# Patient Record
Sex: Male | Born: 1945 | Race: White | Hispanic: No | Marital: Married | Smoking: Former smoker
Health system: Southern US, Community
[De-identification: ages and names within clinical notes are randomized; demographics above are authoritative.]

## PROBLEM LIST (undated history)

## (undated) DIAGNOSIS — J40 Bronchitis, not specified as acute or chronic: Secondary | ICD-10-CM

## (undated) DIAGNOSIS — M1990 Unspecified osteoarthritis, unspecified site: Secondary | ICD-10-CM

## (undated) DIAGNOSIS — R7303 Prediabetes: Secondary | ICD-10-CM

## (undated) DIAGNOSIS — T4145XA Adverse effect of unspecified anesthetic, initial encounter: Secondary | ICD-10-CM

## (undated) DIAGNOSIS — R079 Chest pain, unspecified: Secondary | ICD-10-CM

## (undated) DIAGNOSIS — E669 Obesity, unspecified: Secondary | ICD-10-CM

## (undated) DIAGNOSIS — M5030 Other cervical disc degeneration, unspecified cervical region: Secondary | ICD-10-CM

## (undated) DIAGNOSIS — F32A Depression, unspecified: Secondary | ICD-10-CM

## (undated) DIAGNOSIS — I341 Nonrheumatic mitral (valve) prolapse: Secondary | ICD-10-CM

## (undated) DIAGNOSIS — J45909 Unspecified asthma, uncomplicated: Secondary | ICD-10-CM

## (undated) DIAGNOSIS — I493 Ventricular premature depolarization: Secondary | ICD-10-CM

## (undated) DIAGNOSIS — G4730 Sleep apnea, unspecified: Secondary | ICD-10-CM

## (undated) DIAGNOSIS — J069 Acute upper respiratory infection, unspecified: Secondary | ICD-10-CM

## (undated) DIAGNOSIS — F329 Major depressive disorder, single episode, unspecified: Secondary | ICD-10-CM

## (undated) DIAGNOSIS — I959 Hypotension, unspecified: Secondary | ICD-10-CM

## (undated) DIAGNOSIS — M5020 Other cervical disc displacement, unspecified cervical region: Secondary | ICD-10-CM

## (undated) DIAGNOSIS — R251 Tremor, unspecified: Secondary | ICD-10-CM

## (undated) DIAGNOSIS — N400 Enlarged prostate without lower urinary tract symptoms: Secondary | ICD-10-CM

## (undated) DIAGNOSIS — G4700 Insomnia, unspecified: Secondary | ICD-10-CM

## (undated) HISTORY — DX: Obesity, unspecified: E66.9

## (undated) HISTORY — PX: COLONOSCOPY W/ POLYPECTOMY: SHX1380

## (undated) HISTORY — PX: JOINT REPLACEMENT: SHX530

## (undated) HISTORY — PX: POLYPECTOMY: SHX149

## (undated) HISTORY — DX: Tremor, unspecified: R25.1

## (undated) HISTORY — PX: KNEE ARTHROTOMY: SUR107

## (undated) HISTORY — PX: CARDIAC CATHETERIZATION: SHX172

## (undated) HISTORY — DX: Ventricular premature depolarization: I49.3

## (undated) HISTORY — DX: Unspecified asthma, uncomplicated: J45.909

## (undated) HISTORY — DX: Nonrheumatic mitral (valve) prolapse: I34.1

## (undated) HISTORY — DX: Chest pain, unspecified: R07.9

## (undated) HISTORY — PX: KNEE ARTHROSCOPY: SUR90

---

## 1995-08-03 ENCOUNTER — Encounter: Payer: Self-pay | Admitting: Internal Medicine

## 1995-10-16 ENCOUNTER — Encounter: Payer: Self-pay | Admitting: Internal Medicine

## 2001-03-12 ENCOUNTER — Encounter: Admission: RE | Admit: 2001-03-12 | Discharge: 2001-06-10 | Payer: Self-pay | Admitting: Endocrinology

## 2002-04-23 ENCOUNTER — Encounter: Payer: Self-pay | Admitting: Orthopedic Surgery

## 2002-04-29 ENCOUNTER — Inpatient Hospital Stay (HOSPITAL_COMMUNITY): Admission: RE | Admit: 2002-04-29 | Discharge: 2002-05-03 | Payer: Self-pay | Admitting: Orthopedic Surgery

## 2003-02-24 ENCOUNTER — Inpatient Hospital Stay (HOSPITAL_COMMUNITY): Admission: RE | Admit: 2003-02-24 | Discharge: 2003-02-28 | Payer: Self-pay | Admitting: Orthopedic Surgery

## 2005-11-21 ENCOUNTER — Ambulatory Visit (HOSPITAL_COMMUNITY): Admission: RE | Admit: 2005-11-21 | Discharge: 2005-11-21 | Payer: Self-pay | Admitting: *Deleted

## 2005-11-21 ENCOUNTER — Encounter (INDEPENDENT_AMBULATORY_CARE_PROVIDER_SITE_OTHER): Payer: Self-pay | Admitting: Specialist

## 2006-03-14 HISTORY — PX: GASTRIC BYPASS: SHX52

## 2006-11-17 ENCOUNTER — Ambulatory Visit: Admission: RE | Admit: 2006-11-17 | Discharge: 2006-11-17 | Payer: Self-pay | Admitting: Endocrinology

## 2006-11-24 ENCOUNTER — Encounter: Admission: RE | Admit: 2006-11-24 | Discharge: 2006-11-24 | Payer: Self-pay | Admitting: Endocrinology

## 2006-11-30 ENCOUNTER — Ambulatory Visit: Payer: Self-pay | Admitting: Pulmonary Disease

## 2006-12-10 ENCOUNTER — Ambulatory Visit (HOSPITAL_BASED_OUTPATIENT_CLINIC_OR_DEPARTMENT_OTHER): Admission: RE | Admit: 2006-12-10 | Discharge: 2006-12-10 | Payer: Self-pay | Admitting: Pulmonary Disease

## 2006-12-15 ENCOUNTER — Encounter: Admission: RE | Admit: 2006-12-15 | Discharge: 2006-12-15 | Payer: Self-pay | Admitting: *Deleted

## 2006-12-22 ENCOUNTER — Ambulatory Visit (HOSPITAL_COMMUNITY): Admission: RE | Admit: 2006-12-22 | Discharge: 2006-12-22 | Payer: Self-pay | Admitting: *Deleted

## 2006-12-23 ENCOUNTER — Ambulatory Visit: Payer: Self-pay | Admitting: Pulmonary Disease

## 2006-12-26 DIAGNOSIS — G4733 Obstructive sleep apnea (adult) (pediatric): Secondary | ICD-10-CM | POA: Insufficient documentation

## 2006-12-26 DIAGNOSIS — Z9989 Dependence on other enabling machines and devices: Secondary | ICD-10-CM

## 2007-03-04 ENCOUNTER — Observation Stay (HOSPITAL_COMMUNITY): Admission: EM | Admit: 2007-03-04 | Discharge: 2007-03-05 | Payer: Self-pay | Admitting: Emergency Medicine

## 2007-03-04 ENCOUNTER — Ambulatory Visit: Payer: Self-pay | Admitting: Internal Medicine

## 2008-01-14 ENCOUNTER — Encounter: Admission: RE | Admit: 2008-01-14 | Discharge: 2008-01-14 | Payer: Self-pay | Admitting: Endocrinology

## 2008-01-24 ENCOUNTER — Encounter: Admission: RE | Admit: 2008-01-24 | Discharge: 2008-01-24 | Payer: Self-pay | Admitting: Endocrinology

## 2009-04-15 ENCOUNTER — Inpatient Hospital Stay (HOSPITAL_COMMUNITY): Admission: RE | Admit: 2009-04-15 | Discharge: 2009-04-18 | Payer: Self-pay | Admitting: Orthopedic Surgery

## 2010-03-16 ENCOUNTER — Encounter: Payer: Self-pay | Admitting: Pulmonary Disease

## 2010-04-15 NOTE — Letter (Signed)
Summary: CMN request for CPAP Supplies/[facility]  CMN request for CPAP Supplies/[facility]   Imported By: Sherian Rein 03/24/2010 15:14:57  _____________________________________________________________________  External Attachment:    Type:   Image     Comment:   External Document

## 2010-05-30 LAB — URINALYSIS, ROUTINE W REFLEX MICROSCOPIC
Glucose, UA: >1000 mg/dL — AB
Hgb urine dipstick: NEGATIVE
Ketones, ur: NEGATIVE mg/dL
Leukocytes, UA: NEGATIVE
Specific Gravity, Urine: 1.024 (ref 1.005–1.030)
Urobilinogen, UA: 1 mg/dL (ref 0.0–1.0)
pH: 5.5 (ref 5.0–8.0)

## 2010-05-30 LAB — APTT: aPTT: 28 seconds (ref 24–37)

## 2010-05-30 LAB — CBC
MCHC: 33.7 g/dL (ref 30.0–36.0)
RDW: 12.2 % (ref 11.5–15.5)

## 2010-05-30 LAB — COMPREHENSIVE METABOLIC PANEL
AST: 30 U/L (ref 0–37)
BUN: 11 mg/dL (ref 6–23)
CO2: 29 mEq/L (ref 19–32)
Calcium: 8.9 mg/dL (ref 8.4–10.5)
Chloride: 100 mEq/L (ref 96–112)
Glucose, Bld: 312 mg/dL — ABNORMAL HIGH (ref 70–99)
Sodium: 139 mEq/L (ref 135–145)
Total Bilirubin: 0.8 mg/dL (ref 0.3–1.2)
Total Protein: 7 g/dL (ref 6.0–8.3)

## 2010-05-30 LAB — PROTIME-INR: INR: 1.05 (ref 0.00–1.49)

## 2010-05-30 LAB — URINE MICROSCOPIC-ADD ON

## 2010-06-02 LAB — GLUCOSE, CAPILLARY
Glucose-Capillary: 165 mg/dL — ABNORMAL HIGH (ref 70–99)
Glucose-Capillary: 201 mg/dL — ABNORMAL HIGH (ref 70–99)
Glucose-Capillary: 203 mg/dL — ABNORMAL HIGH (ref 70–99)
Glucose-Capillary: 205 mg/dL — ABNORMAL HIGH (ref 70–99)
Glucose-Capillary: 207 mg/dL — ABNORMAL HIGH (ref 70–99)
Glucose-Capillary: 211 mg/dL — ABNORMAL HIGH (ref 70–99)
Glucose-Capillary: 230 mg/dL — ABNORMAL HIGH (ref 70–99)
Glucose-Capillary: 234 mg/dL — ABNORMAL HIGH (ref 70–99)
Glucose-Capillary: 235 mg/dL — ABNORMAL HIGH (ref 70–99)

## 2010-06-02 LAB — BASIC METABOLIC PANEL
BUN: 5 mg/dL — ABNORMAL LOW (ref 6–23)
CO2: 29 mEq/L (ref 19–32)
Calcium: 7.8 mg/dL — ABNORMAL LOW (ref 8.4–10.5)
Calcium: 8 mg/dL — ABNORMAL LOW (ref 8.4–10.5)
Chloride: 101 mEq/L (ref 96–112)
Creatinine, Ser: 0.83 mg/dL (ref 0.4–1.5)
Creatinine, Ser: 0.85 mg/dL (ref 0.4–1.5)
GFR calc Af Amer: >60 mL/min (ref >60)
GFR calc non Af Amer: >60 mL/min (ref >60)
GFR calc non Af Amer: >60 mL/min (ref >60)
Glucose, Bld: 193 mg/dL — ABNORMAL HIGH (ref 70–99)
Potassium: 3.6 mEq/L (ref 3.5–5.1)
Potassium: 3.8 mEq/L (ref 3.5–5.1)
Potassium: 4.2 mEq/L (ref 3.5–5.1)
Sodium: 134 mEq/L — ABNORMAL LOW (ref 135–145)
Sodium: 137 mEq/L (ref 135–145)

## 2010-06-02 LAB — PROTIME-INR
INR: 1.82 — ABNORMAL HIGH (ref 0.00–1.49)
Prothrombin Time: 20.9 seconds — ABNORMAL HIGH (ref 11.6–15.2)
Prothrombin Time: 22.3 seconds — ABNORMAL HIGH (ref 11.6–15.2)

## 2010-06-02 LAB — CBC
HCT: 28.7 % — ABNORMAL LOW (ref 39.0–52.0)
HCT: 29 % — ABNORMAL LOW (ref 39.0–52.0)
HCT: 29.5 % — ABNORMAL LOW (ref 39.0–52.0)
Hemoglobin: 10 g/dL — ABNORMAL LOW (ref 13.0–17.0)
Hemoglobin: 10.4 g/dL — ABNORMAL LOW (ref 13.0–17.0)
MCHC: 35.1 g/dL (ref 30.0–36.0)
Platelets: 149 10*3/uL — ABNORMAL LOW (ref 150–400)
RBC: 3.17 MIL/uL — ABNORMAL LOW (ref 4.22–5.81)
RBC: 3.25 MIL/uL — ABNORMAL LOW (ref 4.22–5.81)
RDW: 11.7 % (ref 11.5–15.5)
WBC: 5.5 10*3/uL (ref 4.0–10.5)

## 2010-06-02 LAB — HEMOGLOBIN A1C: Mean Plasma Glucose: 186 mg/dL

## 2010-07-27 NOTE — Cardiovascular Report (Signed)
[name], [name]               ACCOUNT NO.:  [id number]   MEDICAL RECORD NO.:  [id number]          PATIENT TYPE:  OIB   LOCATION:  2899                         FACILITY:  [facility]   PHYSICIAN:  Darlin Priestly, MD  DATE OF BIRTH:  08/03/1945   DATE OF PROCEDURE:  12/22/2006  DATE OF DISCHARGE:  12/22/2006                            CARDIAC CATHETERIZATION   PROCEDURE:  1. Left heart catheterization.  2. Coronary angiogram.  3. Left ventriculogram.   ATTENDING PHYSICIAN:  Darlin Priestly, MD   COMPLICATIONS:  None.   INDICATIONS:  [name] is a 65 year old male patient Dr. Adela Lank  and Dr. Aggie Cosier with history of history of hypertension,  hyperlipidemia, obesity, status post cardiac catheterization in 2005  revealing 80% lesion in the third diagonals as well as 60% AV circumflex  lesion.  He is currently undergoing evaluation for possible bariatric  surgery and was noted have an abnormal EKG prompting a subsequent  cardiac CT revealing diffuse soft plaque in the proximal LAD.  He is now  referred for repeat catheterization to reassess his CAD prior to  possible bariatric surgery.   DESCRIPTION OF OPERATION:  After obtaining informed consent, the patient  was brought to the cardiac catheterization lab.  The right groin shaved,  prepped, and draped in sterile fashion.  Hemodynamic monitoring was  established. Using modified Seldinger, a #6-French arterial sheath was  inserted into the right femoral artery.  A 6-French diagnostic catheter  was used to perform diagnostic angiography.   Left main is a large vessel with no significant disease.   The LAD is a medium to large vessel which courses to the apex and gives  rise to three diagonal branches.  The LAD has no significant disease.   First and second diagonals are small vessels with no significant  disease.   The third diagonal is small to medium-size vessel, less than 2 mm, which  is noted to have a 95% mid  vessel stenosis with excellent distal flow.   The AV circumflex is a medium-size vessel coursing to the AV addition  and giving rise to three obtuse marginal branches.  The AV circumflex  has a 60% lesion after the takeoff of the second OM.   The first and third OMs are small to medium-size vessels with no  significant disease.   The second OM is a medium-size vessel which bifurcates distally with no  significant disease.   The right coronary is a large vessel which is dominant and gives rise to  a PDA as well as a posterolateral branch.  There is mild 30% mid RCA  lesion.  The PDA and posterolateral branches have no significant  disease.   Left ventricle reveals low-normal EF of approximately 50% with mild  anterolateral hypokinesis.   HEMODYNAMIC RESULTS:  Systemic arterial pressure 93/62, LV system  pressure 100/3, LVEDP of nine.   CONCLUSION:  1. Significant one-vessel coronary artery disease involving small      diagonal branch best treated medically.  2. Low-normal ejection fraction.      Darlin Priestly, MD  Electronically  Signed     RHM/MEDQ  D:  12/22/2006  T:  12/22/2006  Job:  [id number]   cc:   Brooke Bonito, M.D.  Jaclyn Prime. Lucas Mallow, M.D.

## 2010-07-27 NOTE — Assessment & Plan Note (Signed)
[facility]                             PULMONARY OFFICE NOTE   PELHAM, HENNICK                      MRN:          [id number]  DATE:11/30/2006                            DOB:          [birth year]    HISTORY OF PRESENT ILLNESS:  The patient is a very pleasant 65 year old  gentleman whom I have been asked to see for management of obstructive  sleep apnea. The patient was diagnosed with sleep apnea in 1996 and  managed by Dr.  Maple Hudson initially. I do not have those results available.  The patient states that he has been on CPAP at 13.5 cm of water since  that time. He is wearing the CPAP compliantly every night and feels that  he is doing very, very well with it. He has not had a new machine since  his original, but has kept up with mask changes. The patient typically  goes to bed between 10 and 11 and gets up at 6:30 a.m. to start his day.  He does not have a lot of awakenings. He works as a Designer, television/film set during  the day and has absolutely no daytime sleepiness. He is able to sit in  meetings and also work on mundane tasks during the day without  sleepiness. Of note, the patient states that his weight is up about ten  pounds over the past two years.   PAST MEDICAL HISTORY:  1. Hypertension.  2. Diabetes mellitus.  3. Dyslipidemia.  4. History of allergic rhinitis.  5. History of sleep apnea as stated above.  6. History of bilateral knee replacements.   CURRENT MEDICATIONS:  1. Glucovance 500 mg q.i.d.  2. Actos 45 mg daily.  3. Byetta 10 mg b.i.d.  4. Crestor 20 mg daily.  5. Micardis of unknown dose daily.  6. Valproic acid 2000 mg daily.  7. Lamictal 25 mg daily.  8. Concerta 36 mg daily.  9. Claritin p.r.n.   ALLERGIES:  No known drug allergies.   SOCIAL HISTORY:  He is married. He has a history of smoking two packs  per day for 15 years. He has not smoked since 1993.   FAMILY HISTORY:  Is remarkable for his father having heart  disease.  Otherwise, is noncontributory in first-degree relatives.   REVIEW OF SYSTEMS:  As per History of Present Illness. Also, see the  patient intake form documented in the chart.   PHYSICAL EXAMINATION:  GENERAL: He is an obese male in no acute  distress. Blood pressure 138/78, pulse 78. Temperature 98.1. He is 6  feet, 2 inches tall and weighs 344 pounds. O2 saturation on room air is  97%.  HEENT: Pupils equal, round and reactive to light and accommodation.  Extraocular muscles are intact. Nares: Are patent without discharge.  Oropharynx shows left tonsillar hypertrophy. Otherwise, is unremarkable.  NECK: Is large and difficult to assess for JVD, but there is no  lymphadenopathy. There is no palp thyromegaly.  CHEST: Is totally clear.  CARDIAC: Reveals regular rate and rhythm. No murmurs, rubs or gallops.  ABDOMEN: Soft and nontender with good bowel  sounds.  GENITAL, RECTAL, BREASTS: Was not done and not indicated.  LOWER EXTREMITIES: Are without edema. Good pulses distally. No calf  tenderness.  NEUROLOGIC: He was alert and oriented with no obvious motor deficits.   IMPRESSION:  History of obstructive sleep apnea for which he is on CPAP  and doing very, very well. His CPAP machine is 63 years old and  definitely needs to be replaced at this point in time as well as various  supplies. I also think because it has been such a long time since his  pressures have been looked at that we should do an auto titrate device  for two weeks with download to optimize his CPAP pressure.   PLAN:  1. Work on weight loss and the patient is being assessed by the [employer].  2. Will do an auto titration for the next two weeks with download, but      if this is not acceptable for optimization for the [facility] program we      will do a full CPAP titration in the sleep lab.  3. The patient will need a new CPAP machine as well as supplies and      mask.     Barbaraann Share,  MD,FCCP  Electronically Signed    [facility]/MedQ  DD: 12/07/2006  DT: 12/07/2006  Job #: [id number]   cc:   Brooke Bonito, M.D.

## 2010-07-27 NOTE — H&P (Signed)
[name], [name]               ACCOUNT NO.:  [id number]   MEDICAL RECORD NO.:  [id number]          PATIENT TYPE:  INP   LOCATION:  1429                         FACILITY:  [facility]   PHYSICIAN:  Gardiner Barefoot, MD    DATE OF BIRTH:  [birth year]   DATE OF ADMISSION:  03/03/2007  DATE OF DISCHARGE:                              HISTORY & PHYSICAL   PRIMARY CARE PHYSICIAN:  Brooke Bonito, M.D.   CHIEF COMPLAINT:  Abdominal pain.   HISTORY OF PRESENT ILLNESS:  This is a 65 year old male with a history  of diabetes, who 2 days ago woke up from sleep with abdominal pain and  diarrhea.  He states that he had several loose stools after that and  since that time has had intermittent urge for defecation with a little  bit of watery stool output.  He reports that his belly has become more  and more distended and he can hear bowel sounds.  He otherwise reports  no tenderness.  He reports that he has had no fever and no sick contacts  and has had several episodes of nonbloody emesis, also nonbiliary.  He  reports that he has never had this before.  The patient also reports  that he has never had abdominal surgery in the past.   PAST MEDICAL HISTORY:  Diabetes.   MEDICATIONS:  1. Crestor 20 mg p.o. daily.  2. Actos 45 mg daily.  3. Glucovance daily.  4. Byetta p.o. 10 mcg daily.   ALLERGIES:  No known drug allergies.   FAMILY HISTORY:  No history ulcerative colitis or Crohn's.   SOCIAL HISTORY:  Denies any smoking or drinking.   REVIEW OF SYSTEMS:  Negative except as in the history of present  illness.   PHYSICAL EXAM:  Temperature is 100.4, pulse is 87, respirations 18,  blood pressure is 136/82 and O2 saturation is 93% on room air.  GENERAL:  The patient is awake, alert and oriented x3 and appears in  mild distress.  HEENT: Anicteric.  CARDIOVASCULAR:  Regular rate and rhythm with no murmurs, rubs or  gallops.  LUNGS:  Clear to auscultation bilaterally.  ABDOMEN:  Notably  distended, positive bowel sounds, and nontender.  No  rebound or guarding.  Bowel sounds are mildly tympanic.  EXTREMITIES:  No cyanosis, clubbing or edema.   LABORATORY DATA:  Sodium 138, potassium 3.4, chloride 105, bicarb 23,  BUN 10, creatinine 0.91, glucose 153, albumin 3.7, AST 57, ALT 61.  UA  with positive wbc's 7-10.  Lipase 20.  WBC 5.3 with 61% neutrophils,  hemoglobin 19 and platelets 233.  X-ray with a gas and fluid-filled,  dilated colon, consider related to potential colitis without wall  thickening or free air.   ASSESSMENT/PLAN:  1. Diarrhea.  With the patient's minimal stool output but with      frequency as well as the findings on x-ray, consideration of a      pseudo-ileus versus a gastroenteritis versus other types of ileus.      He has never had abdominal surgery in the past but other etiologies  of a pseudo-ileus are his electrolyte abnormalities and cardiac      etiologies.  Therefore, we will the patient on telemetry and do      serial enzymes as well as replace his potassium.  Will also place      an NG tube for decompression.  We will check a CT scan to have him      get a better look at the colon and consider a pseudo-ileus and      start him on IV fluids.  2. Diabetes.  Will hold his diabetic medications and put him on      sliding-scale insulin.      Gardiner Barefoot, MD  Electronically Signed     RWC/MEDQ  D:  03/04/2007  T:  03/05/2007  Job:  [id number]

## 2010-07-27 NOTE — Discharge Summary (Signed)
[name], [name]               ACCOUNT NO.:  [id number]   MEDICAL RECORD NO.:  [id number]          PATIENT TYPE:  INP   LOCATION:  1429                         FACILITY:  [facility]   PHYSICIAN:  Ladell Pier, M.D.   DATE OF BIRTH:  1945-11-04   DATE OF ADMISSION:  03/03/2007  DATE OF DISCHARGE:  03/05/2007                               DISCHARGE SUMMARY   DISCHARGE DIAGNOSES:  1. Nausea, vomiting, diarrhea, colitis.  2. Diabetes.  3. Morbid obesity.  4. Abdominal pain.   DISCHARGE MEDICATIONS:  1. Crestor 20 mg nightly.  2. Actos 45 mg daily.  3. Glucovance daily.  4. Byetta 10 mcg daily.  5. Cipro 500 b.i.d. x2 days.  6. Vicodin one every 6 hours as needed p.r.n. for pain, #20 with zero      refills.   CONSULTANTS:  None.   PROCEDURES:  None.   FOLLOWUP:  The patient is to follow up with Dr. Juleen China in 1-2 weeks.   HISTORY OF PRESENT ILLNESS:  The patient is a 65 year old male with a  history of diabetes who for the past 2 days has been having abdominal  pain and diarrhea.  He states that he has had several loose stools with  severe abdominal pain radiating across his abdomen.  He reported having  several explosive diarrhea episode.  Please see admission note for  remainder of history, Past Medical History, Family History, Social  History, Medications, Allergies, Review of Systems per admission H&P.   PHYSICAL EXAMINATION ON DISCHARGE:  VITAL SIGNS:  Temperature 97.4,  pulse of 81, respirations 20, blood pressure 106/66, pulse oximetry 95%  on room air.  CBG [id number].  HEENT:  Head is normocephalic, atraumatic.  Pupils reactive to light.  Throat without erythema.  CARDIOVASCULAR:  Regular rate and rhythm.  LUNGS:  Clear bilaterally.  ABDOMEN:  Positive bowel sounds.  Obese.  EXTREMITIES:  Without edema.   HOSPITAL COURSE:  1. NAUSEA, VOMITING, DIARRHEA, ABDOMINAL PAIN:  The patient was      admitted to the hospital, given IV fluids, and made n.p.o..  Over      the  24 hours his symptoms greatly improved.  He had a CAT scan done      that showed dilated loops of bowel.  His abdomen on discharge was      nontender, nondistended, positive bowel sounds.  He was able to      tolerate clears without any nausea, vomiting.  He will advance his      diet from eating soft to regular over the next few days.  Also gave      a prescription for Cipro 500 b.i.d. for 2 days.   #2.  DIABETES:  Will resume his diabetes medications on discharge.  He  was treated for diabetes with Lantus and sliding scale while inpatient.   DISCHARGE LABORATORY DATA:  Sodium 138, potassium 3.7, chloride 103, CO2  29, glucose 196, BUN 4, creatinine 0.91.  WBC 4.5, hemoglobin 13.3,  platelets 193.   CT scan of the abdomen and pelvis showed no acute finding in the abdomen  and  moderate gaseous distention of the transverse colon.      Ladell Pier, M.D.  Electronically Signed     NJ/MEDQ  D:  03/05/2007  T:  03/05/2007  Job:  [id number]   cc:   Brooke Bonito, M.D.  Fax: [phone]

## 2010-07-27 NOTE — Procedures (Signed)
[name], [name] NO.:  [id number]   MEDICAL RECORD NO.:  [id number]          PATIENT TYPE:  OUT   LOCATION:  SLEEP CENTER                 FACILITY[facility]   PHYSICIAN:  Barbaraann Share, MD,FCCPDATE OF BIRTH:  [birth year]   DATE OF STUDY:  12/10/2006                            NOCTURNAL POLYSOMNOGRAM   REFERRING PHYSICIAN:  Darlin Priestly, MD   INDICATION FOR STUDY:  Hypersomnia with sleep apnea.  The patient  returns for CPAP optimization.   EPWORTH SLEEPINESS SCORE:  11.   SLEEP ARCHITECTURE:  The patient had total sleep time of 311 minutes  with decreased slow wave sleep as well as REM.  Sleep onset latency was  prolonged at 39 minutes and REM onset was fairly rapid at 40 minutes.  Sleep efficiency was decreased at 78%.   RESPIRATORY DATA:  The patient underwent CPAP titration with a medium  Comfort-Gel nasal mask. A chin strap was added later in the study for  oral venting.  The CPAP pressure was increased incrementally to treat  both snoring and respiratory events. At a final pressure of 14 CMW, the  patient had excellent control of both.   OXYGEN DATA:  There was O2 desaturation as low as 90%  prior to optimal  CPAP.   CARDIAC DATA:  Occasions PVCs but no clinically significant arrhythmias.   MOVEMENT-PARASOMNIA:  Small numbers of leg jerks with no significant  sleep obstruction.   IMPRESSIONS-RECOMMENDATIONS:  1. CPAP titration study reveals good control of previously diagnosed      obstructive sleep apnea with 14 cm CPAP pressure delivered by a      medium Comfort Gel nasal CPAP mask.  A chin strap was added for      oral venting.      Barbaraann Share, MD,FCCP  Diplomate, American Board of Sleep  Medicine  Electronically Signed     [facility]/MEDQ  D:  12/23/2006 14:00:39  T:  12/23/2006 21:56:17  Job:  [id number]

## 2010-07-30 NOTE — Op Note (Signed)
NAMEDARELLE, KINGS NO.:  [id number]   MEDICAL RECORD NO.:  [id number]          PATIENT TYPE:  AMB   LOCATION:  ENDO                         FACILITY:  [facility]   PHYSICIAN:  Georgiana Spinner, M.D.    DATE OF BIRTH:  1945-04-20   DATE OF PROCEDURE:  11/21/2005  DATE OF DISCHARGE:                                 OPERATIVE REPORT   PROCEDURE:  Colonoscopy.   INDICATIONS:  Rectal bleeding, colon polyp.   ANESTHESIA:  Demerol 70 mg and Versed 7.5 mg.   PROCEDURE:  With the patient mildly sedated in the left lateral decubitus  position, the rectal examination was performed which was unremarkable.  Subsequently, the Olympus videoscopic colonoscope was inserted into the  rectum and passed under direct vision to the cecum identified by crow's foot  of the cecum and ileocecal valve, both of which were photographed.  From  this point, the colonoscope was slowly withdrawn taking circumferential  views of colonic mucosa, stopping at approximately 30 cm from the anal verge  at which point a polyp was seen photographed and removed using snare cautery  technique, setting of 20/200 blended current.  Tissue was retrieved by  suctioning into the endoscope.  The endoscope was then withdrawn all the way  to the rectum which appeared normal on direct and showed hemorrhoids on  retroflexed view.  The endoscope was straightened and withdrawn.  The  patient's vital signs and pulse oximeter and stable.  The patient tolerated  procedure well without apparent complications.   FINDINGS:  Internal hemorrhoids and polyp approximately 1 cm in size at 30  cm from anal verge.   PLAN:  Await biopsy report.  The patient will call me for results and follow-  up with me as an outpatient.           ______________________________  Georgiana Spinner, M.D.     GMO/MEDQ  D:  11/21/2005  T:  11/21/2005  Job:  [id number]

## 2010-07-30 NOTE — Discharge Summary (Signed)
[name], [name]                         ACCOUNT NO.:  [id number]   MEDICAL RECORD NO.:  [id number]                   PATIENT TYPE:  INP   LOCATION:  0472                                 [facility]ACILITY:  [facility]   PHYSICIAN:  Ollen Gross, M.D.                 DATE OF BIRTH:  1945-04-01   DATE OF ADMISSION:  04/29/2002  DATE OF DISCHARGE:  05/03/2002                                 DISCHARGE SUMMARY   ADMITTING DIAGNOSES:  1. Osteoarthritis bilateral knees, right more symptomatic than left.  2. Obesity.  3. Type 2 diabetes mellitus.  4. Sleep apnea.   DISCHARGE DIAGNOSES:  1. Osteoarthritis right knee status post right total knee replacement     arthroplasty with hardware removal.  2. Osteoarthritis left knee status post intraarticular cortisone injection.  3. Obesity.  4. Type 2 diabetes mellitus.  5. Sleep apnea.  6. Abnormal liver function tests.  7. History of bipolar disorder.   PROCEDURE:  The patient was taken to the OR on April 29, 2002 and  underwent a right total knee replacement arthroplasty with hardware removal  of the right knee; also underwent an injection of cortisone into the left  knee.  Surgeon:  Dr. Ollen Gross; assistant:  Alexzandrew L. Julien Girt, P.A.-  C.  Surgery was done under general anesthesia.  Estimated blood loss was 200  mL.  Hemovac drain x1.  Tourniquet time 81 minutes at 350 mmHg.   CONSULTS:  Internal medicine - Dr. Nolon Rod Monguilod.   BRIEF HISTORY:  The patient is a 65 year old male with longstanding history  of progressive bilateral knee pain.  The right is more symptomatic than the  left, complaining with it for several years now.  It is to the point where  he can barely walk.  He has not been able to exercise and has subsequently  added a fair amount of weight, and he has developed some type 2 diabetes and  sleep apnea.  He is now to the point where he would like to have some  surgery done.  X-rays in the office demonstrate  end-stage tricompartmental  arthritis with both knees, bone-on-bone.  The right knee actually shows a  high tibial osteotomy with a staple about 1 cm below the joint line.  It is  felt he would be a good candidate for knee replacement.  Risks and benefits  discussed and the patient is subsequently admitted to the hospital for  surgery.   LABORATORY DATA:  CBC on admission:  Hemoglobin 14.3, hematocrit 40.5, white  cell count 5.2, red cell count 4.81.  Postoperative H&H 12.0 and 34.9.  H&H  is followed.  Last noted H&H 1.2 and 31.9.  Differential on the admission  CBC all within normal limits.  PT/PTT on admission 12.8 and 28 respectively  with an INR 0.9.  Serum protimes were followed; the last noted PT/INR 21.4  and 2.1.  Chem  panel on admission:  Elevated glucose of 247 - he is a known  diabetic.  Minimally elevated AST and minimally elevated ALT of 39 and 56  respectively.  Serial BMETs were followed.  Sodium did drop from 141 down to  132, was back up to 139.  Glucose improved from 247 down to 189.  The AST  remained the same on a follow-up chem panel  - 39; the ALT improved from 56  to 37.  Urinalysis only positive glucose, otherwise negative.  Blood group  and type A positive.  Doppler studies taken on May 02, 2002:  Right leg  - no evidence of DVT; left leg - no evidence of DVT; there was a Bakers cyst  measuring 1.11 cm x 2.32 cm behind the left knee.   EKG dated April 23, 2002:  Normal sinus rhythm, normal EKG; confirmed by  Dr. Jacinto Halim.  Chest x-ray dated April 23, 2002:  No active disease.   HOSPITAL COURSE:  The patient was admitted to [facility], taken to  the OR, underwent the above-stated procedure without complication.  The  patient tolerated the procedure well and was later transferred to the  recovery room and then to the orthopedic floor for continued postoperative  care.  Vital signs were followed.  Due to the patient's medical history,  medical  consult was called.  The patient was seen in consultation by Dr.  Elliot Gurney who noted that the patient had some elevated LFTs; they  were repeated.  His studies may be found in the laboratory data section of  this chart.  His diabetes medications were followed very closely by Dr.  Jamie Brookes and medications were adjusted throughout the hospital course.  He  did undergo a right total knee and a cortisone injection into the left knee.  Hemovac drain placed at the time of surgery was pulled on postoperative day  #2; the drain was left in on postoperative day #1.  Dressings were ordered  to the room and a dressing change was initiated on postoperative day #2, at  which point Hemovac drain was pulled.  Also, the pain pump which had been  placed at the time of surgery was also pulled on postoperative day #2.  He  was given 24-hours of postoperative IV antibiotics, placed on Coumadin for  DVT prophylaxis.  PT and OT were consulted to assist with gait training  ambulation.  The patient did progress very well with physical therapy and  was up ambulating approximately 80 feet by postoperative day #2, and then  100 feet by postoperative day #3.  The patient did undergo a cortisone  injection into the left knee also at the time of surgery.  His Glucovance  had to be increased during the hospital course.  CBGs did improve.  The  patient progressed very well with physical therapy to the point that on  May 03, 2002 he was minimal assist, improving with his mobility.  He  had been followed very closely by Dr. Jamie Brookes and his CBGs had improved to  the point where he was discharged home.   DISCHARGE PLAN:  1. The patient was discharged home on May 03, 2002.  2. Discharge diagnoses:  Please see above.  3. Discharge medications:  Coumadin, Percocet, Robaxin.  4. Diet:  ADA diet.  5. Activity:  Weightbearing as tolerated.  Home health PT and home health    nursing by [facility],  total knee protocol.  6. Follow-up:  Two  weeks from surgery.   DISPOSITION:  Home.   CONDITION ON DISCHARGE:  Improved.     Alexzandrew L. Julien Girt, P.A.              Ollen Gross, M.D.    ALP/MEDQ  D:  05/27/2002  T:  05/27/2002  Job:  [id number]   cc:   Rosanne Sack, M.D.  [address]  [place], [place] [address]  Fax: [phone]

## 2010-07-30 NOTE — H&P (Signed)
NAMERYER, ASATO                         ACCOUNT NO.:  [id number]   MEDICAL RECORD NO.:  [id number]                   PATIENT TYPE:  INP   LOCATION:  0472                                 FACILITY:  [facility]   PHYSICIAN:  Ollen Gross, M.D.                 DATE OF BIRTH:  March 19, 1945   DATE OF ADMISSION:  04/29/2002  DATE OF DISCHARGE:                                HISTORY & PHYSICAL   CHIEF COMPLAINT:  Bilateral knee pain, right more symptomatic than left.   HISTORY OF PRESENT ILLNESS:  The patient is a 65 year old male with a long-  standing history of progressive bilateral knee pain.  The right knee is more  symptomatic than the left at this time.  This has been ongoing for several  years now to the point where he can barely walk.  He is not able to  exercise, and has subsequently added a fair amount of weight.  Unfortunately  he has developed some type 2 diabetes and sleep apnea.  His knees hurt him  all the time, and he is having trouble sleeping.  He is at a point now where  he would like to have surgery.  He was seen in the office where x-rays  demonstrate end-stage tricompartmental arthritis of both knees and bone-on-  bone.  The right knee actually showed an old high tibial osteotomy with a  staple about 1 cm below the joint line.  It is felt he would be a good  candidate for knee replacement.  Risks and benefits of the procedure have  been discussed with the patient, and he has elected to proceed with surgery.   ALLERGIES:  No known drug allergies.   CURRENT MEDICATIONS:  1. Voltaren 75 mg p.o. b.i.d.  The patient has stopped Voltaren     preoperatively.  2. Zoloft 100 mg p.o. daily.  3. Actos 45 mg p.o. daily.  4. Glucovance 2.5/500 one p.o. b.i.d.  5. Concerta 54 mg one p.o. daily.  6. Trileptal 600 mg in the morning, 900 mg at night.   PAST MEDICAL HISTORY:  Sleep apnea for which he does use a home CPAP  machine, obesity, type 2 diabetes mellitus.   PAST  SURGICAL HISTORY:  Right knee arthroscopy, two right knee arthrotomies,  a high tibial osteotomy on the right knee approximately 10 years ago.  He  has had multiple left knee scopes.   FAMILY HISTORY:  Mother deceased at age 41, cause unknown.  Father deceased  at age 24 with a history of MI.   SOCIAL HISTORY:  Married.  No children.  Nonsmoker.  No alcohol.  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   REVIEW OF SYSTEMS:  GENERAL:  No fevers, chills, night sweats.  NEUROLOGIC:  No seizures or paralysis.  RESPIRATORY:  He does have some complaints of a  head cold.  No productive cough,  no hemoptysis, no shortness of breath.  CARDIOVASCULAR:  No chest pain, angina, or orthopnea.  GI:  No nausea,  vomiting, diarrhea, constipation.  GU:  No dysuria, hematuria, discharge.  MUSCULOSKELETAL:  Pertinent for that of the knees found in the history of  present illness.   PHYSICAL EXAMINATION:  VITAL SIGNS:  Pulse 84, respirations 12, blood  pressure 132/82.  GENERAL:  The patient is a 65 year old white male, large frame, obese, well-  developed, in no acute distress.  He is alert, oriented, cooperative, and  very pleasant at the time of exam.  HEENT:  Normocephalic and atraumatic.  Pupils round and reactive.  Oropharynx clear.  The patient is noted to wear glasses.  TMs are intact.  NECK:  Supple.  CHEST:  He has a large barrel-chested frame.  Distant breath sounds;  however, he is clear to auscultation anterior and posterior chest walls.  No  rhonchi, rales, or wheezing.  HEART:  Regular rate and rhythm.  No murmur.  S1 and S2 noted.  ABDOMEN:  Soft, nontender.  Bowel sounds present.  Round abdomen.  No  rebound.  RECTAL/BREASTS/GENITALIA:  Not done.  Not pertinent to present illness.  EXTREMITIES:  Limited to the right lower extremity.  Right knee shows range  of motion of lacking 5 degrees, with 95 degrees of flexion.  Varus  alignment.  Pulses noted to be intact.  He has a  previous incision of the  anterior medial knee.  Moderate crepitus noted on passive range of motion.   IMPRESSION:  1. Osteoarthritis of bilateral knees, right more symptomatic than left.  2. Obesity.  3. Type 2 diabetes mellitus.  4. Sleep apnea.   PLAN:  The patient will be admitted to [facility] to undergo a  right total knee replacement arthroplasty.  He will also undergo an  interarticular cortisone injection for the left knee.  Surgery will be  performed by Dr. Ollen Gross.     Alexzandrew L. Julien Girt, P.A.              Ollen Gross, M.D.    ALP/MEDQ  D:  05/02/2002  T:  05/03/2002  Job:  [id number]   cc:   Ollen Gross, M.D.  [address]  [place]  [place] [address]  Fax: [phone]

## 2010-07-30 NOTE — Op Note (Signed)
NAMEOTONIEL, MYHAND                         ACCOUNT NO.:  [id number]   MEDICAL RECORD NO.:  [id number]                   PATIENT TYPE:  INP   LOCATION:  X001                                 FAC[facility]  [facility]   PHYSICIAN:  Ollen Gross, M.D.                 DATE OF BIRTH:  03/02/1946   DATE OF PROCEDURE:  04/29/2002  DATE OF DISCHARGE:                                 OPERATIVE REPORT   PREOPERATIVE DIAGNOSES:  1. Osteoarthritis of right knee.  2. Osteoarthritis of left knee.   POSTOPERATIVE DIAGNOSIS:  Osteoarthritis of right knee.   PROCEDURE:  1. Right total knee arthroplasty with hardware removal.  2. Corticosteroid injection, left knee.   SURGEON:  Ollen Gross, M.D.   ASSISTANT:  Alexzandrew L. Julien Girt, P.A.   ANESTHESIA:  General.   ESTIMATED BLOOD LOSS:  200 cc.   DRAINS:  Hemovac x1.   TOURNIQUET TIME:  81 min after inflation of 50 mmHg.   COMPLICATIONS:  None.   CONDITION:  Stable to recovery.   BRIEF CLINICAL NOTE:  The patient is a 65 year old male with severe end-  stage osteoarthritis of both knees, with pain refractory to nonoperative  management.  The right knee is currently more symptomatic than the left.  He  has had a previous high tibial osteotomy on the right side, with intact  staples.  He presents now for right total knee arthroplasty, secondary to  intractable pain.   PROCEDURE IN DETAIL:  After the successful administration of general  anesthetic, a tourniquet was placed high on the right thigh and the right  lower extremity prepped and draped in the usual sterile fashion.  The  extremity was wrapped in Esmarch, knee flexed, tourniquet inflated to 350  mmHg.   He had a previous small medial parapatellar incision and transverse lateral  incision; they are both over 38 years old, and I decided it was safe to use  a standard incision.  A standard incision was made, after the tourniquet had  been inflated.  Skin was cut with a #10 blade  through the subcutaneous  tissue, to the level of the extensor mechanism.  We elevated the subfascial  flaps medially and then laterally.  Fresh blades were used to make a medial  parapatellar arthrotomy.  Soft tissue with the proximal and medial tibia  subperiosteally elevated to the joint line with a knife, and at the  semimembranous bursa with a curved osteotome.  I thought the tissue on the  lateral tibia was also a very elevated with attention being paid to avoid  the patellar tendon on a tibial tubercle.  The patellar was then everted,  the knee flexed 90 degrees, and the ACL and PCL remnants removed.   He had bone-on-bone change throughout the entire knee.  A drill was used to  create a starting hole in the distal femur, and the canal was irrigated.  A  5 degree right valgus alignment guide is placed, and referencing off the  posterior condyle the rotation is marked and a block pin to remove 11 mm off  the distal femur.  Distal femoral resection is made with an oscillating saw.  The sizer is placed, and size 6 is most appropriate.  We referenced rotation  off the epicondylar axis, and then placed the AP cutting block and made the  anterior and posterior cuts for a size 6.   The extramedullary tibial alignment guide is placed referencing proximally  at the medial aspect and a tibial tubercle, and distally along the second  metatarsal axis of the tibial press.  The block is pinned to remove 10 mm  off the nondeficient lateral side.  This could barely get down through the  tibial defect; thus we went an additional 2 mm in order to get to the bottom  of the tibial defect.  The tibial resection is made with an oscillating saw.  We encountered the staple, and thus I had to elevate the soft tissue up off  of the proximal and lateral tibia by making incision in the fascia just  lateral to the patellar tendon.  I was able to identify the staple, and the  superior portion is easily removed.   The barbed inferior portion remained  intact in the bone, as the superior portion broke from that.  There was no  protruding hardware at all, and this was very deep into the bone away from  where the prosthesis was going to be closed.  I decided to leave it in  place.  The remainder of the tibial cut is then made. A size 5 is the most  appropriate tibial component, and went ahead and prepared the proximal tibia  with the modular drill and keel punch.  We then completed the femoral  preparation with the intercondylar chamfer block and made those cuts.   Size fit to posterior stabilized femoral trial was size 5 mobile bearing  tibial trial, and a 12.5 posterior stabilized hurricane platform inserter  placed.  We went all the way up to a 17.5, at which point there was  excellent balance to varus and valgus stress in both flexion and extension.  We then everted the patella and measured the thickness to be  1. Took a freehand resection of 13, placed a 41 template.  Lug holes are     drilled.  Trial patella placed and it tracks normally.  The osteophytes     are then removed off the posterior femur, with the trials in place.  Bony     surfaces are prepared with pulsatile lavage and cement mixed.  Once ready     for implantation, the size 5 mobile bearing tibial tray size 6, posterior     stabilized femur and the 41 patella are cemented into place.  The patella     was held with a clip.  The 17.5 trial insert was placed.  The knee was     held in full extension and all extruded cement removed.  Once the cement     had fully hardened the permanent 17.5 mm posterior stabilized rotating     platform insert is placed.  Once again, we had excellent balance     throughout.  The wound was copiously irrigated with antibiotic solution     and the extensor mechanism is closed over a Hemovac drain with    interrupted #1 PDS.  The  tourniquet had been released a total time of 81     min.  Minor bleeding was  stopped with electrocautery.  The subcutaneous     was closed with interrupted 2-0 Vicryl subcuticular running 4-0 Monocryl.     The catheter for the Marcaine pain infusion pump is then placed in the     subcutaneous tissues.  Steri-Strips and a bulky sterile dressing are     applied.  He was placed into a knee immobilizer.  We then prepped the     left knee and gave an injection of 8 cc of lidocaine and 40 mg of Depo-     Medrol.  This was then cleansed and a Band-Aid placed.  The patient is     subsequently awakened and transported to the recovery room in stable     condition.                                               Ollen Gross, M.D.    FA/MEDQ  D:  04/29/2002  T:  04/29/2002  Job:  [id number]

## 2010-07-30 NOTE — Op Note (Signed)
[name], [name]                         ACCOUNT NO.:  [id number]   MEDICAL RECORD NO.:  [id number]                   PATIENT TYPE:  INP   LOCATION:  0006                                 FACILITY:  [facility]   PHYSICIAN:  Ollen Gross, M.D.                 DATE OF BIRTH:  07/29/1945   DATE OF PROCEDURE:  02/24/2003  DATE OF DISCHARGE:                                 OPERATIVE REPORT   PREOPERATIVE DIAGNOSIS:  Osteoarthritis, left knee.   POSTOPERATIVE DIAGNOSIS:  Osteoarthritis, left knee.   OPERATION PERFORMED:  Left total knee arthroplasty.   SURGEON:  Ollen Gross, M.D.   ASSISTANT:  [name], P.A.   ANESTHESIA:  General.   ESTIMATED BLOOD LOSS:  Minimal.   DRAINS:  Hemovac times one.   TOURNIQUET TIME:  60 minutes at 300 mmHg.   COMPLICATIONS:  None.   CONDITION:  Stable to recovery.   INDICATIONS FOR PROCEDURE:  [name] is a 65 year old male with severe end  stage osteoarthritis of the left knee with pain refractory to nonoperative  management.  He has had previous right total knee arthroplasty with  excellent result.  He presents now for left total knee arthroplasty.   DESCRIPTION OF PROCEDURE:  After the successful administration of general  anesthetic, a tourniquet was placed the left thigh and left lower extremity  prepped and draped in the usual sterile fashion.  Extremity was wrapped with  an Esmarch, knee flexed, tourniquet inflated to 300 mmHg.  A standard  midline incision was made with a 10 blade through the subcutaneous tissue to  the level of the extensor mechanism.  Fresh blade was used to make a medial  parapatellar arthrotomy in the soft tissue with proximal medial tibia  subperiosteally elevated at the joint line with a knife and through  semimembranosus with a curved osteotome.  The soft tissue of the proximal  lateral tibia was also elevated with attention being paid to avoid the  patellar tendon on tibial tubercle.  Patella was  everted and knee flexed 90  degrees.  ACL and PLC removed.  The drill was used to create a starting hole  in the distal femur.  Canal was irrigated and five degree left valgus  alignment guide placed.  Referencing off the posterior condyle's rotation  was marked and the block pin to remove 10 mm of the distal femur.  Distal  femoral resection was made with an oscillating saw.  The sizing block was  placed and size 5 was most appropriate.  A deep block was placed and the  anterior and posterior cuts made.  The rotation was marked off the  epicondylar axis.   Tibia subluxed forward and the menisci removed.  Extramedullary tibial  alignment guide was placed referencing proximally at the medial aspect of  the tibial tubercle and distally on the second metatarsal  axis and tibial  crest.  __________ 10  mm of the less sufficient lateral side.  Tibial  resection was made with an oscillating saw.  Size 5 was the most appropriate  tibial component and the proximal tibial was prepared with a modular drill  and keel punch.  The femoral preparation was then completed with the  intercondylar and chamfer cuts.   A trial size 5 posterior stabilized femur, size 5 mobile bearing tibial tray  and a 10 mm posterior stabilized rotating platform anterior trial placed  Full extension was achieved with excellent varus and valgus balance  throughout.  Full range of motion.  Patella was everted.  Thickness measured  to be 25 mm, free hand resection taken to 14 mm and a 41 template placed,  lug holes were drilled, and trial patella placed which tracked normally.  The osteophytes were then removed off the posterior femur with the femoral  trial in placed.  All trials were removed and the cut bone surfaces were  prepared with pulsatile lavage.  Cement was mixed and once ready for  implantation, the size 5 mobile bearing tibial tray, size 5 posterior  stabilized femur and 41 patellar and cemented into place.   Patella was held  with the clamp.  A 10 mm trial insertion was placed in the knee and held in  full extension.  All extruded cement removed.  Once the cement was fully  hardened and the permanent 10 mm posterior stabilizer rotating platform  inserted in place.  The wound was copiously irrigated with antibiotic  solution.  Extensor mechanism closed over a Hemovac drain with interrupted  #1 PDS.  Flexion against gravity __________ 30 degrees.  Tourniquet released  with total time of 60 minutes.  Subcutaneous tissue closed with interrupted  2-0 Vicryl and then the catheter for morphine pump was placed.  The pump was  then set .  The incision was then cleaned and dried.  Steri-Strips and a  bulky sterile dressing applied.  He was then placed into a knee immobilizer,  awakened and transported to recovery in stable condition.                                               Ollen Gross, M.D.    FA/MEDQ  D:  02/24/2003  T:  02/24/2003  Job:  [id number]

## 2010-07-30 NOTE — H&P (Signed)
NAME:  [name], [name]                         ACCOUNT NO.:  [id number]   MEDICAL RECORD NO.:  [id number]                   PATIENT TYPE:  INP   LOCATION:  0474                                 FACILITY:  [facility]   PHYSICIAN:  Loanne Drilling, M.D.             DATE OF BIRTH:  10-02-1945   DATE OF ADMISSION:  02/24/2003  DATE OF DISCHARGE:                                HISTORY & PHYSICAL   HISTORY OF PRESENT ILLNESS:  The patient is a 65 year old male well known to  Dr. Ollen Gross with a known history of bilateral knee arthritis. He has  successfully undergone a right total knee replacement arthroplasty earlier  this year. He has done extremely well with his right total knee and now  presents for continued pain and symptoms with his opposite knee. The left  knee is known to have severe end-stage arthritis. He has done well with the  right and felt to be an appropriate candidate for left total knee  arthroplasty.   ALLERGIES:  No known drug allergies.   CURRENT MEDICATIONS:  1. Concerta 54 mg daily.  2. Zoloft 250 mg daily.  3. Lithium 900 mg daily.  4. Crestor 10 mg daily.  5. Voltaren stopped prior to surgery.  6. Glucovance 2.5/500 b.i.d.  7. Actos 45 mg daily.   PAST MEDICAL HISTORY:  1. Obesity.  2. Sleep apnea.  3. Noninsulin-dependent diabetes mellitus.  4. Bipolar.   PAST SURGICAL HISTORY:  He has undergone multiple right knee arthroscopies,  recently a right total knee arthroplasty earlier this year, and prior to  that he had a right tibial osteotomy.  He has also undergone left  arthroscopies.   SOCIAL HISTORY:  He is married. He is a nonsmoker. No alcohol. No children.  His wife will be assisting with care after surgery.   FAMILY HISTORY:  Significant for father with heart disease; father is  deceased.   REVIEW OF SYSTEMS:  GENERAL: No fever, chills, or night sweats.  NEUROLOGIC:  No seizure, syncope, or paralysis. RESPIRATORY: No shortness of breath for  a  couple of months.  CARDIOVASCULAR: No chest pain, angina, or orthopnea.  GASTROINTESTINAL: No nausea, vomiting, diarrhea, or constipation.  GENITOURINARY: No dysuria, hematuria, or discharge. MUSCULOSKELETAL:  Pertinent to that of the knee found in the history of present illness.   PHYSICAL EXAMINATION:  VITAL SIGNS:  Pulse 68, respirations 12, blood  pressure 146/82.  GENERAL:  The patient is a 65 year old male, well nourished, well developed,  overweight, and in no acute distress. He is alert, oriented, and  cooperative.  HEENT:  Normocephalic and atraumatic.  Pupils are round and reactive. EOMs  are intact. Oropharynx is clear. He does have a permanent lower bridge,  nothing that is removable.  NECK:  Supple with no carotid bruits.  CHEST:  Clear to auscultation in the anterior and posterior chest wall. He  is somewhat of a barrel-chested  individual.  HEART:  Regular rate and rhythm. No murmurs. S1 and S2 noted.  ABDOMEN:  Soft, round, protuberant abdomen. Bowel sounds are present.  RECTAL/BREASTS/GENITALIA:  Not done, not pertinent to present illness.  EXTREMITIES:  Pertinent to left knee, shows marked crepitus, passive range  of motion 10-95 degrees. No effusion and no signs of infection.   IMPRESSION:  1. Osteoarthritis of the left knee.  2. Obesity.  3. Sleep apnea.  4. Noninsulin-dependent diabetes mellitus.  5. Bipolar.   PLAN:  The patient will be admitted to [facility] to undergo a  left total knee arthroplasty. Surgery is to be performed by Dr. Ollen Gross.     Alexzandrew Tessie Fass, P.A.-C           Loanne Drilling, M.D.    ALP/MEDQ  D:  02/27/2003  T:  02/28/2003  Job:  [id number]   cc:   Evelena Peat, M.D.  [address]  [place]  [place] [address]  Fax: [phone]

## 2010-07-30 NOTE — Discharge Summary (Signed)
[name], [name]                         ACCOUNT NO.:  [id number]   MEDICAL RECORD NO.:  [id number]                   PATIENT TYPE:  INP   LOCATION:  0474                                 FACILITY:  [facility]   PHYSICIAN:  Ollen Gross, M.D.                 DATE OF BIRTH:  [birth year]   DATE OF ADMISSION:  02/24/2003  DATE OF DISCHARGE:  02/28/2003                                 DISCHARGE SUMMARY   ADMISSION DIAGNOSES:  1. Osteoarthritis, left knee.  2. Obesity.  3. Sleep apnea.  4. Non-insulin dependent diabetes mellitus.  5. Bipolar disorder.   DISCHARGE DIAGNOSES:  1. Osteoarthritis, left knee, status post left total knee arthroplasty.  2. Mild postoperative blood loss anemia.  3. Obesity.  4. Sleep apnea.  5. Non-insulin dependent diabetes mellitus.  6. Bipolar disorder.   PROCEDURE:  On February 24, 2003, left total knee arthroplasty.  Surgeon was  Dr. Homero Fellers Aluisio.  Assistant was Avel Peace, P.A.-C.  Anesthesia was  general.  Minimal blood loss.  Hemovac drain x1.  Tourniquet time 60 minutes  at 300 mmHg.   CONSULTATIONS:  None.   HISTORY:  A 65 year old male with severe end-stage arthritis of the left  knee.  Pain refractory to non-operative management now presents for a total  knee arthroplasty.  Previously had a right total knee with excellent  results.   LABORATORY DATA:  CBC on admission:  Hemoglobin of 14.7, hematocrit of 43.4,  white cell count 6.7, red cell count 5.  Differential within normal limits.  Postoperative hemoglobin and hematocrit were 11.8 and 34.1.  Last noted  hemoglobin and hematocrit were 10.9 and 31.9.  PT and PTT preoperatively  were 13.4 and 30, respectively, with an INR of 1.  Serial prothrombin times  were followed per Coumadin protocol.  Last noted PT/INR 19.2 and 2.  Chem  panel on admission showed an elevated glucose of 217, elevated ALT of 46.  Serial BMETs were followed.  Glucose had drifted down to 155.  Calcium  dropped from  9.6 to 8.2.  Electrolytes within normal limits.  Preoperative  UA positive for glucose, otherwise negative.  Blood group type A positive.  Chest x-ray dated April 23, 2002, no active disease.  EKG dated April 23, 2002, normal sinus rhythm, normal EKG, confirmed by Dr. Jacinto Halim.   HOSPITAL COURSE:  The patient was admitted to [facility], taken to  the OR, underwent the above stated procedure without complication.  The  patient tolerated the procedure well, and later was transferred to the  recovery room and then to the orthopaedic floor for continued postoperative  care.  Vital signs were followed.  The patient was given 24 hours of  postoperative IV antibiotics with Ancef.  Started back on a CPAP machine  with continuous pulse ox and oxygen.  PT and OT consulted for gait training,  ambulation, and ADLs.  Weightbearing  as tolerated to the left lower  extremity.  Home medications were restarted.  Hemovac drain placed at time  of surgery was pulled on postoperative day #1.  The patient is doing pretty  well.  On the first evening did have some slight temperature that night,  treated with __________ and incentive spirometer.  By day #2, he was doing  much better.  PCA and IV were discontinued along with the Foley.  He started  to get up more with physical therapy and up ambulating approximately 70 feet  by day #2, and then gradually to 80 feet.  He continued to progress through  day #3, and by day #4, the patient is doing much better, __________ well,  was discharged home.   DISCHARGE PLAN:  Discharged home on February 28, 2003.   DISCHARGE DIAGNOSIS:  Please see above.   DISCHARGE MEDICATIONS:  1. Percocet.  2. Robaxin.  3. Coumadin.   DIET:  Diabetic diet.   FOLLOWUP:  Follow up on December 28 through the 30, 2004.  Call the office  for an appointment at [phone].   ACTIVITY:  Weightbearing as tolerated.  Home health physical therapy and  home health nursing through  [facility].  Total knee protocol.   CONDITION ON DISCHARGE:  Improving.     [name], P.A.              Ollen Gross, M.D.    ALP/MEDQ  D:  04/03/2003  T:  04/04/2003  Job:  [id number]   cc:   Evelena Peat, M.D.  [address]  [place]  [place] [address]  Fax: [phone]

## 2010-12-17 LAB — COMPREHENSIVE METABOLIC PANEL
ALT: 61 — ABNORMAL HIGH
Albumin: 2.9 — ABNORMAL LOW
BUN: 10
BUN: 10
CO2: 23
Calcium: 8.8
Creatinine, Ser: 0.95
GFR calc non Af Amer: >60
Glucose, Bld: 153 — ABNORMAL HIGH
Sodium: 138
Total Protein: 6

## 2010-12-17 LAB — URINALYSIS, ROUTINE W REFLEX MICROSCOPIC
Bilirubin Urine: NEGATIVE
Ketones, ur: 15 — AB
Nitrite: NEGATIVE
Protein, ur: NEGATIVE
Urobilinogen, UA: 0.2

## 2010-12-17 LAB — CK TOTAL AND CKMB (NOT AT ARMC)
CK, MB: 1.8
Relative Index: 0.6
Total CK: 325 — ABNORMAL HIGH

## 2010-12-17 LAB — DIFFERENTIAL
Basophils Absolute: 0
Basophils Relative: 0
Eosinophils Absolute: 0 — ABNORMAL LOW
Eosinophils Absolute: 0.1 — ABNORMAL LOW
Eosinophils Relative: 2
Lymphocytes Relative: 22
Lymphs Abs: 0.6 — ABNORMAL LOW
Lymphs Abs: 1.2
Monocytes Absolute: 0.8
Monocytes Relative: 12
Monocytes Relative: 15 — ABNORMAL HIGH
Neutro Abs: 3.2
Neutro Abs: 3.3
Neutrophils Relative %: 61
Neutrophils Relative %: 74

## 2010-12-17 LAB — CBC
HCT: 36.3 — ABNORMAL LOW
HCT: 43.3
Hemoglobin: 15.3
MCHC: 34.6
MCHC: 35.3
MCV: 85.6
MCV: 85.6
Platelets: 189
Platelets: 233
RBC: 5.05
RDW: 12.9
RDW: 13.1
RDW: 13.2
WBC: 5.3

## 2010-12-17 LAB — CARDIAC PANEL(CRET KIN+CKTOT+MB+TROPI)
CK, MB: 2
Total CK: 306 — ABNORMAL HIGH
Total CK: 372 — ABNORMAL HIGH
Troponin I: 0.03

## 2010-12-17 LAB — BASIC METABOLIC PANEL
BUN: 4 — ABNORMAL LOW
CO2: 29
Calcium: 8.2 — ABNORMAL LOW
Creatinine, Ser: 0.91
Glucose, Bld: 196 — ABNORMAL HIGH

## 2010-12-17 LAB — LIPASE, BLOOD: Lipase: 20

## 2010-12-17 LAB — URINE MICROSCOPIC-ADD ON

## 2010-12-17 LAB — TROPONIN I: Troponin I: 0.02

## 2010-12-24 LAB — BLOOD GAS, ARTERIAL
FIO2: 0.21
Patient temperature: 98.6
pH, Arterial: 7.425

## 2011-02-05 ENCOUNTER — Other Ambulatory Visit: Payer: Self-pay | Admitting: Orthopedic Surgery

## 2011-04-08 ENCOUNTER — Encounter (HOSPITAL_COMMUNITY): Payer: Self-pay

## 2011-04-18 ENCOUNTER — Other Ambulatory Visit: Payer: Self-pay | Admitting: Orthopedic Surgery

## 2011-04-18 ENCOUNTER — Other Ambulatory Visit: Payer: Self-pay

## 2011-04-18 ENCOUNTER — Encounter (HOSPITAL_COMMUNITY)
Admission: RE | Admit: 2011-04-18 | Discharge: 2011-04-18 | Disposition: A | Discharge status: Home / Self Care | Payer: BC Managed Care – PPO | Source: Ambulatory Visit | Attending: Orthopedic Surgery | Admitting: Orthopedic Surgery

## 2011-04-18 ENCOUNTER — Ambulatory Visit (HOSPITAL_COMMUNITY)
Admission: RE | Admit: 2011-04-18 | Discharge: 2011-04-18 | Disposition: A | Discharge status: Home / Self Care | Payer: BC Managed Care – PPO | Source: Ambulatory Visit | Attending: Orthopedic Surgery | Admitting: Orthopedic Surgery

## 2011-04-18 ENCOUNTER — Encounter (HOSPITAL_COMMUNITY): Payer: Self-pay

## 2011-04-18 DIAGNOSIS — R9431 Abnormal electrocardiogram [ECG] [EKG]: Secondary | ICD-10-CM | POA: Insufficient documentation

## 2011-04-18 DIAGNOSIS — Z01812 Encounter for preprocedural laboratory examination: Secondary | ICD-10-CM | POA: Insufficient documentation

## 2011-04-18 DIAGNOSIS — Z01810 Encounter for preprocedural cardiovascular examination: Secondary | ICD-10-CM | POA: Insufficient documentation

## 2011-04-18 DIAGNOSIS — Z01818 Encounter for other preprocedural examination: Secondary | ICD-10-CM | POA: Insufficient documentation

## 2011-04-18 HISTORY — DX: Major depressive disorder, single episode, unspecified: F32.9

## 2011-04-18 HISTORY — DX: Unspecified osteoarthritis, unspecified site: M19.90

## 2011-04-18 HISTORY — DX: Sleep apnea, unspecified: G47.30

## 2011-04-18 HISTORY — DX: Depression, unspecified: F32.A

## 2011-04-18 HISTORY — DX: Acute upper respiratory infection, unspecified: J06.9

## 2011-04-18 LAB — COMPREHENSIVE METABOLIC PANEL
Alkaline Phosphatase: 99 U/L (ref 39–117)
BUN: 9 mg/dL (ref 6–23)
Chloride: 104 mEq/L (ref 96–112)
GFR calc Af Amer: >90 mL/min (ref >90)
Glucose, Bld: 198 mg/dL — ABNORMAL HIGH (ref 70–99)
Potassium: 4.7 mEq/L (ref 3.5–5.1)
Total Bilirubin: 0.4 mg/dL (ref 0.3–1.2)

## 2011-04-18 LAB — APTT: aPTT: 29 seconds (ref 24–37)

## 2011-04-18 LAB — URINE MICROSCOPIC-ADD ON

## 2011-04-18 LAB — URINALYSIS, ROUTINE W REFLEX MICROSCOPIC
Bilirubin Urine: NEGATIVE
Hgb urine dipstick: NEGATIVE
Specific Gravity, Urine: 1.018 (ref 1.005–1.030)
Urobilinogen, UA: 1 mg/dL (ref 0.0–1.0)

## 2011-04-18 LAB — SURGICAL PCR SCREEN: Staphylococcus aureus: NEGATIVE

## 2011-04-18 LAB — CBC
HCT: 40.6 % (ref 39.0–52.0)
Hemoglobin: 13.7 g/dL (ref 13.0–17.0)
WBC: 7.1 10*3/uL (ref 4.0–10.5)

## 2011-04-18 LAB — PROTIME-INR: Prothrombin Time: 13.7 seconds (ref 11.6–15.2)

## 2011-04-18 MED ORDER — CHLORHEXIDINE GLUCONATE 4 % EX LIQD: 60.0000 mL | Freq: Once | CUTANEOUS | Status: DC

## 2011-04-18 NOTE — Patient Instructions (Signed)
[address]  04/18/2011   Your procedure is scheduled on: 04/22/11   Surgery   [id number]   Friday Report to [facility] at  0515 AM.  Call this number if you have problems the morning of surgery: [phone]   PST [phone]   Remember:             BRING CPAP MASK WITH YOU TO HOSPITAL  Do not eat food:After Midnight. Thursday night  May have clear liquids:until Midnight .Thursday night  Clear liquids include soda, tea, black coffee, apple or grape juice, broth.  Take these medicines the morning of surgery with A SIP OF WATER: CONCERTA,PROLISEC,PROPANOL WITH SIP WATER           NORCO OR TYLENOL WITH SIP WATER if needed              NO BLOOD SUGAR MEDS AM OF SURGERY  Do not wear jewelry, make-up or nail polish.  Do not wear lotions, powders, or perfumes. You may wear deodorant.  Do not shave 48 hours prior to surgery.  Do not bring valuables to the hospital.  Contacts, dentures or bridgework may not be worn into surgery.  Leave suitcase in the car. After surgery it may be brought to your room.  For patients admitted to the hospital, checkout time is 11:00 AM the day of discharge.   Patients discharged the day of surgery will not be allowed to drive home.  Name and phone number of your driver: wife Special Instructions: CHG Shower Use Special Wash: 1/2 bottle night before surgery and 1/2 bottle morning of surgery.  Regular soap face and privates   Please read over the following fact sheets that you were given: MRSA Information

## 2011-04-18 NOTE — Pre-Procedure Instructions (Signed)
Held on at [facility] page to inform of for abnormal urine report- notified Marchelle Folks L at desk who stated she would sent interdept note with flag to Dr Aluisio/Drew to inform them to check urine [phone]

## 2011-04-19 NOTE — Pre-Procedure Instructions (Signed)
No action on abnormal lab per Avel Peace PA

## 2011-04-20 NOTE — Pre-Procedure Instructions (Signed)
Late entry 04/19/11 no action re labs per Gareth Eagle PA

## 2011-04-21 ENCOUNTER — Other Ambulatory Visit: Payer: Self-pay | Admitting: Orthopedic Surgery

## 2011-04-21 NOTE — H&P (Signed)
[name]  DOB: 1945/08/02 Married / Language: English / Race: White / Male  Date of Admission: 04/22/2011  Chief Complaint:  Right Hip pain  History of Present Illness The patient is a 66 year old male who comes in today for a preoperative History and Physical. The patient is scheduled for a right total hip arthroplasty revision to be performed by Dr. Gus Rankin. Aluisio, MD at [facility] on 04/22/2011. [name] continues with the lateral sided pain and swelling. We did the aspiration and it helped for a short amount of time. We already discussed that the patient is going to need to get the bearing surface revised. I believe he has an issue between the trunnion of the femoral neck and the femoral head. He has a 40-mm head and these have been the ones that have caused the issues before. The patient had a fluid collection on the MRI scan. Symptoms reported were pain and swelling. The following medication has been used for pain control: antiinflammatory medication (motrin). The patient improved following aspiration (less restricted for 2-3 days). [name] states that the aspiration did help for a while. His pain has recurred. The patient stated he was on the back burner for a while, as his wife just had cardiac surgery. He wanted to hold off on anything for his hip for a while. The patient stateed that the hip pain was better than it was prior to the aspiration but that he still has discomfort laterally. All of his pain remains lateral. He is not getting any limp, popping, or catching going on. It is felt that he would benefit from undergoing exchanging the head to a smaller head. He is now ready to proceed with surgery. They have been treated conservatively in the past for the above stated problem and despite conservative measures, they continue to have progressive pain and severe functional limitations and dysfunction. It is felt that they would benefit from undergoing total hip  revision. Risks and benefits of the procedure have been discussed with the patient and they elect to proceed with surgery. There are no active contraindications to surgery such as ongoing infection or rapidly progressive neurological disease.  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 uses CPAP Non-Insulin Dependent Diabetes Mellitus Measles Mumps Rubella  Allergies No Known Drug Allergies  Family History Congestive Heart Failure. father Depression. mother Heart Disease. father Heart disease in male family member before age 38 Father. Deceased. age 37 Mother. Deceased. age 50  Social History Alcohol use. current drinker; drinks beer; less than 5 per week Children. 0 Current work status. working full time Drug/Alcohol Rehab (Currently). no Drug/Alcohol Rehab (Previously). no Exercise. Exercises daily; does running / walking Illicit drug use. no Living situation. live with spouse Marital status. married Number of flights of stairs before winded. 2-3 Pain Contract. no Tobacco / smoke exposure. no Tobacco use. Former smoker. former smoker; smoke(d) 1 pack(s) per day; uses less than half 1/2 can(s) smokeless per week Post-Surgical Plans. Plan is to go home.  Past Surgical History Total Hip Replacement. right Total Knee Replacement. bilateral   Review of Systems General:Not Present- Chills, Fever, Night Sweats, Appetite Loss, Fatigue, Feeling sick, Weight Gain and Weight Loss. Skin:Not Present- Itching, Rash, Skin Color Changes, Ulcer, Psoriasis and Change in Hair or Nails. HEENT:Present- Ringing in the Ears. Not Present- Sensitivity to light, Hearing problems and Nose Bleed. Neck:Not Present- Swollen Glands and Neck Mass. Respiratory:Not Present- Snoring, Chronic  Cough, Bloody sputum and Dyspnea. Cardiovascular:Not Present- Shortness of Breath, Chest Pain,  Swelling of Extremities, Leg Cramps and Palpitations. Gastrointestinal:Not Present- Bloody Stool, Heartburn, Abdominal Pain, Vomiting, Nausea and Incontinence of Stool. Male Genitourinary:Present- Weak urinary stream. Not Present- Blood in Urine, Frequency, Incontinence and Nocturia. Musculoskeletal:Present- Joint Stiffness and Joint Swelling. Not Present- Muscle Weakness, Muscle Pain, Joint Pain and Back Pain. Neurological:Not Present- Tingling, Numbness, Burning, Tremor, Headaches and Dizziness. Psychiatric:Present- Depression. Not Present- Anxiety and Memory Loss. Endocrine:Not Present- Cold Intolerance, Heat Intolerance, Excessive hunger and Excessive Thirst. Hematology:Not Present- Abnormal Bleeding, Anemia, Blood Clots and Easy Bruising.  Vitals Weight: 258 lb Height: 74 in Body Surface Area: 2.47 m Body Mass Index: 33.12 kg/m Pulse: 72 (Regular) Resp.: 14 (Unlabored) BP: 116/64 (Sitting, Right Arm, Standard)  Physical Exam The physical exam findings are as follows: Patient is a 66 year old male with continued hip pain and swelling.   General Mental Status - Alert, cooperative and good historian. General Appearance- pleasant. Not in acute distress. Orientation- Oriented X3. Build & Nutrition- Well nourished and Well developed.   Head and Neck Head- normocephalic, atraumatic . Neck Global Assessment- supple. no bruit auscultated on the right and no bruit auscultated on the left.   Eye Pupil- Bilateral- Regular and Round. Motion- Bilateral- EOMI.   Chest and Lung Exam Auscultation: Breath sounds:- clear at anterior chest wall and - clear at posterior chest wall. Adventitious sounds:- No Adventitious sounds.   Cardiovascular Auscultation:Rhythm- Regular rate and rhythm. Heart Sounds- S1 WNL and S2 WNL. Murmurs & Other Heart Sounds:Auscultation of the heart reveals - No Murmurs.   Abdomen Inspection:Contour- Generalized  mild distention. Palpation/Percussion:Tenderness- Abdomen is non-tender to palpation. Rigidity (guarding)- Abdomen is soft. Auscultation:Auscultation of the abdomen reveals - Bowel sounds normal.   Male Genitourinary Not done, not pertinent to present illness  Musculoskeletal On examination, well-developed male, alert and oriented, in no apparent distress. Examination of the right hip, flexed to about 100 degrees, rotated in 20, out 30, abducted 30 without discomfort.  IMAGES: I reviewed the patient's MRI scan again today showing both he and his wife. It showed the fluid collection but did not show any muscle damage at this point.  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 Lequita Halt.  PCP - Dr. Christena Flake, PA-C

## 2011-04-22 ENCOUNTER — Encounter (HOSPITAL_COMMUNITY): Payer: Self-pay | Admitting: *Deleted

## 2011-04-22 ENCOUNTER — Inpatient Hospital Stay (HOSPITAL_COMMUNITY)
Admission: RE | Admit: 2011-04-22 | Discharge: 2011-04-25 | DRG: 817 | Disposition: A | Discharge status: Home Health | Payer: BC Managed Care – PPO | Source: Ambulatory Visit | Attending: Orthopedic Surgery | Admitting: Orthopedic Surgery

## 2011-04-22 ENCOUNTER — Inpatient Hospital Stay (HOSPITAL_COMMUNITY): Payer: BC Managed Care – PPO

## 2011-04-22 ENCOUNTER — Encounter (HOSPITAL_COMMUNITY): Payer: Self-pay | Admitting: Anesthesiology

## 2011-04-22 ENCOUNTER — Encounter (HOSPITAL_COMMUNITY)
Admission: RE | Disposition: A | Discharge status: Home Health | Payer: Self-pay | Source: Ambulatory Visit | Attending: Orthopedic Surgery

## 2011-04-22 ENCOUNTER — Inpatient Hospital Stay (HOSPITAL_COMMUNITY): Payer: BC Managed Care – PPO | Admitting: Anesthesiology

## 2011-04-22 DIAGNOSIS — G4730 Sleep apnea, unspecified: Secondary | ICD-10-CM | POA: Diagnosis present

## 2011-04-22 DIAGNOSIS — Z87891 Personal history of nicotine dependence: Secondary | ICD-10-CM

## 2011-04-22 DIAGNOSIS — T84099A Other mechanical complication of unspecified internal joint prosthesis, initial encounter: Principal | ICD-10-CM | POA: Diagnosis present

## 2011-04-22 DIAGNOSIS — Z96649 Presence of unspecified artificial hip joint: Secondary | ICD-10-CM

## 2011-04-22 DIAGNOSIS — B029 Zoster without complications: Secondary | ICD-10-CM | POA: Diagnosis present

## 2011-04-22 DIAGNOSIS — H9319 Tinnitus, unspecified ear: Secondary | ICD-10-CM | POA: Diagnosis present

## 2011-04-22 DIAGNOSIS — E119 Type 2 diabetes mellitus without complications: Secondary | ICD-10-CM | POA: Diagnosis present

## 2011-04-22 DIAGNOSIS — F3289 Other specified depressive episodes: Secondary | ICD-10-CM | POA: Diagnosis present

## 2011-04-22 DIAGNOSIS — F329 Major depressive disorder, single episode, unspecified: Secondary | ICD-10-CM | POA: Diagnosis present

## 2011-04-22 DIAGNOSIS — Y831 Surgical operation with implant of artificial internal device as the cause of abnormal reaction of the patient, or of later complication, without mention of misadventure at the time of the procedure: Secondary | ICD-10-CM | POA: Diagnosis present

## 2011-04-22 DIAGNOSIS — Z96659 Presence of unspecified artificial knee joint: Secondary | ICD-10-CM

## 2011-04-22 DIAGNOSIS — M25559 Pain in unspecified hip: Secondary | ICD-10-CM | POA: Diagnosis present

## 2011-04-22 HISTORY — PX: TOTAL HIP REVISION: SHX763

## 2011-04-22 LAB — GRAM STAIN

## 2011-04-22 LAB — GLUCOSE, CAPILLARY
Glucose-Capillary: 289 mg/dL — ABNORMAL HIGH (ref 70–99)
Glucose-Capillary: 218 mg/dL — ABNORMAL HIGH (ref 70–99)

## 2011-04-22 LAB — TYPE AND SCREEN
ABO/RH(D): A POS
Antibody Screen: NEGATIVE

## 2011-04-22 SURGERY — TOTAL HIP REVISION
Anesthesia: General | Site: Hip | Laterality: Right | Wound class: Clean

## 2011-04-22 MED ORDER — LACTATED RINGERS IV SOLN: INTRAVENOUS | Status: DC

## 2011-04-22 MED ORDER — METHYLPHENIDATE HCL ER (OSM) 18 MG PO TBCR
54.0000 mg | EXTENDED_RELEASE_TABLET | Freq: Every day | ORAL | Status: DC
  Administered 2011-04-23 – 2011-04-25 (×3): 54 mg via ORAL
  Filled 2011-04-22 (×3): qty 3

## 2011-04-22 MED ORDER — RIVAROXABAN 10 MG PO TABS
10.0000 mg | ORAL_TABLET | Freq: Every day | ORAL | Status: DC
  Administered 2011-04-23 – 2011-04-25 (×3): 10 mg via ORAL
  Filled 2011-04-22 (×3): qty 1

## 2011-04-22 MED ORDER — DEXAMETHASONE SODIUM PHOSPHATE 10 MG/ML IJ SOLN
INTRAMUSCULAR | Status: DC | PRN
  Administered 2011-04-22: 10 mg via INTRAVENOUS

## 2011-04-22 MED ORDER — PROPOFOL 10 MG/ML IV BOLUS
INTRAVENOUS | Status: DC | PRN
  Administered 2011-04-22: 200 mg via INTRAVENOUS

## 2011-04-22 MED ORDER — FLEET ENEMA 7-19 GM/118ML RE ENEM: 1.0000 | ENEMA | Freq: Once | RECTAL | Status: AC | PRN

## 2011-04-22 MED ORDER — LACTATED RINGERS IV SOLN
INTRAVENOUS | Status: DC | PRN
  Administered 2011-04-22 (×3): via INTRAVENOUS

## 2011-04-22 MED ORDER — LIDOCAINE HCL (CARDIAC) 20 MG/ML IV SOLN
INTRAVENOUS | Status: DC | PRN
  Administered 2011-04-22: 50 mg via INTRAVENOUS

## 2011-04-22 MED ORDER — DEXAMETHASONE SODIUM PHOSPHATE 10 MG/ML IJ SOLN: 10.0000 mg | Freq: Once | INTRAMUSCULAR | Status: DC

## 2011-04-22 MED ORDER — MORPHINE SULFATE 2 MG/ML IJ SOLN
INTRAMUSCULAR | Status: AC
  Administered 2011-04-22: 2 mg via INTRAVENOUS
  Filled 2011-04-22: qty 1

## 2011-04-22 MED ORDER — PROMETHAZINE HCL 25 MG/ML IJ SOLN: 6.2500 mg | INTRAMUSCULAR | Status: DC | PRN

## 2011-04-22 MED ORDER — CEFAZOLIN SODIUM-DEXTROSE 2-3 GM-% IV SOLR
2.0000 g | Freq: Once | INTRAVENOUS | Status: AC
  Administered 2011-04-22: 2 g via INTRAVENOUS

## 2011-04-22 MED ORDER — FENTANYL CITRATE 0.05 MG/ML IJ SOLN
INTRAMUSCULAR | Status: DC | PRN
  Administered 2011-04-22 (×5): 50 ug via INTRAVENOUS
  Administered 2011-04-22: 100 ug via INTRAVENOUS

## 2011-04-22 MED ORDER — DUTASTERIDE 0.5 MG PO CAPS
0.5000 mg | ORAL_CAPSULE | Freq: Every day | ORAL | Status: DC
  Administered 2011-04-23 – 2011-04-25 (×3): 0.5 mg via ORAL
  Filled 2011-04-22 (×3): qty 1

## 2011-04-22 MED ORDER — PANTOPRAZOLE SODIUM 40 MG PO TBEC
40.0000 mg | DELAYED_RELEASE_TABLET | Freq: Every day | ORAL | Status: DC
  Administered 2011-04-22 – 2011-04-24 (×3): 40 mg via ORAL
  Filled 2011-04-22 (×4): qty 1

## 2011-04-22 MED ORDER — NEOSTIGMINE METHYLSULFATE 1 MG/ML IJ SOLN
INTRAMUSCULAR | Status: DC | PRN
  Administered 2011-04-22: 5 mg via INTRAVENOUS

## 2011-04-22 MED ORDER — GLYCOPYRROLATE 0.2 MG/ML IJ SOLN
INTRAMUSCULAR | Status: DC | PRN
  Administered 2011-04-22: .2 mg via INTRAVENOUS
  Administered 2011-04-22: .6 mg via INTRAVENOUS

## 2011-04-22 MED ORDER — BUPIVACAINE LIPOSOME 1.3 % IJ SUSP
INTRAMUSCULAR | Status: DC | PRN
  Administered 2011-04-22: 20 mL

## 2011-04-22 MED ORDER — MENTHOL 3 MG MT LOZG: 1.0000 | LOZENGE | OROMUCOSAL | Status: DC | PRN

## 2011-04-22 MED ORDER — ONDANSETRON HCL 4 MG PO TABS: 4.0000 mg | ORAL_TABLET | Freq: Four times a day (QID) | ORAL | Status: DC | PRN

## 2011-04-22 MED ORDER — BISACODYL 10 MG RE SUPP: 10.0000 mg | Freq: Every day | RECTAL | Status: DC | PRN

## 2011-04-22 MED ORDER — TAMSULOSIN HCL 0.4 MG PO CAPS
0.4000 mg | ORAL_CAPSULE | Freq: Every day | ORAL | Status: DC
  Administered 2011-04-23 – 2011-04-25 (×3): 0.4 mg via ORAL
  Filled 2011-04-22 (×3): qty 1

## 2011-04-22 MED ORDER — PHENOL 1.4 % MT LIQD: 1.0000 | OROMUCOSAL | Status: DC | PRN

## 2011-04-22 MED ORDER — ACETAMINOPHEN 650 MG RE SUPP: 650.0000 mg | Freq: Four times a day (QID) | RECTAL | Status: DC | PRN

## 2011-04-22 MED ORDER — INSULIN ASPART 100 UNIT/ML ~~LOC~~ SOLN
0.0000 [IU] | Freq: Three times a day (TID) | SUBCUTANEOUS | Status: DC
  Administered 2011-04-22 (×2): 8 [IU] via SUBCUTANEOUS
  Administered 2011-04-23 (×3): 3 [IU] via SUBCUTANEOUS
  Administered 2011-04-24: 5 [IU] via SUBCUTANEOUS
  Administered 2011-04-24: 3 [IU] via SUBCUTANEOUS
  Administered 2011-04-24: 2 [IU] via SUBCUTANEOUS
  Administered 2011-04-25: 3 [IU] via SUBCUTANEOUS
  Filled 2011-04-22: qty 3

## 2011-04-22 MED ORDER — DUTASTERIDE-TAMSULOSIN HCL 0.5-0.4 MG PO CAPS: 1.0000 | ORAL_CAPSULE | Freq: Every day | ORAL | Status: DC

## 2011-04-22 MED ORDER — MIDAZOLAM HCL 5 MG/5ML IJ SOLN
INTRAMUSCULAR | Status: DC | PRN
  Administered 2011-04-22: 2 mg via INTRAVENOUS

## 2011-04-22 MED ORDER — LITHIUM CARBONATE ER 450 MG PO TBCR
1350.0000 mg | EXTENDED_RELEASE_TABLET | Freq: Every day | ORAL | Status: DC
  Administered 2011-04-22 – 2011-04-24 (×3): 1350 mg via ORAL
  Filled 2011-04-22 (×4): qty 3

## 2011-04-22 MED ORDER — METHOCARBAMOL 500 MG PO TABS
500.0000 mg | ORAL_TABLET | Freq: Four times a day (QID) | ORAL | Status: DC | PRN
  Administered 2011-04-22 – 2011-04-25 (×8): 500 mg via ORAL
  Filled 2011-04-22 (×8): qty 1

## 2011-04-22 MED ORDER — ACETAMINOPHEN 325 MG PO TABS: 650.0000 mg | ORAL_TABLET | Freq: Four times a day (QID) | ORAL | Status: DC | PRN

## 2011-04-22 MED ORDER — ONDANSETRON HCL 4 MG/2ML IJ SOLN: 4.0000 mg | Freq: Four times a day (QID) | INTRAMUSCULAR | Status: DC | PRN

## 2011-04-22 MED ORDER — ACETAMINOPHEN 10 MG/ML IV SOLN
1000.0000 mg | Freq: Once | INTRAVENOUS | Status: AC
  Administered 2011-04-22: 1000 mg via INTRAVENOUS

## 2011-04-22 MED ORDER — METFORMIN HCL 500 MG PO TABS
1000.0000 mg | ORAL_TABLET | Freq: Every day | ORAL | Status: DC
  Administered 2011-04-23 – 2011-04-25 (×3): 1000 mg via ORAL
  Filled 2011-04-22 (×3): qty 2

## 2011-04-22 MED ORDER — FENTANYL CITRATE 0.05 MG/ML IJ SOLN
25.0000 ug | INTRAMUSCULAR | Status: DC | PRN
  Administered 2011-04-22 (×2): 50 ug via INTRAVENOUS

## 2011-04-22 MED ORDER — ACETAMINOPHEN 10 MG/ML IV SOLN
1000.0000 mg | Freq: Four times a day (QID) | INTRAVENOUS | Status: AC
  Administered 2011-04-22 – 2011-04-23 (×4): 1000 mg via INTRAVENOUS
  Filled 2011-04-22 (×4): qty 100

## 2011-04-22 MED ORDER — BUPIVACAINE LIPOSOME 1.3 % IJ SUSP
20.0000 mL | Freq: Once | INTRAMUSCULAR | Status: DC
  Filled 2011-04-22: qty 20

## 2011-04-22 MED ORDER — DOCUSATE SODIUM 100 MG PO CAPS
100.0000 mg | ORAL_CAPSULE | Freq: Two times a day (BID) | ORAL | Status: DC
Start: 2011-04-22 — End: 2011-04-25
  Administered 2011-04-22 – 2011-04-25 (×7): 100 mg via ORAL
  Filled 2011-04-22 (×8): qty 1

## 2011-04-22 MED ORDER — SODIUM CHLORIDE 0.9 % IV SOLN: INTRAVENOUS | Status: DC

## 2011-04-22 MED ORDER — ONDANSETRON HCL 4 MG/2ML IJ SOLN
INTRAMUSCULAR | Status: DC | PRN
  Administered 2011-04-22: 4 mg via INTRAVENOUS

## 2011-04-22 MED ORDER — METOCLOPRAMIDE HCL 5 MG/ML IJ SOLN: 5.0000 mg | Freq: Three times a day (TID) | INTRAMUSCULAR | Status: DC | PRN

## 2011-04-22 MED ORDER — SUCCINYLCHOLINE CHLORIDE 20 MG/ML IJ SOLN
INTRAMUSCULAR | Status: DC | PRN
  Administered 2011-04-22: 100 mg via INTRAVENOUS

## 2011-04-22 MED ORDER — METOCLOPRAMIDE HCL 10 MG PO TABS: 5.0000 mg | ORAL_TABLET | Freq: Three times a day (TID) | ORAL | Status: DC | PRN

## 2011-04-22 MED ORDER — SODIUM CHLORIDE 0.9 % IV SOLN
INTRAVENOUS | Status: DC
  Administered 2011-04-22 – 2011-04-24 (×3): via INTRAVENOUS

## 2011-04-22 MED ORDER — EPHEDRINE SULFATE 50 MG/ML IJ SOLN
INTRAMUSCULAR | Status: DC | PRN
  Administered 2011-04-22 (×3): 10 mg via INTRAVENOUS

## 2011-04-22 MED ORDER — POLYETHYLENE GLYCOL 3350 17 G PO PACK
17.0000 g | PACK | Freq: Every day | ORAL | Status: DC | PRN
  Filled 2011-04-22: qty 1

## 2011-04-22 MED ORDER — OXYCODONE HCL 5 MG PO TABS
5.0000 mg | ORAL_TABLET | ORAL | Status: DC | PRN
  Administered 2011-04-22 – 2011-04-23 (×4): 10 mg via ORAL
  Administered 2011-04-23: 5 mg via ORAL
  Administered 2011-04-23 – 2011-04-24 (×5): 10 mg via ORAL
  Filled 2011-04-22 (×10): qty 2

## 2011-04-22 MED ORDER — MORPHINE SULFATE 2 MG/ML IJ SOLN
1.0000 mg | INTRAMUSCULAR | Status: DC | PRN
  Administered 2011-04-22 – 2011-04-23 (×3): 2 mg via INTRAVENOUS
  Filled 2011-04-22 (×3): qty 1

## 2011-04-22 MED ORDER — ZOLPIDEM TARTRATE 10 MG PO TABS
10.0000 mg | ORAL_TABLET | Freq: Every day | ORAL | Status: DC
  Administered 2011-04-22 – 2011-04-24 (×3): 10 mg via ORAL
  Filled 2011-04-22 (×3): qty 1

## 2011-04-22 MED ORDER — CEFAZOLIN SODIUM 1-5 GM-% IV SOLN
1.0000 g | Freq: Four times a day (QID) | INTRAVENOUS | Status: AC
  Administered 2011-04-22 – 2011-04-23 (×3): 1 g via INTRAVENOUS
  Filled 2011-04-22 (×3): qty 50

## 2011-04-22 MED ORDER — FENTANYL CITRATE 0.05 MG/ML IJ SOLN
INTRAMUSCULAR | Status: AC
  Filled 2011-04-22: qty 2

## 2011-04-22 MED ORDER — 0.9 % SODIUM CHLORIDE (POUR BTL) OPTIME
TOPICAL | Status: DC | PRN
  Administered 2011-04-22: 1000 mL

## 2011-04-22 MED ORDER — KETAMINE HCL 10 MG/ML IJ SOLN
INTRAMUSCULAR | Status: DC | PRN
  Administered 2011-04-22: 20 mg via INTRAVENOUS

## 2011-04-22 MED ORDER — METHOCARBAMOL 100 MG/ML IJ SOLN
500.0000 mg | Freq: Four times a day (QID) | INTRAVENOUS | Status: DC | PRN
  Administered 2011-04-22: 500 mg via INTRAVENOUS
  Filled 2011-04-22: qty 5

## 2011-04-22 MED ORDER — ROCURONIUM BROMIDE 100 MG/10ML IV SOLN
INTRAVENOUS | Status: DC | PRN
  Administered 2011-04-22: 10 mg via INTRAVENOUS
  Administered 2011-04-22: 40 mg via INTRAVENOUS

## 2011-04-22 MED ORDER — DIPHENHYDRAMINE HCL 12.5 MG/5ML PO ELIX
12.5000 mg | ORAL_SOLUTION | ORAL | Status: DC | PRN
Start: 2011-04-22 — End: 2011-04-25

## 2011-04-22 MED ORDER — PROPRANOLOL HCL 40 MG PO TABS
40.0000 mg | ORAL_TABLET | Freq: Two times a day (BID) | ORAL | Status: DC
  Administered 2011-04-22 – 2011-04-25 (×6): 40 mg via ORAL
  Filled 2011-04-22 (×8): qty 1

## 2011-04-22 SURGICAL SUPPLY — 56 items
BAG SPEC THK2 15X12 ZIP CLS (MISCELLANEOUS) ×1
BAG ZIPLOCK 12X15 (MISCELLANEOUS) ×4 IMPLANT
BIT DRILL 2.8X128 (BIT) ×2 IMPLANT
BLADE EXTENDED COATED 6.5IN (ELECTRODE) ×2 IMPLANT
BLADE SAW SAG 73X25 THK (BLADE)
BLADE SAW SGTL 73X25 THK (BLADE) ×1 IMPLANT
CLOSURE STERI STRIP 1/2 X4 (GAUZE/BANDAGES/DRESSINGS) ×1 IMPLANT
CLOTH BEACON ORANGE TIMEOUT ST (SAFETY) ×2 IMPLANT
CONT SPECI 4OZ STER CLIK (MISCELLANEOUS) IMPLANT
DRAPE INCISE IOBAN 66X45 STRL (DRAPES) ×2 IMPLANT
DRAPE ORTHO SPLIT 77X108 STRL (DRAPES) ×4
DRAPE POUCH INSTRU U-SHP 10X18 (DRAPES) ×2 IMPLANT
DRAPE SURG ORHT 6 SPLT 77X108 (DRAPES) ×2 IMPLANT
DRAPE U-SHAPE 47X51 STRL (DRAPES) ×2 IMPLANT
DRSG ADAPTIC 3X8 NADH LF (GAUZE/BANDAGES/DRESSINGS) ×2 IMPLANT
DRSG MEPILEX BORDER 4X4 (GAUZE/BANDAGES/DRESSINGS) ×2 IMPLANT
DRSG MEPILEX BORDER 4X8 (GAUZE/BANDAGES/DRESSINGS) ×2 IMPLANT
DURAPREP 26ML APPLICATOR (WOUND CARE) ×2 IMPLANT
ELECT REM PT RETURN 9FT ADLT (ELECTROSURGICAL) ×2
ELECTRODE REM PT RTRN 9FT ADLT (ELECTROSURGICAL) ×1 IMPLANT
EVACUATOR 1/8 PVC DRAIN (DRAIN) ×2 IMPLANT
FACESHIELD LNG OPTICON STERILE (SAFETY) ×8 IMPLANT
GLOVE BIO SURGEON STRL SZ7.5 (GLOVE) ×2 IMPLANT
GLOVE BIO SURGEON STRL SZ8 (GLOVE) ×2 IMPLANT
GLOVE BIOGEL PI IND STRL 8 (GLOVE) ×2 IMPLANT
GLOVE BIOGEL PI INDICATOR 8 (GLOVE) ×2
GOWN STRL NON-REIN LRG LVL3 (GOWN DISPOSABLE) ×2 IMPLANT
GOWN STRL REIN XL XLG (GOWN DISPOSABLE) ×2 IMPLANT
HEAD CERAMIC BIOLOX DELTA 36 6 (Hips) ×1 IMPLANT
IMMOBILIZER KNEE 20 (SOFTGOODS) ×2
IMMOBILIZER KNEE 20 THIGH 36 (SOFTGOODS) IMPLANT
KIT BASIN OR (CUSTOM PROCEDURE TRAY) ×2 IMPLANT
LINER MARATHON NEUT +4X60X36 (Hips) ×2 IMPLANT
MANIFOLD NEPTUNE II (INSTRUMENTS) ×2 IMPLANT
NDL SAFETY ECLIPSE 18X1.5 (NEEDLE) IMPLANT
NEEDLE HYPO 18GX1.5 SHARP (NEEDLE) ×2
NS IRRIG 1000ML POUR BTL (IV SOLUTION) ×2 IMPLANT
PACK TOTAL JOINT (CUSTOM PROCEDURE TRAY) ×2 IMPLANT
PASSER SUT SWANSON 36MM LOOP (INSTRUMENTS) ×2 IMPLANT
POSITIONER SURGICAL ARM (MISCELLANEOUS) ×2 IMPLANT
SPONGE GAUZE 4X4 12PLY (GAUZE/BANDAGES/DRESSINGS) ×2 IMPLANT
SPONGE LAP 18X18 X RAY DECT (DISPOSABLE) ×1 IMPLANT
STAPLER VISISTAT 35W (STAPLE) ×1 IMPLANT
STEM FEM SROM STD 17X22 36+12 (Hips) ×1 IMPLANT
SUCTION FRAZIER TIP 10 FR DISP (SUCTIONS) ×2 IMPLANT
SUT ETHIBOND NAB CT1 #1 30IN (SUTURE) ×4 IMPLANT
SUT VIC AB 1 CT1 27 (SUTURE) ×6
SUT VIC AB 1 CT1 27XBRD ANTBC (SUTURE) ×3 IMPLANT
SUT VIC AB 2-0 CT1 27 (SUTURE) ×6
SUT VIC AB 2-0 CT1 TAPERPNT 27 (SUTURE) ×3 IMPLANT
SWAB COLLECTION DEVICE MRSA (MISCELLANEOUS) ×2 IMPLANT
SYR 50ML LL SCALE MARK (SYRINGE) ×1 IMPLANT
TOWEL OR 17X26 10 PK STRL BLUE (TOWEL DISPOSABLE) ×4 IMPLANT
TRAY FOLEY CATH 14FRSI W/METER (CATHETERS) ×2 IMPLANT
TUBE ANAEROBIC SPECIMEN COL (MISCELLANEOUS) ×1 IMPLANT
WATER STERILE IRR 1500ML POUR (IV SOLUTION) ×2 IMPLANT

## 2011-04-22 NOTE — Anesthesia Preprocedure Evaluation (Signed)
Anesthesia Evaluation  Patient identified by MRN, date of birth, ID band Patient awake    Reviewed: Allergy & Precautions, H&P , NPO status , Patient's Chart, lab work & pertinent test results  Airway Mallampati: I TM Distance: >3 FB     Dental  (+) Teeth Intact, Caps, Poor Dentition and Dental Advisory Given,  Bridge across lower incisors:   Pulmonary sleep apnea and Continuous Positive Airway Pressure Ventilation , Recent URI , Resolved,  clear to auscultation  Pulmonary exam normal       Cardiovascular neg cardio ROS Regular Normal    Neuro/Psych Depression Negative Neurological ROS     GI/Hepatic negative GI ROS, Neg liver ROS,   Endo/Other  Negative Endocrine ROSDiabetes mellitus-, Well Controlled, Type 2, Oral Hypoglycemic Agents  Renal/GU negative Renal ROS  Genitourinary negative   Musculoskeletal negative musculoskeletal ROS (+)   Abdominal Normal abdominal exam  (+)   Peds negative pediatric ROS (+)  Hematology negative hematology ROS (+)   Anesthesia Other Findings   Reproductive/Obstetrics negative OB ROS                           Anesthesia Physical Anesthesia Plan  ASA: II  Anesthesia Plan: General   Post-op Pain Management:    Induction: Intravenous  Airway Management Planned: Oral ETT  Additional Equipment:   Intra-op Plan:   Post-operative Plan:   Informed Consent: I have reviewed the patients History and Physical, chart, labs and discussed the procedure including the risks, benefits and alternatives for the proposed anesthesia with the patient or authorized representative who has indicated his/her understanding and acceptance.   Dental advisory given  Plan Discussed with: CRNA  Anesthesia Plan Comments:         Anesthesia Quick Evaluation

## 2011-04-22 NOTE — Op Note (Signed)
[name], [name] NO.:  [id number]  MEDICAL RECORD NO.:  [id number]  LOCATION:  1609                         FACILITY:  [facility]  PHYSICIAN:  Ollen Gross, M.D.    DATE OF BIRTH:  May 11, 1945  DATE OF PROCEDURE:  04/22/2011 DATE OF DISCHARGE:                              OPERATIVE REPORT   PREOPERATIVE DIAGNOSIS:  Failed right total hip arthroplasty.  POSTOPERATIVE DIAGNOSIS:  Failed right total hip arthroplasty.  PROCEDURE:  Right total hip arthroplasty revision.  SURGEON:  Ollen Gross, M.D.  ASSISTANT:  Alexzandrew L. Perkins, P.A.C.  ANESTHESIA:  General.  ESTIMATED BLOOD LOSS:  200 mL.  DRAINS:  Hemovac x1.  COMPLICATIONS:  None.  CONDITION:  Stable to recovery.  BRIEF CLINICAL NOTE:  [name] is a 66 year old male who had a right total hip arthroplasty done approximately 2 years ago.  He did well initially, then started to develop significant lateral hip pain.  He had a metal on metal hip replacement with a 40 mm head.  He had a MRI, which showed a fluid collection around the hip.  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  He has had progressively worsening symptoms.  He presents now for revision of at least the bearing surface.  Possible femoral revision also.  PROCEDURE IN DETAIL:  After successful administration of general anesthetic, the patient was placed in the left lateral decubitus position, the right side up and held with a hip positioner.  The right lower extremity was isolated from his perineum with plastic drapes and prepped and draped in the usual sterile fashion.  Previous posterolateral incisions were utilized.  Skin cut with a #10 blade through subcutaneous tissue to the level of the fascia lata.  This was incised in line with the skin incision.  There was fluid encountered coming from the joint.  It was cloudy fluid consistent with what I have seen before in these metal reactions between the femoral head and  neck. There was no metal staining of the tissue.  Fortunately, there was no tissue destruction present.  I performed a capsulectomy and then we dislocated the hip.  The femoral head was removed.  There was corrosion at the junction between the femoral head and femoral neck.  There was enough damage on the femoral neck where I did not want to leave the stem intact.  The soft tissue overlying and overgrowing the femoral neck was removed to get clear access to the proximal femur.  The SROM chisel was then used to disrupt the interface between the stem and the sleeve.  I was then easily able to remove the femoral stem, with no evidence of any bone damage or bone loss.  Fortunately, the sleeve was well fixed, in good position.  No evidence of any proximal bony lysis.  The femur was then retracted anteriorly to gain acetabular exposure. Acetabular retractors were placed.  The rim around the acetabulum was cleared of any hypertrophic soft tissue.  Again, fortunately we did not see any tissue destruction.  I was then able to remove the liner from the acetabular shell.  At this time we got back the results of the Gram stain, which showed rare white  cells and no organisms.  I thoroughly irrigated out the acetabular shell and then after removing the metal liner placed a 36 mm neutral +4 polyethylene liner for the 60 mm acetabular shell.  This was a marathon liner.  The stem was removed and the femur was a 22 x 17 with a 36+ 12 neck.  Then placed a new 22 x 17 stem with 36+ 12 neck matching native anteversion.  The stem was impacted.  A trial 36+ 6 head was placed and the hip was reduced with outstanding stability.  There was full extension, full external rotation, 70 degrees flexion, 40 degrees adduction, 90 degrees internal rotation, 90 degrees of flexion, and 70 degrees of internal rotation. By placing the right leg on top of the left, it felt as though the leg lengths were equal.  The hip was  then dislocated and the permanent 36+ 6 ceramic head was placed.  The hip was reduced with the same stability parameters.  The wound was copiously irrigated with saline solution and the short rotators and capsule were reattached to the femur through drill holes with Ethibond suture.  The fascia lata was closed over Hemovac drain with interrupted #1 Vicryl, subcu closed with a running #1 Quill and interrupted 2-0 Vicryl and subcuticular running 4-0 Monocryl. The incision was cleaned and dried, and Steri-Strips and a bulky sterile dressing applied.  Please note that prior to closing the subcu a total of 20 mL of Exparel mixed with 50 mL of saline was injected into the fascia lata, gluteal muscles and subcutaneous tissues.  Once fully closed, the drain was hooked to suction, the incision cleaned and dried and a bulky sterile dressing applied.  He was then placed into a knee immobilizer, awakened, and transported to Recovery in stable condition.     Ollen Gross, M.D.     FA/MEDQ  D:  04/22/2011  T:  04/22/2011  Job:  [id number]

## 2011-04-22 NOTE — Progress Notes (Signed)
CSW consulted for SNF placement. H&P indicates pt would like to go home upon d/c. CSW will review PT recommendations and assist with SNF placement if required and pt agreeable with plan.

## 2011-04-22 NOTE — Anesthesia Postprocedure Evaluation (Signed)
Anesthesia Post Note  Patient: [name]  Procedure(s) Performed:  TOTAL HIP REVISION  Anesthesia type: General  Patient location: PACU  Post pain: Pain level controlled  Post assessment: Post-op Vital signs reviewed  Last Vitals:  Filed Vitals:   04/22/11 0945  BP: 118/64  Pulse: 56  Temp:   Resp: 13    Post vital signs: Reviewed  Level of consciousness: sedated  Complications: No apparent anesthesia complications

## 2011-04-22 NOTE — Interval H&P Note (Signed)
History and Physical Interval Note:  04/22/2011 7:13 AM  [name]  has presented today for surgery, with the diagnosis of right hip metallosis  The various methods of treatment have been discussed with the patient and family. After consideration of risks, benefits and other options for treatment, the patient has consented to  Procedure(s): TOTAL HIP REVISION as a surgical intervention .  The patients' history has been reviewed, patient examined, no change in status, stable for surgery.  I have reviewed the patients' chart and labs.  Questions were answered to the patient's satisfaction.     Loanne Drilling

## 2011-04-22 NOTE — H&P (View-Only) (Signed)
[name]  DOB: 06/21/1945 Married / Language: English / Race: White / Male  Date of Admission: 04/22/2011  Chief Complaint:  Right Hip pain  History of Present Illness The patient is a 65 year old male who comes in today for a preoperative History and Physical. The patient is scheduled for a right total hip arthroplasty revision to be performed by Dr. Frank V. Aluisio, MD at [facility] on 04/22/2011. [name] continues with the lateral sided pain and swelling. We did the aspiration and it helped for a short amount of time. We already discussed that the patient is going to need to get the bearing surface revised. I believe he has an issue between the trunnion of the femoral neck and the femoral head. He has a 40-mm head and these have been the ones that have caused the issues before. The patient had a fluid collection on the MRI scan. Symptoms reported were pain and swelling. The following medication has been used for pain control: antiinflammatory medication (motrin). The patient improved following aspiration (less restricted for 2-3 days). [name] states that the aspiration did help for a while. His pain has recurred. The patient stated he was on the back burner for a while, as his wife just had cardiac surgery. He wanted to hold off on anything for his hip for a while. The patient stateed that the hip pain was better than it was prior to the aspiration but that he still has discomfort laterally. All of his pain remains lateral. He is not getting any limp, popping, or catching going on. It is felt that he would benefit from undergoing exchanging the head to a smaller head. He is now ready to proceed with surgery. They have been treated conservatively in the past for the above stated problem and despite conservative measures, they continue to have progressive pain and severe functional limitations and dysfunction. It is felt that they would benefit from undergoing total hip  revision. Risks and benefits of the procedure have been discussed with the patient and they elect to proceed with surgery. There are no active contraindications to surgery such as ongoing infection or rapidly progressive neurological disease.  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 uses CPAP Non-Insulin Dependent Diabetes Mellitus Measles Mumps Rubella  Allergies No Known Drug Allergies  Family History Congestive Heart Failure. father Depression. mother Heart Disease. father Heart disease in male family member before age 55 Father. Deceased. age 61 Mother. Deceased. age 68  Social History Alcohol use. current drinker; drinks beer; less than 5 per week Children. 0 Current work status. working full time Drug/Alcohol Rehab (Currently). no Drug/Alcohol Rehab (Previously). no Exercise. Exercises daily; does running / walking Illicit drug use. no Living situation. live with spouse Marital status. married Number of flights of stairs before winded. 2-3 Pain Contract. no Tobacco / smoke exposure. no Tobacco use. Former smoker. former smoker; smoke(d) 1 pack(s) per day; uses less than half 1/2 can(s) smokeless per week Post-Surgical Plans. Plan is to go home.  Past Surgical History Total Hip Replacement. right Total Knee Replacement. bilateral   Review of Systems General:Not Present- Chills, Fever, Night Sweats, Appetite Loss, Fatigue, Feeling sick, Weight Gain and Weight Loss. Skin:Not Present- Itching, Rash, Skin Color Changes, Ulcer, Psoriasis and Change in Hair or Nails. HEENT:Present- Ringing in the Ears. Not Present- Sensitivity to light, Hearing problems and Nose Bleed. Neck:Not Present- Swollen Glands and Neck Mass. Respiratory:Not Present- Snoring, Chronic   Cough, Bloody sputum and Dyspnea. Cardiovascular:Not Present- Shortness of Breath, Chest Pain,  Swelling of Extremities, Leg Cramps and Palpitations. Gastrointestinal:Not Present- Bloody Stool, Heartburn, Abdominal Pain, Vomiting, Nausea and Incontinence of Stool. Male Genitourinary:Present- Weak urinary stream. Not Present- Blood in Urine, Frequency, Incontinence and Nocturia. Musculoskeletal:Present- Joint Stiffness and Joint Swelling. Not Present- Muscle Weakness, Muscle Pain, Joint Pain and Back Pain. Neurological:Not Present- Tingling, Numbness, Burning, Tremor, Headaches and Dizziness. Psychiatric:Present- Depression. Not Present- Anxiety and Memory Loss. Endocrine:Not Present- Cold Intolerance, Heat Intolerance, Excessive hunger and Excessive Thirst. Hematology:Not Present- Abnormal Bleeding, Anemia, Blood Clots and Easy Bruising.  Vitals Weight: 258 lb Height: 74 in Body Surface Area: 2.47 m Body Mass Index: 33.12 kg/m Pulse: 72 (Regular) Resp.: 14 (Unlabored) BP: 116/64 (Sitting, Right Arm, Standard)  Physical Exam The physical exam findings are as follows: Patient is a 65 year old male with continued hip pain and swelling.   General Mental Status - Alert, cooperative and good historian. General Appearance- pleasant. Not in acute distress. Orientation- Oriented X3. Build & Nutrition- Well nourished and Well developed.   Head and Neck Head- normocephalic, atraumatic . Neck Global Assessment- supple. no bruit auscultated on the right and no bruit auscultated on the left.   Eye Pupil- Bilateral- Regular and Round. Motion- Bilateral- EOMI.   Chest and Lung Exam Auscultation: Breath sounds:- clear at anterior chest wall and - clear at posterior chest wall. Adventitious sounds:- No Adventitious sounds.   Cardiovascular Auscultation:Rhythm- Regular rate and rhythm. Heart Sounds- S1 WNL and S2 WNL. Murmurs & Other Heart Sounds:Auscultation of the heart reveals - No Murmurs.   Abdomen Inspection:Contour- Generalized  mild distention. Palpation/Percussion:Tenderness- Abdomen is non-tender to palpation. Rigidity (guarding)- Abdomen is soft. Auscultation:Auscultation of the abdomen reveals - Bowel sounds normal.   Male Genitourinary Not done, not pertinent to present illness  Musculoskeletal On examination, well-developed male, alert and oriented, in no apparent distress. Examination of the right hip, flexed to about 100 degrees, rotated in 20, out 30, abducted 30 without discomfort.  IMAGES: I reviewed the patient's MRI scan again today showing both he and his wife. It showed the fluid collection but did not show any muscle damage at this point.  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 Aluisio.  PCP - Dr. Kohut  Drew Amanpreet Delmont, PA-C    

## 2011-04-22 NOTE — Progress Notes (Signed)
Pt arrived from PACU to room in bed, A&Ox4. Assessment as charted. Foley draining, hemovac charged w/ bloody drainage noted. Dsg c/d/i w/ ice packs to hip. Pt and wife oriented to callbell and environment. POC discussed.

## 2011-04-22 NOTE — Anesthesia Procedure Notes (Signed)
Procedure Name: Intubation Date/Time: 04/22/2011 7:20 AM Performed by: Joycie Peek Pre-anesthesia Checklist: Patient identified, Timeout performed, Emergency Drugs available, Suction available and Patient being monitored Patient Re-evaluated:Patient Re-evaluated prior to inductionOxygen Delivery Method: Circle System Utilized Preoxygenation: Pre-oxygenation with 100% oxygen Intubation Type: IV induction Ventilation: Mask ventilation without difficulty Laryngoscope Size: Mac and 4 Grade View: Grade I Tube type: Oral Tube size: 7.5 mm Number of attempts: 1 Airway Equipment and Method: stylet Placement Confirmation: ETT inserted through vocal cords under direct vision,  breath sounds checked- equal and bilateral and positive ETCO2 Secured at: 22 cm Tube secured with: Tape Dental Injury: Teeth and Oropharynx as per pre-operative assessment

## 2011-04-22 NOTE — Brief Op Note (Signed)
04/22/2011  8:46 AM  PATIENT:  [name]  66 y.o. male  PRE-OPERATIVE DIAGNOSIS:  right hip metallosis  POST-OPERATIVE DIAGNOSIS:  right hip metallosis  PROCEDURE:  Procedure(s): TOTAL HIP REVISION-Right  SURGEON:  Surgeon(s): Loanne Drilling, MD  PHYSICIAN ASSISTANT:   ASSISTANTS: Avel Peace, PA-C   ANESTHESIA:   general  EBL:  Total I/O In: 2000 [I.V.:2000] Out: 350 [Urine:150; Blood:200]  BLOOD ADMINISTERED:none  DRAINS: (medium) Hemovact drain(s) in the right thigh with  Suction Open   LOCAL MEDICATIONS USED:  OTHER Exparel  COUNTS:  YES  DICTATION: .Other Dictation: Dictation Number [id number]  PLAN OF CARE: Admit to inpatient   PATIENT DISPOSITION:  PACU - hemodynamically stable.   Gus Rankin Nichoel Digiulio, MD    04/22/2011, 8:47 AM

## 2011-04-22 NOTE — Transfer of Care (Signed)
Immediate Anesthesia Transfer of Care Note  Patient: [name]  Procedure(s) Performed:  TOTAL HIP REVISION  Patient Location: PACU  Anesthesia Type: General  Level of Consciousness: sedated  Airway & Oxygen Therapy: Patient Spontanous Breathing and Patient connected to face mask oxygen  Post-op Assessment: Report given to PACU RN and Post -op Vital signs reviewed and stable  Post vital signs: Reviewed and stable  Complications: No apparent anesthesia complications

## 2011-04-23 LAB — GLUCOSE, CAPILLARY: Glucose-Capillary: 151 mg/dL — ABNORMAL HIGH (ref 70–99)

## 2011-04-23 LAB — BASIC METABOLIC PANEL
BUN: 8 mg/dL (ref 6–23)
CO2: 25 mEq/L (ref 19–32)
Calcium: 8.5 mg/dL (ref 8.4–10.5)
Chloride: 103 mEq/L (ref 96–112)
Creatinine, Ser: 0.74 mg/dL (ref 0.50–1.35)
GFR calc Af Amer: >90 mL/min (ref >90)
GFR calc non Af Amer: >90 mL/min (ref >90)
Glucose, Bld: 170 mg/dL — ABNORMAL HIGH (ref 70–99)
Potassium: 4.3 mEq/L (ref 3.5–5.1)
Sodium: 135 mEq/L (ref 135–145)

## 2011-04-23 LAB — CBC
HCT: 32.7 % — ABNORMAL LOW (ref 39.0–52.0)
MCHC: 33 g/dL (ref 30.0–36.0)
MCV: 87 fL (ref 78.0–100.0)
Platelets: 240 10*3/uL (ref 150–400)
RDW: 12.7 % (ref 11.5–15.5)
WBC: 8.7 10*3/uL (ref 4.0–10.5)

## 2011-04-23 NOTE — Evaluation (Signed)
Physical Therapy Evaluation Patient Details Name: [name] MRN: [id number] DOB: 13-Oct-1945 Today's Date: 04/23/2011  Problem List:  Patient Active Problem List  Diagnoses  . OBSTRUCTIVE SLEEP APNEA  . Hip pain, right    Past Medical History:  Past Medical History  Diagnosis Date  . Sleep apnea     settings 10.6  / followed by Dr Lennie Hummer study years ago  . Diabetes mellitus     PCP Dr Juleen China  . Recurrent upper respiratory infection (URI)     states Dr Lequita Halt aware- no fever- states is improving  . Arthritis   . Depression     ADD   Past Surgical History:  Past Surgical History  Procedure Date  . Cardiac catheterization     2008 pre op gastric bypass  . Joint replacement     Left, Right Knee, Right hip  . Knee arthroscopy     right x 3-4, left x 2  . Knee arthrotomy     right  . Gastric bypass 2008  . Polypectomy     vocal cords 1992    PT Assessment/Plan/Recommendation PT Assessment Clinical Impression Statement: pt is s/p revision RTHA posterior, will benefit from pt  to improve functional mobility ROM, strength to DC home PT Recommendation/Assessment: Patient will need skilled PT in the acute care venue PT Problem List: Decreased strength;Decreased range of motion;Decreased activity tolerance;Decreased safety awareness;Decreased knowledge of precautions;Pain PT Therapy Diagnosis : Difficulty walking;Acute pain PT Plan PT Frequency: 7X/week PT Treatment/Interventions: DME instruction;Gait training;Stair training;Functional mobility training;Therapeutic activities;Therapeutic exercise;Patient/family education PT Recommendation Follow Up Recommendations: Home health PT;Supervision/Assistance - 24 hour Equipment Recommended: None recommended by PT PT Goals  Acute Rehab PT Goals PT Goal Formulation: With patient Time For Goal Achievement: 7 days Pt will go Supine/Side to Sit: with supervision PT Goal: Supine/Side to Sit - Progress: Goal set today Pt  will go Sit to Supine/Side: with supervision PT Goal: Sit to Supine/Side - Progress: Goal set today Pt will go Sit to Stand: with supervision PT Goal: Sit to Stand - Progress: Goal set today Pt will go Stand to Sit: with supervision PT Goal: Stand to Sit - Progress: Goal set today Pt will Ambulate: with supervision;with rolling walker;51 - 150 feet PT Goal: Ambulate - Progress: Goal set today Pt will Go Up / Down Stairs: 1-2 stairs;with supervision;with rolling walker PT Goal: Up/Down Stairs - Progress: Goal set today Pt will Perform Home Exercise Program: with supervision, verbal cues required/provided PT Goal: Perform Home Exercise Program - Progress: Goal set today  PT Evaluation Precautions/Restrictions  Precautions Precautions: Posterior Hip Restrictions Weight Bearing Restrictions: No Prior Functioning  Home Living Lives With: Spouse Type of Home: House Home Layout: One level Home Access: Stairs to enter Entrance Stairs-Rails: None Entrance Stairs-Number of Steps: 1 Home Adaptive Equipment: Walker - rolling;Bedside commode/3-in-1;Reacher;Long-handled sponge;Long-handled shoehorn Prior Function Level of Independence: Independent with basic ADLs Able to Take Stairs?: Yes Vocation: Full time employment Cognition Cognition Arousal/Alertness: Awake/alert Overall Cognitive Status: Appears within functional limits for tasks assessed Orientation Level: Oriented X4 Sensation/Coordination Sensation Light Touch: Appears Intact Extremity Assessment RUE Assessment RUE Assessment: Within Functional Limits LUE Assessment LUE Assessment: Within Functional Limits RLE Assessment RLE Assessment: Exceptions to [facility] RLE Strength RLE Overall Strength Comments: requires asist to move leg to edge LLE Assessment LLE Assessment: Within Functional Limits Mobility (including Balance) Bed Mobility Bed Mobility: Yes Supine to Sit: 4: Min assist;HOB elevated (Comment degrees) Supine to  Sit Details (indicate cue type and reason):  vc for precautions Transfers Transfers: Yes Sit to Stand: 4: Min assist;From elevated surface;With upper extremity assist;From bed Sit to Stand Details (indicate cue type and reason): vc for precautions Ambulation/Gait Ambulation/Gait: Yes Ambulation/Gait Assistance: 4: Min assist Ambulation/Gait Assistance Details (indicate cue type and reason): vc for sequence, posture Ambulation Distance (Feet): 80 Feet Assistive device: Rolling walker Gait Pattern: Step-through pattern    Exercise  Total Joint Exercises Quad Sets: AROM;10 reps;Both;Supine Short Arc Quad: AROM;Right;10 reps;Supine Heel Slides: AAROM;Right;Supine;15 reps Hip ABduction/ADduction: AAROM;Right;15 reps;Supine End of Session PT - End of Session Activity Tolerance: Patient tolerated treatment well Patient left: in chair;with call bell in reach Nurse Communication: Mobility status for transfers General Behavior During Session: [facility] for tasks performed  Rada Hay 04/23/2011, 3:51 PM

## 2011-04-23 NOTE — Progress Notes (Signed)
Physical Therapy Treatment Patient Details Name: MOROCCO GIPE MRN: [id number] DOB: [birth year] Today's Date: 04/23/2011  PT Assessment/Plan  PT - Assessment/Plan PT Plan: Discharge plan remains appropriate PT Frequency: 7X/week Follow Up Recommendations: Home health PT;Supervision/Assistance - 24 hour Equipment Recommended: None recommended by PT PT Goals  Acute Rehab PT Goals PT Goal Formulation: With patient Time For Goal Achievement: 7 days Pt will go Supine/Side to Sit: with supervision PT Goal: Supine/Side to Sit - Progress: Goal set today Pt will go Sit to Supine/Side: with supervision PT Goal: Sit to Supine/Side - Progress: Progressing toward goal Pt will go Sit to Stand: with supervision PT Goal: Sit to Stand - Progress: Progressing toward goal Pt will go Stand to Sit: with supervision PT Goal: Stand to Sit - Progress: Progressing toward goal Pt will Ambulate: with supervision;with rolling walker;51 - 150 feet PT Goal: Ambulate - Progress: Progressing toward goal Pt will Go Up / Down Stairs: 1-2 stairs;with supervision;with rolling walker PT Goal: Up/Down Stairs - Progress: Goal set today Pt will Perform Home Exercise Program: with supervision, verbal cues required/provided PT Goal: Perform Home Exercise Program - Progress: Goal set today  PT Treatment Precautions/Restrictions  Precautions Precautions: Posterior Hip Restrictions Weight Bearing Restrictions: No Mobility (including Balance) Bed Mobility Bed Mobility: Yes Supine to Sit: 4: Min assist;HOB elevated (Comment degrees) Supine to Sit Details (indicate cue type and reason): vc for precautions Sit to Supine: 4: Min assist;HOB flat Sit to Supine - Details (indicate cue type and reason): vc for precautions Transfers Transfers: Yes Sit to Stand: 4: Min assist;From chair/3-in-1;With upper extremity assist Sit to Stand Details (indicate cue type and reason): vc for precautions Stand to Sit: To bed;With upper  extremity assist;4: Min assist Ambulation/Gait Ambulation/Gait: Yes Ambulation/Gait Assistance: 4: Min assist Ambulation/Gait Assistance Details (indicate cue type and reason): vc for sequence, posture Ambulation Distance (Feet): 20 Feet Assistive device: Rolling walker Gait Pattern: Step-through pattern    Exercise  Total Joint Exercises Quad Sets: AROM;10 reps;Both;Supine Short Arc Quad: AROM;Right;10 reps;Supine Heel Slides: AAROM;Right;Supine;15 reps Hip ABduction/ADduction: AAROM;Right;15 reps;Supine End of Session PT - End of Session Activity Tolerance: Patient tolerated treatment well Patient left: in bed;with call bell in reach Nurse Communication: Mobility status for transfers General Behavior During Session: [facility] for tasks performed  Rada Hay 04/23/2011, 3:56 PM

## 2011-04-23 NOTE — Progress Notes (Signed)
Patient ID: [name], male   DOB: 28-Jul-1945, 66 y.o.   MRN: [id number] Alert and comfortable.  Hgb 10.8.  Hemovac out.

## 2011-04-23 NOTE — Progress Notes (Signed)
Pt placed themselves on CPAP

## 2011-04-23 NOTE — Progress Notes (Signed)
CARE MANAGEMENT NOTE 04/23/2011  Patient:  [name], [name]   Account Number:  [id number]  Date Initiated:  04/23/2011  Documentation initiated by:  Jazzma Neidhardt  Subjective/Objective Assessment:   66 yo male admitted s/p Right total hip arthroplasty revision. PTA pt lived with spouse.     Action/Plan:   Home when stable   Anticipated DC Date:  04/25/2011   Anticipated DC Plan:  HOME W HOME HEALTH SERVICES  In-house referral  NA      DC Planning Services  CM consult      [facility] Choice  HOME HEALTH   Choice offered to / List presented to:  C-1 Patient   DME arranged  NA      DME agency  NA     HH arranged  HH-2 PT      [facility] agency  [facility]   Status of service:  In process, will continue to follow Medicare Important Message given?   (If response is "NO", the following Medicare IM given date fields will be blank) Date Medicare IM given:   Date Additional Medicare IM given:    Discharge Disposition:    Per UR Regulation:    Comments:  04/23/11 1539 Rafal Archuleta,Rn,BSN [id number] CM spoke with pt concerning d/c planning. Pt visited earlier in week by CSW concerning possible SNF placement. pt with wife present at bedside wants to go home with [facility] services. Per pt choice [facility] to provide HHPT. [facility] notified of referral. Pt states having DME from previous hip surgery. No other HH services or DME requested, wife to assist in home care. Awaiting MD orders.

## 2011-04-24 LAB — GLUCOSE, CAPILLARY
Glucose-Capillary: 206 mg/dL — ABNORMAL HIGH (ref 70–99)
Glucose-Capillary: 168 mg/dL — ABNORMAL HIGH (ref 70–99)

## 2011-04-24 LAB — CBC
HCT: 33.1 % — ABNORMAL LOW (ref 39.0–52.0)
Hemoglobin: 11 g/dL — ABNORMAL LOW (ref 13.0–17.0)
MCH: 29.2 pg (ref 26.0–34.0)
MCHC: 33.2 g/dL (ref 30.0–36.0)
MCV: 87.8 fL (ref 78.0–100.0)
RBC: 3.77 MIL/uL — ABNORMAL LOW (ref 4.22–5.81)

## 2011-04-24 LAB — BASIC METABOLIC PANEL
BUN: 7 mg/dL (ref 6–23)
CO2: 29 mEq/L (ref 19–32)
Calcium: 8.5 mg/dL (ref 8.4–10.5)
Creatinine, Ser: 0.75 mg/dL (ref 0.50–1.35)
Glucose, Bld: 172 mg/dL — ABNORMAL HIGH (ref 70–99)

## 2011-04-24 NOTE — Progress Notes (Signed)
Physical Therapy Treatment Patient Details Name: [name] MRN: [id number] DOB: 11/25/1945 Today's Date: 04/24/2011  PT Assessment/Plan  PT - Assessment/Plan Comments on Treatment Session: pt progressing well, disappointed that he is not leaving today.   PT Plan: Discharge plan remains appropriate PT Frequency: 7X/week Recommendations for Other Services: OT consult Follow Up Recommendations: Home health PT;Supervision/Assistance - 24 hour Equipment Recommended: None recommended by PT PT Goals  Acute Rehab PT Goals PT Goal Formulation: With patient Time For Goal Achievement: 7 days Pt will go Supine/Side to Sit: with supervision Pt will go Sit to Supine/Side: with supervision PT Goal: Sit to Supine/Side - Progress: Partly met Pt will go Sit to Stand: with supervision PT Goal: Sit to Stand - Progress: Met Pt will go Stand to Sit: with supervision PT Goal: Stand to Sit - Progress: Met Pt will Ambulate: with supervision;with rolling walker;51 - 150 feet PT Goal: Ambulate - Progress: Met Pt will Perform Home Exercise Program: with supervision, verbal cues required/provided PT Goal: Perform Home Exercise Program - Progress: Progressing toward goal  PT Treatment Precautions/Restrictions  Precautions Precautions: Posterior Hip Required Braces or Orthoses: No Restrictions Weight Bearing Restrictions: No Mobility (including Balance) Bed Mobility Bed Mobility: Yes Supine to Sit: 4: Min assist;HOB flat Supine to Sit Details (indicate cue type and reason): Required assist with the right leg. Sit to Supine: 5: Supervision;HOB flat Sit to Supine - Details (indicate cue type and reason): pt eager to perform w/ no A Transfers Sit to Stand: 6: Modified independent (Device/Increase time);Without upper extremity assist;From bed Stand to Sit: 6: Modified independent (Device/Increase time);To bed;With upper extremity assist Ambulation/Gait Ambulation/Gait Assistance: 5:  Supervision Ambulation Distance (Feet): 20 Feet Assistive device: Rolling walker Gait Pattern: Step-through pattern    Exercise  Total Joint Exercises Quad Sets: AROM;10 reps;Both;Supine Short Arc Quad: AROM;Right;10 reps;Supine Heel Slides: AAROM;Right;Supine;15 reps Hip ABduction/ADduction: AAROM;Right;15 reps;Supine End of Session PT - End of Session Activity Tolerance: Patient tolerated treatment well Patient left: in bed;with call bell in reach Nurse Communication: Mobility status for transfers General Behavior During Session: [facility] for tasks performed Cognition: [facility] for tasks performed  Rada Hay 04/24/2011, 1:19 PM

## 2011-04-24 NOTE — Progress Notes (Signed)
Subjective: 2 Days Post-Op Procedure(s) (LRB): TOTAL HIP REVISION (Right) Patient reports pain as mild.    Objective: Vital signs in last 24 hours: Temp:  [98.2 F (36.8 C)-99 F (37.2 C)] 98.6 F (37 C) (02/10 0500) Pulse Rate:  [56-60] 60  (02/10 0500) Resp:  [18-20] 20  (02/10 0500) BP: (118-144)/(67-73) 124/71 mmHg (02/10 0500) SpO2:  [94 %-95 %] 94 % (02/10 0500)  Intake/Output from previous day: 02/09 0701 - 02/10 0700 In: 2567.3 [P.O.:960; I.V.:1607.3] Out: 3200 [Urine:3100; Drains:100] Intake/Output this shift:     Basename 04/24/11 0410 04/23/11 0336  HGB 11.0* 10.8*    Basename 04/24/11 0410 04/23/11 0336  WBC 7.3 8.7  RBC 3.77* 3.76*  HCT 33.1* 32.7*  PLT 207 240    Basename 04/24/11 0410 04/23/11 0336  NA 139 135  K 3.8 4.3  CL 104 103  CO2 29 25  BUN 7 8  CREATININE 0.75 0.74  GLUCOSE 172* 170*  CALCIUM 8.5 8.5   No results found for this basename: LABPT:2,INR:2 in the last 72 hours  Wound clean, dry, intact with no erythema. No change neurovascular status No calf tenderness  Assessment/Plan: 2 Days Post-Op Procedure(s) (LRB): TOTAL HIP REVISION (Right) Up with therapy On Xarelto for DVT prophylaxis Labs and V/S stable  Nelida Mandarino A 04/24/2011, 8:59 AM

## 2011-04-24 NOTE — Progress Notes (Signed)
Physical Therapy Treatment Patient Details Name: [name] MRN: [id number] DOB: Jul 24, 1945 Today's Date: 04/24/2011  PT Assessment/Plan  PT - Assessment/Plan Comments on Treatment Session: pt progressing well, disappointed that he is not leaving today.   PT Plan: Discharge plan remains appropriate PT Frequency: 7X/week Recommendations for Other Services: OT consult Follow Up Recommendations: Home health PT;Supervision/Assistance - 24 hour Equipment Recommended: None recommended by PT PT Goals  Acute Rehab PT Goals PT Goal Formulation: With patient Time For Goal Achievement: 7 days Pt will go Supine/Side to Sit: with supervision PT Goal: Supine/Side to Sit - Progress: Met Pt will go Sit to Supine/Side: with supervision PT Goal: Sit to Supine/Side - Progress: Met Pt will go Sit to Stand: with supervision PT Goal: Sit to Stand - Progress: Met Pt will go Stand to Sit: with supervision PT Goal: Stand to Sit - Progress: Met Pt will Ambulate: with supervision;with rolling walker;51 - 150 feet PT Goal: Ambulate - Progress: Goal set today Pt will Perform Home Exercise Program: with supervision, verbal cues required/provided PT Goal: Perform Home Exercise Program - Progress: Progressing toward goal  PT Treatment Precautions/Restrictions  Precautions Precautions: Posterior Hip Required Braces or Orthoses: No Restrictions Weight Bearing Restrictions: No Mobility (including Balance) Bed Mobility Supine to Sit: 5: Supervision;With rails Sit to Supine: 5: Supervision;HOB flat Sit to Supine - Details (indicate cue type and reason): pt eager to perform w/ no A Transfers Sit to Stand: 6: Modified independent (Device/Increase time);From bed;With upper extremity assist Stand to Sit: 6: Modified independent (Device/Increase time);To bed;With upper extremity assist Stand to Sit Details: vc  for hip precautions Ambulation/Gait Ambulation/Gait Assistance: 5: Supervision Ambulation/Gait  Assistance Details (indicate cue type and reason): vc to attempt to ER R leg as toes  appear to internally rotate Ambulation Distance (Feet): 200 Feet Assistive device: Rolling walker Gait Pattern: Step-through pattern    Exercise  T End of Session PT - End of Session Activity Tolerance: Patient tolerated treatment well Patient left: in bed;with call bell in reach Nurse Communication: Mobility status for transfers General Behavior During Session: [id number] for tasks performed Cognition: [facility] for tasks performed  Rada Hay 04/24/2011, 3:53 PM

## 2011-04-24 NOTE — Evaluation (Addendum)
Occupational Therapy Evaluation Patient Details Name: [name] MRN: [id number] DOB: [birth year] Today's Date: 04/24/2011 8:02-8:31  evI Problem List:  Patient Active Problem List  Diagnoses  . OBSTRUCTIVE SLEEP APNEA  . Hip pain, right    Past Medical History:  Past Medical History  Diagnosis Date  . Sleep apnea     settings 10.6  / followed by Dr Lennie Hummer study years ago  . Diabetes mellitus     PCP Dr Juleen China  . Recurrent upper respiratory infection (URI)     states Dr Lequita Halt aware- no fever- states is improving  . Arthritis   . Depression     ADD   Past Surgical History:  Past Surgical History  Procedure Date  . Cardiac catheterization     2008 pre op gastric bypass  . Joint replacement     Left, Right Knee, Right hip  . Knee arthroscopy     right x 3-4, left x 2  . Knee arthrotomy     right  . Gastric bypass 2008  . Polypectomy     vocal cords 1992    OT Assessment/Plan/Recommendation OT Assessment Clinical Impression Statement: 66 yr old male admitted for elective right THR.  Currently doing well and at a supervision/ modified independent level for most selfcare tasks and ADLs.  Pt will have initial supervision from spouse at D/C.  He is proficient with selfcare tasks using AE and has all DME needed from an OT standpoint.  Feel he does not warrant any further OT needs at this time. OT Recommendation/Assessment: Patient does not need any further OT services OT Recommendation Follow Up Recommendations: No OT follow up Equipment Recommended: None recommended by OT Individuals Consulted Consulted and Agree with Results and Recommendations: Patient  OT Evaluation Precautions/Restrictions  Precautions Precautions: Posterior Hip Required Braces or Orthoses: No Restrictions Weight Bearing Restrictions: No Prior Functioning Home Living Lives With: Spouse Type of Home: House Home Layout: One level Home Access: Stairs to enter Entrance Stairs-Rails:  None Entrance Stairs-Number of Steps: 1 Bathroom Shower/Tub: Health visitor: Standard Home Adaptive Equipment: Walker - rolling;Bedside commode/3-in-1;Reacher;Long-handled sponge;Long-handled shoehorn Prior Function Level of Independence: Independent with basic ADLs Driving: Yes Vocation: Full time employment ADL ADL Eating/Feeding: Simulated;Independent Where Assessed - Eating/Feeding: Edge of bed Grooming: Simulated;Modified independent Where Assessed - Grooming: Standing at sink Upper Body Bathing: Simulated;Set up Where Assessed - Upper Body Bathing: Sitting, bed;Unsupported Lower Body Bathing: Simulated;Supervision/safety Lower Body Bathing Details (indicate cue type and reason): Needs use of long handlle sponge and reacher for bathing and drying. Where Assessed - Lower Body Bathing: Sit to stand from bed Upper Body Dressing: Simulated;Set up Where Assessed - Upper Body Dressing: Sitting, bed;Unsupported Lower Body Dressing: Simulated;Set up Lower Body Dressing Details (indicate cue type and reason): Needs sock aide and reacher for dressing tasks. Where Assessed - Lower Body Dressing: Sit to stand from bed Toilet Transfer: Modified independent Toilet Transfer Method: Ambulating Toilet Transfer Equipment: Raised toilet seat with arms (or 3-in-1 over toilet) Toileting - Clothing Manipulation: Simulated;Modified independent Where Assessed - Toileting Clothing Manipulation: Sit to stand from 3-in-1 or toilet Toileting - Hygiene: Simulated;Modified independent Where Assessed - Toileting Hygiene: Sit to stand from 3-in-1 or toilet Tub/Shower Transfer: Supervision/safety Tub/Shower Transfer Method: Science writer: Walk in shower Equipment Used: Reacher;Rolling walker;Sock aid Ambulation Related to ADLs: Pt overall modified independent for ambulating to and from the bathroom using the RW.  Required assist at this time secondary to still being  hooked  up to the IV. ADL Comments: Pt overall modified independent to supervision level for most selfcare tasks and ADLs.  Able to state 1/3 THR precautions.  Has all AE and DME needed. Vision/Perception  Vision - History Baseline Vision: No visual deficits Patient Visual Report: No change from baseline Perception Perception: Within Functional Limits Praxis Praxis: Intact Cognition Cognition Arousal/Alertness: Awake/alert Overall Cognitive Status: Appears within functional limits for tasks assessed Orientation Level: Oriented X4 Sensation/Coordination Sensation Light Touch: Appears Intact Stereognosis: Not tested Hot/Cold: Not tested Proprioception: Not tested Coordination Gross Motor Movements are Fluid and Coordinated: Yes Fine Motor Movements are Fluid and Coordinated: Yes Extremity Assessment RUE Assessment RUE Assessment: Within Functional Limits LUE Assessment LUE Assessment: Within Functional Limits Mobility  Bed Mobility Bed Mobility: Yes Supine to Sit: 4: Min assist;HOB flat Supine to Sit Details (indicate cue type and reason): Required assist with the right leg. Transfers Transfers: Yes Sit to Stand: 6: Modified independent (Device/Increase time);Without upper extremity assist;From bed  End of Session OT - End of Session Equipment Utilized During Treatment: Other (comment) (Rolling Walker) Activity Tolerance: Patient tolerated treatment well Patient left: in chair;with call bell in reach General Behavior During Session: [facility] for tasks performed Cognition: [facility] for tasks performed   Imanie Darrow OTR/L 04/24/2011, 11:59 AM  Pager number [phone]

## 2011-04-25 LAB — BODY FLUID CULTURE

## 2011-04-25 LAB — CBC
Platelets: 200 10*3/uL (ref 150–400)
RBC: 3.75 MIL/uL — ABNORMAL LOW (ref 4.22–5.81)
WBC: 7 10*3/uL (ref 4.0–10.5)

## 2011-04-25 LAB — GLUCOSE, CAPILLARY: Glucose-Capillary: 169 mg/dL — ABNORMAL HIGH (ref 70–99)

## 2011-04-25 MED ORDER — RIVAROXABAN 10 MG PO TABS: 10.0000 mg | ORAL_TABLET | Freq: Every day | ORAL | Status: DC

## 2011-04-25 MED ORDER — OXYCODONE HCL 5 MG PO TABS: 5.0000 mg | ORAL_TABLET | ORAL | Status: AC | PRN

## 2011-04-25 MED ORDER — METHOCARBAMOL 500 MG PO TABS: 500.0000 mg | ORAL_TABLET | Freq: Four times a day (QID) | ORAL | Status: AC | PRN

## 2011-04-25 NOTE — Progress Notes (Signed)
Subjective: 3 Days Post-Op Procedure(s) (LRB): TOTAL HIP REVISION (Right) Patient reports pain as mild.   Patient seen in rounds by Dr. Lequita Halt. Patient doing Ok and ready to go.  Objective: Vital signs in last 24 hours: Temp:  [97.5 F (36.4 C)-98.6 F (37 C)] 98.5 F (36.9 C) (02/11 0550) Pulse Rate:  [54-62] 54  (02/11 0550) Resp:  [16-18] 18  (02/11 0550) BP: (105-137)/(68-79) 105/68 mmHg (02/11 0550) SpO2:  [95 %-98 %] 96 % (02/11 0550)  Intake/Output from previous day:  Intake/Output Summary (Last 24 hours) at 04/25/11 1003 Last data filed at 04/25/11 0900  Gross per 24 hour  Intake    720 ml  Output   3450 ml  Net  -2730 ml    Intake/Output this shift: Total I/O In: 240 [P.O.:240] Out: -   Labs: Results for orders placed during the hospital encounter of 04/22/11  TYPE AND SCREEN      Component Value Range   ABO/RH(D) A POS     Antibody Screen NEG     Sample Expiration 04/25/2011    GLUCOSE, CAPILLARY      Component Value Range   Glucose-Capillary 188 (*) 70 - 99 (mg/dL)  GRAM STAIN      Component Value Range   Specimen Description SYNOVIAL RT HIP     Special Requests PATIENT ON FOLLOWING ANCEF     Gram Stain       Value: FEW WBC PRESENT, PREDOMINANTLY MONONUCLEAR     NO ORGANISMS SEEN     Gram Stain Report Called to,Read Back By and Verified With: M.KRUPP RN AT 0825 ON 04/22/11 BY C.BONGEL   Report Status 04/22/2011 FINAL    BODY FLUID CULTURE      Component Value Range   Specimen Description SYNOVIAL RT HIP     Special Requests NONE     Gram Stain       Value: FEW WBC PRESENT, PREDOMINANTLY MONONUCLEAR     NO ORGANISMS SEEN     Gram Stain Report Called to,Read Back By and Verified With: Gram Stain Report Called to,Read Back By and Verified With: M KRUPP RN [phone] 04/22/11 BY C BONGEL Performed by [facility]   Culture NO GROWTH 2 DAYS     Report Status PENDING    ANAEROBIC CULTURE      Component Value Range   Specimen Description SYNOVIAL RT  HIP     Special Requests PATIENT ON FOLLOWING ANCEF     Gram Stain       Value: NO WBC SEEN     RARE SQUAMOUS EPITHELIAL CELLS PRESENT     RARE GRAM POSITIVE COCCI     IN PAIRS   Culture       Value: NO ANAEROBES ISOLATED; CULTURE IN PROGRESS FOR 5 DAYS   Report Status PENDING    GLUCOSE, CAPILLARY      Component Value Range   Glucose-Capillary 287 (*) 70 - 99 (mg/dL)  GLUCOSE, CAPILLARY      Component Value Range   Glucose-Capillary 289 (*) 70 - 99 (mg/dL)  CBC      Component Value Range   WBC 8.7  4.0 - 10.5 (K/uL)   RBC 3.76 (*) 4.22 - 5.81 (MIL/uL)   Hemoglobin 10.8 (*) 13.0 - 17.0 (g/dL)   HCT 19.1 (*) 47.8 - 52.0 (%)   MCV 87.0  78.0 - 100.0 (fL)   MCH 28.7  26.0 - 34.0 (pg)   MCHC 33.0  30.0 - 36.0 (g/dL)  RDW 12.7  11.5 - 15.5 (%)   Platelets 240  150 - 400 (K/uL)  BASIC METABOLIC PANEL      Component Value Range   Sodium 135  135 - 145 (mEq/L)   Potassium 4.3  3.5 - 5.1 (mEq/L)   Chloride 103  96 - 112 (mEq/L)   CO2 25  19 - 32 (mEq/L)   Glucose, Bld 170 (*) 70 - 99 (mg/dL)   BUN 8  6 - 23 (mg/dL)   Creatinine, Ser 1.61  0.50 - 1.35 (mg/dL)   Calcium 8.5  8.4 - 09.6 (mg/dL)   GFR calc non Af Amer >90  >90 (mL/min)   GFR calc Af Amer >90  >90 (mL/min)  GLUCOSE, CAPILLARY      Component Value Range   Glucose-Capillary 218 (*) 70 - 99 (mg/dL)  GLUCOSE, CAPILLARY      Component Value Range   Glucose-Capillary 151 (*) 70 - 99 (mg/dL)   Comment 1 Notify RN    GLUCOSE, CAPILLARY      Component Value Range   Glucose-Capillary 188 (*) 70 - 99 (mg/dL)  GLUCOSE, CAPILLARY      Component Value Range   Glucose-Capillary 164 (*) 70 - 99 (mg/dL)  CBC      Component Value Range   WBC 7.3  4.0 - 10.5 (K/uL)   RBC 3.77 (*) 4.22 - 5.81 (MIL/uL)   Hemoglobin 11.0 (*) 13.0 - 17.0 (g/dL)   HCT 04.5 (*) 40.9 - 52.0 (%)   MCV 87.8  78.0 - 100.0 (fL)   MCH 29.2  26.0 - 34.0 (pg)   MCHC 33.2  30.0 - 36.0 (g/dL)   RDW 81.1  91.4 - 78.2 (%)   Platelets 207  150 - 400  (K/uL)  BASIC METABOLIC PANEL      Component Value Range   Sodium 139  135 - 145 (mEq/L)   Potassium 3.8  3.5 - 5.1 (mEq/L)   Chloride 104  96 - 112 (mEq/L)   CO2 29  19 - 32 (mEq/L)   Glucose, Bld 172 (*) 70 - 99 (mg/dL)   BUN 7  6 - 23 (mg/dL)   Creatinine, Ser 9.56  0.50 - 1.35 (mg/dL)   Calcium 8.5  8.4 - 21.3 (mg/dL)   GFR calc non Af Amer >90  >90 (mL/min)   GFR calc Af Amer >90  >90 (mL/min)  GLUCOSE, CAPILLARY      Component Value Range   Glucose-Capillary 182 (*) 70 - 99 (mg/dL)   Comment 1 Documented in Chart     Comment 2 Notify RN    GLUCOSE, CAPILLARY      Component Value Range   Glucose-Capillary 166 (*) 70 - 99 (mg/dL)   Comment 1 Notify RN    GLUCOSE, CAPILLARY      Component Value Range   Glucose-Capillary 206 (*) 70 - 99 (mg/dL)   Comment 1 Notify RN    GLUCOSE, CAPILLARY      Component Value Range   Glucose-Capillary 140 (*) 70 - 99 (mg/dL)  CBC      Component Value Range   WBC 7.0  4.0 - 10.5 (K/uL)   RBC 3.75 (*) 4.22 - 5.81 (MIL/uL)   Hemoglobin 11.0 (*) 13.0 - 17.0 (g/dL)   HCT 08.6 (*) 57.8 - 52.0 (%)   MCV 87.2  78.0 - 100.0 (fL)   MCH 29.3  26.0 - 34.0 (pg)   MCHC 33.6  30.0 - 36.0 (g/dL)   RDW 12.7  11.5 - 15.5 (%)   Platelets 200  150 - 400 (K/uL)  GLUCOSE, CAPILLARY      Component Value Range   Glucose-Capillary 168 (*) 70 - 99 (mg/dL)   Comment 1 Documented in Chart     Comment 2 Notify RN    GLUCOSE, CAPILLARY      Component Value Range   Glucose-Capillary 169 (*) 70 - 99 (mg/dL)    Exam: Neurovascular intact Sensation intact distally Incision - clean, dry, no drainage Motor function intact - moving foot and toes well on exam.   Assessment/Plan: 3 Days Post-Op Procedure(s) (LRB): TOTAL HIP REVISION (Right) Procedure(s) (LRB): TOTAL HIP REVISION (Right) Past Medical History  Diagnosis Date  . Sleep apnea     settings 10.6  / followed by Dr Lennie Hummer study years ago  . Diabetes mellitus     PCP Dr Juleen China  . Recurrent  upper respiratory infection (URI)     states Dr Lequita Halt aware- no fever- states is improving  . Arthritis   . Depression     ADD   Principal Problem:  *Hip pain, right   Discharge home with home health Diet - carb modified - medium Follow up - in 2 weeks Activity - WBAT Condition Upon Discharge - Stable D/C Meds - See DC Summary DVT Prophylaxis - Xarelto  Protocol   PERKINS, ALEXZANDREW 04/25/2011, 10:03 AM

## 2011-04-25 NOTE — Progress Notes (Signed)
04/25/2011 Raynelle Bring BSN CCM [phone] Pt discharged  to home with [facility] services in place; will start tomorrow

## 2011-04-25 NOTE — Progress Notes (Signed)
Physical Therapy Treatment Patient Details Name: [name] MRN: [id number] DOB: Oct 01, 1945 Today's Date: 04/25/2011  PT Assessment/Plan  PT - Assessment/Plan Comments on Treatment Session: pt has met goals, ready for DC PT Goals  Acute Rehab PT Goals Pt will go Sit to Stand: with supervision PT Goal: Sit to Stand - Progress: Met Pt will go Stand to Sit: with supervision PT Goal: Stand to Sit - Progress: Met Pt will Go Up / Down Stairs: 1-2 stairs;with rolling walker;with supervision PT Goal: Up/Down Stairs - Progress: Met Pt will Perform Home Exercise Program: with supervision, verbal cues required/provided PT Goal: Perform Home Exercise Program - Progress: Met  PT Treatment Precautions/Restrictions  Precautions Precautions: Posterior Hip Required Braces or Orthoses: No Restrictions Weight Bearing Restrictions: No Mobility (including Balance) Bed Mobility Supine to Sit: 5: Supervision Supine to Sit Details (indicate cue type and reason): no assist required Transfers Sit to Stand: 6: Modified independent (Device/Increase time);From bed;With upper extremity assist Stand to Sit: 6: Modified independent (Device/Increase time);To bed;With upper extremity assist Ambulation/Gait Ambulation/Gait Assistance: 5: Supervision Ambulation Distance (Feet): 150 Feet Assistive device: Rolling walker Gait Pattern: Step-to pattern Stairs: Yes Stair Management Technique: No rails;With walker;Forwards Number of Stairs: 1  Height of Stairs: 6     Exercise  Total Joint Exercises Quad Sets: AROM;10 reps;Both;Supine Short Arc Quad: AROM;Right;10 reps;Supine Heel Slides: AAROM;Right;Supine;15 reps Hip ABduction/ADduction: AAROM;Right;15 reps;Supine End of Session PT - End of Session Activity Tolerance: Patient tolerated treatment well Patient left: in chair  Rada Hay 04/25/2011, 12:48 PM

## 2011-04-26 MED FILL — Sodium Chloride Inj 0.9%: INTRAMUSCULAR | Qty: 50 | Status: AC

## 2011-04-27 LAB — ANAEROBIC CULTURE: Gram Stain: NONE SEEN

## 2011-04-28 NOTE — Discharge Summary (Signed)
Physician Discharge Summary   Patient ID: [name] MRN: [id number] DOB/AGE: 1945-04-28 66 y.o.  Admit date: 04/22/2011 Discharge date: 04/25/2011  Primary Diagnosis: S/P Right total hip arthroplasty, Prosthetic joint implant failure  Admission Diagnoses: Past Medical History  Diagnosis Date  . Sleep apnea     settings 10.6  / followed by Dr Lennie Hummer study years ago  . Diabetes mellitus     PCP Dr Juleen China  . Recurrent upper respiratory infection (URI)     states Dr Lequita Halt aware- no fever- states is improving  . Arthritis   . Depression     ADD   Discharge Diagnoses:  Principal Problem:  *Hip pain, right  Procedure: Procedure(s) (LRB): TOTAL HIP REVISION (Right)   Consults: None  HPI: [name] is a 66 year old male who had a right total hip arthroplasty done approximately 2 years ago. He did well  initially, then started to develop significant lateral hip pain. He had a metal on metal hip replacement with a 40 mm head. He had a MRI, which showed a fluid collection around the hip. 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 He has had progressively worsening symptoms. He presents now for revision of at least the bearing surface. Possible femoral revision also.  Laboratory Data: Hospital Outpatient Visit on 04/18/2011  Component Date Value Range Status  . aPTT (seconds) 04/18/2011 29  24-37 Final  . WBC (K/uL) 04/18/2011 7.1  4.0-10.5 Final  . RBC (MIL/uL) 04/18/2011 4.71  4.22-5.81 Final  . Hemoglobin (g/dL) 40/98/1191 47.8  29.5-62.1 Final  . HCT (%) 04/18/2011 40.6  39.0-52.0 Final  . MCV (fL) 04/18/2011 86.2  78.0-100.0 Final  . MCH (pg) 04/18/2011 29.1  26.0-34.0 Final  . MCHC (g/dL) 30/86/5784 69.6  29.5-28.4 Final  . RDW (%) 04/18/2011 12.6  11.5-15.5 Final  . Platelets (K/uL) 04/18/2011 273  150-400 Final  . Sodium (mEq/L) 04/18/2011 139  135-145 Final  . Potassium (mEq/L) 04/18/2011 4.7  3.5-5.1 Final  . Chloride (mEq/L) 04/18/2011 104  96-112 Final  .  CO2 (mEq/L) 04/18/2011 26  19-32 Final  . Glucose, Bld (mg/dL) 13/24/4010 272* 53-66 Final  . BUN (mg/dL) 44/05/4740 9  5-95 Final  . Creatinine, Ser (mg/dL) 63/87/5643 3.29  5.18-8.41 Final  . Calcium (mg/dL) 66/08/3014 9.7  0.1-09.3 Final  . Total Protein (g/dL) 23/55/7322 7.3  0.2-5.4 Final  . Albumin (g/dL) 27/08/2374 3.8  2.8-3.1 Final  . AST (U/L) 04/18/2011 19  0-37 Final  . ALT (U/L) 04/18/2011 19  0-53 Final  . Alkaline Phosphatase (U/L) 04/18/2011 99  39-117 Final  . Total Bilirubin (mg/dL) 51/76/1607 0.4  3.7-1.0 Final  . GFR calc non Af Amer (mL/min) 04/18/2011 >90  >90 Final  . GFR calc Af Amer (mL/min) 04/18/2011 >90  >90 Final   Comment:                                 The eGFR has been calculated                          using the CKD EPI equation.                          This calculation has not been                          validated in  all clinical                          situations.                          eGFR's persistently                          <90 mL/min signify                          possible Chronic Kidney Disease.  Marland Kitchen Prothrombin Time (seconds) 04/18/2011 13.7  11.6-15.2 Final  . INR  04/18/2011 1.03  0.00-1.49 Final  . Color, Urine  04/18/2011 YELLOW  YELLOW Final  . APPearance  04/18/2011 CLEAR  CLEAR Final  . Specific Gravity, Urine  04/18/2011 1.018  1.005-1.030 Final  . pH  04/18/2011 5.5  5.0-8.0 Final  . Glucose, UA (mg/dL) 29/52/8413 >2440* NEGATIVE Final  . Hgb urine dipstick  04/18/2011 NEGATIVE  NEGATIVE Final  . Bilirubin Urine  04/18/2011 NEGATIVE  NEGATIVE Final  . Ketones, ur (mg/dL) 01/08/2535 NEGATIVE  NEGATIVE Final  . Protein, ur (mg/dL) 64/40/3474 NEGATIVE  NEGATIVE Final  . Urobilinogen, UA (mg/dL) 25/95/6387 1.0  5.6-4.3 Final  . Nitrite  04/18/2011 NEGATIVE  NEGATIVE Final  . Leukocytes, UA  04/18/2011 NEGATIVE  NEGATIVE Final  . MRSA, PCR  04/18/2011 NEGATIVE  NEGATIVE Final  . Staphylococcus aureus  04/18/2011 NEGATIVE   NEGATIVE Final   Comment:                                 The Xpert SA Assay (FDA                          approved for NASAL specimens                          only), is one component of                          a comprehensive surveillance                          program.  It is not intended                          to diagnose infection nor to                          guide or monitor treatment.  . Squamous Epithelial / LPF  04/18/2011 RARE  RARE Final  . WBC, UA (WBC/hpf) 04/18/2011 0-2  <3 Final   No results found for this basename: HGB:5 in the last 72 hours No results found for this basename: WBC:2,RBC:2,HCT:2,PLT:2 in the last 72 hours No results found for this basename: NA:2,K:2,CL:2,CO2:2,BUN:2,CREATININE:2,GLUCOSE:2,CALCIUM:2 in the last 72 hours No results found for this basename: LABPT:2,INR:2 in the last 72 hours  X-Rays: Chest 2 View  04/18/2011  *RADIOLOGY REPORT*  Clinical Data: Preop hip surgery  CHEST - 2 VIEW  Comparison: CT chest dated 01/14/2008  Findings: Lungs are essentially clear. No pleural effusion or pneumothorax.  Cardiomediastinal silhouette  is within normal limits.  Degenerative changes of the visualized thoracolumbar spine.  IMPRESSION: No evidence of acute cardiopulmonary disease.  Original Report Authenticated By: Charline Bills, M.D.   Dg Pelvis Portable  04/22/2011  *RADIOLOGY REPORT*  Clinical Data: Postop right hip replacement  PORTABLE PELVIS  Comparison: Pelvis film of 04/15/2009  Findings: The femoral and acetabular components of the right hip replacement are in good position.  Some air is noted in the soft tissues overlying the right hip postoperatively.  No acute fracture is seen.  Moderate degenerative change is noted in the left hip. The pelvic rami are intact.  IMPRESSION: Right total hip replacement in good position.  No acute abnormality.  Original Report Authenticated By: Juline Patch, M.D.   Dg Hip Portable 1 View Right  04/22/2011   *RADIOLOGY REPORT*  Clinical Data: Postop right hip replacement  PORTABLE RIGHT HIP - 1 VIEW  Comparison: Plain films 04/15/2009  Findings: Right total hip arthroplasty is noted.  The femoral component and acetabular components appear well seated.  No fracture.  Surgical drain in place.  IMPRESSION: No complicating features following right hip total arthroplasty.  Original Report Authenticated By: Genevive Bi, M.D.    EKG: Orders placed during the hospital encounter of 04/22/11  . EKG     Hospital Course: Patient was admitted to [facility] and taken to the OR and underwent the above state procedure without complications.  Patient tolerated the procedure well and was later transferred to the recovery room and then to the orthopaedic floor for postoperative care.  They were given PO and IV analgesics for pain control following their surgery.  They were given 24 hours of postoperative antibiotics and started on DVT prophylaxis.   PT and OT were ordered for total joint protocol.  Discharge planning consulted to help with postop disposition and equipment needs.  Patient had a decent night on the evening of surgery, comfortable the next morning and started to get up with therapy on day one. Hemovac drain was pulled without difficulty.  Continued to progress with therapy into day two.  Dressing was changed on day two and the incision was healing well.  By day three, the patient had progressed with therapy, walking over 150 feet, and meeting goals.  Incision was healing well.  Patient was seen in rounds and was ready to go home.  Discharge Medications: Prior to Admission medications   Medication Sig Start Date End Date Taking? Authorizing Provider  acetaminophen (TYLENOL) 500 MG tablet Take 500 mg by mouth every 6 (six) hours as needed. For pain.   Yes Historical Provider, MD  Dutasteride-Tamsulosin HCl 0.5-0.4 MG CAPS Take 1 capsule by mouth daily before breakfast.   Yes Historical Provider, MD    lithium carbonate (ESKALITH) 450 MG CR tablet Take 1,350 mg by mouth at bedtime.   Yes Historical Provider, MD  metFORMIN (GLUCOPHAGE) 500 MG tablet Take 1,000 mg by mouth daily before breakfast.   Yes Historical Provider, MD  methylphenidate (CONCERTA) 54 MG CR tablet Take 54 mg by mouth daily before breakfast.   Yes Historical Provider, MD  omeprazole (PRILOSEC) 20 MG capsule Take 20 mg by mouth daily before breakfast.   Yes Historical Provider, MD  propranolol (INDERAL) 20 MG tablet Take 40 mg by mouth 2 (two) times daily.   Yes Historical Provider, MD  zolpidem (AMBIEN) 10 MG tablet Take 10 mg by mouth at bedtime.   Yes Historical Provider, MD  methocarbamol (ROBAXIN) 500 MG tablet Take  1 tablet (500 mg total) by mouth every 6 (six) hours as needed. 04/25/11 05/05/11  Brailyn Killion, PA  oxyCODONE (OXY IR/ROXICODONE) 5 MG immediate release tablet Take 1-2 tablets (5-10 mg total) by mouth every 3 (three) hours as needed. 04/25/11 05/05/11  Danila Eddie Julien Girt, PA  rivaroxaban (XARELTO) 10 MG TABS tablet Take 1 tablet (10 mg total) by mouth daily with breakfast. 04/25/11   Cleburn Maiolo Julien Girt, PA    Diet: carb modified - medium  Activity:WBAT No bending hip over 90 degrees- A "L" Angle Do not cross legs Do not let foot roll inward  When turning these patients a pillow should be placed between the patient's legs to prevent crossing.  Patients should have the affected knee fully extended when trying to sit or stand from all surfaces to prevent excessive hip flexion.  When ambulating and turning toward the affected side the affected leg should have the toes turned out prior to moving the walker and the rest of patient's body as to prevent internal rotation/ turning in of the leg.  Abduction pillows are the most effective way to prevent a patient from not crossing legs or turning toes in at rest. If an abduction pillow is not ordered placing a regular pillow length wise between the patient's  legs is also an effective reminder.  It is imperative that these precautions be maintained so that the surgical hip does not dislocate.    Follow-up:in 2 weeks  Disposition: Home  Discharged Condition: good   Discharge Orders    Future Orders Please Complete By Expires   Diet - low sodium heart healthy      Call MD / Call 911      Comments:   If you experience chest pain or shortness of breath, CALL 911 and be transported to the hospital emergency room.  If you develope a fever above 101 F, pus (white drainage) or increased drainage or redness at the wound, or calf pain, call your surgeon's office.   Constipation Prevention      Comments:   Drink plenty of fluids.  Prune juice may be helpful.  You may use a stool softener, such as Colace (over the counter) 100 mg twice a day.  Use MiraLax (over the counter) for constipation as needed.   Increase activity slowly as tolerated      Weight Bearing as taught in Physical Therapy      Comments:   Use a walker or crutches as instructed.   Discharge instructions      Comments:   Pick up stool softner and laxative for home. Do not submerge incision under water. May shower. Continue to use ice for pain and swelling from surgery. Hip precautions.  Total Hip Protocol.   Driving restrictions      Comments:   No driving   Lifting restrictions      Comments:   No lifting   Follow the hip precautions as taught in Physical Therapy      Change dressing      Comments:   You may change your dressing daily with sterile 4 x 4 inch gauze dressing and paper tape.   TED hose      Comments:   Use stockings (TED hose) for 3 weeks on both leg(s).  You may remove them at night for sleeping.     Medication List  As of 04/28/2011 10:55 AM   STOP taking these medications         HYDROcodone-acetaminophen 5-325 MG per  tablet      mulitivitamin with minerals Tabs      VITAMIN B-12 PO      VITAMIN C PO         TAKE these medications          acetaminophen 500 MG tablet   Commonly known as: TYLENOL   Take 500 mg by mouth every 6 (six) hours as needed. For pain.      Dutasteride-Tamsulosin HCl 0.5-0.4 MG Caps   Take 1 capsule by mouth daily before breakfast.      lithium carbonate 450 MG CR tablet   Commonly known as: ESKALITH   Take 1,350 mg by mouth at bedtime.      metFORMIN 500 MG tablet   Commonly known as: GLUCOPHAGE   Take 1,000 mg by mouth daily before breakfast.      methocarbamol 500 MG tablet   Commonly known as: ROBAXIN   Take 1 tablet (500 mg total) by mouth every 6 (six) hours as needed.      methylphenidate 54 MG CR tablet   Commonly known as: CONCERTA   Take 54 mg by mouth daily before breakfast.      omeprazole 20 MG capsule   Commonly known as: PRILOSEC   Take 20 mg by mouth daily before breakfast.      oxyCODONE 5 MG immediate release tablet   Commonly known as: Oxy IR/ROXICODONE   Take 1-2 tablets (5-10 mg total) by mouth every 3 (three) hours as needed.      propranolol 20 MG tablet   Commonly known as: INDERAL   Take 40 mg by mouth 2 (two) times daily.      rivaroxaban 10 MG Tabs tablet   Commonly known as: XARELTO   Take 1 tablet (10 mg total) by mouth daily with breakfast.      zolpidem 10 MG tablet   Commonly known as: AMBIEN   Take 10 mg by mouth at bedtime.           Follow-up Information    Follow up with Loanne Drilling, MD. Schedule an appointment as soon as possible for a visit in 2 days.   Contact information:   [facility] [address] [place] [address] [phone]          Signed: Patrica Duel 04/28/2011, 10:55 AM

## 2011-05-16 ENCOUNTER — Encounter (HOSPITAL_COMMUNITY): Payer: Self-pay | Admitting: Orthopedic Surgery

## 2012-07-20 DIAGNOSIS — M5126 Other intervertebral disc displacement, lumbar region: Secondary | ICD-10-CM | POA: Diagnosis not present

## 2012-07-26 ENCOUNTER — Telehealth: Payer: Self-pay | Admitting: *Deleted

## 2012-07-26 NOTE — Telephone Encounter (Signed)
mistake

## 2012-08-17 DIAGNOSIS — F3175 Bipolar disorder, in partial remission, most recent episode depressed: Secondary | ICD-10-CM | POA: Diagnosis not present

## 2012-09-17 DIAGNOSIS — R0609 Other forms of dyspnea: Secondary | ICD-10-CM | POA: Diagnosis not present

## 2012-09-17 DIAGNOSIS — R0989 Other specified symptoms and signs involving the circulatory and respiratory systems: Secondary | ICD-10-CM | POA: Diagnosis not present

## 2012-09-17 DIAGNOSIS — R0602 Shortness of breath: Secondary | ICD-10-CM | POA: Diagnosis not present

## 2012-09-24 DIAGNOSIS — R0609 Other forms of dyspnea: Secondary | ICD-10-CM | POA: Diagnosis not present

## 2012-09-24 DIAGNOSIS — R079 Chest pain, unspecified: Secondary | ICD-10-CM | POA: Diagnosis not present

## 2012-09-24 DIAGNOSIS — R0989 Other specified symptoms and signs involving the circulatory and respiratory systems: Secondary | ICD-10-CM | POA: Diagnosis not present

## 2012-09-24 DIAGNOSIS — M25519 Pain in unspecified shoulder: Secondary | ICD-10-CM | POA: Diagnosis not present

## 2012-09-27 ENCOUNTER — Encounter: Payer: Self-pay | Admitting: Internal Medicine

## 2012-09-27 ENCOUNTER — Ambulatory Visit (INDEPENDENT_AMBULATORY_CARE_PROVIDER_SITE_OTHER): Payer: Medicare Other | Admitting: Internal Medicine

## 2012-09-27 VITALS — BP 92/60 | HR 50 | Temp 98.0°F | Ht 73.0 in | Wt 249.0 lb

## 2012-09-27 DIAGNOSIS — J45909 Unspecified asthma, uncomplicated: Secondary | ICD-10-CM

## 2012-09-27 MED ORDER — BUDESONIDE-FORMOTEROL FUMARATE 80-4.5 MCG/ACT IN AERO: 2.0000 | INHALATION_SPRAY | Freq: Two times a day (BID) | RESPIRATORY_TRACT | Status: DC

## 2012-09-27 MED ORDER — TRAMADOL HCL 50 MG PO TABS: ORAL_TABLET | ORAL | Status: DC

## 2012-09-27 NOTE — Patient Instructions (Addendum)
symbicort 80 Take 2 puffs first thing in am and then another 2 puffs about 12 hours later.   Increase prilosec to 20 mg 2 Take 30-60 min before first meal of the day   Wean off the inderal   For cough use delsym 2 tsp every 12 hours and supplement with tramadol 50 mg every 4 hours if needed   GERD (REFLUX)  is an extremely common cause of respiratory symptoms, many times with no significant heartburn at all.    It can be treated with medication, but also with lifestyle changes including avoidance of late meals, excessive alcohol, smoking cessation, and avoid fatty foods, chocolate, peppermint, colas, red wine, and acidic juices such as orange juice.  NO MINT OR MENTHOL PRODUCTS SO NO COUGH DROPS  USE SUGARLESS CANDY INSTEAD (jolley ranchers or [employer])  NO OIL BASED VITAMINS - use powdered substitutes.    Please schedule a follow up office visit in 2 weeks, sooner if needed

## 2012-09-27 NOTE — Progress Notes (Signed)
  Subjective:    Patient ID: [name], male    DOB: Dec 12, 1945   MRN: [id number]  HPI  67 yowm quit smoking 1993 with h/o tree allergies age 67 produced seasonal sob and chest tight worse in spring but not requiring maint rx and then onset of same problems starting around 67 2010 around tgiving and worse symptoms year round since 67 2013   So self referred   to pulmonary clinic 09/28/63 for eval of ? Asthma/copd.   09/28/63 1st pulmonary eval / cc  Recurrent chest tightness and sob x 67 years seasonally does fine on cpap x 10 years [facility].  Maintained on inderol 40 mg qid and multiple inhalers including laba/lama > no better.  Albuterol helps 10% .  Prednisone helps a lot.  Assoc with mostly dry cough day > night.   Sob is variably present at rest and with activity, much worse when anxious, much worse last 4 weeks.  No obvious pattern daytime variabilty or assoc   cp or chest tightness, subjective wheeze overt sinus or hb symptoms. No unusual exp hx or h/o childhood pna/ asthma or knowledge of premature birth.   Sleeping ok without nocturnal  or early am exacerbation  of respiratory  c/o's or need for noct saba. Also denies any obvious fluctuation of symptoms with weather or environmental changes or other aggravating or alleviating factors except as outlined above   Review of Systems  Constitutional: Negative for fever, chills, activity change, appetite change and unexpected weight change.  HENT: Negative for congestion, sore throat, rhinorrhea, sneezing, trouble swallowing, dental problem, voice change and postnasal drip.   Eyes: Negative for visual disturbance.  Respiratory: Positive for cough and shortness of breath. Negative for choking.   Cardiovascular: Negative for chest pain and leg swelling.  Gastrointestinal: Negative for nausea, vomiting and abdominal pain.  Genitourinary: Negative for difficulty urinating.  Musculoskeletal: Positive for arthralgias.  Skin: Negative for  rash.  Psychiatric/Behavioral: Negative for behavioral problems and confusion.       Objective:   Physical Exam  amb wm talks very loud and clears voice a lot at times violoently  Wt Readings from Last 3 Encounters:  09/27/12 249 lb (112.946 kg)  04/22/11 257 lb 15 oz (117 kg)  04/22/11 257 lb 15 oz (117 kg)    HEENT: nl dentition, turbinates, and orophanx. Nl external ear canals without cough reflex   NECK :  without JVD/Nodes/TM/ nl carotid upstrokes bilaterally   LUNGS: no acc muscle use, clear to A and P bilaterally without cough on insp or exp maneuvers   CV:  RRR  no s3 or murmur or increase in P2, no edema   ABD:  soft and nontender with nl excursion in the supine position. No bruits or organomegaly, bowel sounds nl  MS:  warm without deformities, calf tenderness, cyanosis or clubbing  SKIN: warm and dry without lesions    NEURO:  alert, approp, no deficits    cxr at [facility] "ok" per pt  around 09/20/12        Assessment & Plan:

## 2012-09-29 NOTE — Assessment & Plan Note (Addendum)
DDX of  difficult airways managment all start with A and  include Adherence, Ace Inhibitors, Acid Reflux, Active Sinus Disease, Alpha 1 Antitripsin deficiency, Anxiety masquerading as Airways dz,  ABPA,  allergy(esp in young), Aspiration (esp in elderly), Adverse effects of DPI,  Active smokers, plus two Bs  = Bronchiectasis and Beta blocker use..and one C= CHF  Adherence is always the initial "prime suspect" and is a multilayered concern that requires a "trust but verify" approach in every patient - starting with knowing how to use medications, especially inhalers, correctly, keeping up with refills and understanding the fundamental difference between maintenance and prns vs those medications only taken for a very short course and then stopped and not refilled.   The proper method of use, as well as anticipated side effects, of a metered-dose inhaler are discussed and demonstrated to the patient. Improved effectiveness after extensive coaching during this visit to a level of approximately  75% so ok to rx with symbicort low strength so as not to irritate the upper airway any more than needed  ? Acid reflux > max rx acid suppression daytime plus diet and encouraged pt not to clear his throat so violently as this just stimulates more GERD by wide swings in gastric pressure.   ? Beta blocker effect > try taper off inderal, the most non-selective BB on the market.

## 2012-10-11 ENCOUNTER — Ambulatory Visit (INDEPENDENT_AMBULATORY_CARE_PROVIDER_SITE_OTHER): Payer: Medicare Other | Admitting: Internal Medicine

## 2012-10-11 ENCOUNTER — Encounter: Payer: Self-pay | Admitting: Internal Medicine

## 2012-10-11 VITALS — BP 94/62 | HR 60 | Temp 97.8°F | Ht 73.0 in | Wt 254.4 lb

## 2012-10-11 DIAGNOSIS — J45909 Unspecified asthma, uncomplicated: Secondary | ICD-10-CM

## 2012-10-11 DIAGNOSIS — R059 Cough, unspecified: Secondary | ICD-10-CM | POA: Diagnosis not present

## 2012-10-11 DIAGNOSIS — R05 Cough: Secondary | ICD-10-CM | POA: Diagnosis not present

## 2012-10-11 NOTE — Progress Notes (Signed)
Subjective:    Patient ID: [name], male    DOB: 1945-06-10   MRN: [id number]    Brief patient profile:  67 yowm quit smoking 1993 with h/o tree allergies age 67 produced seasonal sob and chest tight worse in spring but not requiring maint rx and then onset of same problems starting around 2010 around tgiving and worse symptoms year round since  2013   So self referred   to pulmonary clinic 09/27/2012 for eval of ? Asthma/copd.   HPI 09/27/2012 1st pulmonary eval / cc  Recurrent chest tightness and sob x 4 years seasonally does fine on cpap x 10 years [facility].  Maintained on inderol 40 mg qid and multiple inhalers including laba/lama > no better.  Albuterol helps 10% .  Prednisone helps a lot.  Assoc with mostly dry cough day > night.   Sob is variably present at rest and with activity, much worse when anxious, much worse last 4 weeks. symbicort 80 Take 2 puffs first thing in am and then another 2 puffs about 12 hours later.  Increase prilosec to 20 mg 2 Take 30-60 min before first meal of the day  Wean off the inderal  For cough use delsym 2 tsp every 12 hours and supplement with tramadol 50 mg every 4 hours if needed  GERD diet/   10/11/2012 f/u ov/[name] re cough Chief Complaint  Patient presents with  . Follow-up    Pt states that overall his breathing is much improved since the last visit. He is still clearing his throat, but this is some less since the last visit.   sob completely resolved, not limited by doe from desired activities   No obvious pattern daytime variabilty or    cp or chest tightness, subjective wheeze overt sinus or hb symptoms. No unusual exp hx or h/o childhood pna/ asthma or knowledge of premature birth.   Sleeping ok without nocturnal  or early am exacerbation  of respiratory  c/o's or need for noct saba. Also denies any obvious fluctuation of symptoms with weather or environmental changes or other aggravating or alleviating factors except as outlined  above   Current Medications, Allergies, Past Medical History, Past Surgical History, Family History, and Social History were reviewed in [employer] record.  ROS  The following are not active complaints unless bolded sore throat, dysphagia, dental problems, itching, sneezing,  nasal congestion or excess/ purulent secretions, ear ache,   fever, chills, sweats, unintended wt loss, pleuritic or exertional cp, hemoptysis,  orthopnea pnd or leg swelling, presyncope, palpitations, heartburn, abdominal pain, anorexia, nausea, vomiting, diarrhea  or change in bowel or urinary habits, change in stools or urine, dysuria,hematuria,  rash, arthralgias, visual complaints, headache, numbness weakness or ataxia or problems with walking or coordination,  change in mood/affect or memory.            Objective:   Physical Exam  amb wm talks very loud and clears voice a lot less oftern/ not as  violently   10/11/2012       254  Wt Readings from Last 3 Encounters:  09/27/12 249 lb (112.946 kg)  04/22/11 257 lb 15 oz (117 kg)  04/22/11 257 lb 15 oz (117 kg)    HEENT: nl dentition, turbinates, and orophanx. Nl external ear canals without cough reflex   NECK :  without JVD/Nodes/TM/ nl carotid upstrokes bilaterally   LUNGS: no acc muscle use, clear to A and P bilaterally without cough  on insp or exp maneuvers   CV:  RRR  no s3 or murmur or increase in P2, no edema   ABD:  soft and nontender with nl excursion in the supine position. No bruits or organomegaly, bowel sounds nl  MS:  warm without deformities, calf tenderness, cyanosis or clubbing  SKIN: warm and dry without lesions         cxr at [place] "ok" per pt  around 09/20/12        Assessment & Plan:

## 2012-10-11 NOTE — Patient Instructions (Addendum)
Work on inhaler technique:  relax and gently blow all the way out then take a nice smooth deep breath back in, triggering the inhaler at same time you start breathing in.  Hold for up to 5 seconds if you can.  Rinse and gargle with water when done   If your mouth or throat starts to bother you,   I suggest you time the inhaler to your dental care and after using the inhaler(s) brush teeth and tongue with a baking soda containing toothpaste and when you rinse this out, gargle with it first to see if this helps your mouth and throat.   Only symbicort 80 2 every 12 hours if you feel you need it for any respiratory flare  For throat congestion/ cough try chlortrimeton 4 mg at bedtime and every 6 hours as needed   Please schedule a follow up office visit in 6 weeks, call sooner if needed

## 2012-10-14 NOTE — Assessment & Plan Note (Signed)
Better with gerd rx typical of  Classic Upper airway cough syndrome, so named because it's frequently impossible to sort out how much is  CR/sinusitis with freq throat clearing (which can be related to primary GERD)   vs  causing  secondary (" extra esophageal")  GERD from wide swings in gastric pressure that occur with throat clearing, often  promoting self use of mint and menthol lozenges that reduce the lower esophageal sphincter tone and exacerbate the problem further in a cyclical fashion.   These are the same pts (now being labeled as having "irritable larynx syndrome" by some cough centers) who not infrequently have a history of having failed to tolerate ace inhibitors,  dry powder inhalers or biphosphonates or report having atypical reflux symptoms that don't respond to standard doses of PPI , and are easily confused as having aecopd or asthma flares by even experienced allergists/ pulmonologists.   Will add h1 prn per guidelines and regroup in 6 weeks

## 2012-10-14 NOTE — Assessment & Plan Note (Addendum)
The proper method of use, as well as anticipated side effects, of a metered-dose inhaler are discussed and demonstrated to the patient. Improved effectiveness after extensive coaching during this visit to a level of approximately  75%   Marked improvement in "asthma" symptoms with rx of gerd and off inderal   In fact, All goals of chronic asthma control met including optimal function and elimination of symptoms with minimal need for rescue therapy.  Contingencies discussed in full including contacting this office immediately if not controlling the symptoms using the rule of two's - if continues to meet the rule of 2's can try taper off symbicort and see if flares

## 2012-10-15 ENCOUNTER — Other Ambulatory Visit: Payer: Self-pay | Admitting: Internal Medicine

## 2012-10-23 ENCOUNTER — Telehealth: Payer: Self-pay | Admitting: Neurology

## 2012-10-25 NOTE — Telephone Encounter (Signed)
Called and left messages asking patient or wife to call me back about appointment.

## 2012-10-25 NOTE — Telephone Encounter (Signed)
Spoke to [facility]-Dr. Lou Miner to f/u with Dr. Terrace Arabia. Spouse agreed. Fwd to Dr. Terrace Arabia asst.

## 2012-10-25 NOTE — Telephone Encounter (Signed)
Spouse says patient of Dr. Zannie Cove was recently diagnosed with asthma. Pulmonologist reviewed meds and determined Propanolol 20mg  bid was a direct link to patients asthma/cough. Patient was told to titrate off Propanolol. Patient did as directed and tremors increased. Patient started back taking Propanolol at his own will and began to cough again. Requesting OV or alternate med expedited.

## 2012-10-26 ENCOUNTER — Telehealth: Payer: Self-pay | Admitting: Neurology

## 2012-10-29 ENCOUNTER — Telehealth: Payer: Self-pay | Admitting: Neurology

## 2012-10-30 ENCOUNTER — Encounter: Payer: Self-pay | Admitting: Neurology

## 2012-10-31 ENCOUNTER — Encounter: Payer: Self-pay | Admitting: Neurology

## 2012-10-31 ENCOUNTER — Ambulatory Visit (INDEPENDENT_AMBULATORY_CARE_PROVIDER_SITE_OTHER): Payer: Medicare Other | Admitting: Neurology

## 2012-10-31 VITALS — BP 114/73 | HR 63 | Ht 72.0 in

## 2012-10-31 DIAGNOSIS — M25559 Pain in unspecified hip: Secondary | ICD-10-CM

## 2012-10-31 DIAGNOSIS — R251 Tremor, unspecified: Secondary | ICD-10-CM

## 2012-10-31 DIAGNOSIS — R259 Unspecified abnormal involuntary movements: Secondary | ICD-10-CM | POA: Diagnosis not present

## 2012-10-31 DIAGNOSIS — M25551 Pain in right hip: Secondary | ICD-10-CM

## 2012-10-31 DIAGNOSIS — G4733 Obstructive sleep apnea (adult) (pediatric): Secondary | ICD-10-CM

## 2012-10-31 DIAGNOSIS — R269 Unspecified abnormalities of gait and mobility: Secondary | ICD-10-CM

## 2012-10-31 NOTE — Telephone Encounter (Signed)
Dr.Yan has seen patient. 10/31/2012.

## 2012-10-31 NOTE — Progress Notes (Signed)
History of Present Illness:   [name] is a pleasant 67 year old right-handed Caucasian male, accompanied by his wife at today's clinical visit.  I have seen him in 2011 for bilateral hand tremor, which was considered due to long-term antidepression use, including Lithium and Depakote  He has past medical history of depression, anxiety, bilateral knee replacement, right hip replacement, gastric bypass surgery with weight loss of 130 pounds in the past.  Around 2008, he noticed  gradual onset, but  progressive bilateral hands tremor. He noticed mild hands shaking in his late 57s, he contributed to the medicine side effect, he was given different antidepression over extended period of time, including Lithium, lamitrogen, Valproic acid and also methylphenidate, he also reported history of concussion due to previous a football injury, he did suffered loss of consciousness, and short memory trouble, with one week of  hospital stay in 1967.  His bilateral hands tremor gradually become obvious, despite the change of medications including stopped VPA, putting him back on Lithium, stopping Zoloft, currently he has difficulty with hand writing, using utensils, sometimes click on mouse unintentionally.  He was started on propanolol 20 mg 2 tablets twice a day, he reported mild improvement, he was recently diagnosed with asthma, propanolol increase his cough, and symptoms, he has to stop the propanolol, in addition, he has retired from his job as Designer, television/film set, has worsening depression  He complains of worsening bilateral hands tremor, worsening handwriting, also complains of right hip pain, low back pain, gait difficulty, he denied lost sense of smell, he occasionally acts out of dreams. He is taking Lithium 450 mg 3 tablets every night   He has no children, there was no family history of tremor  He complains of worsening depression since retirement, sitting on the culture watching TV, unhealthy  diet  Review of Systems  Out of a complete 14 system review, the patient complains of only the following symptoms, and all other reviewed systems are negative.  Neurological: Memory loss   Tremor   Sleep: Insomnia   Restless legs   Psychiatric: Depression   Racing thoughts      Social History Patient retired. Patients is marrried. Patients quit tobacco in 1993 patient quit alcohol in 1983 patient drinks 3 cups of coffee daily patient denies any history of illicit drugs. Any Tobacco Use: Quit Inhaled Tobacco Use: former smoker  Family History Patients parents are both deceased cause parents death not listed.  Past Medical History Depression/anxiety  Surgical History bilateral knee replacement, right hip replacement, bariatric surgery     Physical Examination  Caridac: regular rate rhythm  Pulmonary: clear to auscultation bilaterally  Neck: supple no carotid bruits  Neurological Examination  Mental Status:  pleasant, awake, alert, cooperative to history, talking, and casual conversation.  Cranial Nerves:  CN II-XII Pupils were equal round reactive to light.  Extraocular movements were full.  Visual fields were full on confrontational test.  Facial sensation and strength were normal.  Hearing was intact to finger rubbing bilaterally.  Uvula tongue were midline.  Head turning and shoulder shrugging were normal and symmetric.  Tongue protrusion into the cheeks strength were normal.  Motor:  Normal tone, bulk, and strength. he has mild bilateral hand postural tremor, there was no rigidity, bradykinesia.  Deep tendon reflexes:  Biceps: 2/2, Brachioradialis: 2/2, Triceps: 2/2, Patellar: 2/2, Achilles: 2/2.  Plantar responses were flexor.  Sensory:  Normal to light touch, pinprick,   Cordination:  Normal finger-to-nose, heel-to-shin.  There was no dysmetria noticed.  Gait:  Mild atalgic, dragging right leg,  Assessment and plan:   67 year old Caucasian male, with bilateral hands  tremor,Most likely due to long-term lithium use, he is currently taking 450 mg 3 tablets every night, reported level of 0.9,   1. His examination and history are most consistent with long-term medication use, including lithium, previously Depakote 2. His history and exam do not suggestive of Parkinson's disease 3. I will not treat him with any medication at this point, I have referred him for physical therapy, I also encouraged him to join a gym for regular exercise,

## 2012-11-02 ENCOUNTER — Ambulatory Visit (INDEPENDENT_AMBULATORY_CARE_PROVIDER_SITE_OTHER): Payer: Medicare Other | Admitting: Pulmonary Disease

## 2012-11-02 ENCOUNTER — Encounter: Payer: Self-pay | Admitting: Pulmonary Disease

## 2012-11-02 VITALS — BP 110/68 | HR 70 | Temp 98.4°F | Ht 74.0 in | Wt 259.6 lb

## 2012-11-02 DIAGNOSIS — G4733 Obstructive sleep apnea (adult) (pediatric): Secondary | ICD-10-CM

## 2012-11-02 NOTE — Progress Notes (Signed)
Subjective:    Patient ID: Ronald Ply., male    DOB: 1945/07/11, 67 y.o.   MRN: [id number]  HPI The patient is a 67 year old male who I've been asked to see for management of obstructive sleep apnea.  He was initially diagnosed in 1996, and started on CPAP, and I saw him in 2008 per titration study optimized his pressure to 14 cm of water.  He has had bariatric surgery, and has lost a very significant amount of weight, and he tells me that his current pressure is 10-11 cm.  The patient got a new machine around 2008, and currently this device has stopped working.  He also has an old mask that is in need of replacement.  The patient feels that he sleeps well during the night with the CPAP, and is rested in the mornings upon arising.  He feels his alertness is adequate during the day, although his wife does note some napping during the day.  He denies any sleepiness with driving.  The patient lost significant weight after his bariatric surgery, and his Epworth score today is only 2.     Sleep Questionnaire What time do you typically go to bed?( Between what hours) 10 PM 10 PM at 1154 on 11/02/12 by Hermelinda Medicus How long does it take you to fall asleep? 15-30 minutes 15-30 minutes at 1154 on 11/02/12 by Hermelinda Medicus How many times during the night do you wake up? 2 2 at 1154 on 11/02/12 by Hermelinda Medicus What time do you get out of bed to start your day? Do you drive or operate heavy machinery in your occupation? No No at 1154 on 11/02/12 by Hermelinda Medicus How much has your weight changed (up or down) over the past two years? (In pounds) 10 lb (4.536 kg) 10 lb (4.536 kg) at 1154 on 11/02/12 by Hermelinda Medicus Have you ever had a sleep study before? Yes Yes at 1154 on 11/02/12 by Hermelinda Medicus If yes, location of study? [facility] at 1154 on 11/02/12 by Hermelinda Medicus If yes, date of study? 2008 2008 at 1154 on 11/02/12 by Hermelinda Medicus Do you currently  use CPAP? Yes Yes at 1154 on 11/02/12 by Hermelinda Medicus If so, what pressure? 10.6 CM  10.6 CM  at 1154 on 11/02/12 by Hermelinda Medicus Do you wear oxygen at any time? No No at 1154 on 11/02/12 by Hermelinda Medicus   Review of Systems  Constitutional: Positive for unexpected weight change. Negative for fever.  HENT: Negative for ear pain, nosebleeds, congestion, sore throat, rhinorrhea, sneezing, trouble swallowing, dental problem, postnasal drip and sinus pressure.   Eyes: Negative for redness and itching.  Respiratory: Positive for cough and shortness of breath. Negative for chest tightness and wheezing.   Cardiovascular: Negative for palpitations and leg swelling.  Gastrointestinal: Negative for nausea and vomiting.  Genitourinary: Negative for dysuria.  Musculoskeletal: Negative for joint swelling.  Skin: Negative for rash.  Neurological: Negative for headaches.  Hematological: Does not bruise/bleed easily.  Psychiatric/Behavioral: Positive for dysphoric mood. The patient is nervous/anxious.        Objective:   Physical Exam Constitutional:  Overweight male, no acute distress  HENT:  Nares patent without discharge, septal deviation to left with narrowing.  Oropharynx without exudate, palate and uvula are moderately elongated  Eyes:  Perrla, eomi, no scleral icterus  Neck:  No JVD, no TMG  Cardiovascular:  Normal rate, regular  rhythm, no rubs or gallops.  No murmurs        Intact distal pulses  Pulmonary :  Normal breath sounds, no stridor or respiratory distress   No rales, rhonchi, or wheezing  Abdominal:  Soft, nondistended, bowel sounds present.  No tenderness noted.   Musculoskeletal:  No lower extremity edema noted.  Lymph Nodes:  No cervical lymphadenopathy noted  Skin:  No cyanosis noted  Neurologic:  Alert, appropriate, moves all 4 extremities without obvious deficit.         Assessment & Plan:

## 2012-11-02 NOTE — Assessment & Plan Note (Signed)
The patient has a history of significant sleep apnea, but has lost weight after bariatric surgery.  He has not lost enough to resolve his sleep disorder breathing, and is in need of a new machine after his old device stopped working.  Will take this opportunity to optimize his pressure again on the automatic setting, and I have encouraged him to continue working on aggressive weight loss.  I will let him know that his optimal pressure, but he will have the option of a fixed or automatic pressure setting.

## 2012-11-02 NOTE — Patient Instructions (Addendum)
Will get you a new cpap machine, and use the auto setting to optimize pressure.  You can then have the option of staying on auto or being on a fixed pressure that has been optimized. Work on weight loss followup with me again in one year if doing well.  Call us for issues, and also if you do not hear from [place] once your download is complete.

## 2012-11-07 ENCOUNTER — Telehealth: Payer: Self-pay | Admitting: Pulmonary Disease

## 2012-11-07 ENCOUNTER — Telehealth: Payer: Self-pay | Admitting: Neurology

## 2012-11-07 NOTE — Telephone Encounter (Signed)
Spoke with Jan at [place] She states that they are needing clinical notes from [facility] and also they faxed the order back and need [place] to sign and date this  She states that someone from there has already spoken with Almyra Free regarding this  Will ask her if she has the form for [facility] spouse, NA and mailbox not set up yet so unable to let her know we are working on this Lib, please advise thanks!

## 2012-11-07 NOTE — Telephone Encounter (Signed)
Spoke to wife and to [email or web address]  [email or web address] is to be set up Thursday 11/08/12@4 :30pm Tobe Sos

## 2012-11-09 ENCOUNTER — Telehealth: Payer: Self-pay | Admitting: Neurology

## 2012-11-09 NOTE — Telephone Encounter (Signed)
I called and left patient's wife a VM. The PT referral was made on August 20. It has been approved by insurance. The referral is to the PT office that is in the same building as [facility]. They will call to schedule but, if you would like to call, their number is [phone].

## 2012-11-09 NOTE — Telephone Encounter (Signed)
Called patient [name]  Asking him to call back, to let him know his referral has been processed. He should be getting a call from the place sandy p sent him

## 2012-11-19 DIAGNOSIS — E119 Type 2 diabetes mellitus without complications: Secondary | ICD-10-CM | POA: Diagnosis not present

## 2012-11-21 DIAGNOSIS — M5126 Other intervertebral disc displacement, lumbar region: Secondary | ICD-10-CM | POA: Diagnosis not present

## 2012-11-22 ENCOUNTER — Ambulatory Visit (INDEPENDENT_AMBULATORY_CARE_PROVIDER_SITE_OTHER): Payer: Medicare Other | Admitting: Internal Medicine

## 2012-11-22 ENCOUNTER — Ambulatory Visit: Payer: Medicare Other | Attending: Neurology | Admitting: Physical Therapy

## 2012-11-22 ENCOUNTER — Encounter: Payer: Self-pay | Admitting: Internal Medicine

## 2012-11-22 VITALS — BP 128/84 | HR 83 | Temp 97.9°F | Ht 74.0 in | Wt 259.0 lb

## 2012-11-22 DIAGNOSIS — R5381 Other malaise: Secondary | ICD-10-CM | POA: Insufficient documentation

## 2012-11-22 DIAGNOSIS — IMO0001 Reserved for inherently not codable concepts without codable children: Secondary | ICD-10-CM | POA: Insufficient documentation

## 2012-11-22 DIAGNOSIS — J45909 Unspecified asthma, uncomplicated: Secondary | ICD-10-CM | POA: Diagnosis not present

## 2012-11-22 DIAGNOSIS — R059 Cough, unspecified: Secondary | ICD-10-CM

## 2012-11-22 DIAGNOSIS — R42 Dizziness and giddiness: Secondary | ICD-10-CM | POA: Insufficient documentation

## 2012-11-22 DIAGNOSIS — R05 Cough: Secondary | ICD-10-CM

## 2012-11-22 MED ORDER — PREDNISONE (PAK) 10 MG PO TABS: ORAL_TABLET | ORAL | Status: DC

## 2012-11-22 NOTE — Progress Notes (Signed)
Subjective:    Patient ID: [name], male    DOB: [birth year]   MRN: [id number]    Brief patient profile:  62 yowm quit smoking 1993 with h/o tree allergies age 67 produced seasonal sob and chest tight worse in spring but not requiring maint rx and then onset of same problems starting around 2010 around tgiving and worse symptoms year round since  2013   So self referred   to pulmonary clinic 09/27/2012 for eval of ? Asthma/copd.   HPI 09/27/2012 1st pulmonary eval / cc  Recurrent chest tightness and sob x 4 years seasonally does fine on cpap x 10 years [facility] [place].  Maintained on inderol 40 mg qid and multiple inhalers including laba/lama > no better.  Albuterol helps 10% .  Prednisone helps a lot.  Assoc with mostly dry cough day > night.   Sob is variably present at rest and with activity, much worse when anxious, much worse last 4 weeks. symbicort 80 Take 2 puffs first thing in am and then another 2 puffs about 12 hours later.  Increase prilosec to 20 mg 2 Take 30-60 min before first meal of the day  Wean off the inderal  For cough use delsym 2 tsp every 12 hours and supplement with tramadol 50 mg every 4 hours if needed  GERD diet/   10/11/2012 f/u ov/Wert re cough Chief Complaint  Patient presents with  . Follow-up    Pt states that overall his breathing is much improved since the last visit. He is still clearing his throat, but this is some less since the last visit.   sob completely resolved, not limited by doe from desired activities  rec Work on inhaler technique:   Only use  symbicort 80 2 every 12 hours if you feel you need it for any respiratory flare For throat congestion/ cough try chlortrimeton 4 mg at bedtime and every 6 hours as needed    11/22/2012 f/u ov/Wert  Chief Complaint  Patient presents with  . Follow-up    Pt states breathing is doing well- taking symbicort 2 puffs every am and 1 puff at night. Cough is doing better, but he is taking 100 mg tramadol  at least once per day.    breathing fine through the day, worse as day goes on and hacking in evening p one dose symbicort, relieved by cpap > no cough/ no breathing problem at all while sleeping.  Breathing def better off inderal, cough is some better. 1st gen H1 during the day ? Not clear it's helping > mucus is clear   No obvious pattern daytime variabilty or   cp or chest tightness, subjective wheeze overt sinus or hb symptoms. No unusual exp hx or h/o childhood pna/ asthma or knowledge of premature birth.   Sleeping ok without nocturnal  or early am exacerbation  of respiratory  c/o's or need for noct saba. Also denies any obvious fluctuation of symptoms with weather or environmental changes or other aggravating or alleviating factors except as outlined above   Current Medications, Allergies, Past Medical History, Past Surgical History, Family History, and Social History were reviewed in [employer] record.  ROS  The following are not active complaints unless bolded sore throat, dysphagia, dental problems, itching, sneezing,  nasal congestion or excess/ purulent secretions, ear ache,   fever, chills, sweats, unintended wt loss, pleuritic or exertional cp, hemoptysis,  orthopnea pnd or leg swelling, presyncope, palpitations, heartburn, abdominal pain, anorexia,  nausea, vomiting, diarrhea  or change in bowel or urinary habits, change in stools or urine, dysuria,hematuria,  rash, arthralgias, visual complaints, headache, numbness weakness or ataxia or problems with walking or coordination,  change in mood/affect or memory.            Objective:   Physical Exam  amb wm talks very loud and clears voice a lot less often/ not as  violently  11/22/2012       259  10/11/2012       254  Wt Readings from Last 3 Encounters:  09/27/12 249 lb (112.946 kg)  04/22/11 257 lb 15 oz (117 kg)  04/22/11 257 lb 15 oz (117 kg)    HEENT: nl dentition, turbinates, and orophanx. Nl  external ear canals without cough reflex   NECK :  without JVD/Nodes/TM/ nl carotid upstrokes bilaterally   LUNGS: no acc muscle use, clear to A and P bilaterally without cough on insp or exp maneuvers   CV:  RRR  no s3 or murmur or increase in P2, no edema   ABD:  soft and nontender with nl excursion in the supine position. No bruits or organomegaly, bowel sounds nl  MS:  warm without deformities, calf tenderness, cyanosis or clubbing  SKIN: warm and dry without lesions         cxr at [facility] "[facility]" per pt  around 09/20/12        Assessment & Plan:

## 2012-11-22 NOTE — Patient Instructions (Addendum)
Please see patient coordinator before you leave today  to schedule sinus CT   Take delsym two tsp every 12 hours and supplement if needed with  tramadol 50 mg up to 2 every 4 hours to suppress the even the urge to cough. Swallowing water or using ice chips/non mint and menthol containing candies (such as lifesavers or sugarless jolly ranchers) are also effective.  You should rest your voice and avoid activities that you know make you cough.  Once you have eliminated the cough for 3 straight days try reducing the tramadol first,  then the delsym as tolerated.    Continue symbicort 80 Take 2 puffs first thing in am and then another 2 puffs about 12 hours later but work on your technique - smooth and deep  Prednisone 10 mg take  4 each am x 2 days,   2 each am x 2 days,  1 each am x 2 days and stop   Please schedule a follow up office visit in 4 weeks, sooner if needed

## 2012-11-23 ENCOUNTER — Telehealth: Payer: Self-pay | Admitting: Internal Medicine

## 2012-11-23 ENCOUNTER — Ambulatory Visit (INDEPENDENT_AMBULATORY_CARE_PROVIDER_SITE_OTHER)
Admission: RE | Admit: 2012-11-23 | Discharge: 2012-11-23 | Disposition: A | Discharge status: Home / Self Care | Payer: Medicare Other | Source: Ambulatory Visit | Attending: Internal Medicine | Admitting: Internal Medicine

## 2012-11-23 DIAGNOSIS — J45909 Unspecified asthma, uncomplicated: Secondary | ICD-10-CM

## 2012-11-23 DIAGNOSIS — R05 Cough: Secondary | ICD-10-CM | POA: Diagnosis not present

## 2012-11-23 DIAGNOSIS — R059 Cough, unspecified: Secondary | ICD-10-CM | POA: Diagnosis not present

## 2012-11-23 MED ORDER — PREDNISONE (PAK) 10 MG PO TABS: ORAL_TABLET | ORAL | Status: DC

## 2012-11-23 NOTE — Assessment & Plan Note (Signed)
Lack of cough resolution could mean an alternative diagnosis, persistence of the disease state (eg bronchiectasis or sinusitis), or inadequacy of currently available therapy (eg no rx available for non-acid gerd) -  Will check sinus ct next and continue max rx for acid/ nonacid gerd/ cyclical cough acknowledging he has yet to achieve full control of the cough and urge to clear the throat so unlikely to eliminate cyclical component, reviewed.

## 2012-11-23 NOTE — Telephone Encounter (Signed)
RX was sent to [facility]. I have called ot get this cancelled. I spoke with shane and this was cancelled I have sent rx to the [facility] as advised. lmtcb x1 for pt

## 2012-11-23 NOTE — Telephone Encounter (Signed)
Spoke with the pt and notified of below recs per Mindy Pt verbalized understanding and states nothing further needed

## 2012-11-23 NOTE — Assessment & Plan Note (Signed)
Still not clear this is asthma vs  Classic Upper airway cough syndrome, so named because it's frequently impossible to sort out how much is  CR/sinusitis with freq throat clearing (which can be related to primary GERD)   vs  causing  secondary (" extra esophageal")  GERD from wide swings in gastric pressure that occur with throat clearing, often  promoting self use of mint and menthol lozenges that reduce the lower esophageal sphincter tone and exacerbate the problem further in a cyclical fashion.   These are the same pts (now being labeled as having "irritable larynx syndrome" by some cough centers) who not infrequently have a history of having failed to tolerate ace inhibitors,  dry powder inhalers or biphosphonates or report having atypical reflux symptoms that don't respond to standard doses of PPI , and are easily confused as having aecopd or asthma flares by even experienced allergists/ pulmonologists.   For now try to eliminate cyclical cough and use the lower dose of symbicort to address ? Asthmatic component  The proper method of use, as well as anticipated side effects, of a metered-dose inhaler are discussed and demonstrated to the patient. Improved effectiveness after extensive coaching during this visit to a level of approximately  50% but no better    Each maintenance medication was reviewed in detail including most importantly the difference between maintenance and as needed and under what circumstances the prns are to be used.  Please see instructions for details which were reviewed in writing and the patient given a copy.

## 2012-11-24 ENCOUNTER — Encounter: Payer: Self-pay | Admitting: Internal Medicine

## 2012-11-26 DIAGNOSIS — Z79899 Other long term (current) drug therapy: Secondary | ICD-10-CM | POA: Diagnosis not present

## 2012-11-26 DIAGNOSIS — R0609 Other forms of dyspnea: Secondary | ICD-10-CM | POA: Diagnosis not present

## 2012-11-26 DIAGNOSIS — E119 Type 2 diabetes mellitus without complications: Secondary | ICD-10-CM | POA: Diagnosis not present

## 2012-11-26 DIAGNOSIS — M6281 Muscle weakness (generalized): Secondary | ICD-10-CM | POA: Diagnosis not present

## 2012-11-26 DIAGNOSIS — Z980 Intestinal bypass and anastomosis status: Secondary | ICD-10-CM | POA: Diagnosis not present

## 2012-11-26 DIAGNOSIS — IMO0002 Reserved for concepts with insufficient information to code with codable children: Secondary | ICD-10-CM | POA: Diagnosis not present

## 2012-11-27 ENCOUNTER — Ambulatory Visit: Payer: Medicare Other

## 2012-11-28 ENCOUNTER — Other Ambulatory Visit: Payer: Self-pay | Admitting: Internal Medicine

## 2012-11-28 MED ORDER — AMOXICILLIN-POT CLAVULANATE 875-125 MG PO TABS: 1.0000 | ORAL_TABLET | Freq: Two times a day (BID) | ORAL | Status: DC

## 2012-11-28 NOTE — Progress Notes (Signed)
Quick Note:  Spoke with pt and notified of results per Dr. Wert. Pt verbalized understanding and denied any questions.  ______ 

## 2012-11-29 DIAGNOSIS — M76899 Other specified enthesopathies of unspecified lower limb, excluding foot: Secondary | ICD-10-CM | POA: Diagnosis not present

## 2012-11-30 ENCOUNTER — Ambulatory Visit: Payer: Medicare Other | Admitting: *Deleted

## 2012-12-04 ENCOUNTER — Ambulatory Visit: Payer: Medicare Other | Admitting: *Deleted

## 2012-12-06 ENCOUNTER — Ambulatory Visit: Payer: Medicare Other | Admitting: *Deleted

## 2012-12-11 ENCOUNTER — Ambulatory Visit: Payer: Medicare Other | Admitting: *Deleted

## 2012-12-13 ENCOUNTER — Ambulatory Visit: Payer: Medicare Other | Attending: Neurology | Admitting: *Deleted

## 2012-12-13 DIAGNOSIS — IMO0001 Reserved for inherently not codable concepts without codable children: Secondary | ICD-10-CM | POA: Diagnosis not present

## 2012-12-13 DIAGNOSIS — R42 Dizziness and giddiness: Secondary | ICD-10-CM | POA: Insufficient documentation

## 2012-12-13 DIAGNOSIS — R5381 Other malaise: Secondary | ICD-10-CM | POA: Diagnosis not present

## 2012-12-18 ENCOUNTER — Ambulatory Visit: Payer: Medicare Other

## 2012-12-18 DIAGNOSIS — IMO0001 Reserved for inherently not codable concepts without codable children: Secondary | ICD-10-CM | POA: Diagnosis not present

## 2012-12-18 DIAGNOSIS — R5381 Other malaise: Secondary | ICD-10-CM | POA: Diagnosis not present

## 2012-12-18 DIAGNOSIS — R42 Dizziness and giddiness: Secondary | ICD-10-CM | POA: Diagnosis not present

## 2012-12-20 ENCOUNTER — Ambulatory Visit (INDEPENDENT_AMBULATORY_CARE_PROVIDER_SITE_OTHER): Payer: Medicare Other | Admitting: Internal Medicine

## 2012-12-20 ENCOUNTER — Encounter: Payer: Self-pay | Admitting: Internal Medicine

## 2012-12-20 VITALS — BP 126/82 | HR 63 | Temp 98.2°F | Ht 74.0 in | Wt 266.4 lb

## 2012-12-20 DIAGNOSIS — Z23 Encounter for immunization: Secondary | ICD-10-CM

## 2012-12-20 DIAGNOSIS — R05 Cough: Secondary | ICD-10-CM | POA: Diagnosis not present

## 2012-12-20 DIAGNOSIS — J45909 Unspecified asthma, uncomplicated: Secondary | ICD-10-CM | POA: Diagnosis not present

## 2012-12-20 DIAGNOSIS — J329 Chronic sinusitis, unspecified: Secondary | ICD-10-CM

## 2012-12-20 DIAGNOSIS — R059 Cough, unspecified: Secondary | ICD-10-CM

## 2012-12-20 NOTE — Patient Instructions (Addendum)
Please see patient coordinator before you leave today  to schedule ent eval  Once your cough and breathing have normalized you can try tapering off the symbicort 80 but restart immediately if worsen off it   If you are satisfied with your treatment plan let your doctor know and he/she can either refill your medications or you can return here when your prescription runs out.     If in any way you are not 100% satisfied,  please tell us.  If 100% better, tell your friends!

## 2012-12-20 NOTE — Progress Notes (Signed)
Subjective:    Patient ID: [name], male    DOB: Mar 09, 1946   MRN: [id number]    Brief patient profile:  67 yowm quit smoking 1993 with h/o tree allergies age 67 produced seasonal sob and chest tight worse in spring but not requiring maint rx and then onset of same problems starting around 2010 around tgiving and worse symptoms year round since  2013   So self referred  to pulmonary clinic 09/27/2012 for eval of ? Asthma/copd.   HPI 09/27/2012 1st pulmonary eval / cc  Recurrent chest tightness and sob x 4 years seasonally does fine on cpap x 10 years [facility] [place].  Maintained on inderol 40 mg qid and multiple inhalers including laba/lama > no better.  Albuterol helps 10% .  Prednisone helps a lot.  Assoc with mostly dry cough day > night.   Sob is variably present at rest and with activity, much worse when anxious, much worse last 4 weeks. symbicort 80 Take 2 puffs first thing in am and then another 2 puffs about 12 hours later.  Increase prilosec to 20 mg 2 Take 30-60 min before first meal of the day  Wean off the inderal  For cough use delsym 2 tsp every 12 hours and supplement with tramadol 50 mg every 4 hours if needed  GERD diet/   10/11/2012 f/u ov/[name] re cough Chief Complaint  Patient presents with  . Follow-up    Pt states that overall his breathing is much improved since the last visit. He is still clearing his throat, but this is some less since the last visit.   sob completely resolved, not limited by doe from desired activities  rec Work on inhaler technique:   Only use  symbicort 80 2 every 12 hours if you feel you need it for any respiratory flare For throat congestion/ cough try chlortrimeton 4 mg at bedtime and every 6 hours as needed    11/22/2012 f/u ov/[name]  Chief Complaint  Patient presents with  . Follow-up    Pt states breathing is doing well- taking symbicort 2 puffs every am and 1 puff at night. Cough is doing better, but he is taking 100 mg tramadol  at least once per day.    breathing fine through the day, worse as day goes on and hacking in evening p one dose symbicort, relieved by cpap > no cough/ no breathing problem at all while sleeping.  Breathing def better off inderal, cough is some better. 1st gen H1 during the day ? Not clear it's helping > mucus is clear  rec  Take delsym two tsp every 12 hours and supplement if needed with  tramadol 50 mg up to 2 every 4 hours to suppress the even the urge to cough.   continue symbicort 80 Take 2 puffs first thing in am and then another 2 puffs about 12 hours later but work on your technique - smooth and deep Prednisone 10 mg take  4 each am x 2 days,   2 each am x 2 days,  1 each am x 2 days and stop  Sinus CT 11/23/12 > severe sinusitis > rx x 21 days augmentin   12/20/2012 f/u ov/[name] re: chronic cough/ ? All sinus related?  Chief Complaint  Patient presents with  . Follow-up    still feels like can't take a deep breath,had ct sinus 2 wks. ago,feels pr. in sinus ,pnd,cough-dry occass,feels like something in throat,sob slightly better,occass. wheezing,mid chest  tightness occass.,wants flu shot if ok      No obvious pattern daytime variabilty or   cp or chest tightness, subjective wheeze overt   hb symptoms. No unusual exp hx or h/o childhood pna/ asthma or knowledge of premature birth.   Sleeping ok without nocturnal  or early am exacerbation  of respiratory  c/o's or need for noct saba. Also denies any obvious fluctuation of symptoms with weather or environmental changes or other aggravating or alleviating factors except as outlined above   Current Medications, Allergies, Past Medical History, Past Surgical History, Family History, and Social History were reviewed in [employer] record.  ROS  The following are not active complaints unless bolded sore throat, dysphagia, dental problems, itching, sneezing,  nasal congestion or excess/ purulent secretions, ear ache,    fever, chills, sweats, unintended wt loss, pleuritic or exertional cp, hemoptysis,  orthopnea pnd or leg swelling, presyncope, palpitations, heartburn, abdominal pain, anorexia, nausea, vomiting, diarrhea  or change in bowel or urinary habits, change in stools or urine, dysuria,hematuria,  rash, arthralgias, visual complaints, headache, numbness weakness or ataxia or problems with walking or coordination,  change in mood/affect or memory.            Objective:   Physical Exam  amb wm talks very loud with nasal tone to voice   12/20/2012       266  11/22/2012       259  10/11/2012       254  Wt Readings from Last 3 Encounters:  09/27/12 249 lb (112.946 kg)  04/22/11 257 lb 15 oz (117 kg)  04/22/11 257 lb 15 oz (117 kg)    HEENT: nl dentition, turbinates, and orophanx. Nl external ear canals without cough reflex   NECK :  without JVD/Nodes/TM/ nl carotid upstrokes bilaterally   LUNGS: no acc muscle use, clear to A and P bilaterally without cough on insp or exp maneuvers   CV:  RRR  no s3 or murmur or increase in P2, no edema   ABD:  soft and nontender with nl excursion in the supine position. No bruits or organomegaly, bowel sounds nl  MS:  warm without deformities, calf tenderness, cyanosis or clubbing  SKIN: warm and dry without lesions         cxr at [facility] "[facility]" per pt  around 09/20/12        Assessment & Plan:

## 2012-12-21 ENCOUNTER — Ambulatory Visit: Payer: Medicare Other

## 2012-12-21 DIAGNOSIS — R42 Dizziness and giddiness: Secondary | ICD-10-CM | POA: Diagnosis not present

## 2012-12-21 DIAGNOSIS — IMO0001 Reserved for inherently not codable concepts without codable children: Secondary | ICD-10-CM | POA: Diagnosis not present

## 2012-12-21 DIAGNOSIS — R5381 Other malaise: Secondary | ICD-10-CM | POA: Diagnosis not present

## 2012-12-23 NOTE — Assessment & Plan Note (Addendum)
-   Sinus CT 11/23/12 > Nearly pansinus paranasal sinus inflammatory changes with fluid levels and bubbly opacity most compatible with acute sinusitis> rx with 21 d augmentin> improved but still symptomatic  This is the most likely cause of his cough and breathing problems; in fact, once it's addressed completely, can probably try taper off symbicort to see what if any symptoms flare.

## 2012-12-25 ENCOUNTER — Ambulatory Visit: Payer: Medicare Other | Admitting: Physical Therapy

## 2012-12-25 DIAGNOSIS — IMO0001 Reserved for inherently not codable concepts without codable children: Secondary | ICD-10-CM | POA: Diagnosis not present

## 2012-12-25 DIAGNOSIS — R42 Dizziness and giddiness: Secondary | ICD-10-CM | POA: Diagnosis not present

## 2012-12-25 DIAGNOSIS — R5381 Other malaise: Secondary | ICD-10-CM | POA: Diagnosis not present

## 2012-12-26 DIAGNOSIS — J328 Other chronic sinusitis: Secondary | ICD-10-CM | POA: Diagnosis not present

## 2012-12-26 DIAGNOSIS — G4733 Obstructive sleep apnea (adult) (pediatric): Secondary | ICD-10-CM | POA: Diagnosis not present

## 2012-12-27 ENCOUNTER — Ambulatory Visit: Payer: Medicare Other | Admitting: *Deleted

## 2013-01-23 DIAGNOSIS — J328 Other chronic sinusitis: Secondary | ICD-10-CM | POA: Diagnosis not present

## 2013-01-30 ENCOUNTER — Other Ambulatory Visit: Payer: Self-pay | Admitting: Pulmonary Disease

## 2013-01-30 DIAGNOSIS — G4733 Obstructive sleep apnea (adult) (pediatric): Secondary | ICD-10-CM

## 2013-02-01 DIAGNOSIS — F3175 Bipolar disorder, in partial remission, most recent episode depressed: Secondary | ICD-10-CM | POA: Diagnosis not present

## 2013-02-11 ENCOUNTER — Other Ambulatory Visit: Payer: Self-pay | Admitting: Neurological Surgery

## 2013-02-11 DIAGNOSIS — M549 Dorsalgia, unspecified: Secondary | ICD-10-CM

## 2013-02-20 ENCOUNTER — Ambulatory Visit
Admission: RE | Admit: 2013-02-20 | Discharge: 2013-02-20 | Disposition: A | Discharge status: Home / Self Care | Payer: Medicare Other | Source: Ambulatory Visit | Attending: Neurological Surgery | Admitting: Neurological Surgery

## 2013-02-20 DIAGNOSIS — M5126 Other intervertebral disc displacement, lumbar region: Secondary | ICD-10-CM | POA: Diagnosis not present

## 2013-02-20 DIAGNOSIS — M549 Dorsalgia, unspecified: Secondary | ICD-10-CM

## 2013-02-20 DIAGNOSIS — M4310 Spondylolisthesis, site unspecified: Secondary | ICD-10-CM | POA: Diagnosis not present

## 2013-02-20 DIAGNOSIS — M47814 Spondylosis without myelopathy or radiculopathy, thoracic region: Secondary | ICD-10-CM | POA: Diagnosis not present

## 2013-02-21 DIAGNOSIS — J309 Allergic rhinitis, unspecified: Secondary | ICD-10-CM | POA: Diagnosis not present

## 2013-02-21 DIAGNOSIS — R05 Cough: Secondary | ICD-10-CM | POA: Diagnosis not present

## 2013-02-21 DIAGNOSIS — R059 Cough, unspecified: Secondary | ICD-10-CM | POA: Diagnosis not present

## 2013-02-22 DIAGNOSIS — R209 Unspecified disturbances of skin sensation: Secondary | ICD-10-CM | POA: Diagnosis not present

## 2013-02-22 DIAGNOSIS — M549 Dorsalgia, unspecified: Secondary | ICD-10-CM | POA: Diagnosis not present

## 2013-02-25 DIAGNOSIS — R0609 Other forms of dyspnea: Secondary | ICD-10-CM | POA: Diagnosis not present

## 2013-02-25 DIAGNOSIS — E119 Type 2 diabetes mellitus without complications: Secondary | ICD-10-CM | POA: Diagnosis not present

## 2013-02-25 DIAGNOSIS — M79609 Pain in unspecified limb: Secondary | ICD-10-CM | POA: Diagnosis not present

## 2013-03-04 ENCOUNTER — Other Ambulatory Visit: Payer: Self-pay | Admitting: Neurological Surgery

## 2013-03-04 ENCOUNTER — Ambulatory Visit
Admission: RE | Admit: 2013-03-04 | Discharge: 2013-03-04 | Disposition: A | Discharge status: Home / Self Care | Payer: Medicare Other | Source: Ambulatory Visit | Attending: Neurological Surgery | Admitting: Neurological Surgery

## 2013-03-04 DIAGNOSIS — E669 Obesity, unspecified: Secondary | ICD-10-CM | POA: Diagnosis not present

## 2013-03-04 DIAGNOSIS — R269 Unspecified abnormalities of gait and mobility: Secondary | ICD-10-CM

## 2013-03-04 DIAGNOSIS — M549 Dorsalgia, unspecified: Secondary | ICD-10-CM | POA: Diagnosis not present

## 2013-03-06 DIAGNOSIS — R269 Unspecified abnormalities of gait and mobility: Secondary | ICD-10-CM | POA: Diagnosis not present

## 2013-03-06 DIAGNOSIS — E663 Overweight: Secondary | ICD-10-CM | POA: Diagnosis not present

## 2013-03-08 ENCOUNTER — Other Ambulatory Visit: Payer: Self-pay | Admitting: Neurological Surgery

## 2013-03-08 DIAGNOSIS — R269 Unspecified abnormalities of gait and mobility: Secondary | ICD-10-CM

## 2013-03-13 ENCOUNTER — Ambulatory Visit
Admission: RE | Admit: 2013-03-13 | Discharge: 2013-03-13 | Disposition: A | Discharge status: Home / Self Care | Payer: Medicare Other | Source: Ambulatory Visit | Attending: Neurological Surgery | Admitting: Neurological Surgery

## 2013-03-13 DIAGNOSIS — R269 Unspecified abnormalities of gait and mobility: Secondary | ICD-10-CM

## 2013-03-13 DIAGNOSIS — M5020 Other cervical disc displacement, unspecified cervical region: Secondary | ICD-10-CM | POA: Diagnosis not present

## 2013-03-13 DIAGNOSIS — M47812 Spondylosis without myelopathy or radiculopathy, cervical region: Secondary | ICD-10-CM | POA: Diagnosis not present

## 2013-03-22 DIAGNOSIS — Q762 Congenital spondylolisthesis: Secondary | ICD-10-CM | POA: Diagnosis not present

## 2013-03-22 DIAGNOSIS — E669 Obesity, unspecified: Secondary | ICD-10-CM | POA: Diagnosis not present

## 2013-03-26 ENCOUNTER — Encounter (HOSPITAL_COMMUNITY): Payer: Self-pay | Admitting: Pharmacy Technician

## 2013-03-27 ENCOUNTER — Other Ambulatory Visit: Payer: Self-pay | Admitting: Neurological Surgery

## 2013-03-28 NOTE — Pre-Procedure Instructions (Signed)
Norva Pavlov.  03/28/2013   Your procedure is scheduled on:  Wednesday April 03, 2013 at 1:46 PM.  Report to [facility] Short Stay Entrance "A"  Admitting at 11:45 AM.  Call this number if you have problems the morning of surgery: [phone]   Remember:   Do not eat food or drink liquids after midnight.   Take these medicines the morning of surgery with A SIP OF WATER: Albuterol inhaler if needed for shortness of breath, Symbicort inhaler, Methylphenidate (Concerta), Omeprazole (Prilosec), Tramadol (Ultram) if needed for pain, Xanax, and Dutasteride-Tamsulosin.   Do NOT take any diabetic medications the day of your surgery   Do not wear jewelry.  Do not wear lotions, powders, or colognes. You may wear deodorant.  Men may shave face and neck.  Do not bring valuables to the hospital.  [facility] is not responsible for any belongings or valuables.               Contacts, dentures or bridgework may not be worn into surgery.  Leave suitcase in the car. After surgery it may be brought to your room.  For patients admitted to the hospital, discharge time is determined by your treatment team.               Patients discharged the day of surgery will not be allowed to drive home.  Name and phone number of your driver: Family/Friend  Special Instructions: Shower the night before your surgery and the morning of your surgery using CHG soap.   Please read over the following fact sheets that you were given: Pain Booklet, Coughing and Deep Breathing, Blood Transfusion Information, MRSA Information and Surgical Site Infection Prevention

## 2013-03-29 ENCOUNTER — Encounter (HOSPITAL_COMMUNITY)
Admission: RE | Admit: 2013-03-29 | Discharge: 2013-03-29 | Disposition: A | Discharge status: Home / Self Care | Payer: Medicare Other | Source: Ambulatory Visit | Attending: Neurological Surgery | Admitting: Neurological Surgery

## 2013-03-29 ENCOUNTER — Encounter (HOSPITAL_COMMUNITY): Payer: Self-pay

## 2013-03-29 DIAGNOSIS — Z01810 Encounter for preprocedural cardiovascular examination: Secondary | ICD-10-CM | POA: Insufficient documentation

## 2013-03-29 DIAGNOSIS — Z01812 Encounter for preprocedural laboratory examination: Secondary | ICD-10-CM | POA: Diagnosis not present

## 2013-03-29 HISTORY — DX: Adverse effect of unspecified anesthetic, initial encounter: T41.45XA

## 2013-03-29 HISTORY — DX: Benign prostatic hyperplasia without lower urinary tract symptoms: N40.0

## 2013-03-29 HISTORY — DX: Bronchitis, not specified as acute or chronic: J40

## 2013-03-29 HISTORY — DX: Insomnia, unspecified: G47.00

## 2013-03-29 LAB — BASIC METABOLIC PANEL
BUN: 8 mg/dL (ref 6–23)
CO2: 24 mEq/L (ref 19–32)
Calcium: 9 mg/dL (ref 8.4–10.5)
Chloride: 105 mEq/L (ref 96–112)
Creatinine, Ser: 0.81 mg/dL (ref 0.50–1.35)
GFR calc Af Amer: >90 mL/min (ref >90)
GFR calc non Af Amer: >90 mL/min (ref >90)
Glucose, Bld: 167 mg/dL — ABNORMAL HIGH (ref 70–99)
Potassium: 4.2 mEq/L (ref 3.7–5.3)
SODIUM: 141 meq/L (ref 137–147)

## 2013-03-29 LAB — TYPE AND SCREEN
ABO/RH(D): A POS
Antibody Screen: NEGATIVE

## 2013-03-29 LAB — ABO/RH: ABO/RH(D): A POS

## 2013-03-29 LAB — SURGICAL PCR SCREEN
MRSA, PCR: NEGATIVE
Staphylococcus aureus: POSITIVE — AB

## 2013-03-29 LAB — CBC WITH DIFFERENTIAL/PLATELET
BASOS ABS: 0.1 10*3/uL (ref 0.0–0.1)
BASOS PCT: 1 % (ref 0–1)
EOS ABS: 0.4 10*3/uL (ref 0.0–0.7)
Eosinophils Relative: 6 % — ABNORMAL HIGH (ref 0–5)
HCT: 40.8 % (ref 39.0–52.0)
Hemoglobin: 13.9 g/dL (ref 13.0–17.0)
LYMPHS ABS: 1.4 10*3/uL (ref 0.7–4.0)
Lymphocytes Relative: 24 % (ref 12–46)
MCH: 29.9 pg (ref 26.0–34.0)
MCHC: 34.1 g/dL (ref 30.0–36.0)
MCV: 87.7 fL (ref 78.0–100.0)
Monocytes Absolute: 0.5 10*3/uL (ref 0.1–1.0)
Monocytes Relative: 8 % (ref 3–12)
NEUTROS PCT: 60 % (ref 43–77)
Neutro Abs: 3.4 10*3/uL (ref 1.7–7.7)
Platelets: 239 10*3/uL (ref 150–400)
RBC: 4.65 MIL/uL (ref 4.22–5.81)
RDW: 13.3 % (ref 11.5–15.5)
WBC: 5.6 10*3/uL (ref 4.0–10.5)

## 2013-03-29 LAB — PROTIME-INR
INR: 1 (ref 0.00–1.49)
Prothrombin Time: 13 seconds (ref 11.6–15.2)

## 2013-03-29 NOTE — Progress Notes (Signed)
Patient informed Nurse that he had a stress test > 5 years ago and that he had a cardiac cath in 2008. PCP is Dr. Anda Kraft and Pulmonologist is Dr. Melvyn Novas. Patient instructed to bring CPAP mask the DOS. Patient verbalized understanding.

## 2013-03-29 NOTE — Progress Notes (Signed)
[name] and patient both notified of positive PCR results.

## 2013-04-01 ENCOUNTER — Encounter (HOSPITAL_COMMUNITY): Payer: Self-pay

## 2013-04-01 DIAGNOSIS — T8859XA Other complications of anesthesia, initial encounter: Secondary | ICD-10-CM

## 2013-04-01 DIAGNOSIS — M76899 Other specified enthesopathies of unspecified lower limb, excluding foot: Secondary | ICD-10-CM | POA: Diagnosis not present

## 2013-04-01 DIAGNOSIS — M545 Low back pain, unspecified: Secondary | ICD-10-CM | POA: Diagnosis not present

## 2013-04-01 HISTORY — DX: Other complications of anesthesia, initial encounter: T88.59XA

## 2013-04-01 NOTE — Progress Notes (Addendum)
Anesthesia Chart Review:  Patient is a 68 year old male scheduled for L5-S1 PLIF on 04/03/13 by Dr. Ronnald Ramp. 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 BMI 33.8.  PCP is Dr. Anda Kraft.  Pulmonologist is Dr. Christinia Gully.  I called him today.  He reports that he has a history of heavy ETOH use, but has been sober for 20 years.  He says that in the past he has required more anesthesia than was expected.  He also needs CPAP in PACU and at night/naps. If regards to his cardiac history, he denies chest pain history.  He denies SOB or significant DOE unless he is suffering for acute allergies.  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  He is able to do day to day activities including grocery shopping if needed.  Cardiac cath on 12/22/06 (done prior to bariatric surgery) showed: LM, LAD, D1/D2 without significant disease.  D3 is a small to medium size vessel, < 2 mm, with 95% stenosis with excellent distal flow. The AV circumflex is a medium-size vessel coursing to the AV addition and giving rise to three obtuse marginal branches. The AV circumflex has a 60% lesion after the takeoff of the second OM. OM1/OM2/OM3 without significant disease. The right coronary is a large vessel which is dominant and gives rise to a PDA as well as a posterolateral branch. There is mild 30% mid RCA lesion. The PDA and posterolateral branches have no significant disease. LVEF 50% with mild anterolateral hypokinesis.  Medical therapy was recommended (Dr. Alla German).  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    CXR on 09/17/12 showed no acute cardiopulmonary abnormality.  Preoperative labs noted.  Cr 0.81, glucose 167.  H/H, PT/INR WNL. T&S done. (He reports his last A1C  was 6.5.)  I reviewed above with anesthesiologist Dr. Marcie Bal, who recommends medical clearance due limited ability to be active with finding of CAD on previous cath in 2008 with underlying risk factors of obesity, DM2, and OSA.  Since he is asymptomatic will defer decision for formal cardiology evaluation to Dr. [name].  Patient is not followed by cardiology. I have notified [name] at Dr. Ronnald Ramp' office.   [name] [phone] 04/01/2013 11:45 AM  Addendum: 04/02/2013 4:45 PM Patient called me today. He was quite indignant.  I tried to explain that medical clearance was requested because he had known coronary stenosis on his cath from 2008 and his underlying DM and limited ability to be active made it more difficult to assess his cardiac status.  He was insistent that nothing was wrong with his heart and said he was being discriminated due to his age.  He even mentioned contacting his attorney for hardship.  He was somewhat apologetic afterwards and felt just really frustrated that he had not heard yet if Dr. [name] medically cleared him.  I did personally speak with Dr. Ronnald Ramp late this afternoon.  He was able to talk with Dr. [name] today who reportedly did not have any strong objections to patient undergoing planned surgery.  Patient has been asymptomatic and has tolerated at least 3-4 surgeries since his 2008 cath, last in 2013.  Based on this input, Dr. Ronnald Ramp plans  to proceed.  He was contacted [name] with this update with understanding that patient will have further evaluation by his assigned anesthesiologist tomorrow.  I have updated anesthesiologist Dr. Albertina Parr.  Hopefully if no changes then he can proceed as planned.

## 2013-04-02 MED ORDER — DEXTROSE 5 % IV SOLN
3.0000 g | INTRAVENOUS | Status: AC
  Administered 2013-04-03: 2 g via INTRAVENOUS
  Filled 2013-04-02: qty 3000

## 2013-04-02 MED ORDER — DEXAMETHASONE SODIUM PHOSPHATE 10 MG/ML IJ SOLN
10.0000 mg | INTRAMUSCULAR | Status: AC
  Administered 2013-04-03: 10 mg via INTRAVENOUS
  Filled 2013-04-02: qty 1

## 2013-04-03 ENCOUNTER — Inpatient Hospital Stay (HOSPITAL_COMMUNITY): Payer: Medicare Other

## 2013-04-03 ENCOUNTER — Encounter (HOSPITAL_COMMUNITY): Payer: Self-pay | Admitting: Anesthesiology

## 2013-04-03 ENCOUNTER — Encounter (HOSPITAL_COMMUNITY)
Admission: RE | Disposition: A | Discharge status: Home / Self Care | Payer: Medicare Other | Source: Ambulatory Visit | Attending: Neurological Surgery

## 2013-04-03 ENCOUNTER — Inpatient Hospital Stay (HOSPITAL_COMMUNITY)
Admission: RE | Admit: 2013-04-03 | Discharge: 2013-04-05 | DRG: 460 | Disposition: A | Discharge status: Home / Self Care | Payer: Medicare Other | Source: Ambulatory Visit | Attending: Neurological Surgery | Admitting: Neurological Surgery

## 2013-04-03 ENCOUNTER — Inpatient Hospital Stay (HOSPITAL_COMMUNITY): Payer: Medicare Other | Admitting: Anesthesiology

## 2013-04-03 ENCOUNTER — Encounter (HOSPITAL_COMMUNITY): Payer: Medicare Other | Admitting: Vascular Surgery

## 2013-04-03 DIAGNOSIS — M4310 Spondylolisthesis, site unspecified: Secondary | ICD-10-CM | POA: Diagnosis not present

## 2013-04-03 DIAGNOSIS — Z96649 Presence of unspecified artificial hip joint: Secondary | ICD-10-CM | POA: Diagnosis not present

## 2013-04-03 DIAGNOSIS — E119 Type 2 diabetes mellitus without complications: Secondary | ICD-10-CM | POA: Diagnosis not present

## 2013-04-03 DIAGNOSIS — M48061 Spinal stenosis, lumbar region without neurogenic claudication: Secondary | ICD-10-CM | POA: Diagnosis not present

## 2013-04-03 DIAGNOSIS — N400 Enlarged prostate without lower urinary tract symptoms: Secondary | ICD-10-CM | POA: Diagnosis not present

## 2013-04-03 DIAGNOSIS — K219 Gastro-esophageal reflux disease without esophagitis: Secondary | ICD-10-CM | POA: Diagnosis present

## 2013-04-03 DIAGNOSIS — Z885 Allergy status to narcotic agent status: Secondary | ICD-10-CM

## 2013-04-03 DIAGNOSIS — Z9884 Bariatric surgery status: Secondary | ICD-10-CM

## 2013-04-03 DIAGNOSIS — Z981 Arthrodesis status: Secondary | ICD-10-CM

## 2013-04-03 DIAGNOSIS — Z87891 Personal history of nicotine dependence: Secondary | ICD-10-CM

## 2013-04-03 DIAGNOSIS — J45909 Unspecified asthma, uncomplicated: Secondary | ICD-10-CM | POA: Diagnosis present

## 2013-04-03 DIAGNOSIS — M545 Low back pain, unspecified: Secondary | ICD-10-CM | POA: Diagnosis not present

## 2013-04-03 DIAGNOSIS — G4730 Sleep apnea, unspecified: Secondary | ICD-10-CM

## 2013-04-03 DIAGNOSIS — F988 Other specified behavioral and emotional disorders with onset usually occurring in childhood and adolescence: Secondary | ICD-10-CM | POA: Diagnosis present

## 2013-04-03 DIAGNOSIS — F329 Major depressive disorder, single episode, unspecified: Secondary | ICD-10-CM | POA: Diagnosis present

## 2013-04-03 DIAGNOSIS — Z79899 Other long term (current) drug therapy: Secondary | ICD-10-CM | POA: Diagnosis not present

## 2013-04-03 DIAGNOSIS — G4700 Insomnia, unspecified: Secondary | ICD-10-CM | POA: Diagnosis present

## 2013-04-03 DIAGNOSIS — Z96659 Presence of unspecified artificial knee joint: Secondary | ICD-10-CM

## 2013-04-03 DIAGNOSIS — F3289 Other specified depressive episodes: Secondary | ICD-10-CM | POA: Diagnosis present

## 2013-04-03 DIAGNOSIS — Q762 Congenital spondylolisthesis: Secondary | ICD-10-CM | POA: Diagnosis not present

## 2013-04-03 DIAGNOSIS — M47817 Spondylosis without myelopathy or radiculopathy, lumbosacral region: Secondary | ICD-10-CM | POA: Diagnosis not present

## 2013-04-03 DIAGNOSIS — Z8249 Family history of ischemic heart disease and other diseases of the circulatory system: Secondary | ICD-10-CM | POA: Diagnosis not present

## 2013-04-03 HISTORY — PX: LUMBAR FUSION: SHX111

## 2013-04-03 LAB — GLUCOSE, CAPILLARY
Glucose-Capillary: 181 mg/dL — ABNORMAL HIGH (ref 70–99)
Glucose-Capillary: 277 mg/dL — ABNORMAL HIGH (ref 70–99)

## 2013-04-03 SURGERY — POSTERIOR LUMBAR FUSION 1 LEVEL
Anesthesia: General | Site: Back

## 2013-04-03 MED ORDER — SODIUM CHLORIDE 0.9 % IJ SOLN: 3.0000 mL | INTRAMUSCULAR | Status: DC | PRN

## 2013-04-03 MED ORDER — HYDROMORPHONE HCL PF 1 MG/ML IJ SOLN
INTRAMUSCULAR | Status: AC
  Filled 2013-04-03: qty 1

## 2013-04-03 MED ORDER — METHOCARBAMOL 500 MG PO TABS
500.0000 mg | ORAL_TABLET | Freq: Four times a day (QID) | ORAL | Status: DC | PRN
  Administered 2013-04-03 – 2013-04-04 (×3): 500 mg via ORAL
  Filled 2013-04-03 (×3): qty 1

## 2013-04-03 MED ORDER — METFORMIN HCL 500 MG PO TABS
1000.0000 mg | ORAL_TABLET | Freq: Two times a day (BID) | ORAL | Status: DC
  Administered 2013-04-04 – 2013-04-05 (×3): 1000 mg via ORAL
  Filled 2013-04-03 (×5): qty 2

## 2013-04-03 MED ORDER — OXYCODONE HCL 5 MG/5ML PO SOLN: 5.0000 mg | Freq: Once | ORAL | Status: DC | PRN

## 2013-04-03 MED ORDER — KETOROLAC TROMETHAMINE 30 MG/ML IJ SOLN
INTRAMUSCULAR | Status: DC | PRN
  Administered 2013-04-03: 30 mg via INTRAVENOUS

## 2013-04-03 MED ORDER — HYDROMORPHONE 0.3 MG/ML IV SOLN
INTRAVENOUS | Status: DC
  Administered 2013-04-03: 0.2 mg via INTRAVENOUS

## 2013-04-03 MED ORDER — MIDAZOLAM HCL 2 MG/2ML IJ SOLN: 0.5000 mg | Freq: Once | INTRAMUSCULAR | Status: DC | PRN

## 2013-04-03 MED ORDER — ROCURONIUM BROMIDE 100 MG/10ML IV SOLN
INTRAVENOUS | Status: DC | PRN
  Administered 2013-04-03 (×2): 20 mg via INTRAVENOUS
  Administered 2013-04-03: 10 mg via INTRAVENOUS
  Administered 2013-04-03: 50 mg via INTRAVENOUS

## 2013-04-03 MED ORDER — HYDROMORPHONE 0.3 MG/ML IV SOLN
INTRAVENOUS | Status: AC
  Filled 2013-04-03: qty 25

## 2013-04-03 MED ORDER — PANTOPRAZOLE SODIUM 40 MG PO TBEC
40.0000 mg | DELAYED_RELEASE_TABLET | Freq: Every day | ORAL | Status: DC
  Administered 2013-04-04 – 2013-04-05 (×2): 40 mg via ORAL
  Filled 2013-04-03: qty 1

## 2013-04-03 MED ORDER — SODIUM CHLORIDE 0.9 % IR SOLN
Status: DC | PRN
  Administered 2013-04-03: 14:00:00

## 2013-04-03 MED ORDER — DIPHENHYDRAMINE HCL 12.5 MG/5ML PO ELIX
12.5000 mg | ORAL_SOLUTION | Freq: Four times a day (QID) | ORAL | Status: DC | PRN
  Filled 2013-04-03: qty 5

## 2013-04-03 MED ORDER — ALBUTEROL SULFATE HFA 108 (90 BASE) MCG/ACT IN AERS: 2.0000 | INHALATION_SPRAY | Freq: Four times a day (QID) | RESPIRATORY_TRACT | Status: DC | PRN

## 2013-04-03 MED ORDER — OXYCODONE HCL 5 MG PO TABS: 5.0000 mg | ORAL_TABLET | Freq: Once | ORAL | Status: DC | PRN

## 2013-04-03 MED ORDER — KETOROLAC TROMETHAMINE 30 MG/ML IJ SOLN
INTRAMUSCULAR | Status: AC
  Filled 2013-04-03: qty 1

## 2013-04-03 MED ORDER — CEFAZOLIN SODIUM 1-5 GM-% IV SOLN
1.0000 g | Freq: Three times a day (TID) | INTRAVENOUS | Status: AC
  Administered 2013-04-03 – 2013-04-04 (×2): 1 g via INTRAVENOUS
  Filled 2013-04-03 (×2): qty 50

## 2013-04-03 MED ORDER — CELECOXIB 200 MG PO CAPS
200.0000 mg | ORAL_CAPSULE | Freq: Two times a day (BID) | ORAL | Status: DC
  Administered 2013-04-03 – 2013-04-05 (×4): 200 mg via ORAL
  Filled 2013-04-03 (×5): qty 1

## 2013-04-03 MED ORDER — MENTHOL 3 MG MT LOZG: 1.0000 | LOZENGE | OROMUCOSAL | Status: DC | PRN

## 2013-04-03 MED ORDER — ZOLPIDEM TARTRATE 5 MG PO TABS
5.0000 mg | ORAL_TABLET | Freq: Every evening | ORAL | Status: DC | PRN
  Administered 2013-04-03: 5 mg via ORAL
  Filled 2013-04-03: qty 1

## 2013-04-03 MED ORDER — 0.9 % SODIUM CHLORIDE (POUR BTL) OPTIME
TOPICAL | Status: DC | PRN
  Administered 2013-04-03: 1000 mL

## 2013-04-03 MED ORDER — GLYCOPYRROLATE 0.2 MG/ML IJ SOLN
INTRAMUSCULAR | Status: AC
  Filled 2013-04-03: qty 1

## 2013-04-03 MED ORDER — NEOSTIGMINE METHYLSULFATE 1 MG/ML IJ SOLN
INTRAMUSCULAR | Status: AC
  Filled 2013-04-03: qty 10

## 2013-04-03 MED ORDER — ARTIFICIAL TEARS OP OINT
TOPICAL_OINTMENT | OPHTHALMIC | Status: DC | PRN
  Administered 2013-04-03: 1 via OPHTHALMIC

## 2013-04-03 MED ORDER — ROCURONIUM BROMIDE 50 MG/5ML IV SOLN
INTRAVENOUS | Status: AC
  Filled 2013-04-03: qty 1

## 2013-04-03 MED ORDER — TAMSULOSIN HCL 0.4 MG PO CAPS
0.4000 mg | ORAL_CAPSULE | Freq: Every day | ORAL | Status: DC
  Administered 2013-04-04 – 2013-04-05 (×2): 0.4 mg via ORAL
  Filled 2013-04-03 (×3): qty 1

## 2013-04-03 MED ORDER — THROMBIN 5000 UNITS EX SOLR
OROMUCOSAL | Status: DC | PRN
  Administered 2013-04-03: 14:00:00 via TOPICAL

## 2013-04-03 MED ORDER — SODIUM CHLORIDE 0.9 % IJ SOLN: 9.0000 mL | INTRAMUSCULAR | Status: DC | PRN

## 2013-04-03 MED ORDER — PROMETHAZINE HCL 25 MG/ML IJ SOLN: 6.2500 mg | INTRAMUSCULAR | Status: DC | PRN

## 2013-04-03 MED ORDER — ALPRAZOLAM 0.5 MG PO TABS
0.5000 mg | ORAL_TABLET | Freq: Three times a day (TID) | ORAL | Status: DC | PRN
  Administered 2013-04-04: 0.5 mg via ORAL
  Filled 2013-04-03: qty 1

## 2013-04-03 MED ORDER — LITHIUM CARBONATE ER 450 MG PO TBCR
1350.0000 mg | EXTENDED_RELEASE_TABLET | Freq: Every day | ORAL | Status: DC
  Administered 2013-04-03 – 2013-04-04 (×2): 1350 mg via ORAL
  Filled 2013-04-03 (×3): qty 3

## 2013-04-03 MED ORDER — SODIUM CHLORIDE 0.9 % IJ SOLN
3.0000 mL | Freq: Two times a day (BID) | INTRAMUSCULAR | Status: DC
  Administered 2013-04-03 – 2013-04-04 (×3): 3 mL via INTRAVENOUS

## 2013-04-03 MED ORDER — ONDANSETRON HCL 4 MG/2ML IJ SOLN: 4.0000 mg | Freq: Four times a day (QID) | INTRAMUSCULAR | Status: DC | PRN

## 2013-04-03 MED ORDER — MIDAZOLAM HCL 5 MG/5ML IJ SOLN
INTRAMUSCULAR | Status: DC | PRN
  Administered 2013-04-03: 2 mg via INTRAVENOUS

## 2013-04-03 MED ORDER — SODIUM CHLORIDE 0.9 % IJ SOLN
INTRAMUSCULAR | Status: AC
  Filled 2013-04-03: qty 10

## 2013-04-03 MED ORDER — MIDAZOLAM HCL 2 MG/2ML IJ SOLN
INTRAMUSCULAR | Status: AC
  Filled 2013-04-03: qty 2

## 2013-04-03 MED ORDER — PROPOFOL 10 MG/ML IV BOLUS
INTRAVENOUS | Status: DC | PRN
  Administered 2013-04-03: 120 mg via INTRAVENOUS

## 2013-04-03 MED ORDER — NEOSTIGMINE METHYLSULFATE 1 MG/ML IJ SOLN
INTRAMUSCULAR | Status: DC | PRN
  Administered 2013-04-03: 5 mg via INTRAVENOUS

## 2013-04-03 MED ORDER — BUDESONIDE-FORMOTEROL FUMARATE 80-4.5 MCG/ACT IN AERO
2.0000 | INHALATION_SPRAY | Freq: Two times a day (BID) | RESPIRATORY_TRACT | Status: DC
Start: 2013-04-03 — End: 2013-04-05
  Administered 2013-04-03 – 2013-04-05 (×4): 2 via RESPIRATORY_TRACT
  Filled 2013-04-03: qty 6.9

## 2013-04-03 MED ORDER — ARTIFICIAL TEARS OP OINT
TOPICAL_OINTMENT | OPHTHALMIC | Status: AC
  Filled 2013-04-03: qty 3.5

## 2013-04-03 MED ORDER — HYDROMORPHONE HCL PF 1 MG/ML IJ SOLN
0.2500 mg | INTRAMUSCULAR | Status: DC | PRN
  Administered 2013-04-03 (×2): 0.5 mg via INTRAVENOUS

## 2013-04-03 MED ORDER — PHENOL 1.4 % MT LIQD: 1.0000 | OROMUCOSAL | Status: DC | PRN

## 2013-04-03 MED ORDER — METHYLPHENIDATE HCL ER (OSM) 36 MG PO TBCR
54.0000 mg | EXTENDED_RELEASE_TABLET | Freq: Every day | ORAL | Status: DC
  Filled 2013-04-03: qty 1

## 2013-04-03 MED ORDER — POTASSIUM CHLORIDE IN NACL 20-0.9 MEQ/L-% IV SOLN
INTRAVENOUS | Status: DC
  Filled 2013-04-03 (×4): qty 1000

## 2013-04-03 MED ORDER — SENNA 8.6 MG PO TABS
1.0000 | ORAL_TABLET | Freq: Two times a day (BID) | ORAL | Status: DC
  Administered 2013-04-04: 8.6 mg via ORAL
  Filled 2013-04-03 (×4): qty 1

## 2013-04-03 MED ORDER — PHENYLEPHRINE HCL 10 MG/ML IJ SOLN
INTRAMUSCULAR | Status: DC | PRN
  Administered 2013-04-03 (×2): 80 ug via INTRAVENOUS

## 2013-04-03 MED ORDER — SUCCINYLCHOLINE CHLORIDE 20 MG/ML IJ SOLN
INTRAMUSCULAR | Status: AC
  Filled 2013-04-03: qty 1

## 2013-04-03 MED ORDER — SUFENTANIL CITRATE 50 MCG/ML IV SOLN
INTRAVENOUS | Status: AC
  Filled 2013-04-03: qty 1

## 2013-04-03 MED ORDER — LACTATED RINGERS IV SOLN
INTRAVENOUS | Status: DC
  Administered 2013-04-03: 12:00:00 via INTRAVENOUS

## 2013-04-03 MED ORDER — THROMBIN 20000 UNITS EX SOLR
CUTANEOUS | Status: DC | PRN
  Administered 2013-04-03: 14:00:00 via TOPICAL

## 2013-04-03 MED ORDER — GLYCOPYRROLATE 0.2 MG/ML IJ SOLN
INTRAMUSCULAR | Status: DC | PRN
  Administered 2013-04-03: 0.2 mg via INTRAVENOUS
  Administered 2013-04-03: 0.6 mg via INTRAVENOUS

## 2013-04-03 MED ORDER — PROPOFOL 10 MG/ML IV BOLUS
INTRAVENOUS | Status: AC
  Filled 2013-04-03: qty 20

## 2013-04-03 MED ORDER — DUTASTERIDE 0.5 MG PO CAPS
0.5000 mg | ORAL_CAPSULE | Freq: Every day | ORAL | Status: DC
  Administered 2013-04-04 – 2013-04-05 (×2): 0.5 mg via ORAL
  Filled 2013-04-03 (×3): qty 1

## 2013-04-03 MED ORDER — LACTATED RINGERS IV SOLN
INTRAVENOUS | Status: DC | PRN
  Administered 2013-04-03 (×3): via INTRAVENOUS

## 2013-04-03 MED ORDER — OXYCODONE-ACETAMINOPHEN 5-325 MG PO TABS
1.0000 | ORAL_TABLET | ORAL | Status: DC | PRN
  Administered 2013-04-03 – 2013-04-05 (×7): 2 via ORAL
  Administered 2013-04-05: 1 via ORAL
  Filled 2013-04-03 (×5): qty 2
  Filled 2013-04-03: qty 1
  Filled 2013-04-03 (×2): qty 2

## 2013-04-03 MED ORDER — DIPHENHYDRAMINE HCL 50 MG/ML IJ SOLN
12.5000 mg | Freq: Four times a day (QID) | INTRAMUSCULAR | Status: DC | PRN
  Filled 2013-04-03: qty 0.25

## 2013-04-03 MED ORDER — NALOXONE HCL 0.4 MG/ML IJ SOLN
0.4000 mg | INTRAMUSCULAR | Status: DC | PRN
  Filled 2013-04-03: qty 1

## 2013-04-03 MED ORDER — LIDOCAINE HCL (CARDIAC) 20 MG/ML IV SOLN
INTRAVENOUS | Status: AC
  Filled 2013-04-03: qty 5

## 2013-04-03 MED ORDER — EPHEDRINE SULFATE 50 MG/ML IJ SOLN
INTRAMUSCULAR | Status: AC
  Filled 2013-04-03: qty 1

## 2013-04-03 MED ORDER — METHOCARBAMOL 100 MG/ML IJ SOLN
500.0000 mg | Freq: Four times a day (QID) | INTRAVENOUS | Status: DC | PRN
  Filled 2013-04-03: qty 5

## 2013-04-03 MED ORDER — ROCURONIUM BROMIDE 50 MG/5ML IV SOLN
INTRAVENOUS | Status: AC
Start: 2013-04-03 — End: 2013-04-03
  Filled 2013-04-03: qty 1

## 2013-04-03 MED ORDER — ONDANSETRON HCL 4 MG/2ML IJ SOLN: 4.0000 mg | INTRAMUSCULAR | Status: DC | PRN

## 2013-04-03 MED ORDER — SUFENTANIL CITRATE 50 MCG/ML IV SOLN
INTRAVENOUS | Status: DC | PRN
  Administered 2013-04-03: 20 ug via INTRAVENOUS
  Administered 2013-04-03 (×2): 10 ug via INTRAVENOUS
  Administered 2013-04-03: 20 ug via INTRAVENOUS

## 2013-04-03 MED ORDER — MEPERIDINE HCL 25 MG/ML IJ SOLN: 6.2500 mg | INTRAMUSCULAR | Status: DC | PRN

## 2013-04-03 MED ORDER — ALBUTEROL SULFATE (2.5 MG/3ML) 0.083% IN NEBU: 2.5000 mg | INHALATION_SOLUTION | Freq: Four times a day (QID) | RESPIRATORY_TRACT | Status: DC | PRN

## 2013-04-03 MED ORDER — ACETAMINOPHEN 325 MG PO TABS: 650.0000 mg | ORAL_TABLET | ORAL | Status: DC | PRN

## 2013-04-03 MED ORDER — ACETAMINOPHEN 650 MG RE SUPP: 650.0000 mg | RECTAL | Status: DC | PRN

## 2013-04-03 MED ORDER — DUTASTERIDE-TAMSULOSIN HCL 0.5-0.4 MG PO CAPS: 1.0000 | ORAL_CAPSULE | Freq: Every day | ORAL | Status: DC

## 2013-04-03 MED ORDER — LIDOCAINE HCL (CARDIAC) 20 MG/ML IV SOLN
INTRAVENOUS | Status: DC | PRN
  Administered 2013-04-03: 50 mg via INTRAVENOUS

## 2013-04-03 MED ORDER — DEXTROSE 5 % IV SOLN
10.0000 mg | INTRAVENOUS | Status: DC | PRN
  Administered 2013-04-03: 10 ug/min via INTRAVENOUS

## 2013-04-03 SURGICAL SUPPLY — 69 items
APL SKNCLS STERI-STRIP NONHPOA (GAUZE/BANDAGES/DRESSINGS) ×1
BAG DECANTER FOR FLEXI CONT (MISCELLANEOUS) ×2 IMPLANT
BENZOIN TINCTURE PRP APPL 2/3 (GAUZE/BANDAGES/DRESSINGS) ×2 IMPLANT
BLADE SURG ROTATE 9660 (MISCELLANEOUS) IMPLANT
BONE MATRIX OSTEOCEL PRO MED (Bone Implant) ×2 IMPLANT
BUR MATCHSTICK NEURO 3.0 LAGG (BURR) ×2 IMPLANT
CANISTER SUCT 3000ML (MISCELLANEOUS) ×2 IMPLANT
CONT SPEC 4OZ CLIKSEAL STRL BL (MISCELLANEOUS) ×4 IMPLANT
COVER BACK TABLE 24X17X13 BIG (DRAPES) IMPLANT
COVER TABLE BACK 60X90 (DRAPES) ×2 IMPLANT
DRAPE C-ARM 42X72 X-RAY (DRAPES) ×4 IMPLANT
DRAPE LAPAROTOMY 100X72X124 (DRAPES) ×2 IMPLANT
DRAPE POUCH INSTRU U-SHP 10X18 (DRAPES) ×2 IMPLANT
DRAPE SURG 17X23 STRL (DRAPES) ×2 IMPLANT
DRESSING TELFA 8X3 (GAUZE/BANDAGES/DRESSINGS) ×1 IMPLANT
DRSG OPSITE 4X5.5 SM (GAUZE/BANDAGES/DRESSINGS) ×3 IMPLANT
DRSG OPSITE POSTOP 4X6 (GAUZE/BANDAGES/DRESSINGS) ×1 IMPLANT
DURAPREP 26ML APPLICATOR (WOUND CARE) ×2 IMPLANT
ELECT REM PT RETURN 9FT ADLT (ELECTROSURGICAL) ×2
ELECTRODE REM PT RTRN 9FT ADLT (ELECTROSURGICAL) ×1 IMPLANT
EVACUATOR 1/8 PVC DRAIN (DRAIN) ×2 IMPLANT
GAUZE SPONGE 4X4 16PLY XRAY LF (GAUZE/BANDAGES/DRESSINGS) IMPLANT
GLOVE BIO SURGEON STRL SZ 6.5 (GLOVE) ×2 IMPLANT
GLOVE BIO SURGEON STRL SZ8 (GLOVE) ×4 IMPLANT
GLOVE BIOGEL PI IND STRL 6.5 (GLOVE) IMPLANT
GLOVE BIOGEL PI IND STRL 7.0 (GLOVE) IMPLANT
GLOVE BIOGEL PI INDICATOR 6.5 (GLOVE) ×1
GLOVE BIOGEL PI INDICATOR 7.0 (GLOVE) ×1
GLOVE ECLIPSE 7.5 STRL STRAW (GLOVE) ×1 IMPLANT
GLOVE SURG SS PI 7.0 STRL IVOR (GLOVE) ×6 IMPLANT
GOWN BRE IMP SLV AUR LG STRL (GOWN DISPOSABLE) IMPLANT
GOWN BRE IMP SLV AUR XL STRL (GOWN DISPOSABLE) ×2 IMPLANT
GOWN STRL REIN 2XL LVL4 (GOWN DISPOSABLE) IMPLANT
GOWN STRL REUS W/ TWL LRG LVL3 (GOWN DISPOSABLE) IMPLANT
GOWN STRL REUS W/ TWL XL LVL3 (GOWN DISPOSABLE) IMPLANT
GOWN STRL REUS W/TWL LRG LVL3 (GOWN DISPOSABLE) ×4
GOWN STRL REUS W/TWL XL LVL3 (GOWN DISPOSABLE) ×6
GRAFT BN 5X1XSPNE CVD POST DBM (Bone Implant) IMPLANT
GRAFT BONE MAGNIFUSE 1X5CM (Bone Implant) ×2 IMPLANT
HEMOSTAT POWDER KIT SURGIFOAM (HEMOSTASIS) ×2 IMPLANT
KIT BASIN OR (CUSTOM PROCEDURE TRAY) ×2 IMPLANT
KIT ROOM TURNOVER OR (KITS) ×2 IMPLANT
MILL MEDIUM DISP (BLADE) ×1 IMPLANT
NDL HYPO 25X1 1.5 SAFETY (NEEDLE) ×1 IMPLANT
NEEDLE HYPO 25X1 1.5 SAFETY (NEEDLE) ×2 IMPLANT
NS IRRIG 1000ML POUR BTL (IV SOLUTION) ×2 IMPLANT
PACK LAMINECTOMY NEURO (CUSTOM PROCEDURE TRAY) ×2 IMPLANT
PAD ARMBOARD 7.5X6 YLW CONV (MISCELLANEOUS) ×7 IMPLANT
ROD REBENT 35MM (Rod) ×2 IMPLANT
SCREW 6.5X40MM (Screw) ×4 IMPLANT
SCREW BN 40X6.5XPA NS SPNE (Screw) IMPLANT
SCREW LOCK (Screw) ×8 IMPLANT
SCREW LOCK 100X5.5X OPN (Screw) IMPLANT
SCREW POLY 45X6.5 (Screw) IMPLANT
SCREW POLY 6.5X45MM (Screw) ×4 IMPLANT
SPONGE LAP 4X18 X RAY DECT (DISPOSABLE) IMPLANT
SPONGE SURGIFOAM ABS GEL 100 (HEMOSTASIS) ×2 IMPLANT
STRIP CLOSURE SKIN 1/2X4 (GAUZE/BANDAGES/DRESSINGS) ×3 IMPLANT
SUT VIC AB 0 CT1 18XCR BRD8 (SUTURE) ×1 IMPLANT
SUT VIC AB 0 CT1 8-18 (SUTURE) ×4
SUT VIC AB 2-0 CP2 18 (SUTURE) ×3 IMPLANT
SUT VIC AB 3-0 SH 8-18 (SUTURE) ×4 IMPLANT
SYR 20ML ECCENTRIC (SYRINGE) ×2 IMPLANT
TOWEL OR 17X24 6PK STRL BLUE (TOWEL DISPOSABLE) ×2 IMPLANT
TOWEL OR 17X26 10 PK STRL BLUE (TOWEL DISPOSABLE) ×2 IMPLANT
TRAP SPECIMEN MUCOUS 40CC (MISCELLANEOUS) ×1 IMPLANT
TRAY FOLEY CATH 14FRSI W/METER (CATHETERS) ×1 IMPLANT
TRAY FOLEY CATH 16FRSI W/METER (SET/KITS/TRAYS/PACK) ×1 IMPLANT
WATER STERILE IRR 1000ML POUR (IV SOLUTION) ×2 IMPLANT

## 2013-04-03 NOTE — H&P (Signed)
Subjective: Patient is a 68 y.o. male admitted for PLIF L5-S1. Onset of symptoms was several months ago, gradually worsening since that time.  The pain is rated severe, and is located at the across the lower back and radiates to legs. The pain is described as aching and occurs intermittently. The symptoms have been progressive. Symptoms are exacerbated by exercise. MRI or CT showed lytic spondylolisthesis L5-S1.   Past Medical History  Diagnosis Date  . Sleep apnea     settings 10.6  / followed by [name] study years ago  . Diabetes mellitus     PCP [name]  . Recurrent upper respiratory infection (URI)     states Dr Wynelle Link aware- no fever- states is improving  . Arthritis   . Depression     ADD  . Tremor     d/t lithium  . Asthma   . Bronchitis     hx of  . Insomnia   . BPH (benign prostatic hyperplasia)   . Complication of anesthesia 06/21/79    no complications but just says he typically needs "more than expected" to anesthetize; needs CPAP (setting 10) in recovery    Past Surgical History  Procedure Laterality Date  . Cardiac catheterization      2008 pre op gastric bypass  . Joint replacement      Left, Right Knee, Right hip  . Knee arthroscopy      right x 3-4, left x 2  . Knee arthrotomy      right  . Gastric bypass  2008  . Polypectomy      vocal cords 1992  . Total hip revision  04/22/2011    Procedure: TOTAL HIP REVISION;  Surgeon: Gearlean Alf, MD;  Location: [facility];  Service: Orthopedics;  Laterality: Right;  . Colonoscopy w/ polypectomy      Prior to Admission medications   Medication Sig Start Date End Date Taking? Authorizing Provider  albuterol (PROVENTIL HFA;VENTOLIN HFA) 108 (90 BASE) MCG/ACT inhaler Inhale 2 puffs into the lungs every 6 (six) hours as needed for wheezing or shortness of breath.   Yes Historical Provider, MD  ALPRAZolam Duanne Moron) 1 MG tablet Take 0.5 mg by mouth 3 (three) times daily as needed for sleep.   Yes Historical  Provider, MD  budesonide-formoterol (SYMBICORT) 80-4.5 MCG/ACT inhaler Inhale 2 puffs into the lungs 2 (two) times daily. 09/27/12  Yes Tanda Rockers, MD  Dutasteride-Tamsulosin HCl 0.5-0.4 MG CAPS Take 1 capsule by mouth daily before breakfast.   Yes Historical Provider, MD  HYDROcodone-acetaminophen (NORCO) 10-325 MG per tablet Take 1 tablet by mouth every 6 (six) hours as needed for moderate pain.   Yes Historical Provider, MD  lithium carbonate (ESKALITH) 450 MG CR tablet Take 1,350 mg by mouth at bedtime.    Yes Historical Provider, MD  metFORMIN (GLUCOPHAGE) 500 MG tablet Take 1,000 mg by mouth 2 (two) times daily with a meal.    Yes Historical Provider, MD  methocarbamol (ROBAXIN) 500 MG tablet Take 500 mg by mouth every 8 (eight) hours as needed for muscle spasms.    Yes Historical Provider, MD  methylphenidate (CONCERTA) 54 MG CR tablet Take 54 mg by mouth daily before breakfast.   Yes Historical Provider, MD  omeprazole (PRILOSEC) 20 MG capsule Take 20 mg by mouth daily before breakfast.   Yes Historical Provider, MD  traMADol (ULTRAM) 50 MG tablet Take 50 mg by mouth every 6 (six) hours as needed for moderate pain.  Yes Historical Provider, MD  zolpidem (AMBIEN) 10 MG tablet Take 10 mg by mouth at bedtime as needed for sleep.   Yes Historical Provider, MD   Allergies  Allergen Reactions  . Demerol [Meperidine] Itching  . Lamotrigine     welts    History  Substance Use Topics  . Smoking status: Former Smoker -- 1.50 packs/day for 20 years    Types: Cigarettes    Quit date: 04/18/1991  . Smokeless tobacco: Never Used  . Alcohol Use: No    Family History  Problem Relation Age of Onset  . Asthma Maternal Grandmother   . Heart disease Father   . Anxiety disorder Mother      Review of Systems  Positive ROS: neg  All other systems have been reviewed and were otherwise negative with the exception of those mentioned in the HPI and as above.  Objective: Vital signs in last  24 hours: Temp:  [98.6 F (37 C)] 98.6 F (37 C) (01/21 1158) Pulse Rate:  [75] 75 (01/21 1158) Resp:  [20] 20 (01/21 1158) BP: (145)/(74) 145/74 mmHg (01/21 1158) SpO2:  [97 %] 97 % (01/21 1158)  General Appearance: Alert, cooperative, no distress, appears stated age Head: Normocephalic, without obvious abnormality, atraumatic Eyes: PERRL, conjunctiva/corneas clear, EOM's intact    Neck: Supple, symmetrical, trachea midline Back: Symmetric, no curvature, ROM normal, no CVA tenderness Lungs:  respirations unlabored Heart: Regular rate and rhythm Abdomen: Soft, non-tender Extremities: Extremities normal, atraumatic, no cyanosis or edema Pulses: 2+ and symmetric all extremities Skin: Skin color, texture, turgor normal, no rashes or lesions  NEUROLOGIC:   Mental status: Alert and oriented x4,  no aphasia, good attention span, fund of knowledge, and memory Motor Exam - grossly normal Sensory Exam - grossly normal Reflexes: 1+ Coordination - grossly normal Gait - ANTALGIC Balance - DECREASED Cranial Nerves: I: smell Not tested  II: visual acuity  OS: nl    OD: nl  II: visual fields Full to confrontation  II: pupils Equal, round, reactive to light  III,VII: ptosis None  III,IV,VI: extraocular muscles  Full ROM  V: mastication Normal  V: facial light touch sensation  Normal  V,VII: corneal reflex  Present  VII: facial muscle function - upper  Normal  VII: facial muscle function - lower Normal  VIII: hearing Not tested  IX: soft palate elevation  Normal  IX,X: gag reflex Present  XI: trapezius strength  5/5  XI: sternocleidomastoid strength 5/5  XI: neck flexion strength  5/5  XII: tongue strength  Normal    Data Review Lab Results  Component Value Date   WBC 5.6 03/29/2013   HGB 13.9 03/29/2013   HCT 40.8 03/29/2013   MCV 87.7 03/29/2013   PLT 239 03/29/2013   Lab Results  Component Value Date   NA 141 03/29/2013   K 4.2 03/29/2013   CL 105 03/29/2013   CO2 24  03/29/2013   BUN 8 03/29/2013   CREATININE 0.81 03/29/2013   GLUCOSE 167* 03/29/2013   Lab Results  Component Value Date   INR 1.00 03/29/2013    Assessment/Plan: Patient admitted for PLIF L5-S1. Patient has failed a reasonable attempt at conservative therapy.  I explained the condition and procedure to the patient and answered any questions.  Patient wishes to proceed with procedure as planned. Understands risks/ benefits and typical outcomes of procedure.   Sherrie Marsan S 04/03/2013 1:24 PM

## 2013-04-03 NOTE — Anesthesia Postprocedure Evaluation (Signed)
  Anesthesia Post-op Note  Patient: [name].  Procedure(s) Performed: Procedure(s) with comments: Lumbar five-Sacral one Posterior Lumbar Interbody Fusion (N/A) - Lumbar five-Sacral one Posterior Lumbar Interbody Fusion  Patient Location: PACU  Anesthesia Type:General  Level of Consciousness: awake, alert , oriented and patient cooperative  Airway and Oxygen Therapy: Patient Spontanous Breathing and Patient connected to nasal cannula oxygen  Post-op Pain: mild  Post-op Assessment: Post-op Vital signs reviewed, Patient's Cardiovascular Status Stable, Respiratory Function Stable, Patent Airway, No signs of Nausea or vomiting and Pain level controlled  Post-op Vital Signs: Reviewed and stable  Complications: No apparent anesthesia complications

## 2013-04-03 NOTE — Op Note (Signed)
04/03/2013  5:52 PM  PATIENT:  [name].  68 y.o. male  PRE-OPERATIVE DIAGNOSIS:  Spondylolisthesis L5-S1 with back and leg pain  POST-OPERATIVE DIAGNOSIS:  Same  PROCEDURE:   1. Decompressive lumbar laminectomy in a Gill-type fashion L5-S1 in order to adequately decompress the neural elements and address the spinal stenosis 2. Posterior fixation L5-S1 using Nuvasive pedicle screws.  3. Intertransverse arthrodesis L5-S1 using morcellized autograft and allograft.  SURGEON:  Sherley Bounds, MD  ASSISTANTS: Dr. Luiz Ochoa  ANESTHESIA:  General  EBL: 400 ml  Total I/O In: 2850 [I.V.:2700; Blood:150] Out: 900 [Urine:500; Blood:400]  BLOOD ADMINISTERED:none  DRAINS: Hemovac   INDICATION FOR PROCEDURE: This patient presented with severe back and leg pain. MRI showed a lytic spondylolisthesis at L5-S1 with foraminal stenosis. Recommended posterior lumbar interbody fusion with non-single fixation when he felt medical management quite some time. Patient understood the risks, benefits, and alternatives and potential outcomes and wished to proceed.  PROCEDURE DETAILS:  The patient was brought to the operating room. After induction of generalized endotracheal anesthesia the patient was rolled into the prone position on chest rolls and all pressure points were padded. The patient's lumbar region was cleaned and then prepped with DuraPrep and draped in the usual sterile fashion. Anesthesia was injected and then a dorsal midline incision was made and carried down to the lumbosacral fascia. The fascia was opened and the paraspinous musculature was taken down in a subperiosteal fashion to expose L5-S1. A self-retaining retractor was placed. Intraoperative fluoroscopy confirmed my level, and I started with the decompression. I removed the spinous process and complete lumbar laminectomies, hemi- facetectomies, and foraminotomies were performed at L5-S1 and a Gill-type fashion. The patient had  significant spinal stenosis and this required more work than would be required for a simple exposure of the disc for posterior lumbar interbody fusion. Much more generous decompression was undertaken in order to adequately decompress the neural elements and address the patient's leg pain. The yellow ligament was removed to expose the underlying dura and nerve roots, and generous foraminotomies were performed to adequately decompress the neural elements. Both the exiting and traversing nerve roots were decompressed on both sides until a coronary dilator passed easily along the nerve roots. Once the decompression was complete, I turned my attention to the posterior lower lumbar interbody fusion. The epidural venous vasculature was coagulated and cut sharply. The disc space was covered by a large overgrown osteophytes and also the sacrum. On the left-hand side I was able to bite away a drill away the osteophyte and identify the disc space and insert a 7 mm distractor. However he did not distract the disc space. The disc space would not move I did not feel that a posterior lumbar interbody fusion was then able to be performed. I felt the risk of doing so outweigh the potential benefits. I therefore decided to perform a posterior lateral fusion. I dissected out over the facets to expose the transverse processes. We then turned our attention to the placement of the pedicle screws. The pedicle screw entry zones were identified utilizing surface landmarks and fluoroscopy.  I probed each pedicle with the pedicle probe and tapped each pedicle with the appropriate tap. We palpated with a ball probe to assure no break in the cortex. We then placed 6 5 x 45 mm screws into the pedicles bilaterally at L4. Placed 6 5 x 40 mm pedicle screws into the sacrum bilaterally. We then decorticated the transverse processes and laid a  mixture of morcellized autograft and allograft out over these to perform intertransverse arthrodesis at L5-S1  bilaterally. We then placed lordotic rods into the multiaxial screw heads of the pedicle screws and locked these in position with the locking caps and anti-torque device. We then checked our construct with AP and lateral fluoroscopy. Irrigated with copious amounts of bacitracin-containing saline solution. Placed a medium Hemovac drain through separate stab incision. Inspected the nerve roots once again to assure adequate decompression, lined to the dura with Gelfoam, and closed the muscle and the fascia with 0 Vicryl. Closed the subcutaneous tissues with 2-0 Vicryl and subcuticular tissues with 3-0 Vicryl. The skin was closed with benzoin and Steri-Strips. Dressing was then applied, the patient was awakened from general anesthesia and transported to the recovery room in stable condition. At the end of the procedure all sponge, needle and instrument counts were correct.   PLAN OF CARE: Admit to inpatient   PATIENT DISPOSITION:  PACU - hemodynamically stable.   Delay start of Pharmacological VTE agent (>24hrs) due to surgical blood loss or risk of bleeding:  yes

## 2013-04-03 NOTE — Anesthesia Preprocedure Evaluation (Signed)
Anesthesia Evaluation  Patient identified by MRN, date of birth, ID band Patient awake    Reviewed: Allergy & Precautions, H&P , NPO status , Patient's Chart, lab work & pertinent test results  History of Anesthesia Complications Negative for: history of anesthetic complications  Airway Mallampati: I TM Distance: >3 FB Neck ROM: Full    Dental  (+) Teeth Intact and Dental Advisory Given   Pulmonary asthma , sleep apnea and Continuous Positive Airway Pressure Ventilation , former smoker,  breath sounds clear to auscultation  Pulmonary exam normal       Cardiovascular + CAD ('08 cath: non-obstructive, normal LVF) Rhythm:Regular Rate:Normal  Walks 5 flights of stairs, 2-3 miles several times a week without chest pain or dyspnea   Neuro/Psych Depression Chronic back pain: narcotics    GI/Hepatic Neg liver ROS, GERD-  Medicated and Controlled,  Endo/Other  diabetes (diet controlled)Morbid obesity  Renal/GU negative Renal ROS     Musculoskeletal   Abdominal (+) + obese,   Peds  Hematology   Anesthesia Other Findings   Reproductive/Obstetrics                           Anesthesia Physical Anesthesia Plan  ASA: III  Anesthesia Plan: General   Post-op Pain Management:    Induction: Intravenous  Airway Management Planned: Oral ETT  Additional Equipment:   Intra-op Plan:   Post-operative Plan:   Informed Consent: I have reviewed the patients History and Physical, chart, labs and discussed the procedure including the risks, benefits and alternatives for the proposed anesthesia with the patient or authorized representative who has indicated his/her understanding and acceptance.   Dental advisory given  Plan Discussed with: Surgeon and CRNA  Anesthesia Plan Comments: (Plan routine monitors, GETA)        Anesthesia Quick Evaluation

## 2013-04-03 NOTE — Anesthesia Procedure Notes (Signed)
Procedure Name: Intubation Date/Time: 04/03/2013 1:41 PM Performed by: Maude Leriche D Pre-anesthesia Checklist: Patient identified, Emergency Drugs available, Suction available, Patient being monitored and Timeout performed Patient Re-evaluated:Patient Re-evaluated prior to inductionOxygen Delivery Method: Circle system utilized Preoxygenation: Pre-oxygenation with 100% oxygen Intubation Type: IV induction Ventilation: Mask ventilation without difficulty Laryngoscope Size: Miller and 2 Grade View: Grade I Tube type: Oral Tube size: 7.5 mm Number of attempts: 1 Airway Equipment and Method: Stylet Placement Confirmation: ETT inserted through vocal cords under direct vision,  positive ETCO2 and breath sounds checked- equal and bilateral Secured at: 23 cm Tube secured with: Tape Dental Injury: Teeth and Oropharynx as per pre-operative assessment

## 2013-04-03 NOTE — Progress Notes (Signed)
RT placed patient on cpap per home nasal mask and settings of 10cmH20 with humidity. Patient is tolerating cpap well at this time.

## 2013-04-03 NOTE — Transfer of Care (Signed)
Immediate Anesthesia Transfer of Care Note  Patient: [name].  Procedure(s) Performed: Procedure(s) with comments: Lumbar five-Sacral one Posterior Lumbar Interbody Fusion (N/A) - Lumbar five-Sacral one Posterior Lumbar Interbody Fusion  Patient Location: PACU  Anesthesia Type:General  Level of Consciousness: awake  Airway & Oxygen Therapy: Patient Spontanous Breathing and Patient connected to face mask oxygen  Post-op Assessment: Report given to PACU RN and Post -op Vital signs reviewed and stable  Post vital signs: Reviewed and stable  Complications: No apparent anesthesia complications

## 2013-04-04 LAB — GLUCOSE, CAPILLARY
Glucose-Capillary: 198 mg/dL — ABNORMAL HIGH (ref 70–99)
Glucose-Capillary: 172 mg/dL — ABNORMAL HIGH (ref 70–99)
Glucose-Capillary: 253 mg/dL — ABNORMAL HIGH (ref 70–99)
Glucose-Capillary: 150 mg/dL — ABNORMAL HIGH (ref 70–99)

## 2013-04-04 NOTE — Progress Notes (Signed)
Inpatient Diabetes Program Recommendations  AACE/ADA: New Consensus Statement on Inpatient Glycemic Control (2013)  Target Ranges:  Prepandial:   less than 140 mg/dL      Peak postprandial:   less than 180 mg/dL (1-2 hours)      Critically ill patients:  140 - 180 mg/dL   Reason for Visit: Hyperglycemia  Results for AVIER, JECH (MRN [id number]) as of 04/04/2013 14:05  Ref. Range 04/03/2013 12:09 04/03/2013 22:20 04/04/2013 08:47 04/04/2013 13:00  Glucose-Capillary Latest Range: 70-99 mg/dL 181 (H) 277 (H) 198 (H) 172 (H)  Results for BRUNO, LEACH (MRN [id number]) as of 04/04/2013 14:05  Ref. Range 04/03/2013 12:09 04/03/2013 22:20 04/04/2013 08:47 04/04/2013 13:00  Glucose-Capillary Latest Range: 70-99 mg/dL 181 (H) 277 (H) 198 (H) 172 (H)    Inpatient Diabetes Program Recommendations Correction (SSI): Add Novolog moderate tidwc and HS HgbA1C: Check HgbA1C to assess glycemic control prior to hospitalization  Note: Will follow. Thank you. Lorenda Peck, RD, LDN, CDE Inpatient Diabetes Coordinator [phone]   Inpatient Diabetes Program Recommendations  AACE/ADA: New Consensus Statement on Inpatient Glycemic Control (2013)  Target Ranges:  Prepandial:   less than 140 mg/dL      Peak postprandial:   less than 180 mg/dL (1-2 hours)      Critically ill patients:  140 - 180 mg/dL

## 2013-04-04 NOTE — Evaluation (Signed)
Physical Therapy Evaluation Patient Details Name: [name]. MRN: [id number] DOB: April 06, 1945 Today's Date: 04/04/2013 Time: 1129-1208 PT Time Calculation (min): 39 min  PT Assessment / Plan / Recommendation History of Present Illness  68 y.o. male admitted to [facility] on 04/03/13 for elective decompressive lumbar laminectomy, posterior fixation, and intertransverse arthrodesis  L5-S1 .   Clinical Impression  Pt is POD #1 s/p lumbar surgery and he is moving well.  Education reviewed and practiced.  Pt showed proficiency on the stairs and handout given to reinforce education.  Pt has no further acute or f/u PT needs at this time.  PT to sign off.      PT Assessment  Patent does not need any further PT services    Follow Up Recommendations  No PT follow up;Supervision - Intermittent    Does the patient have the potential to tolerate intense rehabilitation     NA  Barriers to Discharge  none      Equipment Recommendations  None recommended by PT    Recommendations for Other Services   None  Frequency   NA- one time eval and d/c   Precautions / Restrictions Precautions Precautions: Back Precaution Booklet Issued: Yes (comment) Precaution Comments: handout given and reviewed with pt inculding functional examples of when he may be breaking his back precautions, brace use, log roll technique, lifting restrictions, and encouraged activities.   Required Braces or Orthoses: Spinal Brace Spinal Brace: Lumbar corset;Applied in sitting position   Pertinent Vitals/Pain See vitals flow sheet.       Mobility  Bed Mobility Overal bed mobility: Needs Assistance Bed Mobility: Rolling;Sidelying to Sit;Sit to Sidelying Rolling: Supervision Sidelying to sit: Supervision Sit to sidelying: Supervision General bed mobility comments: Verbal cues on log roll technique and best sequencing to avoid twisting.  Transfers Overall transfer level: Modified independent Equipment used:  None General transfer comment: uses upper extremities to control transitions Ambulation/Gait Ambulation/Gait assistance: Modified independent (Device/Increase time) Ambulation Distance (Feet): 300 Feet Assistive device: None Gait Pattern/deviations: Trunk flexed General Gait Details: Verbal cues for upright posture.  Pt is flexed at trunk and hips during gait.  He reports he walked like this PTA due to pain.  Stairs: Yes Stairs assistance: Modified independent (Device/Increase time) Stair Management: One rail Left;Step to pattern;Forwards Number of Stairs: 1 (x 5) General stair comments: Verbal cues for leg sequencing, safety, verbal cues to stop going up and down one step.      Exercises Other Exercises Other Exercises: Educated re: walking being his main form of exercise when he goes home, not to sit or stand statically for greater than 30-45 mins at a time to help decrease stiffness and pain.         PT Goals(Current goals can be found in the care plan section) Acute Rehab PT Goals Patient Stated Goal: be able to do more PT Goal Formulation: No goals set, d/c therapy  Visit Information  Last PT Received On: 04/04/13 Assistance Needed: +1 History of Present Illness: 68 y.o. male admitted to [facility] on 04/03/13 for elective decompressive lumbar laminectomy, posterior fixation, and intertransverse arthrodesis  L5-S1 .        Prior [place] expects to be discharged to:: Private residence Living Arrangements: Spouse/significant other Available Help at Discharge: Available PRN/intermittently;Family Type of Home: House Home Access: Stairs to enter [employer] of Steps: 1 Entrance Stairs-Rails: None Home Layout: One level Home Equipment: Walker - 2 wheels;Bedside commode;Shower seat;Adaptive equipment Adaptive Equipment:  Reacher;Sock aid;Long-handled shoe horn;Long-handled sponge Additional Comments: Pt reports that he has had many ortho  surgeries including: R hip replacement (posterior) with revision, bil knee replacements. Prior Function Level of Independence: Independent Communication Communication: No difficulties (pt is a bit verbouse, needs redirection) Dominant Hand: Right    Cognition  Cognition Arousal/Alertness: Awake/alert Behavior During Therapy: WFL for tasks assessed/performed Overall Cognitive Status: Within Functional Limits for tasks assessed    Extremity/Trunk Assessment Upper Extremity Assessment Upper Extremity Assessment: Defer to OT evaluation Lower Extremity Assessment Lower Extremity Assessment: Overall WFL for tasks assessed (B TKA. R THA (post)) Cervical / Trunk Assessment Cervical / Trunk Assessment: Normal   Balance Balance Overall balance assessment: Modified Independent  End of Session PT - End of Session Equipment Utilized During Treatment: Gait belt;Back brace Activity Tolerance: Patient limited by fatigue;Patient limited by pain Patient left: in bed;with call bell/phone within reach;Other (comment) (seated EOB) Nurse Communication: Mobility status    [name], PT, DPT [phone]   04/04/2013, 3:45 PM

## 2013-04-04 NOTE — Progress Notes (Signed)
Patient ID: Ronald Pavlov., male   DOB: 1945-04-21, 68 y.o.   MRN: [id number] Subjective: Patient reports he's got appropriate back soreness, minimal leg pain, walking well and feels more coordinated.  Objective: Vital signs in last 24 hours: Temp:  [97.7 F (36.5 C)-98.8 F (37.1 C)] 97.7 F (36.5 C) (01/22 0752) Pulse Rate:  [63-97] 97 (01/22 0752) Resp:  [7-20] 18 (01/22 0752) BP: (93-145)/(62-76) 135/63 mmHg (01/22 0752) SpO2:  [93 %-99 %] 97 % (01/22 0752) FiO2 (%):  [98 %] 98 % (01/21 1838)  Intake/Output from previous day: 01/21 0701 - 01/22 0700 In: 2950 [I.V.:2800; Blood:150] Out: 1950 [Urine:1200; Drains:350; Blood:400] Intake/Output this shift: Total I/O In: -  Out: 400 [Urine:400]  Neurologic: Grossly normal  Lab Results: Lab Results  Component Value Date   WBC 5.6 03/29/2013   HGB 13.9 03/29/2013   HCT 40.8 03/29/2013   MCV 87.7 03/29/2013   PLT 239 03/29/2013   Lab Results  Component Value Date   INR 1.00 03/29/2013   BMET Lab Results  Component Value Date   NA 141 03/29/2013   K 4.2 03/29/2013   CL 105 03/29/2013   CO2 24 03/29/2013   GLUCOSE 167* 03/29/2013   BUN 8 03/29/2013   CREATININE 0.81 03/29/2013   CALCIUM 9.0 03/29/2013    Studies/Results: Dg Lumbar Spine 2-3 Views  04/03/2013   CLINICAL DATA:  Spondylolisthesis.  Spondylolysis.  EXAM: LUMBAR SPINE - 2-3 VIEW; DG C-ARM 61-120 MIN  COMPARISON:  MRI dated 02/20/2013  FINDINGS: AP and lateral C-arm images demonstrate the patient is undergoing posterior fusion at L5-S1. Pedicle screws and posterior rods appear in good position.  IMPRESSION: Posterior fusion performed at L5-S1.   Electronically Signed   By: Rozetta Nunnery M.D.   On: 04/03/2013 17:26   Dg C-arm 61-120 Min  04/03/2013   CLINICAL DATA:  Spondylolisthesis.  Spondylolysis.  EXAM: LUMBAR SPINE - 2-3 VIEW; DG C-ARM 61-120 MIN  COMPARISON:  MRI dated 02/20/2013  FINDINGS: AP and lateral C-arm images demonstrate the patient is undergoing  posterior fusion at L5-S1. Pedicle screws and posterior rods appear in good position.  IMPRESSION: Posterior fusion performed at L5-S1.   Electronically Signed   By: Rozetta Nunnery M.D.   On: 04/03/2013 17:26    Assessment/Plan: Seems to be doing well. Likely home tomorrow.   LOS: 1 day    Mallorey Odonell S 04/04/2013, 9:51 AM

## 2013-04-04 NOTE — Progress Notes (Signed)
Occupational Therapy Evaluation Patient Details Name: [name]. MRN: [id number] DOB: 15-Oct-1945 Today's Date: 04/04/2013 Time: 7371-0626 OT Time Calculation (min): 29 min  OT Assessment / Plan / Recommendation History of present illness   Decompressive lumbar laminectomy in a Gill-type fashion L5-S1 in order to adequately decompress the neural elements and address the spinal stenosis  2. Posterior fixation L5-S1 using Nuvasive pedicle screws.  3. Intertransverse arthrodesis L5-S1 using morcellized autograft and allograft    Clinical Impression   Completed all education for ADL/mobility for ADL adhering to back precautions. Pt has all DME and AE nec from previous surgeries. Pt s/p R THA 2 years ago. Rec for pt to limit ER R hip to decrease risk of dislocation.  Pt given handout. Pt able to return demonstrate techniques. OT signing off.    OT Assessment  Patient does not need any further OT services    Follow Up Recommendations  No OT follow up;Supervision - Intermittent    Barriers to Discharge      Equipment Recommendations  None recommended by OT    Recommendations for Other Services    Frequency    eval only   Precautions / Restrictions Precautions Precautions: Back Precaution Booklet Issued: Yes (comment) Precaution Comments: L 4-5 fusion Required Braces or Orthoses: Spinal Brace Spinal Brace: Lumbar corset;Applied in sitting position Restrictions Weight Bearing Restrictions: No   Pertinent Vitals/Pain C/o back pain. Repositioned in side lying Ice to back    ADL  Grooming: Supervision/safety Where Assessed - Grooming: Unsupported standing Upper Body Bathing: Supervision/safety;Set up Where Assessed - Upper Body Bathing: Unsupported sitting Lower Body Bathing: Minimal assistance Where Assessed - Lower Body Bathing: Unsupported sit to stand Upper Body Dressing: Set up;Supervision/safety Where Assessed - Upper Body Dressing: Unsupported sitting Lower Body  Dressing: Minimal assistance Where Assessed - Lower Body Dressing: Unsupported sit to stand Toilet Transfer: Supervision/safety Toilet Transfer Method: Other (comment) (ambulating) Science writer: Comfort height toilet Toileting - Clothing Manipulation and Hygiene: Modified independent Where Assessed - Toileting Clothing Manipulation and Hygiene: Sit to stand from 3-in-1 or toilet Equipment Used: Long-handled shoe horn;Long-handled sponge;Reacher;Sock aid Transfers/Ambulation Related to ADLs: S for back precuaitons ADL Comments: Had previous THA RLE. Rec for pt to limit amount of ER he uses at hips for LB ADL, as pt appears very tight in ER and has increased risk of dislocating hip. Educated pt on AE/DME use for ADL following back precations. All questions ansered. also diiscussed home safety and proper set up of home to promote safety and independence    OT Diagnosis:    OT Problem List:   OT Treatment Interventions:     OT Goals(Current goals can be found in the care plan section) Acute Rehab OT Goals Patient Stated Goal: be able to do more  Visit Information  Last OT Received On: 04/04/13 Assistance Needed: +1       Prior [place] expects to be discharged to:: Private residence Living Arrangements: Spouse/significant other Available Help at Discharge: Available PRN/intermittently;Family Type of Home: House Home Layout: One level Home Equipment: [place] - 2 wheels;Bedside commode;Shower seat;Adaptive equipment Adaptive Equipment: Reacher;Sock aid;Long-handled shoe horn;Long-handled sponge Prior Function Level of Independence: Independent Communication Communication: No difficulties         Vision/Perception     Cognition  Cognition Arousal/Alertness: Awake/alert Behavior During Therapy: WFL for tasks assessed/performed Overall Cognitive Status: Within Functional Limits for tasks assessed    Extremity/Trunk Assessment  Upper Extremity Assessment  Upper Extremity Assessment: Overall WFL for tasks assessed Lower Extremity Assessment Lower Extremity Assessment: Defer to PT evaluation (B TKA. R THA (post)) Cervical / Trunk Assessment Cervical / Trunk Assessment: Normal     Mobility Bed Mobility Overal bed mobility: Needs Assistance Bed Mobility: Sidelying to Sit;Sit to Sidelying Sidelying to sit: Supervision Sit to sidelying: Supervision General bed mobility comments: vc on log rolling Transfers Overall transfer level: Modified independent     Exercise     Balance Balance Overall balance assessment: Modified Independent   End of Session OT - End of Session Equipment Utilized During Treatment: Back brace Activity Tolerance: Patient tolerated treatment well Patient left: in bed;with call bell/phone within reach;with nursing/sitter in room Nurse Communication: Mobility status  GO     Azoria Abbett,HILLARY 04/04/2013, 1:53 PM [facility], OTR/L  [phone] 04/04/2013

## 2013-04-04 NOTE — Progress Notes (Signed)
UR completed 

## 2013-04-04 NOTE — Plan of Care (Signed)
Problem: Consults Goal: Diagnosis - Spinal Surgery Outcome: Completed/Met Date Met:  04/04/13 [place]aco/Lumbar Spine Fusion

## 2013-04-05 LAB — GLUCOSE, CAPILLARY: GLUCOSE-CAPILLARY: 133 mg/dL — AB (ref 70–99)

## 2013-04-05 MED ORDER — METHOCARBAMOL 500 MG PO TABS: 500.0000 mg | ORAL_TABLET | Freq: Four times a day (QID) | ORAL | Status: DC | PRN

## 2013-04-05 MED ORDER — HYDROCODONE-ACETAMINOPHEN 10-325 MG PO TABS: 1.0000 | ORAL_TABLET | Freq: Four times a day (QID) | ORAL | Status: DC | PRN

## 2013-04-05 NOTE — Progress Notes (Signed)
Pt doing well. Pt and wife given D/C instructions with Rx's, verbal understanding. Pt D/C'd home via wheelchair @ 1220 per MD order. Pt stable @ D/C and had no other needs. Holli Humbles, RN

## 2013-04-05 NOTE — Discharge Summary (Signed)
Physician Discharge Summary  Patient ID: [name]. MRN: [id number] DOB/AGE: 1946/02/09 68 y.o.  Admit date: 04/03/2013 Discharge date: 04/05/2013  Admission Diagnoses: Spondylolisthesis with stenosis L5-S1   Discharge Diagnoses: Same   Discharged Condition: good  Hospital Course: The patient was admitted on 04/03/2013 and taken to the operating room where the patient underwent posterior bar decompression and fusion L5-S1. The patient tolerated the procedure well and was taken to the recovery room and then to the floor in stable condition. The hospital course was routine. There were no complications. The wound remained clean dry and intact. Pt had appropriate back soreness. No complaints of leg pain or new N/T/W. The patient remained afebrile with stable vital signs, and tolerated a regular diet. The patient continued to increase activities, and pain was well controlled with oral pain medications.   Consults: None  Significant Diagnostic Studies:  Results for orders placed during the hospital encounter of 04/03/13  GLUCOSE, CAPILLARY      Result Value Range   Glucose-Capillary 181 (*) 70 - 99 mg/dL  GLUCOSE, CAPILLARY      Result Value Range   Glucose-Capillary 277 (*) 70 - 99 mg/dL  GLUCOSE, CAPILLARY      Result Value Range   Glucose-Capillary 198 (*) 70 - 99 mg/dL   Comment 1 Notify RN     Comment 2 Documented in Chart    GLUCOSE, CAPILLARY      Result Value Range   Glucose-Capillary 172 (*) 70 - 99 mg/dL   Comment 1 Notify RN     Comment 2 Documented in Chart    GLUCOSE, CAPILLARY      Result Value Range   Glucose-Capillary 253 (*) 70 - 99 mg/dL   Comment 1 Notify RN     Comment 2 Documented in Chart    GLUCOSE, CAPILLARY      Result Value Range   Glucose-Capillary 150 (*) 70 - 99 mg/dL  GLUCOSE, CAPILLARY      Result Value Range   Glucose-Capillary 133 (*) 70 - 99 mg/dL   Comment 1 Notify RN     Comment 2 Documented in Chart      Dg Lumbar Spine 2-3  Views  04/03/2013   CLINICAL DATA:  Spondylolisthesis.  Spondylolysis.  EXAM: LUMBAR SPINE - 2-3 VIEW; DG C-ARM 61-120 MIN  COMPARISON:  MRI dated 02/20/2013  FINDINGS: AP and lateral C-arm images demonstrate the patient is undergoing posterior fusion at L5-S1. Pedicle screws and posterior rods appear in good position.  IMPRESSION: Posterior fusion performed at L5-S1.   Electronically Signed   By: Rozetta Nunnery M.D.   On: 04/03/2013 17:26   Mr Cervical Spine Wo Contrast  03/13/2013   CLINICAL DATA:  Increasing gait disorder for 18 months. Cervical pain.  EXAM: MRI CERVICAL SPINE WITHOUT CONTRAST  TECHNIQUE: Multiplanar, multisequence MR imaging was performed. No intravenous contrast was administered.  COMPARISON:  None.  FINDINGS: The cervical cord is normal in size and signal. Vertebral body heights are maintained. There is degenerative disc disease at C4-5, C5-6 C6-7 and C7-T1. The cervical spine is normal in lordotic alignment. No static listhesis. Bone marrow signal is normal. Cerebellar tonsils are normal in position.  C2-3: Minimal broad-based disc bulge. Mild left facet arthropathy. Mild left foraminal stenosis. No right foraminal stenosis. No central canal stenosis.  C3-4: Mild broad-based disc bulge. Bilateral uncovertebral degenerative changes. Moderate bilateral foraminal stenosis. No central canal stenosis.  C4-5: Moderate left paracentral disc protrusion with mass effect deforming  the left paracentral ventral cervical spinal cord. There is bilateral facet arthropathy and uncovertebral degenerative changes. There is severe left foraminal stenosis. There is moderate right foraminal stenosis.  C5-6: Mild broad-based disc bulge. No neural foraminal stenosis. No central canal stenosis.  C6-7: Moderate left paracentral disc bulge effacing the left paracentral CSF space. No neural foraminal stenosis. No central canal stenosis.  C7-T1 small central disc bulge. No neural foraminal stenosis. No central canal  stenosis.  On the sagittal images there are mild broad-based disc bulges at T1-2 and T2-3.  IMPRESSION: 1. At C4-5 there is a moderate left paracentral disc protrusion with mass effect deforming the left paracentral ventral cervical spinal cord. There is bilateral facet arthropathy and uncovertebral degenerative changes resulting in severe left foraminal stenosis and moderate right foraminal stenosis.  2.  Cervical spondylosis as described above.   Electronically Signed   By: Kathreen Devoid   On: 03/13/2013 11:32   Dg C-arm 61-120 Min  04/03/2013   CLINICAL DATA:  Spondylolisthesis.  Spondylolysis.  EXAM: LUMBAR SPINE - 2-3 VIEW; DG C-ARM 61-120 MIN  COMPARISON:  MRI dated 02/20/2013  FINDINGS: AP and lateral C-arm images demonstrate the patient is undergoing posterior fusion at L5-S1. Pedicle screws and posterior rods appear in good position.  IMPRESSION: Posterior fusion performed at L5-S1.   Electronically Signed   By: Rozetta Nunnery M.D.   On: 04/03/2013 17:26    Antibiotics:  Anti-infectives   Start     Dose/Rate Route Frequency Ordered Stop   04/03/13 2145  ceFAZolin (ANCEF) IVPB 1 g/50 mL premix     1 g 100 mL/hr over 30 Minutes Intravenous Every 8 hours 04/03/13 1938 04/04/13 0714   04/03/13 1427  bacitracin 50,000 Units in sodium chloride irrigation 0.9 % 500 mL irrigation  Status:  Discontinued       As needed 04/03/13 1427 04/03/13 1743   04/03/13 0600  ceFAZolin (ANCEF) 3 g in dextrose 5 % 50 mL IVPB     3 g 160 mL/hr over 30 Minutes Intravenous On call to O.R. 04/02/13 1424 04/03/13 1344      Discharge Exam: Blood pressure 130/79, pulse 61, temperature 98.6 F (37 C), temperature source Oral, resp. rate 16, SpO2 98.00%. Neurologic: Grossly normal Incision clean dry and intact  Discharge Medications:     Medication List         albuterol 108 (90 BASE) MCG/ACT inhaler  Commonly known as:  PROVENTIL HFA;VENTOLIN HFA  Inhale 2 puffs into the lungs every 6 (six) hours as needed  for wheezing or shortness of breath.     ALPRAZolam 1 MG tablet  Commonly known as:  XANAX  Take 0.5 mg by mouth 3 (three) times daily as needed for sleep.     budesonide-formoterol 80-4.5 MCG/ACT inhaler  Commonly known as:  SYMBICORT  Inhale 2 puffs into the lungs 2 (two) times daily.     Dutasteride-Tamsulosin HCl 0.5-0.4 MG Caps  Take 1 capsule by mouth daily before breakfast.     HYDROcodone-acetaminophen 10-325 MG per tablet  Commonly known as:  NORCO  Take 1-2 tablets by mouth every 6 (six) hours as needed for moderate pain.     lithium carbonate 450 MG CR tablet  Commonly known as:  ESKALITH  Take 1,350 mg by mouth at bedtime.     metFORMIN 500 MG tablet  Commonly known as:  GLUCOPHAGE  Take 1,000 mg by mouth 2 (two) times daily with a meal.     methocarbamol  500 MG tablet  Commonly known as:  ROBAXIN  Take 1 tablet (500 mg total) by mouth every 6 (six) hours as needed for muscle spasms.     methylphenidate 54 MG CR tablet  Commonly known as:  CONCERTA  Take 54 mg by mouth daily before breakfast.     omeprazole 20 MG capsule  Commonly known as:  PRILOSEC  Take 20 mg by mouth daily before breakfast.     traMADol 50 MG tablet  Commonly known as:  ULTRAM  Take 50 mg by mouth every 6 (six) hours as needed for moderate pain.     zolpidem 10 MG tablet  Commonly known as:  AMBIEN  Take 10 mg by mouth at bedtime as needed for sleep.        Disposition: Home   Final Dx: Posterior lumbar fusion with instrumentation L5-S1      Discharge Orders   Future Appointments Provider Department Dept Phone   05/03/2013 9:00 AM Dennie Bible, NP [facility] [phone]   11/04/2013 9:00 AM Kathee Delton, MD [facility] [phone]   Future Orders Complete By Expires   Call MD for:  difficulty breathing, headache or visual disturbances  As directed    Call MD for:  persistant nausea and vomiting  As directed    Call MD for:  redness,  tenderness, or signs of infection (pain, swelling, redness, odor or green/yellow discharge around incision site)  As directed    Call MD for:  severe uncontrolled pain  As directed    Call MD for:  temperature >100.4  As directed    Diet - low sodium heart healthy  As directed    Discharge instructions  As directed    Comments:     No bending or twisting, no driving, no lifting more than 10 pounds. May shower normally   Increase activity slowly  As directed       Follow-up Information   Follow up with Atlee Villers S, MD. Schedule an appointment as soon as possible for a visit in 2 weeks.   Specialty:  Neurosurgery   Contact information:   [address] [place] [address] [phone]        Signed: Eustace Moore 04/05/2013, 9:51 AM

## 2013-04-08 MED FILL — Heparin Sodium (Porcine) Inj 1000 Unit/ML: INTRAMUSCULAR | Qty: 30 | Status: AC

## 2013-04-08 MED FILL — Sodium Chloride IV Soln 0.9%: INTRAVENOUS | Qty: 2000 | Status: AC

## 2013-04-10 LAB — GLUCOSE, CAPILLARY: Glucose-Capillary: 205 mg/dL — ABNORMAL HIGH (ref 70–99)

## 2013-05-02 ENCOUNTER — Telehealth: Payer: Self-pay | Admitting: Neurology

## 2013-05-02 NOTE — Telephone Encounter (Signed)
Patient's wife called to cancel patient's appointment with Dr. Krista Blue since he is recovering from surgery, says that she wrote a letter already and sent it to administrator regarding switching doctors from Dr. Krista Blue to Dr. Jannifer Franklin. Patient wants to be established with Dr. Jannifer Franklin. Please call.

## 2013-05-02 NOTE — Telephone Encounter (Signed)
My schedule is rather full at the moment, would prefer the patient would see another physician, possibly Dr. Janann Colonel, as he treats movement disorder such as tremor.

## 2013-05-03 ENCOUNTER — Ambulatory Visit: Payer: Medicare Other | Admitting: Nurse Practitioner

## 2013-05-03 NOTE — Telephone Encounter (Signed)
Would you be willing to take this patient, formerly Dr Rhea Belton

## 2013-05-03 NOTE — Telephone Encounter (Signed)
That is fine if the patient is ok with it.

## 2013-05-06 NOTE — Telephone Encounter (Signed)
Called and left VM message to schedule appt with Dr Janann Colonel, who has agreed to have patient reassigned to his schedule.

## 2013-05-23 DIAGNOSIS — Z79899 Other long term (current) drug therapy: Secondary | ICD-10-CM | POA: Diagnosis not present

## 2013-05-23 DIAGNOSIS — N390 Urinary tract infection, site not specified: Secondary | ICD-10-CM | POA: Diagnosis not present

## 2013-05-23 DIAGNOSIS — Z125 Encounter for screening for malignant neoplasm of prostate: Secondary | ICD-10-CM | POA: Diagnosis not present

## 2013-05-23 DIAGNOSIS — E789 Disorder of lipoprotein metabolism, unspecified: Secondary | ICD-10-CM | POA: Diagnosis not present

## 2013-05-23 DIAGNOSIS — E119 Type 2 diabetes mellitus without complications: Secondary | ICD-10-CM | POA: Diagnosis not present

## 2013-05-27 DIAGNOSIS — M549 Dorsalgia, unspecified: Secondary | ICD-10-CM | POA: Diagnosis not present

## 2013-05-27 DIAGNOSIS — E663 Overweight: Secondary | ICD-10-CM | POA: Diagnosis not present

## 2013-05-28 DIAGNOSIS — R059 Cough, unspecified: Secondary | ICD-10-CM | POA: Diagnosis not present

## 2013-05-28 DIAGNOSIS — R05 Cough: Secondary | ICD-10-CM | POA: Diagnosis not present

## 2013-05-28 DIAGNOSIS — J310 Chronic rhinitis: Secondary | ICD-10-CM | POA: Diagnosis not present

## 2013-05-28 DIAGNOSIS — J45909 Unspecified asthma, uncomplicated: Secondary | ICD-10-CM | POA: Diagnosis not present

## 2013-05-28 DIAGNOSIS — J309 Allergic rhinitis, unspecified: Secondary | ICD-10-CM | POA: Diagnosis not present

## 2013-05-30 DIAGNOSIS — IMO0002 Reserved for concepts with insufficient information to code with codable children: Secondary | ICD-10-CM | POA: Diagnosis not present

## 2013-05-30 DIAGNOSIS — R0609 Other forms of dyspnea: Secondary | ICD-10-CM | POA: Diagnosis not present

## 2013-05-30 DIAGNOSIS — D649 Anemia, unspecified: Secondary | ICD-10-CM | POA: Diagnosis not present

## 2013-06-06 ENCOUNTER — Ambulatory Visit: Payer: Self-pay | Admitting: Neurology

## 2013-07-01 DIAGNOSIS — M76899 Other specified enthesopathies of unspecified lower limb, excluding foot: Secondary | ICD-10-CM | POA: Diagnosis not present

## 2013-07-01 DIAGNOSIS — M545 Low back pain, unspecified: Secondary | ICD-10-CM | POA: Diagnosis not present

## 2013-07-12 ENCOUNTER — Ambulatory Visit: Payer: Self-pay | Admitting: Neurology

## 2013-07-18 DIAGNOSIS — IMO0002 Reserved for concepts with insufficient information to code with codable children: Secondary | ICD-10-CM | POA: Diagnosis not present

## 2013-07-18 DIAGNOSIS — F3175 Bipolar disorder, in partial remission, most recent episode depressed: Secondary | ICD-10-CM | POA: Diagnosis not present

## 2013-07-29 DIAGNOSIS — M549 Dorsalgia, unspecified: Secondary | ICD-10-CM | POA: Diagnosis not present

## 2013-07-29 DIAGNOSIS — Q762 Congenital spondylolisthesis: Secondary | ICD-10-CM | POA: Diagnosis not present

## 2013-08-01 ENCOUNTER — Ambulatory Visit: Payer: Medicare Other | Attending: Neurological Surgery | Admitting: Physical Therapy

## 2013-08-01 DIAGNOSIS — E119 Type 2 diabetes mellitus without complications: Secondary | ICD-10-CM | POA: Diagnosis not present

## 2013-08-01 DIAGNOSIS — Q762 Congenital spondylolisthesis: Secondary | ICD-10-CM | POA: Insufficient documentation

## 2013-08-01 DIAGNOSIS — R5381 Other malaise: Secondary | ICD-10-CM | POA: Diagnosis not present

## 2013-08-01 DIAGNOSIS — Z96659 Presence of unspecified artificial knee joint: Secondary | ICD-10-CM | POA: Insufficient documentation

## 2013-08-01 DIAGNOSIS — IMO0001 Reserved for inherently not codable concepts without codable children: Secondary | ICD-10-CM | POA: Insufficient documentation

## 2013-08-01 DIAGNOSIS — M545 Low back pain, unspecified: Secondary | ICD-10-CM | POA: Diagnosis not present

## 2013-08-06 ENCOUNTER — Ambulatory Visit: Payer: Medicare Other | Admitting: Physical Therapy

## 2013-08-06 DIAGNOSIS — IMO0001 Reserved for inherently not codable concepts without codable children: Secondary | ICD-10-CM | POA: Diagnosis not present

## 2013-08-08 ENCOUNTER — Ambulatory Visit: Payer: Medicare Other | Admitting: Physical Therapy

## 2013-08-08 DIAGNOSIS — IMO0001 Reserved for inherently not codable concepts without codable children: Secondary | ICD-10-CM | POA: Diagnosis not present

## 2013-08-13 ENCOUNTER — Ambulatory Visit: Payer: Medicare Other | Attending: Neurological Surgery | Admitting: *Deleted

## 2013-08-13 DIAGNOSIS — IMO0001 Reserved for inherently not codable concepts without codable children: Secondary | ICD-10-CM | POA: Diagnosis not present

## 2013-08-13 DIAGNOSIS — E119 Type 2 diabetes mellitus without complications: Secondary | ICD-10-CM | POA: Insufficient documentation

## 2013-08-13 DIAGNOSIS — R5381 Other malaise: Secondary | ICD-10-CM | POA: Diagnosis not present

## 2013-08-13 DIAGNOSIS — Q762 Congenital spondylolisthesis: Secondary | ICD-10-CM | POA: Diagnosis not present

## 2013-08-13 DIAGNOSIS — Z96659 Presence of unspecified artificial knee joint: Secondary | ICD-10-CM | POA: Insufficient documentation

## 2013-08-13 DIAGNOSIS — M545 Low back pain, unspecified: Secondary | ICD-10-CM | POA: Insufficient documentation

## 2013-08-15 ENCOUNTER — Ambulatory Visit: Payer: Medicare Other | Admitting: *Deleted

## 2013-08-15 DIAGNOSIS — E119 Type 2 diabetes mellitus without complications: Secondary | ICD-10-CM | POA: Diagnosis not present

## 2013-08-15 DIAGNOSIS — Z96659 Presence of unspecified artificial knee joint: Secondary | ICD-10-CM | POA: Diagnosis not present

## 2013-08-15 DIAGNOSIS — M545 Low back pain, unspecified: Secondary | ICD-10-CM | POA: Diagnosis not present

## 2013-08-15 DIAGNOSIS — IMO0001 Reserved for inherently not codable concepts without codable children: Secondary | ICD-10-CM | POA: Diagnosis not present

## 2013-08-15 DIAGNOSIS — R5381 Other malaise: Secondary | ICD-10-CM | POA: Diagnosis not present

## 2013-08-15 DIAGNOSIS — Q762 Congenital spondylolisthesis: Secondary | ICD-10-CM | POA: Diagnosis not present

## 2013-08-20 ENCOUNTER — Ambulatory Visit (INDEPENDENT_AMBULATORY_CARE_PROVIDER_SITE_OTHER): Payer: Medicare Other | Admitting: Neurology

## 2013-08-20 ENCOUNTER — Ambulatory Visit: Payer: Medicare Other | Admitting: Physical Therapy

## 2013-08-20 ENCOUNTER — Encounter: Payer: Self-pay | Admitting: Neurology

## 2013-08-20 VITALS — BP 134/69 | HR 72 | Ht 74.0 in | Wt 251.0 lb

## 2013-08-20 DIAGNOSIS — M545 Low back pain, unspecified: Secondary | ICD-10-CM | POA: Diagnosis not present

## 2013-08-20 DIAGNOSIS — Q762 Congenital spondylolisthesis: Secondary | ICD-10-CM | POA: Diagnosis not present

## 2013-08-20 DIAGNOSIS — R251 Tremor, unspecified: Secondary | ICD-10-CM

## 2013-08-20 DIAGNOSIS — Z96659 Presence of unspecified artificial knee joint: Secondary | ICD-10-CM | POA: Diagnosis not present

## 2013-08-20 DIAGNOSIS — E119 Type 2 diabetes mellitus without complications: Secondary | ICD-10-CM | POA: Diagnosis not present

## 2013-08-20 DIAGNOSIS — R259 Unspecified abnormal involuntary movements: Secondary | ICD-10-CM | POA: Diagnosis not present

## 2013-08-20 DIAGNOSIS — R5381 Other malaise: Secondary | ICD-10-CM | POA: Diagnosis not present

## 2013-08-20 DIAGNOSIS — IMO0001 Reserved for inherently not codable concepts without codable children: Secondary | ICD-10-CM | POA: Diagnosis not present

## 2013-08-20 MED ORDER — PRIMIDONE 50 MG PO TABS: 25.0000 mg | ORAL_TABLET | Freq: Every day | ORAL | Status: DC

## 2013-08-20 NOTE — Patient Instructions (Signed)
Overall you are doing fairly well but I do want to suggest a few things today:   Remember to drink plenty of fluid, eat healthy meals and do not skip any meals. Try to eat protein with a every meal and eat a healthy snack such as fruit or nuts in between meals. Try to keep a regular sleep-wake schedule and try to exercise daily, particularly in the form of walking, 20-30 minutes a day, if you can.   As far as your medications are concerned, I would like to suggest the following: 1)Please try primidone25mg  (1/2 tablet) nightly. Please call me in 4 weeks to update me on your status  I would like to see you back in 6 months, sooner if we need to. Please call us with any interim questions, concerns, problems, updates or refill requests.   My clinical assistant and will answer any of your questions and relay your messages to me and also relay most of my messages to you.   Our phone number is [phone]. We also have an after hours call service for urgent matters and there is a physician on-call for urgent questions. For any emergencies you know to call 911 or go to the nearest emergency room

## 2013-08-20 NOTE — Progress Notes (Signed)
[facility]    Provider:  Dr Janann Colonel Referring Provider: Anda Kraft, MD Primary Care Physician:  Dwan Bolt, MD  CC:  tremor  HPI:  [name]. is a 68 y.o. male here as a referral from Dr. Wilson Singer for tremor evaluation  Notes having tremors for around 2 years. States that they began in his hands and it has progressed since then. Describes it as more of an action/intention tremor. Minimal to no rest component. Recently has sensation of an internal tremor in his legs. Notes that his handwriting has gotten progressively worse, they feel it may have gotten smaller. Has had walking difficulty which he attributes to recent back problems, no generalized slowing down. 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 Has normal sense of smell. No change in tremor with stress or anxiety. Does not drink EtOH. In the past has taken VPA and Lamictal, currently on lithium and Zoloft. Currently on these for mood and depressed state. He takes 1350mg  of Lithium nightly, states his levels are normal.   He has been on propranolol in the past but stopped due to diagnosis of COPD/Asthma. He takes symbicort 2 puffs BID.   Recently had L5-S1 back surgery a few months ago. He had an EMG/NCS done in January which was normal.    Prior visit Dr Krista Blue 06/2012 [name] is a pleasant 68 year old right-handed Caucasian male, accompanied by his wife at today's clinical visit. I have seen him in 2011 for bilateral hand tremor, which was considered due to long-term antidepression use, including Lithium and Depakote  He has past medical history of depression, anxiety, bilateral knee replacement, right hip replacement, gastric bypass surgery with weight loss of 130 pounds in the past.  Around 2008, he noticed gradual onset, but progressive bilateral hands tremor. He noticed mild hands shaking in his late 21s, he contributed to the medicine side effect, he was given different antidepression over  extended period of time, including Lithium, lamitrogen, Valproic acid and also methylphenidate, he also reported history of concussion due to previous a football injury, he did suffered loss of consciousness, and short memory trouble, with one week of hospital stay in 1967.  His bilateral hands tremor gradually become obvious, despite the change of medications including stopped VPA, putting him back on Lithium, stopping Zoloft, currently he has difficulty with hand writing, using utensils, sometimes click on mouse unintentionally.  He was started on propanolol 20 mg 2 tablets twice a day, he reported mild improvement, he was recently diagnosed with asthma, propanolol increase his cough, and symptoms, he has to stop the propanolol, in addition, he has retired from his job as Designer, fashion/clothing, has worsening depression  He complains of worsening bilateral hands tremor, worsening handwriting, also complains of right hip pain, low back pain, gait difficulty, he denied lost sense of smell, he occasionally acts out of dreams. He is taking Lithium 450 mg 3 tablets every night  He has no children, there was no family history of tremor  He complains of worsening depression since retirement, sitting on the culture watching TV, unhealthy diet  Review of Systems: Out of a complete 14 system review, the patient complains of only the following symptoms, and all other reviewed systems are negative. + tremor, anxiety, SOB, chest tightness, depression  History   Social History  . Marital Status: Married    Spouse Name: N/A    Number of Children: N/A  . Years of Education: college   Occupational History  . Retired Ambulance person  Social History Main Topics  . Smoking status: Former Smoker -- 1.50 packs/day for 20 years    Types: Cigarettes    Quit date: 04/18/1991  . Smokeless tobacco: Never Used  . Alcohol Use: No  . Drug Use: No  . Sexual Activity: Not on file   Other Topics Concern  . Not on file    Social History Narrative   Patient works for [employer]. Patient has masters degree. Patient is married.    Family History  Problem Relation Age of Onset  . Asthma Maternal Grandmother   . Heart disease Father   . Anxiety disorder Mother     Past Medical History  Diagnosis Date  . Sleep apnea     settings 10.6  / followed by [name] study years ago  . Diabetes mellitus     PCP [name]  . Recurrent upper respiratory infection (URI)     states Dr Wynelle Link aware- no fever- states is improving  . Arthritis   . Depression     ADD  . Tremor     d/t lithium  . Asthma   . Bronchitis     hx of  . Insomnia   . BPH (benign prostatic hyperplasia)   . Complication of anesthesia 0/09/38    no complications but just says he typically needs "more than expected" to anesthetize; needs CPAP (setting 10) in recovery    Past Surgical History  Procedure Laterality Date  . Cardiac catheterization      2008 pre op gastric bypass  . Joint replacement      Left, Right Knee, Right hip  . Knee arthroscopy      right x 3-4, left x 2  . Knee arthrotomy      right  . Gastric bypass  2008  . Polypectomy      vocal cords 1992  . Total hip revision  04/22/2011    Procedure: TOTAL HIP REVISION;  Surgeon: Gearlean Alf, MD;  Location: [facility];  Service: Orthopedics;  Laterality: Right;  . Colonoscopy w/ polypectomy      Current Outpatient Prescriptions  Medication Sig Dispense Refill  . albuterol (PROVENTIL HFA;VENTOLIN HFA) 108 (90 BASE) MCG/ACT inhaler Inhale 2 puffs into the lungs every 6 (six) hours as needed for wheezing or shortness of breath.      . ALPRAZolam (XANAX) 1 MG tablet Take 0.5 mg by mouth 3 (three) times daily as needed for sleep.      . budesonide-formoterol (SYMBICORT) 80-4.5 MCG/ACT inhaler Inhale 2 puffs into the lungs 2 (two) times daily.  1 Inhaler  12  . Dutasteride-Tamsulosin HCl 0.5-0.4 MG CAPS Take 1 capsule by mouth daily before breakfast.      .  HYDROcodone-acetaminophen (NORCO) 10-325 MG per tablet Take 1-2 tablets by mouth every 6 (six) hours as needed for moderate pain.  90 tablet  0  . lithium carbonate (ESKALITH) 450 MG CR tablet Take 1,350 mg by mouth at bedtime.       . metFORMIN (GLUCOPHAGE) 500 MG tablet Take 1,000 mg by mouth 2 (two) times daily with a meal.       . methocarbamol (ROBAXIN) 500 MG tablet Take 1 tablet (500 mg total) by mouth every 6 (six) hours as needed for muscle spasms.  60 tablet  1  . methylphenidate (CONCERTA) 54 MG CR tablet Take 54 mg by mouth daily before breakfast.      . omeprazole (PRILOSEC) 20 MG capsule Take 20 mg by mouth  daily before breakfast.      . sertraline (ZOLOFT) 100 MG tablet Take 100 mg by mouth daily.      . traMADol (ULTRAM) 50 MG tablet Take 50 mg by mouth every 6 (six) hours as needed for moderate pain.      Marland Kitchen zolpidem (AMBIEN) 10 MG tablet Take 10 mg by mouth at bedtime as needed for sleep.       No current facility-administered medications for this visit.    Allergies as of 08/20/2013 - Review Complete 08/20/2013  Allergen Reaction Noted  . Demerol [meperidine] Itching 09/27/2012  . Lamotrigine  11/02/2012    Vitals: BP 134/69  Pulse 72  Ht 6\' 2"  (1.88 m)  Wt 251 lb (113.853 kg)  BMI 32.21 kg/m2 Last Weight:  Wt Readings from Last 1 Encounters:  08/20/13 251 lb (113.853 kg)   Last Height:   Ht Readings from Last 1 Encounters:  08/20/13 6\' 2"  (1.88 m)     Physical exam:  Caridac: regular rate rhythm  Pulmonary: clear to auscultation bilaterally  Neck: supple no carotid bruits  Neurological Examination  Mental Status:  pleasant, awake, alert, cooperative to history, talking, and casual conversation.  Cranial Nerves:  CN II-XII Pupils were equal round reactive to light.  Extraocular movements were full.  Visual fields were full on confrontational test.  Facial sensation and strength were normal.  Hearing was intact to finger rubbing bilaterally.  Uvula  tongue were midline.  Head turning and shoulder shrugging were normal and symmetric.  Tongue protrusion into the cheeks strength were normal.  Motor:  Normal tone, bulk, and strength. Mild bilateral hand rest tremor R>L, moderate intention tremor and postural tremor. No bradykinesia noted with finger taps, hand opening, foot taps  Deep tendon reflexes:  Biceps: 2/2, Brachioradialis: 2/2, Triceps: 2/2, Patellar: 2/2, Achilles: 2/2.  Plantar responses were flexor.  Sensory:  Normal to light touch, pinprick,   Cordination:  Normal finger-to-nose, heel-to-shin.  There was no dysmetria noticed.  Gait:  Upright, no shuffling, normal arm swing bilaterally  Assessment and plan:   1)Tremor 2)Depression/anxiety  68 year old Caucasian male, with bilateral hands tremor which is predominantly an action/postural tremor. Differential would include essential tremor vs a medication induced tremor related to multiple medication, most notably lithium. Counseled patient on this diagnosis and he expressed understanding. Will start primidone 25mg  nightly, can titrate up as indicated. Follow up as needed. o  Jim Like, DO  [facility] [address] [place], [place] [address]  Phone [phone] Fax [phone]

## 2013-08-22 ENCOUNTER — Ambulatory Visit: Payer: Medicare Other | Admitting: *Deleted

## 2013-08-22 DIAGNOSIS — M545 Low back pain, unspecified: Secondary | ICD-10-CM | POA: Diagnosis not present

## 2013-08-22 DIAGNOSIS — IMO0001 Reserved for inherently not codable concepts without codable children: Secondary | ICD-10-CM | POA: Diagnosis not present

## 2013-08-22 DIAGNOSIS — Q762 Congenital spondylolisthesis: Secondary | ICD-10-CM | POA: Diagnosis not present

## 2013-08-22 DIAGNOSIS — Z96659 Presence of unspecified artificial knee joint: Secondary | ICD-10-CM | POA: Diagnosis not present

## 2013-08-22 DIAGNOSIS — E119 Type 2 diabetes mellitus without complications: Secondary | ICD-10-CM | POA: Diagnosis not present

## 2013-08-22 DIAGNOSIS — R5381 Other malaise: Secondary | ICD-10-CM | POA: Diagnosis not present

## 2013-08-27 ENCOUNTER — Ambulatory Visit: Payer: Medicare Other | Admitting: Physical Therapy

## 2013-08-27 DIAGNOSIS — M545 Low back pain, unspecified: Secondary | ICD-10-CM | POA: Diagnosis not present

## 2013-08-27 DIAGNOSIS — E119 Type 2 diabetes mellitus without complications: Secondary | ICD-10-CM | POA: Diagnosis not present

## 2013-08-27 DIAGNOSIS — R5381 Other malaise: Secondary | ICD-10-CM | POA: Diagnosis not present

## 2013-08-27 DIAGNOSIS — IMO0001 Reserved for inherently not codable concepts without codable children: Secondary | ICD-10-CM | POA: Diagnosis not present

## 2013-08-27 DIAGNOSIS — Z96659 Presence of unspecified artificial knee joint: Secondary | ICD-10-CM | POA: Diagnosis not present

## 2013-08-27 DIAGNOSIS — Q762 Congenital spondylolisthesis: Secondary | ICD-10-CM | POA: Diagnosis not present

## 2013-08-29 ENCOUNTER — Ambulatory Visit: Payer: Medicare Other | Admitting: Physical Therapy

## 2013-08-29 DIAGNOSIS — Q762 Congenital spondylolisthesis: Secondary | ICD-10-CM | POA: Diagnosis not present

## 2013-08-29 DIAGNOSIS — M545 Low back pain, unspecified: Secondary | ICD-10-CM | POA: Diagnosis not present

## 2013-08-29 DIAGNOSIS — E119 Type 2 diabetes mellitus without complications: Secondary | ICD-10-CM | POA: Diagnosis not present

## 2013-08-29 DIAGNOSIS — R5381 Other malaise: Secondary | ICD-10-CM | POA: Diagnosis not present

## 2013-08-29 DIAGNOSIS — IMO0001 Reserved for inherently not codable concepts without codable children: Secondary | ICD-10-CM | POA: Diagnosis not present

## 2013-08-29 DIAGNOSIS — Z96659 Presence of unspecified artificial knee joint: Secondary | ICD-10-CM | POA: Diagnosis not present

## 2013-09-03 ENCOUNTER — Ambulatory Visit: Payer: Medicare Other | Admitting: Physical Therapy

## 2013-09-03 DIAGNOSIS — R5381 Other malaise: Secondary | ICD-10-CM | POA: Diagnosis not present

## 2013-09-03 DIAGNOSIS — Z96659 Presence of unspecified artificial knee joint: Secondary | ICD-10-CM | POA: Diagnosis not present

## 2013-09-03 DIAGNOSIS — M545 Low back pain, unspecified: Secondary | ICD-10-CM | POA: Diagnosis not present

## 2013-09-03 DIAGNOSIS — E119 Type 2 diabetes mellitus without complications: Secondary | ICD-10-CM | POA: Diagnosis not present

## 2013-09-03 DIAGNOSIS — IMO0001 Reserved for inherently not codable concepts without codable children: Secondary | ICD-10-CM | POA: Diagnosis not present

## 2013-09-03 DIAGNOSIS — Q762 Congenital spondylolisthesis: Secondary | ICD-10-CM | POA: Diagnosis not present

## 2013-09-05 ENCOUNTER — Ambulatory Visit: Payer: Medicare Other | Admitting: *Deleted

## 2013-09-05 DIAGNOSIS — Z96659 Presence of unspecified artificial knee joint: Secondary | ICD-10-CM | POA: Diagnosis not present

## 2013-09-05 DIAGNOSIS — IMO0001 Reserved for inherently not codable concepts without codable children: Secondary | ICD-10-CM | POA: Diagnosis not present

## 2013-09-05 DIAGNOSIS — R5381 Other malaise: Secondary | ICD-10-CM | POA: Diagnosis not present

## 2013-09-05 DIAGNOSIS — M545 Low back pain, unspecified: Secondary | ICD-10-CM | POA: Diagnosis not present

## 2013-09-05 DIAGNOSIS — E119 Type 2 diabetes mellitus without complications: Secondary | ICD-10-CM | POA: Diagnosis not present

## 2013-09-05 DIAGNOSIS — Q762 Congenital spondylolisthesis: Secondary | ICD-10-CM | POA: Diagnosis not present

## 2013-09-12 ENCOUNTER — Ambulatory Visit: Payer: Medicare Other | Attending: Neurological Surgery | Admitting: Physical Therapy

## 2013-09-12 DIAGNOSIS — Q762 Congenital spondylolisthesis: Secondary | ICD-10-CM | POA: Insufficient documentation

## 2013-09-12 DIAGNOSIS — Z96659 Presence of unspecified artificial knee joint: Secondary | ICD-10-CM | POA: Diagnosis not present

## 2013-09-12 DIAGNOSIS — M545 Low back pain, unspecified: Secondary | ICD-10-CM | POA: Insufficient documentation

## 2013-09-12 DIAGNOSIS — R5381 Other malaise: Secondary | ICD-10-CM | POA: Insufficient documentation

## 2013-09-12 DIAGNOSIS — E119 Type 2 diabetes mellitus without complications: Secondary | ICD-10-CM | POA: Insufficient documentation

## 2013-09-12 DIAGNOSIS — IMO0001 Reserved for inherently not codable concepts without codable children: Secondary | ICD-10-CM | POA: Insufficient documentation

## 2013-09-17 DIAGNOSIS — J310 Chronic rhinitis: Secondary | ICD-10-CM | POA: Diagnosis not present

## 2013-09-17 DIAGNOSIS — J309 Allergic rhinitis, unspecified: Secondary | ICD-10-CM | POA: Diagnosis not present

## 2013-09-26 ENCOUNTER — Ambulatory Visit: Payer: Medicare Other | Admitting: *Deleted

## 2013-09-26 DIAGNOSIS — IMO0001 Reserved for inherently not codable concepts without codable children: Secondary | ICD-10-CM | POA: Diagnosis not present

## 2013-10-01 ENCOUNTER — Ambulatory Visit: Payer: Medicare Other | Admitting: Physical Therapy

## 2013-10-01 DIAGNOSIS — M961 Postlaminectomy syndrome, not elsewhere classified: Secondary | ICD-10-CM | POA: Diagnosis not present

## 2013-10-01 DIAGNOSIS — G894 Chronic pain syndrome: Secondary | ICD-10-CM | POA: Diagnosis not present

## 2013-10-01 DIAGNOSIS — IMO0001 Reserved for inherently not codable concepts without codable children: Secondary | ICD-10-CM | POA: Diagnosis not present

## 2013-10-03 ENCOUNTER — Ambulatory Visit: Payer: Medicare Other | Admitting: Physical Therapy

## 2013-10-03 DIAGNOSIS — IMO0001 Reserved for inherently not codable concepts without codable children: Secondary | ICD-10-CM | POA: Diagnosis not present

## 2013-10-07 ENCOUNTER — Telehealth: Payer: Self-pay | Admitting: Neurology

## 2013-10-07 NOTE — Telephone Encounter (Signed)
Last OV note says: Will start primidone 25mg  nightly, can titrate up as indicated.  Patient states he is now taking one full tab (50mg ) daily.  I will be happy to send Rx, just want to clarify that dose is okay to send.  Thank you.

## 2013-10-07 NOTE — Telephone Encounter (Signed)
Patient is calling to get a new Rx for Primidone 50mg --patient takes 1 daily--[facility] [place]--patient is out of medication--thank you.

## 2013-10-08 ENCOUNTER — Ambulatory Visit: Payer: Medicare Other | Admitting: Physical Therapy

## 2013-10-08 MED ORDER — PRIMIDONE 50 MG PO TABS: 50.0000 mg | ORAL_TABLET | Freq: Every day | ORAL | Status: DC

## 2013-10-08 NOTE — Telephone Encounter (Signed)
Rx has been updated and sent.   

## 2013-10-08 NOTE — Telephone Encounter (Signed)
That is fine. Thanks 

## 2013-10-10 ENCOUNTER — Ambulatory Visit: Payer: Medicare Other | Admitting: *Deleted

## 2013-10-10 DIAGNOSIS — IMO0001 Reserved for inherently not codable concepts without codable children: Secondary | ICD-10-CM | POA: Diagnosis not present

## 2013-10-15 ENCOUNTER — Ambulatory Visit: Payer: Medicare Other | Attending: Neurological Surgery | Admitting: Physical Therapy

## 2013-10-15 DIAGNOSIS — Z96659 Presence of unspecified artificial knee joint: Secondary | ICD-10-CM | POA: Insufficient documentation

## 2013-10-15 DIAGNOSIS — M545 Low back pain, unspecified: Secondary | ICD-10-CM | POA: Insufficient documentation

## 2013-10-15 DIAGNOSIS — IMO0001 Reserved for inherently not codable concepts without codable children: Secondary | ICD-10-CM | POA: Diagnosis not present

## 2013-10-15 DIAGNOSIS — E119 Type 2 diabetes mellitus without complications: Secondary | ICD-10-CM | POA: Diagnosis not present

## 2013-10-15 DIAGNOSIS — Q762 Congenital spondylolisthesis: Secondary | ICD-10-CM | POA: Insufficient documentation

## 2013-10-15 DIAGNOSIS — R5381 Other malaise: Secondary | ICD-10-CM | POA: Insufficient documentation

## 2013-10-17 ENCOUNTER — Ambulatory Visit: Payer: Medicare Other | Admitting: Physical Therapy

## 2013-10-17 DIAGNOSIS — IMO0001 Reserved for inherently not codable concepts without codable children: Secondary | ICD-10-CM | POA: Diagnosis not present

## 2013-10-22 ENCOUNTER — Ambulatory Visit: Payer: Medicare Other | Admitting: Physical Therapy

## 2013-10-22 DIAGNOSIS — IMO0001 Reserved for inherently not codable concepts without codable children: Secondary | ICD-10-CM | POA: Diagnosis not present

## 2013-10-24 ENCOUNTER — Ambulatory Visit: Payer: Medicare Other | Admitting: *Deleted

## 2013-10-24 DIAGNOSIS — IMO0001 Reserved for inherently not codable concepts without codable children: Secondary | ICD-10-CM | POA: Diagnosis not present

## 2013-10-28 DIAGNOSIS — Z6832 Body mass index (BMI) 32.0-32.9, adult: Secondary | ICD-10-CM | POA: Diagnosis not present

## 2013-10-28 DIAGNOSIS — Q762 Congenital spondylolisthesis: Secondary | ICD-10-CM | POA: Diagnosis not present

## 2013-10-28 DIAGNOSIS — M549 Dorsalgia, unspecified: Secondary | ICD-10-CM | POA: Diagnosis not present

## 2013-10-29 ENCOUNTER — Ambulatory Visit: Payer: Medicare Other | Admitting: Physical Therapy

## 2013-10-29 DIAGNOSIS — IMO0001 Reserved for inherently not codable concepts without codable children: Secondary | ICD-10-CM | POA: Diagnosis not present

## 2013-10-30 DIAGNOSIS — R5383 Other fatigue: Secondary | ICD-10-CM | POA: Diagnosis not present

## 2013-10-30 DIAGNOSIS — E119 Type 2 diabetes mellitus without complications: Secondary | ICD-10-CM | POA: Diagnosis not present

## 2013-10-30 DIAGNOSIS — R5381 Other malaise: Secondary | ICD-10-CM | POA: Diagnosis not present

## 2013-10-31 ENCOUNTER — Ambulatory Visit: Payer: Medicare Other | Admitting: *Deleted

## 2013-10-31 DIAGNOSIS — IMO0001 Reserved for inherently not codable concepts without codable children: Secondary | ICD-10-CM | POA: Diagnosis not present

## 2013-11-04 ENCOUNTER — Encounter: Payer: Self-pay | Admitting: Pulmonary Disease

## 2013-11-04 ENCOUNTER — Ambulatory Visit (INDEPENDENT_AMBULATORY_CARE_PROVIDER_SITE_OTHER): Payer: Medicare Other | Admitting: Pulmonary Disease

## 2013-11-04 VITALS — BP 130/80 | HR 69 | Temp 98.2°F | Ht 74.0 in | Wt 260.8 lb

## 2013-11-04 DIAGNOSIS — G4733 Obstructive sleep apnea (adult) (pediatric): Secondary | ICD-10-CM

## 2013-11-04 NOTE — Progress Notes (Signed)
   Subjective:    Patient ID: [name]., male    DOB: Dec 18, 1945, 68 y.o.   MRN: [id number]  HPI  patient comes in today for followup of his obstructive sleep apnea. He is wearing CPAP compliantly, and is having no issues with his mask fit or pressure. He is sleeping very well with the device, and has excellent daytime alertness. His weight is stable from the last visit.   Review of Systems  Constitutional: Negative for fever and unexpected weight change.  HENT: Negative for congestion, dental problem, ear pain, nosebleeds, postnasal drip, rhinorrhea, sinus pressure, sneezing, sore throat and trouble swallowing.   Eyes: Negative for redness and itching.  Respiratory: Negative for cough, chest tightness, shortness of breath and wheezing.   Cardiovascular: Negative for palpitations and leg swelling.  Gastrointestinal: Negative for nausea and vomiting.  Genitourinary: Negative for dysuria.  Musculoskeletal: Negative for joint swelling.  Skin: Negative for rash.  Neurological: Negative for headaches.  Hematological: Does not bruise/bleed easily.  Psychiatric/Behavioral: Negative for dysphoric mood. The patient is not nervous/anxious.        Objective:   Physical Exam Overweight male in no acute distress Nose without purulence or discharge noted No skin breakdown or pressure necrosis from the CPAP Neck without lymphadenopathy or thyromegaly Lower extremities without edema, no cyanosis Alert and oriented, moves all 4 extremities.       Assessment & Plan:

## 2013-11-04 NOTE — Patient Instructions (Signed)
Continue with cpap, and keep up with mask changes and supplies. Can check with your insurance formulary, and see if advair/breo/dulera are cheaper for you than symbicort.  Work on weight loss followup with me again in one year.

## 2013-11-04 NOTE — Assessment & Plan Note (Signed)
The patient has done extremely well on CPAP over the last one year, and is totally satisfied with his sleep and daytime alertness. I have encouraged him to work aggressively on weight loss, and to followup with me again in one year.

## 2013-11-05 ENCOUNTER — Ambulatory Visit: Payer: Medicare Other | Admitting: Physical Therapy

## 2013-11-05 DIAGNOSIS — IMO0001 Reserved for inherently not codable concepts without codable children: Secondary | ICD-10-CM | POA: Diagnosis not present

## 2013-11-06 DIAGNOSIS — N400 Enlarged prostate without lower urinary tract symptoms: Secondary | ICD-10-CM | POA: Diagnosis not present

## 2013-11-06 DIAGNOSIS — E119 Type 2 diabetes mellitus without complications: Secondary | ICD-10-CM | POA: Diagnosis not present

## 2013-11-06 DIAGNOSIS — E789 Disorder of lipoprotein metabolism, unspecified: Secondary | ICD-10-CM | POA: Diagnosis not present

## 2013-11-07 ENCOUNTER — Ambulatory Visit: Payer: Medicare Other | Admitting: Physical Therapy

## 2013-11-07 DIAGNOSIS — IMO0001 Reserved for inherently not codable concepts without codable children: Secondary | ICD-10-CM | POA: Diagnosis not present

## 2013-12-09 DIAGNOSIS — E119 Type 2 diabetes mellitus without complications: Secondary | ICD-10-CM | POA: Diagnosis not present

## 2013-12-11 DIAGNOSIS — J019 Acute sinusitis, unspecified: Secondary | ICD-10-CM | POA: Diagnosis not present

## 2013-12-11 DIAGNOSIS — IMO0002 Reserved for concepts with insufficient information to code with codable children: Secondary | ICD-10-CM | POA: Diagnosis not present

## 2013-12-11 DIAGNOSIS — E119 Type 2 diabetes mellitus without complications: Secondary | ICD-10-CM | POA: Diagnosis not present

## 2013-12-16 DIAGNOSIS — M961 Postlaminectomy syndrome, not elsewhere classified: Secondary | ICD-10-CM | POA: Diagnosis not present

## 2013-12-16 DIAGNOSIS — G894 Chronic pain syndrome: Secondary | ICD-10-CM | POA: Diagnosis not present

## 2014-01-02 DIAGNOSIS — F3175 Bipolar disorder, in partial remission, most recent episode depressed: Secondary | ICD-10-CM | POA: Diagnosis not present

## 2014-01-24 DIAGNOSIS — J40 Bronchitis, not specified as acute or chronic: Secondary | ICD-10-CM | POA: Diagnosis not present

## 2014-01-24 DIAGNOSIS — M549 Dorsalgia, unspecified: Secondary | ICD-10-CM | POA: Diagnosis not present

## 2014-01-24 DIAGNOSIS — R0989 Other specified symptoms and signs involving the circulatory and respiratory systems: Secondary | ICD-10-CM | POA: Diagnosis not present

## 2014-01-24 DIAGNOSIS — R0689 Other abnormalities of breathing: Secondary | ICD-10-CM | POA: Diagnosis not present

## 2014-01-24 DIAGNOSIS — R05 Cough: Secondary | ICD-10-CM | POA: Diagnosis not present

## 2014-01-24 DIAGNOSIS — R0602 Shortness of breath: Secondary | ICD-10-CM | POA: Diagnosis not present

## 2014-01-27 DIAGNOSIS — M545 Low back pain: Secondary | ICD-10-CM | POA: Diagnosis not present

## 2014-01-27 DIAGNOSIS — Z6833 Body mass index (BMI) 33.0-33.9, adult: Secondary | ICD-10-CM | POA: Diagnosis not present

## 2014-01-27 DIAGNOSIS — R030 Elevated blood-pressure reading, without diagnosis of hypertension: Secondary | ICD-10-CM | POA: Diagnosis not present

## 2014-02-11 ENCOUNTER — Other Ambulatory Visit: Payer: Self-pay | Admitting: Neurology

## 2014-02-11 NOTE — Telephone Encounter (Signed)
Refill auth via [facility], noted please sched appt with new MD

## 2014-02-24 DIAGNOSIS — E118 Type 2 diabetes mellitus with unspecified complications: Secondary | ICD-10-CM | POA: Diagnosis not present

## 2014-02-28 DIAGNOSIS — J329 Chronic sinusitis, unspecified: Secondary | ICD-10-CM | POA: Diagnosis not present

## 2014-02-28 DIAGNOSIS — G4730 Sleep apnea, unspecified: Secondary | ICD-10-CM | POA: Diagnosis not present

## 2014-02-28 DIAGNOSIS — E118 Type 2 diabetes mellitus with unspecified complications: Secondary | ICD-10-CM | POA: Diagnosis not present

## 2014-03-10 DIAGNOSIS — G894 Chronic pain syndrome: Secondary | ICD-10-CM | POA: Diagnosis not present

## 2014-03-10 DIAGNOSIS — M961 Postlaminectomy syndrome, not elsewhere classified: Secondary | ICD-10-CM | POA: Diagnosis not present

## 2014-03-11 ENCOUNTER — Ambulatory Visit (INDEPENDENT_AMBULATORY_CARE_PROVIDER_SITE_OTHER): Payer: Medicare Other | Admitting: Neurology

## 2014-03-11 ENCOUNTER — Encounter: Payer: Self-pay | Admitting: Neurology

## 2014-03-11 VITALS — BP 126/72 | HR 68 | Ht 74.0 in | Wt 256.0 lb

## 2014-03-11 DIAGNOSIS — G629 Polyneuropathy, unspecified: Secondary | ICD-10-CM

## 2014-03-11 DIAGNOSIS — E1342 Other specified diabetes mellitus with diabetic polyneuropathy: Secondary | ICD-10-CM

## 2014-03-11 DIAGNOSIS — G251 Drug-induced tremor: Secondary | ICD-10-CM

## 2014-03-11 DIAGNOSIS — E1142 Type 2 diabetes mellitus with diabetic polyneuropathy: Secondary | ICD-10-CM

## 2014-03-11 MED ORDER — PRIMIDONE 50 MG PO TABS: 50.0000 mg | ORAL_TABLET | Freq: Two times a day (BID) | ORAL | Status: DC

## 2014-03-11 NOTE — Progress Notes (Signed)
Subjective:   [name]. was seen in consultation in the movement disorder clinic at the request of Dwan Bolt, MD.  The evaluation is for tremor.  Pt is accompanied by his wife who supplements the history.  The patient has previously seen Dr. Krista Blue and Dr. Janann Colonel.   He has seen Dr. Krista Blue since 2011.  Records that were made available to me were reviewed.  The patient has had tremor since approximately 2008.  Pt reports that it initially started just in his fingers but has progressed over time to involve the hands and legs.  It is bilateral.  His tremor has been felt to due to psychiatric medications such as lithium, Depakote (off now), and methylphenidate.  He had been tried on propranolol in the past, but ultimately developed asthma and the propranolol had to be discontinued.  He was then off of tremor medications for many years.  He started seeing Dr. Janann Colonel in June, 2015 for the only visit that he saw him and was placed on primidone.  He is currently on primidone 50 mg daily.  He does think that it helps but has read pt pamphlet and worries it will exacerbate depression.  He can write in cursive now that he has primidone.  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   Affected by caffeine:  No. (1 cup tea only in the AM) Affected by alcohol:  Doesn't drink alcohol Affected by stress:  Yes.   Affected by fatigue:  No. Spills soup if on spoon:  Yes.   Spills glass of liquid if full:  Yes.   Affects ADL's (tying shoes, brushing teeth, etc):  Yes.   (mildly)  Current/Previously tried tremor medications: propranolol (developed asthma and had to d/c); on primidone  Current medications that may exacerbate tremor:  Lithium, albuterol, methylphenidate  Reports paresthesias in the bilateral "quads" and thinks that perhaps due to prior fusion of L5 one year ago.  Has been off balance and walks with torso flexed over legs.    Outside reports reviewed:  historical medical records and referral letter/letters.  Allergies  Allergen Reactions  . Demerol [Meperidine] Itching  . Lamotrigine     welts    Outpatient Encounter Prescriptions as of 03/11/2014  Medication Sig  . [DISCONTINUED] budesonide-formoterol (SYMBICORT) 80-4.5 MCG/ACT inhaler Inhale 2 puffs into the lungs 2 (two) times daily.  Marland Kitchen albuterol (PROVENTIL HFA;VENTOLIN HFA) 108 (90 BASE) MCG/ACT inhaler Inhale 2 puffs into the lungs every 6 (six) hours as needed for wheezing or shortness of breath.  . ALPRAZolam (XANAX) 1 MG tablet Take 0.5 mg by mouth 3 (three) times daily as needed for sleep.  . Dutasteride-Tamsulosin HCl 0.5-0.4 MG CAPS Take 1 capsule by mouth daily before breakfast.  . HYDROcodone-acetaminophen (NORCO) 10-325 MG per tablet Take 1-2 tablets by mouth every 6 (six) hours as needed for moderate pain.  Marland Kitchen lithium carbonate (ESKALITH) 450 MG CR tablet Take 1,350 mg by mouth at bedtime.   . metFORMIN (GLUCOPHAGE) 500 MG tablet Take 1,000 mg by mouth 2 (two) times daily with a meal.   . methylphenidate (CONCERTA) 54 MG CR tablet Take 54 mg by mouth daily before breakfast.  . omeprazole (PRILOSEC) 20 MG capsule Take 20 mg by mouth daily before breakfast.  . primidone (MYSOLINE) 50 MG tablet TAKE 1 TABLET (50 MG TOTAL) BY MOUTH AT BEDTIME.  Marland Kitchen sertraline (ZOLOFT) 100 MG tablet Take 100 mg by mouth daily.  Marland Kitchen  tiZANidine (ZANAFLEX) 2 MG tablet Take 2 mg by mouth every 6 (six) hours as needed for muscle spasms.  . traMADol (ULTRAM) 50 MG tablet Take 50 mg by mouth every 6 (six) hours as needed for moderate pain.  Marland Kitchen zolpidem (AMBIEN) 10 MG tablet Take 10 mg by mouth at bedtime as needed for sleep.    Past Medical History  Diagnosis Date  . Sleep apnea     settings 10.6  / followed by [name] study years ago  . Diabetes mellitus     PCP [name]  . Recurrent upper respiratory infection (URI)     states Dr Wynelle Link aware- no fever- states is improving  . Arthritis     . Depression     ADD  . Tremor     d/t lithium  . Asthma   . Bronchitis     hx of  . Insomnia   . BPH (benign prostatic hyperplasia)   . Complication of anesthesia 4/81/85    no complications but just says he typically needs "more than expected" to anesthetize; needs CPAP (setting 10) in recovery    Past Surgical History  Procedure Laterality Date  . Cardiac catheterization      2008 pre op gastric bypass  . Joint replacement      Left, Right Knee, Right hip  . Knee arthroscopy      right x 3-4, left x 2  . Knee arthrotomy      right  . Gastric bypass  2008  . Polypectomy      vocal cords 1992  . Total hip revision  04/22/2011    Procedure: TOTAL HIP REVISION;  Surgeon: Gearlean Alf, MD;  Location: [facility];  Service: Orthopedics;  Laterality: Right;  . Colonoscopy w/ polypectomy      History   Social History  . Marital Status: Married    Spouse Name: N/A    Number of Children: N/A  . Years of Education: college   Occupational History  . Retired Mathmetician    Social History Main Topics  . Smoking status: Former Smoker -- 1.50 packs/day for 20 years    Types: Cigarettes    Quit date: 04/18/1991  . Smokeless tobacco: Never Used  . Alcohol Use: No  . Drug Use: No  . Sexual Activity: Not on file   Other Topics Concern  . Not on file   Social History Narrative   Patient works for [employer]. Patient has masters degree. Patient is married.    Family Status  Relation Status Death Age  . Father Deceased   . Mother Deceased     Review of Systems Chronic constipation that attributes to back pain.  A complete 10 system ROS was obtained and was negative apart from what is mentioned.   Objective:   VITALS:   Filed Vitals:   03/11/14 0850  BP: 126/72  Pulse: 68  Height: 6\' 2"  (1.88 m)  Weight: 256 lb (116.121 kg)   Gen:  Appears stated age and in NAD. HEENT:  Normocephalic, atraumatic. The mucous membranes are moist. The superficial temporal arteries are  without ropiness or tenderness. Cardiovascular: Regular rate and rhythm. Lungs: Clear to auscultation bilaterally. Neck: There are no carotid bruits noted bilaterally.  NEUROLOGICAL:  Orientation:  The patient is alert and oriented x 3.  Recent and remote memory are intact.  Attention span and concentration are normal.  Able to name objects and repeat without trouble.  Fund of knowledge is appropriate  Cranial nerves: There is good facial symmetry. The pupils are equal round and reactive to light bilaterally. Fundoscopic exam reveals clear disc margins bilaterally. Extraocular muscles are intact and visual fields are full to confrontational testing. Speech is fluent and clear. Soft palate rises symmetrically and there is no tongue deviation. Hearing is intact to conversational tone. Tone: Tone is good throughout. Sensation: Sensation is intact to light touch and pinprick throughout (facial, trunk, extremities). Vibration is absent at the bilateral big toe but intact at the ankle. There is no extinction with double simultaneous stimulation. There is no sensory dermatomal level identified. Coordination:  The patient no dysmetria.  No trouble with hand opening/closing, finger taps, toe taps, heel taps bilaterally.  Minimal trouble with alternation supination/pronation of forearm on the left only. Motor: Strength is 5/5 in the bilateral upper and lower extremities.  Shoulder shrug is equal bilaterally.  There is no pronator drift.  There are no fasciculations noted. DTR's: Deep tendon reflexes are 2/4 at the bilateral biceps, triceps, brachioradialis, patella and trace at the bilateral achilles.  Plantar responses are downgoing bilaterally. Gait and Station: The patient is able to ambulate without difficulty. The patient is able to heel toe walk without any difficulty. The patient has some difficulty ambulating in a tandem fashion.  The patient is able to stand in the Romberg position with eyes open and  closed.   MOVEMENT EXAM: Tremor:  There is minimal tremor in the UE, noted most significantly with action.  The patient is able to draw Archimedes spirals without significant difficulty.  There is irregular tremor at rest that can be felt.  The patient is able to pour water from one glass to another without spilling it.     Lab Results  Component Value Date   HGBA1C * 04/18/2009    8.1 (NOTE) The ADA recommends the following therapeutic goal for glycemic control related to Hgb A1c measurement: Goal of therapy: <6.5 Hgb A1c  Reference: American Diabetes Association: Clinical Practice Recommendations 2010, Diabetes Care, 2010, 33: (Suppl  1).   Lab Results  Component Value Date   NA 141 03/29/2013   BUN 8 03/29/2013   CREATININE 0.81 03/29/2013   WBC 5.6 03/29/2013    Assessment/Plan:   1.   Tremor.  -This is likely due to his lithium and could be exacerbated by methylphenidate and the prn albuterol.  Told pt that I don't think that this is essential tremor, although I guess it could be.  We really wouldn't know while he is on all of these tremor producing medications.  I did tell him that this doesn't mean that he could get off of the lithium, as that could have serious psychiatric consequences.  I did tell him that I thought that primidone was being used, however, to cover up a SE (tremor) that was being caused by lithium.  He would like to increase the primidone to 50 mg bid.  Explained to patient that SE he was reading on internet relate to epilepsy and epilepsy dosages.  Risks, benefits, side effects and alternative therapies were discussed.  The opportunity to ask questions was given and they were answered to the best of my ability.  The patient expressed understanding and willingness to follow the outlined treatment protocols. 2.  Diabetic PN  -think that this is cause of gait instability.   Talked about importance of diabetic control as he is not watching diet.  Talked about  diet/exercise.   3. Follow up in next 4-5  months, sooner should new neuro issues arise.    Much greater than 50% of this visit was spent in counseling with the patient and the family.  Total face to face time:  60 min

## 2014-03-27 DIAGNOSIS — F3175 Bipolar disorder, in partial remission, most recent episode depressed: Secondary | ICD-10-CM | POA: Diagnosis not present

## 2014-04-08 ENCOUNTER — Other Ambulatory Visit: Payer: Self-pay | Admitting: Neurology

## 2014-04-10 ENCOUNTER — Telehealth: Payer: Self-pay | Admitting: Neurology

## 2014-04-10 MED ORDER — PRIMIDONE 50 MG PO TABS: 50.0000 mg | ORAL_TABLET | Freq: Two times a day (BID) | ORAL | Status: DC

## 2014-04-10 NOTE — Telephone Encounter (Signed)
Primidone RX sent to new pharmacy as requested. LMOM making them aware RX sent to new pharmacy as requested.

## 2014-04-10 NOTE — Telephone Encounter (Signed)
[place], pt's wife called wanting to inform us that pt had a new pharmacy, [facility]. Pt needs a refill for Primidone 50mg / 3 months supply. Fax # for [facility] # [phone] C/b [phone]

## 2014-04-15 DIAGNOSIS — Z87891 Personal history of nicotine dependence: Secondary | ICD-10-CM | POA: Diagnosis not present

## 2014-04-15 DIAGNOSIS — J309 Allergic rhinitis, unspecified: Secondary | ICD-10-CM | POA: Diagnosis not present

## 2014-04-15 DIAGNOSIS — Z9884 Bariatric surgery status: Secondary | ICD-10-CM | POA: Diagnosis not present

## 2014-04-15 DIAGNOSIS — J3089 Other allergic rhinitis: Secondary | ICD-10-CM | POA: Diagnosis not present

## 2014-04-15 DIAGNOSIS — J343 Hypertrophy of nasal turbinates: Secondary | ICD-10-CM | POA: Diagnosis not present

## 2014-04-15 DIAGNOSIS — G4733 Obstructive sleep apnea (adult) (pediatric): Secondary | ICD-10-CM | POA: Diagnosis not present

## 2014-04-15 DIAGNOSIS — J342 Deviated nasal septum: Secondary | ICD-10-CM | POA: Diagnosis not present

## 2014-04-15 DIAGNOSIS — J329 Chronic sinusitis, unspecified: Secondary | ICD-10-CM | POA: Diagnosis not present

## 2014-04-15 DIAGNOSIS — Z7982 Long term (current) use of aspirin: Secondary | ICD-10-CM | POA: Diagnosis not present

## 2014-04-24 DIAGNOSIS — E118 Type 2 diabetes mellitus with unspecified complications: Secondary | ICD-10-CM | POA: Diagnosis not present

## 2014-05-01 DIAGNOSIS — K219 Gastro-esophageal reflux disease without esophagitis: Secondary | ICD-10-CM | POA: Diagnosis not present

## 2014-05-01 DIAGNOSIS — E118 Type 2 diabetes mellitus with unspecified complications: Secondary | ICD-10-CM | POA: Diagnosis not present

## 2014-05-01 DIAGNOSIS — N400 Enlarged prostate without lower urinary tract symptoms: Secondary | ICD-10-CM | POA: Diagnosis not present

## 2014-05-21 DIAGNOSIS — J029 Acute pharyngitis, unspecified: Secondary | ICD-10-CM | POA: Diagnosis not present

## 2014-05-21 DIAGNOSIS — E118 Type 2 diabetes mellitus with unspecified complications: Secondary | ICD-10-CM | POA: Diagnosis not present

## 2014-05-21 DIAGNOSIS — N400 Enlarged prostate without lower urinary tract symptoms: Secondary | ICD-10-CM | POA: Diagnosis not present

## 2014-05-26 DIAGNOSIS — M961 Postlaminectomy syndrome, not elsewhere classified: Secondary | ICD-10-CM | POA: Diagnosis not present

## 2014-05-26 DIAGNOSIS — G894 Chronic pain syndrome: Secondary | ICD-10-CM | POA: Diagnosis not present

## 2014-05-29 DIAGNOSIS — Z0000 Encounter for general adult medical examination without abnormal findings: Secondary | ICD-10-CM | POA: Diagnosis not present

## 2014-05-29 DIAGNOSIS — D649 Anemia, unspecified: Secondary | ICD-10-CM | POA: Diagnosis not present

## 2014-05-29 DIAGNOSIS — N400 Enlarged prostate without lower urinary tract symptoms: Secondary | ICD-10-CM | POA: Diagnosis not present

## 2014-05-29 DIAGNOSIS — E118 Type 2 diabetes mellitus with unspecified complications: Secondary | ICD-10-CM | POA: Diagnosis not present

## 2014-05-29 DIAGNOSIS — K219 Gastro-esophageal reflux disease without esophagitis: Secondary | ICD-10-CM | POA: Diagnosis not present

## 2014-06-04 DIAGNOSIS — E118 Type 2 diabetes mellitus with unspecified complications: Secondary | ICD-10-CM | POA: Diagnosis not present

## 2014-06-04 DIAGNOSIS — M549 Dorsalgia, unspecified: Secondary | ICD-10-CM | POA: Diagnosis not present

## 2014-06-04 DIAGNOSIS — N400 Enlarged prostate without lower urinary tract symptoms: Secondary | ICD-10-CM | POA: Diagnosis not present

## 2014-06-05 DIAGNOSIS — J343 Hypertrophy of nasal turbinates: Secondary | ICD-10-CM | POA: Diagnosis not present

## 2014-06-05 DIAGNOSIS — J329 Chronic sinusitis, unspecified: Secondary | ICD-10-CM | POA: Diagnosis not present

## 2014-06-05 DIAGNOSIS — G4733 Obstructive sleep apnea (adult) (pediatric): Secondary | ICD-10-CM | POA: Diagnosis not present

## 2014-06-05 DIAGNOSIS — J309 Allergic rhinitis, unspecified: Secondary | ICD-10-CM | POA: Diagnosis not present

## 2014-06-05 DIAGNOSIS — J3089 Other allergic rhinitis: Secondary | ICD-10-CM | POA: Diagnosis not present

## 2014-06-05 DIAGNOSIS — J324 Chronic pansinusitis: Secondary | ICD-10-CM | POA: Diagnosis not present

## 2014-06-05 DIAGNOSIS — J3489 Other specified disorders of nose and nasal sinuses: Secondary | ICD-10-CM | POA: Diagnosis not present

## 2014-06-05 DIAGNOSIS — Z87891 Personal history of nicotine dependence: Secondary | ICD-10-CM | POA: Diagnosis not present

## 2014-06-05 DIAGNOSIS — Z9884 Bariatric surgery status: Secondary | ICD-10-CM | POA: Diagnosis not present

## 2014-06-05 DIAGNOSIS — J342 Deviated nasal septum: Secondary | ICD-10-CM | POA: Diagnosis not present

## 2014-07-24 DIAGNOSIS — Z86010 Personal history of colonic polyps: Secondary | ICD-10-CM | POA: Diagnosis not present

## 2014-07-24 DIAGNOSIS — K219 Gastro-esophageal reflux disease without esophagitis: Secondary | ICD-10-CM | POA: Diagnosis not present

## 2014-07-24 DIAGNOSIS — K640 First degree hemorrhoids: Secondary | ICD-10-CM | POA: Diagnosis not present

## 2014-07-24 DIAGNOSIS — Z09 Encounter for follow-up examination after completed treatment for conditions other than malignant neoplasm: Secondary | ICD-10-CM | POA: Diagnosis not present

## 2014-07-24 DIAGNOSIS — D126 Benign neoplasm of colon, unspecified: Secondary | ICD-10-CM | POA: Diagnosis not present

## 2014-07-24 DIAGNOSIS — D124 Benign neoplasm of descending colon: Secondary | ICD-10-CM | POA: Diagnosis not present

## 2014-07-24 DIAGNOSIS — Z980 Intestinal bypass and anastomosis status: Secondary | ICD-10-CM | POA: Diagnosis not present

## 2014-07-30 DIAGNOSIS — J343 Hypertrophy of nasal turbinates: Secondary | ICD-10-CM | POA: Diagnosis not present

## 2014-07-30 DIAGNOSIS — F419 Anxiety disorder, unspecified: Secondary | ICD-10-CM | POA: Diagnosis not present

## 2014-07-30 DIAGNOSIS — Z6833 Body mass index (BMI) 33.0-33.9, adult: Secondary | ICD-10-CM | POA: Diagnosis not present

## 2014-07-30 DIAGNOSIS — Z01818 Encounter for other preprocedural examination: Secondary | ICD-10-CM | POA: Diagnosis not present

## 2014-07-30 DIAGNOSIS — E669 Obesity, unspecified: Secondary | ICD-10-CM | POA: Diagnosis not present

## 2014-07-30 DIAGNOSIS — J342 Deviated nasal septum: Secondary | ICD-10-CM | POA: Diagnosis not present

## 2014-07-30 DIAGNOSIS — J324 Chronic pansinusitis: Secondary | ICD-10-CM | POA: Diagnosis not present

## 2014-07-30 DIAGNOSIS — E119 Type 2 diabetes mellitus without complications: Secondary | ICD-10-CM | POA: Diagnosis not present

## 2014-07-30 DIAGNOSIS — G4733 Obstructive sleep apnea (adult) (pediatric): Secondary | ICD-10-CM | POA: Diagnosis not present

## 2014-07-30 DIAGNOSIS — G250 Essential tremor: Secondary | ICD-10-CM | POA: Diagnosis not present

## 2014-07-30 DIAGNOSIS — F319 Bipolar disorder, unspecified: Secondary | ICD-10-CM | POA: Diagnosis not present

## 2014-07-30 DIAGNOSIS — R238 Other skin changes: Secondary | ICD-10-CM | POA: Diagnosis not present

## 2014-07-30 DIAGNOSIS — N400 Enlarged prostate without lower urinary tract symptoms: Secondary | ICD-10-CM | POA: Diagnosis not present

## 2014-07-30 DIAGNOSIS — K219 Gastro-esophageal reflux disease without esophagitis: Secondary | ICD-10-CM | POA: Diagnosis not present

## 2014-07-30 DIAGNOSIS — Z9884 Bariatric surgery status: Secondary | ICD-10-CM | POA: Diagnosis not present

## 2014-07-30 DIAGNOSIS — J45909 Unspecified asthma, uncomplicated: Secondary | ICD-10-CM | POA: Diagnosis not present

## 2014-07-31 DIAGNOSIS — E118 Type 2 diabetes mellitus with unspecified complications: Secondary | ICD-10-CM | POA: Diagnosis not present

## 2014-08-07 DIAGNOSIS — R12 Heartburn: Secondary | ICD-10-CM | POA: Diagnosis not present

## 2014-08-07 DIAGNOSIS — E118 Type 2 diabetes mellitus with unspecified complications: Secondary | ICD-10-CM | POA: Diagnosis not present

## 2014-08-07 DIAGNOSIS — N400 Enlarged prostate without lower urinary tract symptoms: Secondary | ICD-10-CM | POA: Diagnosis not present

## 2014-08-12 ENCOUNTER — Encounter: Payer: Self-pay | Admitting: Neurology

## 2014-08-12 ENCOUNTER — Ambulatory Visit (INDEPENDENT_AMBULATORY_CARE_PROVIDER_SITE_OTHER): Payer: Medicare Other | Admitting: Neurology

## 2014-08-12 VITALS — BP 138/68 | HR 80 | Ht 74.0 in | Wt 257.0 lb

## 2014-08-12 DIAGNOSIS — G629 Polyneuropathy, unspecified: Secondary | ICD-10-CM

## 2014-08-12 DIAGNOSIS — G251 Drug-induced tremor: Secondary | ICD-10-CM

## 2014-08-12 DIAGNOSIS — E1142 Type 2 diabetes mellitus with diabetic polyneuropathy: Secondary | ICD-10-CM

## 2014-08-12 DIAGNOSIS — E1342 Other specified diabetes mellitus with diabetic polyneuropathy: Secondary | ICD-10-CM | POA: Diagnosis not present

## 2014-08-12 MED ORDER — PRIMIDONE 50 MG PO TABS: 50.0000 mg | ORAL_TABLET | Freq: Three times a day (TID) | ORAL | Status: DC

## 2014-08-12 NOTE — Patient Instructions (Signed)
1. Increase Primidone to one tablet in the morning and two tablets at night.

## 2014-08-12 NOTE — Progress Notes (Signed)
Subjective:   [name]. was seen in consultation in the movement disorder clinic at the request of Dwan Bolt, MD.  The evaluation is for tremor.  Pt is accompanied by his wife who supplements the history.  The patient has previously seen Dr. Krista Blue and Dr. Janann Colonel.   He has seen Dr. Krista Blue since 2011.  Records that were made available to me were reviewed.  The patient has had tremor since approximately 2008.  Pt reports that it initially started just in his fingers but has progressed over time to involve the hands and legs.  It is bilateral.  His tremor has been felt to due to psychiatric medications such as lithium, Depakote (off now), and methylphenidate.  He had been tried on propranolol in the past, but ultimately developed asthma and the propranolol had to be discontinued.  He was then off of tremor medications for many years.  He started seeing Dr. Janann Colonel in June, 2015 for the only visit that he saw him and was placed on primidone.  He is currently on primidone 50 mg daily.  He does think that it helps but has read pt pamphlet and worries it will exacerbate depression.  He can write in cursive now that he has primidone.  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   08/12/14 update:  Pt is present today for f/u.  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  Is on primidone for tremor control and this was increased to 50 mg bid last visit.  Pt states that the increase did help, especially the amplitude of the tremor but he states that perhaps it hasn't changed the frequency of the tremor.  "My cursive signature is far better."  He states that he has cut down on his lithium and thinks that his mood is the same.  His psychiatrist left and he has to change care. Pt wants off of the lithium and thinks that he doesn't need it.  He continues to worry about the fact his old head injuries contribute to tremor, along with loss of memory.  His wife is  currently in [place] for 3 weeks but his wifes sister is checking on him.  He worries about that.    Current/Previously tried tremor medications: propranolol (developed asthma and had to d/c); on primidone  Current medications that may exacerbate tremor:  Lithium, albuterol, methylphenidate  Outside reports reviewed: historical medical records and referral letter/letters.  Allergies  Allergen Reactions  . Demerol [Meperidine] Itching  . Lamotrigine     welts    Outpatient Encounter Prescriptions as of 08/12/2014  Medication Sig  . albuterol (PROVENTIL HFA;VENTOLIN HFA) 108 (90 BASE) MCG/ACT inhaler Inhale 2 puffs into the lungs every 6 (six) hours as needed for wheezing or shortness of breath.  . ALPRAZolam (XANAX) 1 MG tablet Take 0.5 mg by mouth 3 (three) times daily as needed for sleep.  Marland Kitchen dutasteride (AVODART) 0.5 MG capsule Take 0.5 mg by mouth daily.  . Dutasteride-Tamsulosin HCl 0.5-0.4 MG CAPS Take 1 capsule by mouth daily before breakfast.  . glyBURIDE-metformin (GLUCOVANCE) 2.5-500 MG per tablet Take 1 tablet by mouth daily with breakfast.  . HYDROcodone-acetaminophen (NORCO) 10-325 MG per tablet Take 1-2 tablets by mouth every 6 (six) hours as needed for moderate pain.  Marland Kitchen lithium carbonate (ESKALITH) 450 MG CR tablet Take 1,350 mg by mouth at bedtime.   . methocarbamol (ROBAXIN) 500 MG tablet Take 500 mg by  mouth as needed.   . methylphenidate (CONCERTA) 54 MG CR tablet Take 54 mg by mouth daily before breakfast.  . omeprazole (PRILOSEC) 20 MG capsule Take 20 mg by mouth daily before breakfast.  . pioglitazone (ACTOS) 45 MG tablet Take 45 mg by mouth daily.   . primidone (MYSOLINE) 50 MG tablet Take 1 tablet (50 mg total) by mouth 2 (two) times daily.  . sertraline (ZOLOFT) 100 MG tablet Take 100 mg by mouth daily.  . [DISCONTINUED] metFORMIN (GLUCOPHAGE) 500 MG tablet Take 1,000 mg by mouth 2 (two) times daily with a meal.   . [DISCONTINUED] metFORMIN (GLUCOPHAGE) 500 MG  tablet   . [DISCONTINUED] tiZANidine (ZANAFLEX) 2 MG tablet Take 2 mg by mouth every 6 (six) hours as needed for muscle spasms.   No facility-administered encounter medications on file as of 08/12/2014.    Past Medical History  Diagnosis Date  . Sleep apnea     settings 10.6  / followed by [name] study years ago  . Diabetes mellitus     PCP [name]  . Recurrent upper respiratory infection (URI)     states Dr Wynelle Link aware- no fever- states is improving  . Arthritis   . Depression     ADD  . Tremor     d/t lithium  . Asthma   . Bronchitis     hx of  . Insomnia   . BPH (benign prostatic hyperplasia)   . Complication of anesthesia 09/10/50    no complications but just says he typically needs "more than expected" to anesthetize; needs CPAP (setting 10) in recovery    Past Surgical History  Procedure Laterality Date  . Cardiac catheterization      2008 pre op gastric bypass  . Joint replacement      Left, Right Knee, Right hip  . Knee arthroscopy      right x 3-4, left x 2  . Knee arthrotomy      right  . Gastric bypass  2008  . Polypectomy      vocal cords 1992  . Total hip revision  04/22/2011    Procedure: TOTAL HIP REVISION;  Surgeon: Gearlean Alf, MD;  Location: [facility];  Service: Orthopedics;  Laterality: Right;  . Colonoscopy w/ polypectomy    . Lumbar fusion  04/03/2013    [name]    History   Social History  . Marital Status: Married    Spouse Name: N/A  . Number of Children: N/A  . Years of Education: college   Occupational History  . Retired Mathmetician    Social History Main Topics  . Smoking status: Former Smoker -- 1.50 packs/day for 20 years    Types: Cigarettes    Quit date: 04/18/1991  . Smokeless tobacco: Never Used  . Alcohol Use: No  . Drug Use: No  . Sexual Activity: Not on file   Other Topics Concern  . Not on file   Social History Narrative   Patient works for [employer]. Patient has masters degree. Patient is married.     Family Status  Relation Status Death Age  . Father Deceased     heart disease  . Mother Deceased     chronic depression    Review of Systems   A complete 10 system ROS was obtained and was negative apart from what is mentioned.   Objective:   VITALS:   Filed Vitals:   08/12/14 0940  BP: 138/68  Pulse: 80  Height: 6\' 2"  (1.88 m)  Weight: 257 lb (116.574 kg)   Gen:  Appears stated age and in NAD. HEENT:  Normocephalic, atraumatic. The mucous membranes are moist. The superficial temporal arteries are without ropiness or tenderness. Cardiovascular: Regular rate and rhythm. Lungs: Clear to auscultation bilaterally. Neck: There are no carotid bruits noted bilaterally.  NEUROLOGICAL:  Orientation:  The patient is alert and oriented x 3.  He is verbose and has trouble focusing and concentrating on the topic at hand. Cranial nerves: There is good facial symmetry. Extraocular muscles are intact and visual fields are full to confrontational testing. Speech is fluent and clear. Soft palate rises symmetrically and there is no tongue deviation. Hearing is intact to conversational tone. Tone: Tone is good throughout. Sensation: Sensation is intact to light touch throughout. Coordination:  The patient no dysmetria.  No trouble with hand opening/closing, finger taps, toe taps, heel taps bilaterally.  Minimal trouble with alternation supination/pronation of forearm on the left only. Motor: Strength is 5/5 in the bilateral upper and lower extremities.  Shoulder shrug is equal bilaterally.  There is no pronator drift.  There are no fasciculations noted.   MOVEMENT EXAM: Tremor:  There is minimal tremor in the UE, noted most significantly with action.  The patient is able to draw Archimedes spirals without significant difficulty.  There is irregular tremor at rest that can be felt.  The patient is able to pour water from one glass to another without spilling it.     Lab Results  Component  Value Date   HGBA1C * 04/18/2009    8.1 (NOTE) The ADA recommends the following therapeutic goal for glycemic control related to Hgb A1c measurement: Goal of therapy: <6.5 Hgb A1c  Reference: American Diabetes Association: Clinical Practice Recommendations 2010, Diabetes Care, 2010, 33: (Suppl  1).   Lab Results  Component Value Date   NA 141 03/29/2013   BUN 8 03/29/2013   CREATININE 0.81 03/29/2013   WBC 5.6 03/29/2013    Assessment/Plan:   1.   Tremor.  -This is likely due to his lithium and could be exacerbated by methylphenidate and the prn albuterol.  Told pt that I don't think that this is essential tremor, although I guess it could be.  We really wouldn't know while he is on all of these tremor producing medications.  I did tell him that this doesn't mean that he could get off of the lithium, as that could have serious psychiatric consequences.  He doesn't think that he needs it but I will leave that to psychiatry discretion.  I reiterated he did not have PD and there is no evidence that tremor is from old head injury.   I did tell him that I thought that primidone was being used, however, to cover up a SE (tremor) that was being caused by lithium.  He would like to increase the primidone again to 50 mg in the AM and increase to 100mg  at night.    Risks, benefits, side effects and alternative therapies were discussed.  The opportunity to ask questions was given and they were answered to the best of my ability.  The patient expressed understanding and willingness to follow the outlined treatment protocols. 2.  Diabetic PN  - Talked about importance of diabetic control as he is not watching diet.  Talked about diet/exercise.   3. Follow up in next 6-8 months, sooner should new neuro issues arise.    Much greater than 50% of this visit  was spent in counseling with the patient.  Total face to face time:  35 min

## 2014-08-18 DIAGNOSIS — M961 Postlaminectomy syndrome, not elsewhere classified: Secondary | ICD-10-CM | POA: Diagnosis not present

## 2014-08-18 DIAGNOSIS — G894 Chronic pain syndrome: Secondary | ICD-10-CM | POA: Diagnosis not present

## 2014-09-10 DIAGNOSIS — M25552 Pain in left hip: Secondary | ICD-10-CM | POA: Diagnosis not present

## 2014-09-11 DIAGNOSIS — F3130 Bipolar disorder, current episode depressed, mild or moderate severity, unspecified: Secondary | ICD-10-CM | POA: Diagnosis not present

## 2014-09-22 DIAGNOSIS — Z01818 Encounter for other preprocedural examination: Secondary | ICD-10-CM | POA: Diagnosis not present

## 2014-09-22 DIAGNOSIS — E118 Type 2 diabetes mellitus with unspecified complications: Secondary | ICD-10-CM | POA: Diagnosis not present

## 2014-10-13 NOTE — H&P (Signed)
TOTAL HIP ADMISSION H&P  Patient is admitted for left total hip arthroplasty, anterior approach.  Subjective:  Chief Complaint:     Left hip primary OA / pain  HPI: Ronald Pavlov., 69 y.o. male, has a history of pain and functional disability in the left hip(s) due to arthritis and patient has failed non-surgical conservative treatments for greater than 12 weeks to include NSAID's and/or analgesics and activity modification.  Onset of symptoms was gradual starting 1+ years ago with gradually worsening course since that time.The patient noted no past surgery on the left hip(s).  Patient currently rates pain in the left hip at 9 out of 10 with activity. Patient has night pain, worsening of pain with activity and weight bearing, trendelenberg gait, pain that interfers with activities of daily living and pain with passive range of motion. Patient has evidence of periarticular osteophytes and joint space narrowing by imaging studies. This condition presents safety issues increasing the risk of falls.   There is no current active infection.  Risks, benefits and expectations were discussed with the patient.  Risks including but not limited to the risk of anesthesia, blood clots, nerve damage, blood vessel damage, failure of the prosthesis, infection and up to and including death.  Patient understand the risks, benefits and expectations and wishes to proceed with surgery.    PCP: Dwan Bolt, MD  D/C Plans:      Home with HHPT  Post-op Meds:       No Rx given  Tranexamic Acid:      To be given - IV   Decadron:      Is to be given  FYI:     ASA post-op  Oxy 5-15 post-op  CPAP  NO HHPT,  NO DME needs    Patient Active Problem List   Diagnosis Date Noted  . S/P lumbar spinal fusion 04/03/2013  . Sinusitis, chronic 12/20/2012  . Abnormality of gait 10/31/2012  . Tremor 10/31/2012  . Cough 10/14/2012  . Extrinsic asthma, unspecified 09/27/2012  . Hip pain, right 04/22/2011  .  OBSTRUCTIVE SLEEP APNEA 12/26/2006   Past Medical History  Diagnosis Date  . Sleep apnea     settings 10.6  / followed by [name] study years ago  . Diabetes mellitus     PCP [name]  . Recurrent upper respiratory infection (URI)     states Dr Wynelle Link aware- no fever- states is improving  . Arthritis   . Depression     ADD  . Tremor     d/t lithium  . Asthma   . Bronchitis     hx of  . Insomnia   . BPH (benign prostatic hyperplasia)   . Complication of anesthesia 11/04/21    no complications but just says he typically needs "more than expected" to anesthetize; needs CPAP (setting 10) in recovery    Past Surgical History  Procedure Laterality Date  . Cardiac catheterization      2008 pre op gastric bypass  . Joint replacement      Left, Right Knee, Right hip  . Knee arthroscopy      right x 3-4, left x 2  . Knee arthrotomy      right  . Gastric bypass  2008  . Polypectomy      vocal cords 1992  . Total hip revision  04/22/2011    Procedure: TOTAL HIP REVISION;  Surgeon: Gearlean Alf, MD;  Location: [facility];  Service: Orthopedics;  Laterality: Right;  . Colonoscopy w/ polypectomy    . Lumbar fusion  04/03/2013    [name]    No prescriptions prior to admission   Allergies  Allergen Reactions  . Demerol [Meperidine] Itching  . Lamotrigine     welts    History  Substance Use Topics  . Smoking status: Former Smoker -- 1.50 packs/day for 20 years    Types: Cigarettes    Quit date: 04/18/1991  . Smokeless tobacco: Never Used  . Alcohol Use: No    Family History  Problem Relation Age of Onset  . Asthma Maternal Grandmother   . Heart disease Father   . Anxiety disorder Mother      Review of Systems  Constitutional: Positive for malaise/fatigue.  HENT: Positive for tinnitus.   Eyes: Negative.   Respiratory: Positive for shortness of breath (on exertion).   Cardiovascular: Negative.   Gastrointestinal: Positive for heartburn.  Genitourinary:  Negative.   Musculoskeletal: Positive for back pain and joint pain.  Skin: Negative.   Neurological: Negative.   Endo/Heme/Allergies: Negative.   Psychiatric/Behavioral: Positive for depression. The patient is nervous/anxious and has insomnia.     Objective:  Physical Exam  Constitutional: He is oriented to person, place, and time. He appears well-developed.  HENT:  Head: Normocephalic and atraumatic.  Eyes: Pupils are equal, round, and reactive to light.  Neck: Neck supple. No JVD present. No tracheal deviation present. No thyromegaly present.  Cardiovascular: Normal rate, regular rhythm, normal heart sounds and intact distal pulses.   Respiratory: Effort normal and breath sounds normal. No stridor. No respiratory distress. He has no wheezes.  GI: Soft. There is no tenderness. There is no guarding.  Musculoskeletal:       Left hip: He exhibits decreased range of motion, decreased strength, tenderness and bony tenderness. He exhibits no swelling, no deformity and no laceration.  Lymphadenopathy:    He has no cervical adenopathy.  Neurological: He is alert and oriented to person, place, and time.  Skin: Skin is warm and dry.  Psychiatric: He has a normal mood and affect.      Labs:  Estimated body mass index is 32.98 kg/(m^2) as calculated from the following:   Height as of 08/12/14: 6\' 2"  (1.88 m).   Weight as of 08/12/14: 116.574 kg (257 lb).   Imaging Review Plain radiographs demonstrate severe degenerative joint disease of the left hip(s). The bone quality appears to be good for age and reported activity level.  Assessment/Plan:  End stage arthritis, left hip(s)  The patient history, physical examination, clinical judgement of the provider and imaging studies are consistent with end stage degenerative joint disease of the left hip(s) and total hip arthroplasty is deemed medically necessary. The treatment options including medical management, injection therapy, arthroscopy  and arthroplasty were discussed at length. The risks and benefits of total hip arthroplasty were presented and reviewed. The risks due to aseptic loosening, infection, stiffness, dislocation/subluxation,  thromboembolic complications and other imponderables were discussed.  The patient acknowledged the explanation, agreed to proceed with the plan and consent was signed. Patient is being admitted for inpatient treatment for surgery, pain control, PT, OT, prophylactic antibiotics, VTE prophylaxis, progressive ambulation and ADL's and discharge planning.The patient is planning to be discharged home with home health services.      West Pugh Jacier Gladu   PA-C  10/13/2014, 12:55 PM

## 2014-10-14 ENCOUNTER — Encounter (HOSPITAL_COMMUNITY): Payer: Self-pay

## 2014-10-14 ENCOUNTER — Encounter (HOSPITAL_COMMUNITY)
Admission: RE | Admit: 2014-10-14 | Discharge: 2014-10-14 | Disposition: A | Discharge status: Home / Self Care | Payer: Medicare Other | Source: Ambulatory Visit | Attending: Orthopedic Surgery | Admitting: Orthopedic Surgery

## 2014-10-14 DIAGNOSIS — Z01812 Encounter for preprocedural laboratory examination: Secondary | ICD-10-CM | POA: Insufficient documentation

## 2014-10-14 DIAGNOSIS — M79662 Pain in left lower leg: Secondary | ICD-10-CM | POA: Diagnosis not present

## 2014-10-14 DIAGNOSIS — Z01810 Encounter for preprocedural cardiovascular examination: Secondary | ICD-10-CM | POA: Insufficient documentation

## 2014-10-14 DIAGNOSIS — M1612 Unilateral primary osteoarthritis, left hip: Secondary | ICD-10-CM | POA: Diagnosis not present

## 2014-10-14 HISTORY — DX: Other cervical disc degeneration, unspecified cervical region: M50.30

## 2014-10-14 HISTORY — DX: Other cervical disc displacement, unspecified cervical region: M50.20

## 2014-10-14 LAB — APTT: APTT: 28 s (ref 24–37)

## 2014-10-14 LAB — PROTIME-INR
INR: 1.07 (ref 0.00–1.49)
PROTHROMBIN TIME: 14.1 s (ref 11.6–15.2)

## 2014-10-14 LAB — BASIC METABOLIC PANEL
Anion gap: 7 (ref 5–15)
BUN: 16 mg/dL (ref 6–20)
CALCIUM: 8.9 mg/dL (ref 8.9–10.3)
CHLORIDE: 106 mmol/L (ref 101–111)
CO2: 25 mmol/L (ref 22–32)
Creatinine, Ser: 0.84 mg/dL (ref 0.61–1.24)
GFR calc non Af Amer: >60 mL/min (ref >60)
Glucose, Bld: 121 mg/dL — ABNORMAL HIGH (ref 65–99)
POTASSIUM: 4.6 mmol/L (ref 3.5–5.1)
Sodium: 138 mmol/L (ref 135–145)

## 2014-10-14 LAB — URINALYSIS, ROUTINE W REFLEX MICROSCOPIC
BILIRUBIN URINE: NEGATIVE
GLUCOSE, UA: 500 mg/dL — AB
Ketones, ur: NEGATIVE mg/dL
NITRITE: NEGATIVE
Protein, ur: 30 mg/dL — AB
SPECIFIC GRAVITY, URINE: 1.023 (ref 1.005–1.030)
Urobilinogen, UA: 0.2 mg/dL (ref 0.0–1.0)
pH: 5 (ref 5.0–8.0)

## 2014-10-14 LAB — CBC
HEMATOCRIT: 37.3 % — AB (ref 39.0–52.0)
Hemoglobin: 12.3 g/dL — ABNORMAL LOW (ref 13.0–17.0)
MCH: 28.4 pg (ref 26.0–34.0)
MCHC: 33 g/dL (ref 30.0–36.0)
MCV: 86.1 fL (ref 78.0–100.0)
Platelets: 265 10*3/uL (ref 150–400)
RBC: 4.33 MIL/uL (ref 4.22–5.81)
RDW: 14.1 % (ref 11.5–15.5)
WBC: 8.6 10*3/uL (ref 4.0–10.5)

## 2014-10-14 LAB — URINE MICROSCOPIC-ADD ON

## 2014-10-14 LAB — SURGICAL PCR SCREEN
MRSA, PCR: NEGATIVE
STAPHYLOCOCCUS AUREUS: POSITIVE — AB

## 2014-10-14 NOTE — Progress Notes (Signed)
Positive staph results faxed to Dr. [name] via [facility]. Pt called and informed of results and treatment plan.  Mupirocin ointment called into [facility] in [place].  No questions or concerns.

## 2014-10-14 NOTE — Patient Instructions (Addendum)
[name].  10/14/2014   Your procedure is scheduled on: Tuesday 10/21/14  Report to [facility] Main  Entrance take [facility]  elevators to 3rd floor to  [place] at 06:00 AM.  Call this number if you have problems the morning of surgery [phone]   Remember: Please bring inhalers and CPAP mask and tubing  ONLY 1 PERSON MAY GO WITH YOU TO SHORT STAY TO GET  READY MORNING OF YOUR SURGERY.  Do not eat food or drink liquids :After Midnight.   Take these medicines the morning of surgery with A SIP OF WATER: inhalers, xanax if needed, omeprazole, zoloft, mysoline                               You may not have any metal on your body including hair pins and              piercings  Do not wear jewelry, make-up, lotions, powders or perfumes, deodorant             Do not wear nail polish.  Do not shave  48 hours prior to surgery.              Men may shave face and neck.  Do not bring valuables to the hospital. [place].  Contacts, dentures or bridgework may not be worn into surgery.  Leave suitcase in the car. After surgery it may be brought to your room.              Please read over the following fact sheets you were given: MRSA information  _____________________________________________________________________           [facility] - Preparing for Surgery Before surgery, you can play an important role.  Because skin is not sterile, your skin needs to be as free of germs as possible.  You can reduce the number of germs on your skin by washing with CHG (chlorahexidine gluconate) soap before surgery.  CHG is an antiseptic cleaner which kills germs and bonds with the skin to continue killing germs even after washing. Please DO NOT use if you have an allergy to CHG or antibacterial soaps.  If your skin becomes reddened/irritated stop using the CHG and inform your nurse when you arrive at Short Stay. Do not shave  (including legs and underarms) for at least 48 hours prior to the first CHG shower.  You may shave your face/neck. Please follow these instructions carefully:  1.  Shower with CHG Soap the night before surgery and the  morning of Surgery.  2.  If you choose to wash your hair, wash your hair first as usual with your  normal  shampoo.  3.  After you shampoo, rinse your hair and body thoroughly to remove the  shampoo.                            4.  Use CHG as you would any other liquid soap.  You can apply chg directly  to the skin and wash                       Gently with a scrungie or  clean washcloth.  5.  Apply the CHG Soap to your body ONLY FROM THE NECK DOWN.   Do not use on face/ open                           Wound or open sores. Avoid contact with eyes, ears mouth and genitals (private parts).                       Wash face,  Genitals (private parts) with your normal soap.             6.  Wash thoroughly, paying special attention to the area where your surgery  will be performed.  7.  Thoroughly rinse your body with warm water from the neck down.  8.  DO NOT shower/wash with your normal soap after using and rinsing off  the CHG Soap.                9.  Pat yourself dry with a clean towel.            10.  Wear clean pajamas.            11.  Place clean sheets on your bed the night of your first shower and do not  sleep with pets. Day of Surgery : Do not apply any lotions/deodorants the morning of surgery.  Please wear clean clothes to the hospital/surgery center.  FAILURE TO FOLLOW THESE INSTRUCTIONS MAY RESULT IN THE CANCELLATION OF YOUR SURGERY PATIENT SIGNATURE_________________________________  NURSE SIGNATURE__________________________________  ________________________________________________________________________  WHAT IS A BLOOD TRANSFUSION? Blood Transfusion Information  A transfusion is the replacement of blood or some of its parts. Blood is made up of multiple cells which  provide different functions.  Red blood cells carry oxygen and are used for blood loss replacement.  White blood cells fight against infection.  Platelets control bleeding.  Plasma helps clot blood.  Other blood products are available for specialized needs, such as hemophilia or other clotting disorders. BEFORE THE TRANSFUSION  Who gives blood for transfusions?   Healthy volunteers who are fully evaluated to make sure their blood is safe. This is blood bank blood. Transfusion therapy is the safest it has ever been in the practice of medicine. Before blood is taken from a donor, a complete history is taken to make sure that person has no history of diseases nor engages in risky social behavior (examples are intravenous drug use or sexual activity with multiple partners). The donor's travel history is screened to minimize risk of transmitting infections, such as malaria. The donated blood is tested for signs of infectious diseases, such as HIV and hepatitis. The blood is then tested to be sure it is compatible with you in order to minimize the chance of a transfusion reaction. If you or a relative donates blood, this is often done in anticipation of surgery and is not appropriate for emergency situations. It takes many days to process the donated blood. RISKS AND COMPLICATIONS Although transfusion therapy is very safe and saves many lives, the main dangers of transfusion include:  1. Getting an infectious disease. 2. Developing a transfusion reaction. This is an allergic reaction to something in the blood you were given. Every precaution is taken to prevent this. The decision to have a blood transfusion has been considered carefully by your caregiver before blood is given. Blood is not given unless the benefits outweigh the risks. AFTER THE TRANSFUSION  Right after receiving a blood transfusion, you will usually feel much better and more energetic. This is especially true if your red blood cells  have gotten low (anemic). The transfusion raises the level of the red blood cells which carry oxygen, and this usually causes an energy increase.  The nurse administering the transfusion will monitor you carefully for complications. HOME CARE INSTRUCTIONS  No special instructions are needed after a transfusion. You may find your energy is better. Speak with your caregiver about any limitations on activity for underlying diseases you may have. SEEK MEDICAL CARE IF:   Your condition is not improving after your transfusion.  You develop redness or irritation at the intravenous (IV) site. SEEK IMMEDIATE MEDICAL CARE IF:  Any of the following symptoms occur over the next 12 hours:  Shaking chills.  You have a temperature by mouth above 102 F (38.9 C), not controlled by medicine.  Chest, back, or muscle pain.  People around you feel you are not acting correctly or are confused.  Shortness of breath or difficulty breathing.  Dizziness and fainting.  You get a rash or develop hives.  You have a decrease in urine output.  Your urine turns a dark color or changes to pink, red, or brown. Any of the following symptoms occur over the next 10 days:  You have a temperature by mouth above 102 F (38.9 C), not controlled by medicine.  Shortness of breath.  Weakness after normal activity.  The white part of the eye turns yellow (jaundice).  You have a decrease in the amount of urine or are urinating less often.  Your urine turns a dark color or changes to pink, red, or brown. Document Released: 02/26/2000 Document Revised: 05/23/2011 Document Reviewed: 10/15/2007 ExitCare Patient Information 2014 [place].  _______________________________________________________________________  Incentive Spirometer  An incentive spirometer is a tool that can help keep your lungs clear and active. This tool measures how well you are filling your lungs with each breath. Taking long deep  breaths may help reverse or decrease the chance of developing breathing (pulmonary) problems (especially infection) following:  A long period of time when you are unable to move or be active. BEFORE THE PROCEDURE   If the spirometer includes an indicator to show your best effort, your nurse or respiratory therapist will set it to a desired goal.  If possible, sit up straight or lean slightly forward. Try not to slouch.  Hold the incentive spirometer in an upright position. INSTRUCTIONS FOR USE  3. Sit on the edge of your bed if possible, or sit up as far as you can in bed or on a chair. 4. Hold the incentive spirometer in an upright position. 5. Breathe out normally. 6. Place the mouthpiece in your mouth and seal your lips tightly around it. 7. Breathe in slowly and as deeply as possible, raising the piston or the ball toward the top of the column. 8. Hold your breath for 3-5 seconds or for as long as possible. Allow the piston or ball to fall to the bottom of the column. 9. Remove the mouthpiece from your mouth and breathe out normally. 10. Rest for a few seconds and repeat Steps 1 through 7 at least 10 times every 1-2 hours when you are awake. Take your time and take a few normal breaths between deep breaths. 11. The spirometer may include an indicator to show your best effort. Use the indicator as a goal to work toward during each repetition. 12. After  each set of 10 deep breaths, practice coughing to be sure your lungs are clear. If you have an incision (the cut made at the time of surgery), support your incision when coughing by placing a pillow or rolled up towels firmly against it. Once you are able to get out of bed, walk around indoors and cough well. You may stop using the incentive spirometer when instructed by your caregiver.  RISKS AND COMPLICATIONS  Take your time so you do not get dizzy or light-headed.  If you are in pain, you may need to take or ask for pain medication  before doing incentive spirometry. It is harder to take a deep breath if you are having pain. AFTER USE  Rest and breathe slowly and easily.  It can be helpful to keep track of a log of your progress. Your caregiver can provide you with a simple table to help with this. If you are using the spirometer at home, follow these instructions: [place] IF:   You are having difficultly using the spirometer.  You have trouble using the spirometer as often as instructed.  Your pain medication is not giving enough relief while using the spirometer.  You develop fever of 100.5 F (38.1 C) or higher. SEEK IMMEDIATE MEDICAL CARE IF:   You cough up bloody sputum that had not been present before.  You develop fever of 102 F (38.9 C) or greater.  You develop worsening pain at or near the incision site. MAKE SURE YOU:   Understand these instructions.  Will watch your condition.  Will get help right away if you are not doing well or get worse. Document Released: 07/11/2006 Document Revised: 05/23/2011 Document Reviewed: 09/11/2006 [facility] Patient Information 2014 [place], [place].   ________________________________________________________________________

## 2014-10-14 NOTE — Progress Notes (Signed)
Chest x-ray 01/24/14 on chart, surgery clearance note 09/22/14 Dr. Wilson Singer on chart

## 2014-10-20 ENCOUNTER — Encounter (HOSPITAL_COMMUNITY): Payer: Self-pay | Admitting: Anesthesiology

## 2014-10-20 MED ORDER — CEFAZOLIN SODIUM 10 G IJ SOLR
3.0000 g | INTRAMUSCULAR | Status: AC
  Administered 2014-10-21: 3 g via INTRAVENOUS
  Filled 2014-10-20: qty 3000

## 2014-10-20 NOTE — Anesthesia Preprocedure Evaluation (Addendum)
Anesthesia Evaluation  Patient identified by MRN, date of birth, ID band Patient awake    Reviewed: Allergy & Precautions, NPO status , Patient's Chart, lab work & pertinent test results  History of Anesthesia Complications (+) history of anesthetic complications  Airway Mallampati: II  TM Distance: >3 FB Neck ROM: Full   Comment: Grade 1 laryngoscopic view with the past two anesthetics. Dental no notable dental hx.    Pulmonary asthma , sleep apnea , Recent URI , former smoker,  breath sounds clear to auscultation  Pulmonary exam normal       Cardiovascular negative cardio ROS Normal cardiovascular examRhythm:Regular Rate:Normal     Neuro/Psych PSYCHIATRIC DISORDERS Depression negative neurological ROS     GI/Hepatic negative GI ROS, Neg liver ROS,   Endo/Other  diabetes, Type 2, Oral Hypoglycemic Agents  Renal/GU negative Renal ROS  negative genitourinary   Musculoskeletal  (+) Arthritis -,   Abdominal (+) + obese,   Peds negative pediatric ROS (+)  Hematology negative hematology ROS (+)   Anesthesia Other Findings   Reproductive/Obstetrics negative OB ROS                          Anesthesia Physical Anesthesia Plan  ASA: III  Anesthesia Plan: Spinal   Post-op Pain Management:    Induction: Intravenous  Airway Management Planned:   Additional Equipment:   Intra-op Plan:   Post-operative Plan: Extubation in OR  Informed Consent: I have reviewed the patients History and Physical, chart, labs and discussed the procedure including the risks, benefits and alternatives for the proposed anesthesia with the patient or authorized representative who has indicated his/her understanding and acceptance.   Dental advisory given  Plan Discussed with: CRNA  Anesthesia Plan Comments: (Discussed risks and benefits of and differences between spinal and general. Discussed risks of spinal  including headache, backache, failure, bleeding and hematoma, infection, and nerve damage and paralysis. Patient consents to spinal. Questions answered. Coagulation studies and platelet count acceptable.   Spinal with GA backup. Patient consents.)       Anesthesia Quick Evaluation

## 2014-10-21 ENCOUNTER — Inpatient Hospital Stay (HOSPITAL_COMMUNITY): Payer: Medicare Other

## 2014-10-21 ENCOUNTER — Inpatient Hospital Stay (HOSPITAL_COMMUNITY)
Admission: RE | Admit: 2014-10-21 | Discharge: 2014-10-22 | DRG: 470 | Disposition: A | Discharge status: Home Health | Payer: Medicare Other | Source: Ambulatory Visit | Attending: Orthopedic Surgery | Admitting: Orthopedic Surgery

## 2014-10-21 ENCOUNTER — Inpatient Hospital Stay (HOSPITAL_COMMUNITY): Payer: Medicare Other | Admitting: Anesthesiology

## 2014-10-21 ENCOUNTER — Encounter (HOSPITAL_COMMUNITY): Payer: Self-pay | Admitting: Certified Registered Nurse Anesthetist

## 2014-10-21 ENCOUNTER — Encounter (HOSPITAL_COMMUNITY)
Admission: RE | Disposition: A | Discharge status: Home Health | Payer: Self-pay | Source: Ambulatory Visit | Attending: Orthopedic Surgery

## 2014-10-21 DIAGNOSIS — M1612 Unilateral primary osteoarthritis, left hip: Secondary | ICD-10-CM | POA: Diagnosis not present

## 2014-10-21 DIAGNOSIS — G4733 Obstructive sleep apnea (adult) (pediatric): Secondary | ICD-10-CM | POA: Diagnosis not present

## 2014-10-21 DIAGNOSIS — J45909 Unspecified asthma, uncomplicated: Secondary | ICD-10-CM | POA: Diagnosis present

## 2014-10-21 DIAGNOSIS — E669 Obesity, unspecified: Secondary | ICD-10-CM | POA: Diagnosis present

## 2014-10-21 DIAGNOSIS — Z471 Aftercare following joint replacement surgery: Secondary | ICD-10-CM | POA: Diagnosis not present

## 2014-10-21 DIAGNOSIS — Z87891 Personal history of nicotine dependence: Secondary | ICD-10-CM | POA: Diagnosis not present

## 2014-10-21 DIAGNOSIS — Z981 Arthrodesis status: Secondary | ICD-10-CM

## 2014-10-21 DIAGNOSIS — Z96641 Presence of right artificial hip joint: Secondary | ICD-10-CM | POA: Diagnosis present

## 2014-10-21 DIAGNOSIS — Z96649 Presence of unspecified artificial hip joint: Secondary | ICD-10-CM

## 2014-10-21 DIAGNOSIS — E119 Type 2 diabetes mellitus without complications: Secondary | ICD-10-CM | POA: Diagnosis present

## 2014-10-21 DIAGNOSIS — Z96642 Presence of left artificial hip joint: Secondary | ICD-10-CM | POA: Diagnosis not present

## 2014-10-21 DIAGNOSIS — Z6834 Body mass index (BMI) 34.0-34.9, adult: Secondary | ICD-10-CM | POA: Diagnosis not present

## 2014-10-21 DIAGNOSIS — Z9884 Bariatric surgery status: Secondary | ICD-10-CM

## 2014-10-21 DIAGNOSIS — Z01812 Encounter for preprocedural laboratory examination: Secondary | ICD-10-CM

## 2014-10-21 DIAGNOSIS — M169 Osteoarthritis of hip, unspecified: Secondary | ICD-10-CM | POA: Diagnosis not present

## 2014-10-21 DIAGNOSIS — M25552 Pain in left hip: Secondary | ICD-10-CM | POA: Diagnosis not present

## 2014-10-21 DIAGNOSIS — J45998 Other asthma: Secondary | ICD-10-CM | POA: Diagnosis not present

## 2014-10-21 HISTORY — PX: TOTAL HIP ARTHROPLASTY: SHX124

## 2014-10-21 LAB — GLUCOSE, CAPILLARY
GLUCOSE-CAPILLARY: 189 mg/dL — AB (ref 65–99)
GLUCOSE-CAPILLARY: 224 mg/dL — AB (ref 65–99)
GLUCOSE-CAPILLARY: 184 mg/dL — AB (ref 65–99)
Glucose-Capillary: 128 mg/dL — ABNORMAL HIGH (ref 65–99)

## 2014-10-21 LAB — TYPE AND SCREEN
ABO/RH(D): A POS
Antibody Screen: NEGATIVE

## 2014-10-21 SURGERY — ARTHROPLASTY, HIP, TOTAL, ANTERIOR APPROACH
Anesthesia: Spinal | Site: Hip | Laterality: Left

## 2014-10-21 MED ORDER — PROPOFOL 500 MG/50ML IV EMUL
INTRAVENOUS | Status: DC | PRN
  Administered 2014-10-21: 30 mg via INTRAVENOUS

## 2014-10-21 MED ORDER — METFORMIN HCL 500 MG PO TABS
1000.0000 mg | ORAL_TABLET | Freq: Two times a day (BID) | ORAL | Status: DC
  Administered 2014-10-21 – 2014-10-22 (×2): 1000 mg via ORAL
  Filled 2014-10-21 (×4): qty 2

## 2014-10-21 MED ORDER — METOCLOPRAMIDE HCL 5 MG/ML IJ SOLN: 5.0000 mg | Freq: Three times a day (TID) | INTRAMUSCULAR | Status: DC | PRN

## 2014-10-21 MED ORDER — OMEPRAZOLE 20 MG PO CPDR
20.0000 mg | DELAYED_RELEASE_CAPSULE | Freq: Two times a day (BID) | ORAL | Status: DC
  Administered 2014-10-21 – 2014-10-22 (×2): 20 mg via ORAL
  Filled 2014-10-21 (×5): qty 1

## 2014-10-21 MED ORDER — EPHEDRINE SULFATE 50 MG/ML IJ SOLN
INTRAMUSCULAR | Status: DC | PRN
  Administered 2014-10-21 (×4): 10 mg via INTRAVENOUS

## 2014-10-21 MED ORDER — LACTATED RINGERS IV SOLN
INTRAVENOUS | Status: DC
  Administered 2014-10-21 (×2): via INTRAVENOUS
  Administered 2014-10-21: 1000 mL via INTRAVENOUS

## 2014-10-21 MED ORDER — HYDROMORPHONE HCL 1 MG/ML IJ SOLN
INTRAMUSCULAR | Status: AC
  Administered 2014-10-21: 1 mg via INTRAVENOUS
  Filled 2014-10-21: qty 1

## 2014-10-21 MED ORDER — MIDAZOLAM HCL 2 MG/2ML IJ SOLN
INTRAMUSCULAR | Status: AC
  Filled 2014-10-21: qty 4

## 2014-10-21 MED ORDER — OXYCODONE HCL 5 MG PO TABS
5.0000 mg | ORAL_TABLET | ORAL | Status: DC
  Administered 2014-10-21: 5 mg via ORAL
  Administered 2014-10-21 (×2): 15 mg via ORAL
  Administered 2014-10-21: 10 mg via ORAL
  Administered 2014-10-22 (×2): 15 mg via ORAL
  Filled 2014-10-21 (×3): qty 3
  Filled 2014-10-21: qty 1
  Filled 2014-10-21: qty 3
  Filled 2014-10-21: qty 2

## 2014-10-21 MED ORDER — NON FORMULARY: 20.0000 mg | Freq: Two times a day (BID) | Status: DC

## 2014-10-21 MED ORDER — SODIUM CHLORIDE 0.9 % IJ SOLN
INTRAMUSCULAR | Status: AC
  Filled 2014-10-21: qty 50

## 2014-10-21 MED ORDER — PROPOFOL 10 MG/ML IV BOLUS
INTRAVENOUS | Status: AC
  Filled 2014-10-21: qty 20

## 2014-10-21 MED ORDER — SODIUM CHLORIDE 0.9 % IR SOLN
Status: DC | PRN
  Administered 2014-10-21: 1000 mL

## 2014-10-21 MED ORDER — PHENOL 1.4 % MT LIQD: 1.0000 | OROMUCOSAL | Status: DC | PRN

## 2014-10-21 MED ORDER — ONDANSETRON HCL 4 MG PO TABS
4.0000 mg | ORAL_TABLET | Freq: Four times a day (QID) | ORAL | Status: DC | PRN
  Administered 2014-10-22: 4 mg via ORAL
  Filled 2014-10-21: qty 1

## 2014-10-21 MED ORDER — DUTASTERIDE 0.5 MG PO CAPS
0.5000 mg | ORAL_CAPSULE | Freq: Every day | ORAL | Status: DC
  Administered 2014-10-22: 0.5 mg via ORAL
  Filled 2014-10-21: qty 1

## 2014-10-21 MED ORDER — FENTANYL CITRATE (PF) 100 MCG/2ML IJ SOLN
INTRAMUSCULAR | Status: DC | PRN
  Administered 2014-10-21: 50 ug via INTRAVENOUS
  Administered 2014-10-21 (×3): 100 ug via INTRAVENOUS

## 2014-10-21 MED ORDER — DIPHENHYDRAMINE HCL 25 MG PO CAPS: 25.0000 mg | ORAL_CAPSULE | Freq: Four times a day (QID) | ORAL | Status: DC | PRN

## 2014-10-21 MED ORDER — ASPIRIN EC 325 MG PO TBEC: 325.0000 mg | DELAYED_RELEASE_TABLET | Freq: Two times a day (BID) | ORAL | Status: AC

## 2014-10-21 MED ORDER — ALPRAZOLAM 0.5 MG PO TABS
0.5000 mg | ORAL_TABLET | Freq: Three times a day (TID) | ORAL | Status: DC | PRN
Start: 2014-10-21 — End: 2014-10-22
  Administered 2014-10-21 – 2014-10-22 (×3): 0.5 mg via ORAL
  Filled 2014-10-21 (×3): qty 1

## 2014-10-21 MED ORDER — BUPIVACAINE-EPINEPHRINE (PF) 0.25% -1:200000 IJ SOLN
INTRAMUSCULAR | Status: AC
  Filled 2014-10-21: qty 30

## 2014-10-21 MED ORDER — FENTANYL CITRATE (PF) 100 MCG/2ML IJ SOLN
INTRAMUSCULAR | Status: AC
  Filled 2014-10-21: qty 4

## 2014-10-21 MED ORDER — DEXAMETHASONE SODIUM PHOSPHATE 10 MG/ML IJ SOLN
10.0000 mg | Freq: Once | INTRAMUSCULAR | Status: AC
  Administered 2014-10-22: 10 mg via INTRAVENOUS
  Filled 2014-10-21: qty 1

## 2014-10-21 MED ORDER — DOCUSATE SODIUM 100 MG PO CAPS
100.0000 mg | ORAL_CAPSULE | Freq: Two times a day (BID) | ORAL | Status: DC
  Administered 2014-10-21 – 2014-10-22 (×3): 100 mg via ORAL

## 2014-10-21 MED ORDER — LITHIUM CARBONATE ER 450 MG PO TBCR
450.0000 mg | EXTENDED_RELEASE_TABLET | Freq: Every day | ORAL | Status: DC
  Administered 2014-10-21: 450 mg via ORAL
  Filled 2014-10-21 (×2): qty 1

## 2014-10-21 MED ORDER — PRIMIDONE 50 MG PO TABS
50.0000 mg | ORAL_TABLET | Freq: Three times a day (TID) | ORAL | Status: DC
  Administered 2014-10-21 – 2014-10-22 (×3): 50 mg via ORAL
  Filled 2014-10-21 (×7): qty 1

## 2014-10-21 MED ORDER — METOCLOPRAMIDE HCL 10 MG PO TABS: 5.0000 mg | ORAL_TABLET | Freq: Three times a day (TID) | ORAL | Status: DC | PRN

## 2014-10-21 MED ORDER — KETOROLAC TROMETHAMINE 30 MG/ML IJ SOLN
INTRAMUSCULAR | Status: AC
  Filled 2014-10-21: qty 1

## 2014-10-21 MED ORDER — HYDROMORPHONE HCL 1 MG/ML IJ SOLN
0.2500 mg | INTRAMUSCULAR | Status: DC | PRN
  Administered 2014-10-21 (×4): 0.5 mg via INTRAVENOUS

## 2014-10-21 MED ORDER — TIZANIDINE HCL 4 MG PO TABS: 4.0000 mg | ORAL_TABLET | Freq: Four times a day (QID) | ORAL | Status: DC | PRN

## 2014-10-21 MED ORDER — METHOCARBAMOL 500 MG PO TABS
500.0000 mg | ORAL_TABLET | Freq: Four times a day (QID) | ORAL | Status: DC | PRN
  Administered 2014-10-22 (×2): 500 mg via ORAL
  Filled 2014-10-21 (×2): qty 1

## 2014-10-21 MED ORDER — ALUM & MAG HYDROXIDE-SIMETH 200-200-20 MG/5ML PO SUSP: 30.0000 mL | ORAL | Status: DC | PRN

## 2014-10-21 MED ORDER — SODIUM CHLORIDE 0.9 % IV SOLN
1000.0000 mg | Freq: Once | INTRAVENOUS | Status: AC
  Administered 2014-10-21: 1000 mg via INTRAVENOUS
  Filled 2014-10-21: qty 10

## 2014-10-21 MED ORDER — FERROUS SULFATE 325 (65 FE) MG PO TABS
325.0000 mg | ORAL_TABLET | Freq: Three times a day (TID) | ORAL | Status: DC
  Administered 2014-10-21 – 2014-10-22 (×2): 325 mg via ORAL
  Filled 2014-10-21 (×5): qty 1

## 2014-10-21 MED ORDER — PIOGLITAZONE HCL 45 MG PO TABS
45.0000 mg | ORAL_TABLET | Freq: Every day | ORAL | Status: DC
  Administered 2014-10-21 – 2014-10-22 (×2): 45 mg via ORAL
  Filled 2014-10-21 (×2): qty 1

## 2014-10-21 MED ORDER — MENTHOL 3 MG MT LOZG: 1.0000 | LOZENGE | OROMUCOSAL | Status: DC | PRN

## 2014-10-21 MED ORDER — INSULIN ASPART 100 UNIT/ML ~~LOC~~ SOLN: 0.0000 [IU] | Freq: Three times a day (TID) | SUBCUTANEOUS | Status: DC

## 2014-10-21 MED ORDER — MIDAZOLAM HCL 5 MG/5ML IJ SOLN
INTRAMUSCULAR | Status: DC | PRN
  Administered 2014-10-21 (×2): 2 mg via INTRAVENOUS

## 2014-10-21 MED ORDER — SODIUM CHLORIDE 0.9 % IV SOLN
100.0000 mL/h | INTRAVENOUS | Status: DC
  Administered 2014-10-21 (×2): 100 mL/h via INTRAVENOUS
  Filled 2014-10-21 (×9): qty 1000

## 2014-10-21 MED ORDER — CELECOXIB 200 MG PO CAPS
200.0000 mg | ORAL_CAPSULE | Freq: Two times a day (BID) | ORAL | Status: DC
  Administered 2014-10-21 (×2): 200 mg via ORAL
  Filled 2014-10-21 (×5): qty 1

## 2014-10-21 MED ORDER — ALBUTEROL SULFATE (2.5 MG/3ML) 0.083% IN NEBU: 2.5000 mg | INHALATION_SOLUTION | Freq: Four times a day (QID) | RESPIRATORY_TRACT | Status: DC | PRN

## 2014-10-21 MED ORDER — HYDROMORPHONE HCL 1 MG/ML IJ SOLN
0.5000 mg | INTRAMUSCULAR | Status: DC | PRN
  Administered 2014-10-21 – 2014-10-22 (×4): 1 mg via INTRAVENOUS
  Filled 2014-10-21 (×3): qty 1

## 2014-10-21 MED ORDER — SERTRALINE HCL 100 MG PO TABS
200.0000 mg | ORAL_TABLET | Freq: Every day | ORAL | Status: DC
  Administered 2014-10-22: 200 mg via ORAL
  Filled 2014-10-21: qty 2

## 2014-10-21 MED ORDER — ASPIRIN EC 325 MG PO TBEC
325.0000 mg | DELAYED_RELEASE_TABLET | Freq: Two times a day (BID) | ORAL | Status: DC
  Filled 2014-10-21 (×3): qty 1

## 2014-10-21 MED ORDER — CEFAZOLIN SODIUM-DEXTROSE 2-3 GM-% IV SOLR
2.0000 g | Freq: Four times a day (QID) | INTRAVENOUS | Status: AC
  Administered 2014-10-21 (×2): 2 g via INTRAVENOUS
  Filled 2014-10-21 (×2): qty 50

## 2014-10-21 MED ORDER — METHOCARBAMOL 1000 MG/10ML IJ SOLN
500.0000 mg | Freq: Four times a day (QID) | INTRAVENOUS | Status: DC | PRN
  Administered 2014-10-21: 500 mg via INTRAVENOUS
  Filled 2014-10-21 (×2): qty 5

## 2014-10-21 MED ORDER — BUDESONIDE-FORMOTEROL FUMARATE 160-4.5 MCG/ACT IN AERO
1.0000 | INHALATION_SPRAY | Freq: Two times a day (BID) | RESPIRATORY_TRACT | Status: DC
  Administered 2014-10-21 – 2014-10-22 (×2): 1 via RESPIRATORY_TRACT
  Filled 2014-10-21: qty 6

## 2014-10-21 MED ORDER — ONDANSETRON HCL 4 MG/2ML IJ SOLN: 4.0000 mg | Freq: Four times a day (QID) | INTRAMUSCULAR | Status: DC | PRN

## 2014-10-21 MED ORDER — BUPIVACAINE HCL (PF) 0.5 % IJ SOLN
INTRAMUSCULAR | Status: DC | PRN
  Administered 2014-10-21: 3 mL

## 2014-10-21 MED ORDER — DEXAMETHASONE SODIUM PHOSPHATE 10 MG/ML IJ SOLN
10.0000 mg | Freq: Once | INTRAMUSCULAR | Status: AC
  Administered 2014-10-21: 10 mg via INTRAVENOUS

## 2014-10-21 MED ORDER — OXYCODONE HCL 5 MG PO TABS: 5.0000 mg | ORAL_TABLET | ORAL | Status: DC | PRN

## 2014-10-21 MED ORDER — PROPOFOL INFUSION 10 MG/ML OPTIME
INTRAVENOUS | Status: DC | PRN
  Administered 2014-10-21: 100 ug/kg/min via INTRAVENOUS

## 2014-10-21 MED ORDER — MAGNESIUM CITRATE PO SOLN: 1.0000 | Freq: Once | ORAL | Status: DC | PRN

## 2014-10-21 MED ORDER — POLYETHYLENE GLYCOL 3350 17 G PO PACK
17.0000 g | PACK | Freq: Two times a day (BID) | ORAL | Status: DC
Start: 2014-10-21 — End: 2014-10-22
  Administered 2014-10-21 – 2014-10-22 (×2): 17 g via ORAL

## 2014-10-21 MED ORDER — FENTANYL CITRATE (PF) 250 MCG/5ML IJ SOLN
INTRAMUSCULAR | Status: AC
  Filled 2014-10-21: qty 25

## 2014-10-21 MED ORDER — TAMSULOSIN HCL 0.4 MG PO CAPS
0.4000 mg | ORAL_CAPSULE | Freq: Every day | ORAL | Status: DC
  Administered 2014-10-22: 0.4 mg via ORAL
  Filled 2014-10-21 (×2): qty 1

## 2014-10-21 MED ORDER — BISACODYL 10 MG RE SUPP: 10.0000 mg | Freq: Every day | RECTAL | Status: DC | PRN

## 2014-10-21 SURGICAL SUPPLY — 54 items
BAG DECANTER FOR FLEXI CONT (MISCELLANEOUS) IMPLANT
BAG SPEC THK2 15X12 ZIP CLS (MISCELLANEOUS) ×1
BAG ZIPLOCK 12X15 (MISCELLANEOUS) ×2 IMPLANT
CAPT HIP TOTAL 2 ×1 IMPLANT
COVER PERINEAL POST (MISCELLANEOUS) ×2 IMPLANT
DRAPE C-ARM 42X120 X-RAY (DRAPES) ×2 IMPLANT
DRAPE STERI IOBAN 125X83 (DRAPES) ×2 IMPLANT
DRAPE U-SHAPE 47X51 STRL (DRAPES) ×6 IMPLANT
DRSG AQUACEL AG ADV 3.5X10 (GAUZE/BANDAGES/DRESSINGS) ×2 IMPLANT
DURAPREP 26ML APPLICATOR (WOUND CARE) ×2 IMPLANT
ELECT BLADE TIP CTD 4 INCH (ELECTRODE) ×2 IMPLANT
ELECT PENCIL ROCKER SW 15FT (MISCELLANEOUS) ×1 IMPLANT
ELECT REM PT RETURN 15FT ADLT (MISCELLANEOUS) ×1 IMPLANT
ELECT REM PT RETURN 9FT ADLT (ELECTROSURGICAL) ×2
ELECTRODE REM PT RTRN 9FT ADLT (ELECTROSURGICAL) ×1 IMPLANT
FACESHIELD WRAPAROUND (MASK) ×8 IMPLANT
GLOVE BIOGEL M STRL SZ7.5 (GLOVE) ×1 IMPLANT
GLOVE BIOGEL PI IND STRL 6.5 (GLOVE) IMPLANT
GLOVE BIOGEL PI IND STRL 7.0 (GLOVE) IMPLANT
GLOVE BIOGEL PI IND STRL 7.5 (GLOVE) ×1 IMPLANT
GLOVE BIOGEL PI IND STRL 8.5 (GLOVE) ×1 IMPLANT
GLOVE BIOGEL PI INDICATOR 6.5 (GLOVE) ×1
GLOVE BIOGEL PI INDICATOR 7.0 (GLOVE) ×1
GLOVE BIOGEL PI INDICATOR 7.5 (GLOVE) ×3
GLOVE BIOGEL PI INDICATOR 8.5 (GLOVE)
GLOVE ECLIPSE 8.0 STRL XLNG CF (GLOVE) ×2 IMPLANT
GLOVE ORTHO TXT STRL SZ7.5 (GLOVE) ×2 IMPLANT
GLOVE SURG SS PI 6.5 STRL IVOR (GLOVE) ×1 IMPLANT
GLOVE SURG SS PI 7.5 STRL IVOR (GLOVE) ×1 IMPLANT
GOWN SPEC L3 XXLG W/TWL (GOWN DISPOSABLE) ×1 IMPLANT
GOWN STRL REUS W/ TWL XL LVL3 (GOWN DISPOSABLE) IMPLANT
GOWN STRL REUS W/TWL 2XL LVL3 (GOWN DISPOSABLE) ×1 IMPLANT
GOWN STRL REUS W/TWL LRG LVL3 (GOWN DISPOSABLE) ×2 IMPLANT
GOWN STRL REUS W/TWL XL LVL3 (GOWN DISPOSABLE) ×4
HOLDER FOLEY CATH W/STRAP (MISCELLANEOUS) ×2 IMPLANT
KIT BASIN OR (CUSTOM PROCEDURE TRAY) ×2 IMPLANT
LIQUID BAND (GAUZE/BANDAGES/DRESSINGS) ×2 IMPLANT
NDL SAFETY ECLIPSE 18X1.5 (NEEDLE) IMPLANT
NEEDLE HYPO 18GX1.5 SHARP (NEEDLE)
PACK TOTAL JOINT (CUSTOM PROCEDURE TRAY) ×2 IMPLANT
PEN SKIN MARKING BROAD (MISCELLANEOUS) ×2 IMPLANT
SAW OSC TIP CART 19.5X105X1.3 (SAW) ×2 IMPLANT
SUT MNCRL AB 4-0 PS2 18 (SUTURE) ×2 IMPLANT
SUT VIC AB 1 CT1 36 (SUTURE) ×6 IMPLANT
SUT VIC AB 2-0 CT1 27 (SUTURE) ×4
SUT VIC AB 2-0 CT1 TAPERPNT 27 (SUTURE) ×2 IMPLANT
SUT VLOC 180 0 24IN GS25 (SUTURE) ×2 IMPLANT
SYR 50ML LL SCALE MARK (SYRINGE) IMPLANT
TOWEL OR 17X26 10 PK STRL BLUE (TOWEL DISPOSABLE) ×2 IMPLANT
TOWEL OR NON WOVEN STRL DISP B (DISPOSABLE) ×2 IMPLANT
TRAY FOLEY W/METER SILVER 14FR (SET/KITS/TRAYS/PACK) ×1 IMPLANT
TRAY FOLEY W/METER SILVER 16FR (SET/KITS/TRAYS/PACK) ×2 IMPLANT
WATER STERILE IRR 1500ML POUR (IV SOLUTION) ×2 IMPLANT
YANKAUER SUCT BULB TIP 10FT TU (MISCELLANEOUS) ×2 IMPLANT

## 2014-10-21 NOTE — Op Note (Signed)
NAME:  [name].                ACCOUNT NO.: [id number]      MEDICAL RECORD NO.: [id number]      FACILITY:  [facility]      PHYSICIAN:  [name] D  DATE OF BIRTH:  [birth year]     DATE OF PROCEDURE:  10/21/2014                                 OPERATIVE REPORT         PREOPERATIVE DIAGNOSIS: Left  hip osteoarthritis.      POSTOPERATIVE DIAGNOSIS:  Left hip osteoarthritis, status post right total hip replacement     PROCEDURE:  Left total hip replacement through an anterior approach   utilizing DePuy THR system, component size 40mm pinnacle cup, a size 36+4 neutral   Altrex liner, a size 10Hi Tri Lock stem with a 36+8.5 delta ceramic   ball.      SURGEON:  Pietro Cassis. [name], M.D.      ASSISTANT:  Nehemiah Massed, PA-C     ANESTHESIA:  Spinal.      SPECIMENS:  None.      COMPLICATIONS:  None.      BLOOD LOSS:  300 cc     DRAINS:  None.      INDICATION OF THE PROCEDURE:  [name]. is a 69 y.o. male who had   presented to office for evaluation of left hip pain.  Radiographs revealed   progressive degenerative changes with bone-on-bone   articulation to the  hip joint.  He had his right hip replaced in past and is noted to be longer on the right side radiographically comparing the lesser trochanters.  The patient had painful limited range of   motion significantly affecting their overall quality of life.  The patient was failing to    respond to conservative measures, and at this point was ready   to proceed with more definitive measures.  The patient has noted progressive   degenerative changes in his hip, progressive problems and dysfunction   with regarding the hip prior to surgery.  Consent was obtained for   benefit of pain relief.  Specific risk of infection, DVT, component   failure, dislocation, need for revision surgery, as well discussion of   the anterior versus posterior approach were reviewed.  Consent was   obtained for  benefit of anterior pain relief through an anterior   approach.      PROCEDURE IN DETAIL:  The patient was brought to operative theater.   Once adequate anesthesia, preoperative antibiotics, 2gm of Ancef, 1 gm of Tranexamic Acid, and 10 mg of Decadron administered.   The patient was positioned supine on the OSI [place]anna table.  Once adequate   padding of boney process was carried out, we had predraped out the hip, and  used fluoroscopy to confirm orientation of the pelvis and position.      The left hip was then prepped and draped from proximal iliac crest to   mid thigh with shower curtain technique.      Time-out was performed identifying the patient, planned procedure, and   extremity.     An incision was then made 2 cm distal and lateral to the   anterior superior iliac spine extending over the orientation of the   tensor fascia lata  muscle and sharp dissection was carried down to the   fascia of the muscle and protractor placed in the soft tissues.      The fascia was then incised.  The muscle belly was identified and swept   laterally and retractor placed along the superior neck.  Following   cauterization of the circumflex vessels and removing some pericapsular   fat, a second cobra retractor was placed on the inferior neck.  A third   retractor was placed on the anterior acetabulum after elevating the   anterior rectus.  A L-capsulotomy was along the line of the   superior neck to the trochanteric fossa, then extended proximally and   distally.  Tag sutures were placed and the retractors were then placed   intracapsular.  We then identified the trochanteric fossa and   orientation of my neck cut, confirmed this radiographically   and then made a neck osteotomy with the femur on traction.  The femoral   head was removed without difficulty or complication.  Traction was let   off and retractors were placed posterior and anterior around the   acetabulum.      The labrum and  foveal tissue were debrided.  I began reaming with a 32mm   reamer and reamed up to 14mm reamer with good bony bed preparation and a 29mm   cup was chosen.  The final 102mm Pinnacle cup was then impacted under fluoroscopy  to confirm the depth of penetration and orientation with respect to   abduction.  A screw was placed followed by the hole eliminator.  The final   36+4 neutral Altrex liner was impacted with good visualized rim fit.  The cup was positioned anatomically within the acetabular portion of the pelvis.      At this point, the femur was rolled at 80 degrees.  Further capsule was   released off the inferior aspect of the femoral neck.  I then   released the superior capsule proximally.  The hook was placed laterally   along the femur and elevated manually and held in position with the bed   hook.  The leg was then extended and adducted with the leg rolled to 100   degrees of external rotation.  Once the proximal femur was fully   exposed, I used a box osteotome to set orientation.  I then began   broaching with the starting chili pepper broach and passed this by hand and then broached up to 10.  With the 10 broach in place I chose a high offset neck and did several trial reductions.  The offset was appropriate, leg lengths   appeared to be equal, confirmed radiographically.  I feel I was able to restore his leg lengths and offset to match the contralateral hip.  Given these findings, I went ahead and dislocated the hip, repositioned all   retractors and positioned the right hip in the extended and abducted position.  The final 10 Hi Tri Lock stem was   chosen and it was impacted down to the level of neck cut.  Based on this   and the trial reduction, a 36+8.5 delta ceramic ball was chosen and   impacted onto a clean and dry trunnion, and the hip was reduced.  The   hip had been irrigated throughout the case again at this point.  I did   reapproximate the superior capsular leaflet to  the anterior leaflet   using #1 Vicryl.  The fascia of  the   tensor fascia lata muscle was then reapproximated using #1 Vicryl and #0 V-lock sutures.  The   remaining wound was closed with 2-0 Vicryl and running 4-0 Monocryl.   The hip was cleaned, dried, and dressed sterilely using Dermabond and   Aquacel dressing.  He was then brought   to recovery room in stable condition tolerating the procedure well.    Nehemiah Massed, PA-C was present for the entirety of the case involved from   preoperative positioning, perioperative retractor management, general   facilitation of the case, as well as primary wound closure as assistant.            Pietro Cassis [name], M.D.        10/21/2014 10:14 AM

## 2014-10-21 NOTE — Anesthesia Postprocedure Evaluation (Signed)
  Anesthesia Post-op Note  Patient: [name].  Procedure(s) Performed: Procedure(s) (LRB): LEFT TOTAL HIP ARTHROPLASTY ANTERIOR APPROACH (Left)  Patient Location: PACU  Anesthesia Type: Spinal  Level of Consciousness: awake and alert   Airway and Oxygen Therapy: Patient Spontanous Breathing  Post-op Pain: mild  Post-op Assessment: Post-op Vital signs reviewed, Patient's Cardiovascular Status Stable, Respiratory Function Stable, Patent Airway and No signs of Nausea or vomiting. Spinal is receding.  Last Vitals:  Filed Vitals:   10/21/14 1151  BP: 116/56  Pulse: 65  Temp: 36.4 C  Resp: 15    Post-op Vital Signs: stable   Complications: No apparent anesthesia complications

## 2014-10-21 NOTE — Transfer of Care (Signed)
Immediate Anesthesia Transfer of Care Note  Patient: [name].  Procedure(s) Performed: Procedure(s): LEFT TOTAL HIP ARTHROPLASTY ANTERIOR APPROACH (Left)  Patient Location: PACU  Anesthesia Type:Spinal  Level of Consciousness:  sedated, patient cooperative and responds to stimulation  Airway & Oxygen Therapy:Patient Spontanous Breathing and Patient connected to face mask oxgen  Post-op Assessment:  Report given to PACU RN and Post -op Vital signs reviewed and stable  Post vital signs:  Reviewed and stable  Last Vitals:  Filed Vitals:   10/21/14 0617  BP: 138/76  Pulse: 62  Temp: 36.6 C  Resp: 18    Complications: No apparent anesthesia complications

## 2014-10-21 NOTE — Discharge Instructions (Signed)

## 2014-10-21 NOTE — Progress Notes (Signed)
Utilization review completed.  

## 2014-10-21 NOTE — Anesthesia Procedure Notes (Signed)
Spinal  Start time: 10/21/2014 8:45 AM End time: 10/21/2014 8:51 AM Staffing Anesthesiologist: Franne Grip Performed by: anesthesiologist  Preanesthetic Checklist Completed: patient identified, site marked, surgical consent, pre-op evaluation, timeout performed, IV checked, risks and benefits discussed and monitors and equipment checked Spinal Block Patient position: sitting Prep: Betadine Patient monitoring: heart rate, continuous pulse ox and blood pressure Approach: midline Location: L2-3 Injection technique: single-shot Needle Needle type: Sprotte  Needle gauge: 22 G Needle length: 9 cm Needle insertion depth: 8 cm Additional Notes Pt in sitting position tolerated well without parasthesia.

## 2014-10-21 NOTE — Progress Notes (Signed)
RT inspected pt's home CPAP machine for frayed or damaged wires, none are apparent.  Pt's machine is in proper working order.  Pt stated that he will self administer CPAP when ready for bed.  RT to monitor and assess as needed.

## 2014-10-21 NOTE — Plan of Care (Signed)
Problem: Consults Goal: Diagnosis- Total Joint Replacement Outcome: Completed/Met Date Met:  10/21/14 Primary Total Hip     

## 2014-10-21 NOTE — Evaluation (Signed)
Physical Therapy Evaluation Patient Details Name: [name]. MRN: [id number] DOB: 1945-12-25 Today's Date: 10/21/2014   History of Present Illness  Pt is a 69 year old male s/p L THA direct anterior approach with prior hx of back surgery, bil TKA, R THA and revision  Clinical Impression  Pt is s/p L THA resulting in the deficits listed below (see PT Problem List).  Pt will benefit from skilled PT to increase their independence and safety with mobility to allow discharge to the venue listed below.  Pt mobilizing well POD #0 and plans to d/c home with spouse.     Follow Up Recommendations Home health PT    Equipment Recommendations  None recommended by PT    Recommendations for Other Services       Precautions / Restrictions Precautions Precautions: Fall Restrictions Other Position/Activity Restrictions: WBAT      Mobility  Bed Mobility Overal bed mobility: Needs Assistance Bed Mobility: Supine to Sit     Supine to sit: Min assist;HOB elevated     General bed mobility comments: verbal cues for technique, assist for L LE   Transfers Overall transfer level: Needs assistance Equipment used: Rolling walker (2 wheeled) Transfers: Sit to/from Stand Sit to Stand: Min guard         General transfer comment: verbal cues for safe technique  Ambulation/Gait Ambulation/Gait assistance: Min guard Ambulation Distance (Feet): 50 Feet Assistive device: Rolling walker (2 wheeled) Gait Pattern/deviations: Step-to pattern;Trunk flexed;Antalgic     General Gait Details: verbal cues for sequence, RW distance, posture; appears pigeon toed bilaterally  Stairs            Wheelchair Mobility    Modified Rankin (Stroke Patients Only)       Balance                                             Pertinent Vitals/Pain Pain Assessment: 0-10 Pain Score: 6  Pain Location: L hip Pain Descriptors / Indicators: Aching;Sore Pain Intervention(s):  Limited activity within patient's tolerance;Monitored during session;Premedicated before session;Repositioned;Patient requesting pain meds-RN notified;Ice applied    Home Living Family/patient expects to be discharged to:: Private residence Living Arrangements: Spouse/significant other   Type of Home: House Home Access: Stairs to enter Entrance Stairs-Rails: None Entrance Stairs-Number of Steps: 1 Home Layout: One level Home Equipment: Environmental consultant - 2 wheels;Bedside commode      Prior Function Level of Independence: Independent               Hand Dominance        Extremity/Trunk Assessment               Lower Extremity Assessment: LLE deficits/detail   LLE Deficits / Details: functional hip weakness observed, assist for L LE     Communication   Communication: No difficulties  Cognition Arousal/Alertness: Awake/alert Behavior During Therapy: WFL for tasks assessed/performed Overall Cognitive Status: Within Functional Limits for tasks assessed                      General Comments      Exercises        Assessment/Plan    PT Assessment Patient needs continued PT services  PT Diagnosis Abnormality of gait;Acute pain   PT Problem List Decreased strength;Decreased mobility;Pain  PT Treatment Interventions Gait training;DME instruction;Stair training;Patient/family education;Functional mobility training;Therapeutic  activities;Therapeutic exercise   PT Goals (Current goals can be found in the Care Plan section) Acute Rehab PT Goals PT Goal Formulation: With patient Time For Goal Achievement: 10/25/14 Potential to Achieve Goals: Good    Frequency 7X/week   Barriers to discharge        Co-evaluation               End of Session Equipment Utilized During Treatment: Gait belt Activity Tolerance: Patient tolerated treatment well Patient left: in chair;with call bell/phone within reach;with nursing/sitter in room Nurse Communication: Mobility  status         Time: 7416-3845 PT Time Calculation (min) (ACUTE ONLY): 17 min   Charges:   PT Evaluation $Initial PT Evaluation Tier I: 1 Procedure     PT G Codes:        Biff Rutigliano,KATHrine E 10/21/2014, 4:24 PM Carmelia Bake, PT, DPT 10/21/2014 Pager: [phone]

## 2014-10-21 NOTE — Interval H&P Note (Signed)
History and Physical Interval Note:  10/21/2014 6:51 AM  [name].  has presented today for surgery, with the diagnosis of LEFT HIP OA  The various methods of treatment have been discussed with the patient and family. After consideration of risks, benefits and other options for treatment, the patient has consented to  Procedure(s): LEFT TOTAL HIP ARTHROPLASTY ANTERIOR APPROACH (Left) as a surgical intervention .  The patient's history has been reviewed, patient examined, no change in status, stable for surgery.  I have reviewed the patient's chart and labs.  Questions were answered to the patient's satisfaction.     Mauri Pole

## 2014-10-22 LAB — BASIC METABOLIC PANEL
Anion gap: 7 (ref 5–15)
BUN: 11 mg/dL (ref 6–20)
CO2: 27 mmol/L (ref 22–32)
CREATININE: 0.66 mg/dL (ref 0.61–1.24)
Calcium: 8.3 mg/dL — ABNORMAL LOW (ref 8.9–10.3)
Chloride: 102 mmol/L (ref 101–111)
GFR calc Af Amer: >60 mL/min (ref >60)
GFR calc non Af Amer: >60 mL/min (ref >60)
Glucose, Bld: 147 mg/dL — ABNORMAL HIGH (ref 65–99)
Potassium: 3.8 mmol/L (ref 3.5–5.1)
SODIUM: 136 mmol/L (ref 135–145)

## 2014-10-22 LAB — CBC
HCT: 31.3 % — ABNORMAL LOW (ref 39.0–52.0)
Hemoglobin: 10.1 g/dL — ABNORMAL LOW (ref 13.0–17.0)
MCH: 27.9 pg (ref 26.0–34.0)
MCHC: 32.3 g/dL (ref 30.0–36.0)
MCV: 86.5 fL (ref 78.0–100.0)
PLATELETS: 204 10*3/uL (ref 150–400)
RBC: 3.62 MIL/uL — ABNORMAL LOW (ref 4.22–5.81)
RDW: 13.8 % (ref 11.5–15.5)
WBC: 6.2 10*3/uL (ref 4.0–10.5)

## 2014-10-22 LAB — GLUCOSE, CAPILLARY: Glucose-Capillary: 157 mg/dL — ABNORMAL HIGH (ref 65–99)

## 2014-10-22 MED ORDER — ACETAMINOPHEN 325 MG PO TABS: 325.0000 mg | ORAL_TABLET | Freq: Four times a day (QID) | ORAL | Status: DC | PRN

## 2014-10-22 MED ORDER — DOCUSATE SODIUM 100 MG PO CAPS: 100.0000 mg | ORAL_CAPSULE | Freq: Two times a day (BID) | ORAL | Status: DC

## 2014-10-22 MED ORDER — ASPIRIN 325 MG PO TABS
325.0000 mg | ORAL_TABLET | Freq: Two times a day (BID) | ORAL | Status: DC
  Administered 2014-10-22: 325 mg via ORAL
  Filled 2014-10-22 (×2): qty 1

## 2014-10-22 MED ORDER — OXYCODONE HCL 10 MG PO TABS: 10.0000 mg | ORAL_TABLET | ORAL | Status: DC | PRN

## 2014-10-22 MED ORDER — FERROUS SULFATE 325 (65 FE) MG PO TABS: 325.0000 mg | ORAL_TABLET | Freq: Three times a day (TID) | ORAL | Status: DC

## 2014-10-22 MED ORDER — POLYETHYLENE GLYCOL 3350 17 G PO PACK: 17.0000 g | PACK | Freq: Two times a day (BID) | ORAL | Status: DC

## 2014-10-22 MED ORDER — OXYCODONE HCL 5 MG PO TABS
10.0000 mg | ORAL_TABLET | ORAL | Status: DC
  Administered 2014-10-22: 20 mg via ORAL
  Filled 2014-10-22: qty 4

## 2014-10-22 MED ORDER — RIVAROXABAN 10 MG PO TABS: 10.0000 mg | ORAL_TABLET | Freq: Every day | ORAL | Status: DC

## 2014-10-22 NOTE — Progress Notes (Signed)
Physical Therapy Treatment Patient Details Name: [name]. MRN: [id number] DOB: 1945/10/24 Today's Date: 10-29-14    History of Present Illness Pt is a 69 year old male s/p L THA direct anterior approach with prior hx of back surgery, bil TKA, R THA and revision    PT Comments    Pt ambulated in hallway and practiced one step.  Pt declined performing exercises stating he will perform once home.  Pt very eager to d/c.  Pt had no further questions.  Follow Up Recommendations  Home health PT     Equipment Recommendations  None recommended by PT    Recommendations for Other Services       Precautions / Restrictions Precautions Precautions: Fall Restrictions Weight Bearing Restrictions: No Other Position/Activity Restrictions: WBAT    Mobility  Bed Mobility Overal bed mobility: Needs Assistance Bed Mobility: Supine to Sit     Supine to sit: Supervision;HOB elevated        Transfers Overall transfer level: Needs assistance Equipment used: Rolling walker (2 wheeled) Transfers: Sit to/from Stand Sit to Stand: Supervision            Ambulation/Gait Ambulation/Gait assistance: Supervision Ambulation Distance (Feet): 200 Feet Assistive device: Rolling walker (2 wheeled) Gait Pattern/deviations: Step-through pattern;Trunk flexed     General Gait Details: verbal cues for RW distance, posture; appears pigeon toed bilaterally   Stairs Stairs: Yes Stairs assistance: Supervision Stair Management: Step to pattern;Forwards;With walker Number of Stairs: 1 General stair comments: verbal cues for sequence, performs safely  Wheelchair Mobility    Modified Rankin (Stroke Patients Only)       Balance                                    Cognition Arousal/Alertness: Awake/alert Behavior During Therapy: WFL for tasks assessed/performed Overall Cognitive Status: Within Functional Limits for tasks assessed                       Exercises      General Comments        Pertinent Vitals/Pain Pain Assessment: No/denies pain Pain Score: 5  Pain Location: L hip Pain Descriptors / Indicators: Aching;Sore Pain Intervention(s): Limited activity within patient's tolerance;Monitored during session;Repositioned;Premedicated before session;Ice applied    Home Living                      Prior Function            PT Goals (current goals can now be found in the care plan section) Acute Rehab PT Goals PT Goal Formulation: With patient Time For Goal Achievement: Oct 29, 2014 Potential to Achieve Goals: Good Progress towards PT goals: Progressing toward goals    Frequency  7X/week    PT Plan Current plan remains appropriate    Co-evaluation             End of Session Equipment Utilized During Treatment: Gait belt Activity Tolerance: Patient tolerated treatment well Patient left: in chair;with call bell/phone within reach     Time: 1006-1014 PT Time Calculation (min) (ACUTE ONLY): 8 min  Charges:  $Gait Training: 8-22 mins                    G Codes:      [name],[name] 2014/10/29, 10:28 AM [name], PT, DPT 10-29-2014 Pager: [phone]

## 2014-10-22 NOTE — Progress Notes (Signed)
     Subjective: 1 Day Post-Op Procedure(s) (LRB): LEFT TOTAL HIP ARTHROPLASTY ANTERIOR APPROACH (Left)   Patient reports pain as moderate, pain not fully controlled. Pain meds not quite lasting the duration of 4 hours.  No events throughout the night. Ready to be discharged home if the pain is controlled and he does well with PT.  Objective:   VITALS:   Filed Vitals:   10/22/14 0526  BP: 124/70  Pulse: 66  Temp: 99.1 F (37.3 C)  Resp: 18    Dorsiflexion/Plantar flexion intact Incision: dressing C/D/I No cellulitis present Compartment soft  LABS  Recent Labs  10/22/14 0515  HGB 10.1*  HCT 31.3*  WBC 6.2  PLT 204     Recent Labs  10/22/14 0515  NA 136  K 3.8  BUN 11  CREATININE 0.66  GLUCOSE 147*     Assessment/Plan: 1 Day Post-Op Procedure(s) (LRB): LEFT TOTAL HIP ARTHROPLASTY ANTERIOR APPROACH (Left) Increase amount of Oxy from 5-15 to 10-20 Foley cath d/c'ed Advance diet Up with therapy D/C IV fluids Discharge home with home health  Follow up in 2 weeks at [facility]. Follow up with OLIN,Emmarie Sannes D in 2 weeks.  Contact information:  [facility] [address], [place] [phone]    Obese (BMI 30-39.9) Estimated body mass index is 34.27 kg/(m^2) as calculated from the following:   Height as of this encounter: 6\' 2"  (1.88 m).   Weight as of this encounter: 121.11 kg (267 lb). Patient also counseled that weight may inhibit the healing process Patient counseled that losing weight will help with future health issues      West Pugh. Jeshua Ransford   PAC  10/22/2014, 8:15 AM

## 2014-10-22 NOTE — Care Management Note (Signed)
Case Management Note  Patient Details  Name: [name]. MRN: [id number] Date of Birth: [birth year]  Subjective/Objective:                   LEFT TOTAL HIP ARTHROPLASTY ANTERIOR APPROACH (Left) Action/Plan:  Discharge planning Expected Discharge Date:  10/22/14               Expected Discharge Plan:  [facility]  In-House Referral:     Discharge planning Services  CM Consult  Post Acute Care Choice:  Home Health Choice offered to:     DME Arranged:  N/A DME Agency:     HH Arranged:  PT HH Agency:  [place]  Status of Service:  Completed, signed off  Medicare Important Message Given:    Date Medicare IM Given:    Medicare IM give by:    Date Additional Medicare IM Given:    Additional Medicare Important Message give by:     If discussed at [place] of Stay Meetings, dates discussed:    Additional Comments: CM went to speak with pt who is with Leonides Grills of MD's office and Arville Go has already been arranged for HHPT.  Pt has DME at home.  Referral given to [facility] rep, Tim and no other CM needs were communicated. Dellie Catholic, RN 10/22/2014, 10:33 AM

## 2014-10-24 NOTE — Discharge Summary (Signed)
Physician Discharge Summary  Patient ID: [name]. MRN: [id number] DOB/AGE: [birth year] 69 y.o.  Admit date: 10/21/2014 Discharge date: 10/22/2014   Procedures:  Procedure(s) (LRB): LEFT TOTAL HIP ARTHROPLASTY ANTERIOR APPROACH (Left)  Attending Physician:  Dr. [name]   Admission Diagnoses:   Left hip primary OA / pain.  Discharge Diagnoses:  Principal Problem:   S/P left THA, AA  Past Medical History  Diagnosis Date  . Sleep apnea     settings 10.6  / followed by [name] study years ago  . Recurrent upper respiratory infection (URI)     states Dr Wynelle Link aware- no fever- states is improving  . Arthritis   . Depression     ADD  . Tremor     d/t lithium  . Bronchitis     hx of  . Insomnia   . BPH (benign prostatic hyperplasia)   . Complication of anesthesia 9/93/71    no complications but just says he typically needs "more than expected" to anesthetize; needs CPAP (setting 10) in recovery  . Bulge of cervical disc without myelopathy   . Asthma     anxiety, tree/grass pollen  . Diabetes mellitus     PCP [name], type 2    HPI:    [name]., 69 y.o. male, has a history of pain and functional disability in the left hip(s) due to arthritis and patient has failed non-surgical conservative treatments for greater than 12 weeks to include NSAID's and/or analgesics and activity modification. Onset of symptoms was gradual starting 1+ years ago with gradually worsening course since that time.The patient noted no past surgery on the left hip(s). Patient currently rates pain in the left hip at 9 out of 10 with activity. Patient has night pain, worsening of pain with activity and weight bearing, trendelenberg gait, pain that interfers with activities of daily living and pain with passive range of motion. Patient has evidence of periarticular osteophytes and joint space narrowing by imaging studies. This condition presents safety issues increasing the  risk of falls. There is no current active infection. Risks, benefits and expectations were discussed with the patient. Risks including but not limited to the risk of anesthesia, blood clots, nerve damage, blood vessel damage, failure of the prosthesis, infection and up to and including death. Patient understand the risks, benefits and expectations and wishes to proceed with surgery.   PCP: Dwan Bolt, MD   Discharged Condition: good  Hospital Course:  Patient underwent the above stated procedure on 10/21/2014. Patient tolerated the procedure well and brought to the recovery room in good condition and subsequently to the floor.  POD #1 BP: 124/70 ; Pulse: 66 ; Temp: 99.1 F (37.3 C) ; Resp: 18 Patient reports pain as moderate, pain not fully controlled. Pain meds not quite lasting the duration of 4 hours. No events throughout the night. Ready to be discharged home. Dorsiflexion/plantar flexion intact, incision: dressing C/D/I, no cellulitis present and compartment soft.   LABS  Basename    HGB  10.1  HCT  31.3    Discharge Exam: General appearance: alert, cooperative and no distress Extremities: Homans sign is negative, no sign of DVT, no edema, redness or tenderness in the calves or thighs and no ulcers, gangrene or trophic changes  Disposition: Home with follow up in 2 weeks   Follow-up Information    Follow up with Mauri Pole, MD. Schedule an appointment as soon as possible for a visit in 2  weeks.   Specialty:  Orthopedic Surgery   Contact information:   [address] [place] [address] [phone]       Follow up with [facility].   Why:  home health physical therapy   Contact information:   [place]  [place] [address] [phone]       Discharge Instructions    Call MD / Call 911    Complete by:  As directed   If you experience chest pain or shortness of breath, CALL 911 and be transported to the  hospital emergency room.  If you develope a fever above 101 F, pus (white drainage) or increased drainage or redness at the wound, or calf pain, call your surgeon's office.     Change dressing    Complete by:  As directed   Maintain surgical dressing until follow up in the clinic. If the edges start to pull up, may reinforce with tape. If the dressing is no longer working, may remove and cover with gauze and tape, but must keep the area dry and clean.  Call with any questions or concerns.     Constipation Prevention    Complete by:  As directed   Drink plenty of fluids.  Prune juice may be helpful.  You may use a stool softener, such as Colace (over the counter) 100 mg twice a day.  Use MiraLax (over the counter) for constipation as needed.     Diet - low sodium heart healthy    Complete by:  As directed      Discharge instructions    Complete by:  As directed   Maintain surgical dressing until follow up in the clinic. If the edges start to pull up, may reinforce with tape. If the dressing is no longer working, may remove and cover with gauze and tape, but must keep the area dry and clean.  Follow up in 2 weeks at [facility]. Call with any questions or concerns.     Increase activity slowly as tolerated    Complete by:  As directed   Weight bearing as tolerated with assist device (walker, cane, etc) as directed, use it as long as suggested by your surgeon or therapist, typically at least 4-6 weeks.     TED hose    Complete by:  As directed   Use stockings (TED hose) for 2 weeks on both leg(s).  You may remove them at night for sleeping.             Medication List    TAKE these medications        acetaminophen 325 MG tablet  Commonly known as:  TYLENOL  Take 1-2 tablets (325-650 mg total) by mouth every 6 (six) hours as needed for mild pain or moderate pain.     albuterol 108 (90 BASE) MCG/ACT inhaler  Commonly known as:  PROVENTIL HFA;VENTOLIN HFA  Inhale 2 puffs into  the lungs every 6 (six) hours as needed for wheezing or shortness of breath.     ALPRAZolam 0.5 MG tablet  Commonly known as:  XANAX  Take 0.5 mg by mouth 3 (three) times daily as needed for anxiety.     aspirin EC 325 MG tablet  Take 1 tablet (325 mg total) by mouth 2 (two) times daily. Take for 4 weeks.     budesonide-formoterol 160-4.5 MCG/ACT inhaler  Commonly known as:  SYMBICORT  Inhale 1 puff into the lungs 2 (two) times daily.  docusate sodium 100 MG capsule  Commonly known as:  COLACE  Take 1 capsule (100 mg total) by mouth 2 (two) times daily.     dutasteride 0.5 MG capsule  Commonly known as:  AVODART  Take 0.5 mg by mouth daily.     ferrous sulfate 325 (65 FE) MG tablet  Take 1 tablet (325 mg total) by mouth 3 (three) times daily after meals.     HYDROcodone-acetaminophen 10-325 MG per tablet  Commonly known as:  NORCO  Take 1-2 tablets by mouth every 6 (six) hours as needed for moderate pain.     lithium carbonate 450 MG CR tablet  Commonly known as:  ESKALITH  Take 450 mg by mouth at bedtime.     metFORMIN 500 MG tablet  Commonly known as:  GLUCOPHAGE  Take 1,000 mg by mouth 2 (two) times daily with a meal.     omeprazole 20 MG capsule  Commonly known as:  PRILOSEC  Take 20 mg by mouth 2 (two) times daily.     Oxycodone HCl 10 MG Tabs  Take 1-2 tablets (10-20 mg total) by mouth every 4 (four) hours as needed for severe pain.     pioglitazone 45 MG tablet  Commonly known as:  ACTOS  Take 45 mg by mouth daily.     polyethylene glycol packet  Commonly known as:  MIRALAX / GLYCOLAX  Take 17 g by mouth 2 (two) times daily.     primidone 50 MG tablet  Commonly known as:  MYSOLINE  Take 1 tablet (50 mg total) by mouth 3 (three) times daily.     sertraline 100 MG tablet  Commonly known as:  ZOLOFT  Take 200 mg by mouth daily.     tamsulosin 0.4 MG Caps capsule  Commonly known as:  FLOMAX  Take 0.4 mg by mouth daily.     tiZANidine 4 MG tablet    Commonly known as:  ZANAFLEX  Take 1 tablet (4 mg total) by mouth every 6 (six) hours as needed for muscle spasms.         Signed: West Pugh. Alecxis Baltzell   PA-C  10/24/2014, 8:18 AM

## 2014-11-05 ENCOUNTER — Ambulatory Visit: Payer: Medicare Other | Admitting: Pulmonary Disease

## 2014-11-05 DIAGNOSIS — Z471 Aftercare following joint replacement surgery: Secondary | ICD-10-CM | POA: Diagnosis not present

## 2014-11-05 DIAGNOSIS — M1612 Unilateral primary osteoarthritis, left hip: Secondary | ICD-10-CM | POA: Diagnosis not present

## 2014-11-05 DIAGNOSIS — Z96642 Presence of left artificial hip joint: Secondary | ICD-10-CM | POA: Diagnosis not present

## 2014-11-10 DIAGNOSIS — G894 Chronic pain syndrome: Secondary | ICD-10-CM | POA: Diagnosis not present

## 2014-11-10 DIAGNOSIS — M961 Postlaminectomy syndrome, not elsewhere classified: Secondary | ICD-10-CM | POA: Diagnosis not present

## 2014-11-13 DIAGNOSIS — F3130 Bipolar disorder, current episode depressed, mild or moderate severity, unspecified: Secondary | ICD-10-CM | POA: Diagnosis not present

## 2014-12-01 ENCOUNTER — Encounter: Payer: Self-pay | Admitting: Pulmonary Disease

## 2014-12-01 ENCOUNTER — Ambulatory Visit (INDEPENDENT_AMBULATORY_CARE_PROVIDER_SITE_OTHER): Payer: Medicare Other | Admitting: Pulmonary Disease

## 2014-12-01 VITALS — BP 124/78 | HR 66 | Ht 77.0 in | Wt 256.6 lb

## 2014-12-01 DIAGNOSIS — G4733 Obstructive sleep apnea (adult) (pediatric): Secondary | ICD-10-CM | POA: Diagnosis not present

## 2014-12-01 DIAGNOSIS — Z23 Encounter for immunization: Secondary | ICD-10-CM

## 2014-12-01 DIAGNOSIS — Z9989 Dependence on other enabling machines and devices: Principal | ICD-10-CM

## 2014-12-01 NOTE — Progress Notes (Signed)
   Subjective:    Patient ID: [name]., male    DOB: 04-23-1945, 69 y.o.   MRN: [id number]  HPI  69/M for annual followup of obstructive sleep apnea  Dxed 1996,Titration study 2008 with optimal pressure 14cm. S/p bariatric surgery, started at 350 , lowest 222 Auto 11/2012: optimal pressure 10cm  12/01/2014  Chief Complaint  Patient presents with  . Follow-up    Wears cpap every night, approx 8 hours nightly. Renew supplies. can no longer use [employer] due to insurance.  Needs new DME that takes his insurance.  Discuss Flu shot. No download available.    He has gained weight again to his current Wt @ 256 He is compliant with CPAP, last download showed multiple settings with an average pressure of 10. He denies problems with pressure or dryness He decided to delay his sinus surgery for deviated septum He is recovering from hip surgery. On occasion when he does not use the machine, his wife does report loud snoring and witnessed apneas  Review of Systems neg for any significant sore throat, dysphagia, itching, sneezing, nasal congestion or excess/ purulent secretions, fever, chills, sweats, unintended wt loss, pleuritic or exertional cp, hempoptysis, orthopnea pnd or change in chronic leg swelling. Also denies presyncope, palpitations, heartburn, abdominal pain, nausea, vomiting, diarrhea or change in bowel or urinary habits, dysuria,hematuria, rash, arthralgias, visual complaints, headache, numbness weakness or ataxia.     Objective:   Physical Exam  Gen. Pleasant, obese, in no distress ENT - no lesions, no post nasal drip Neck: No JVD, no thyromegaly, no carotid bruits Lungs: no use of accessory muscles, no dullness to percussion, decreased without rales or rhonchi  Cardiovascular: Rhythm regular, heart sounds  normal, no murmurs or gallops, no peripheral edema Musculoskeletal: No deformities, no cyanosis or clubbing , no tremors        Assessment & Plan:

## 2014-12-01 NOTE — Patient Instructions (Signed)
Stay on auto settings Netti pot Flu shot

## 2014-12-01 NOTE — Assessment & Plan Note (Signed)
Ct auto settings  get him new supplier Review download He is still symptomatic without machine - so still has OSA, defer rpt study for now  Weight loss encouraged, compliance with goal of at least 4-6 hrs every night is the expectation. Advised against medications with sedative side effects Cautioned against driving when sleepy - understanding that sleepiness will vary on a day to day basis

## 2014-12-05 DIAGNOSIS — Z96642 Presence of left artificial hip joint: Secondary | ICD-10-CM | POA: Diagnosis not present

## 2014-12-05 DIAGNOSIS — Z471 Aftercare following joint replacement surgery: Secondary | ICD-10-CM | POA: Diagnosis not present

## 2014-12-08 DIAGNOSIS — E118 Type 2 diabetes mellitus with unspecified complications: Secondary | ICD-10-CM | POA: Diagnosis not present

## 2014-12-15 DIAGNOSIS — M549 Dorsalgia, unspecified: Secondary | ICD-10-CM | POA: Diagnosis not present

## 2014-12-15 DIAGNOSIS — M25569 Pain in unspecified knee: Secondary | ICD-10-CM | POA: Diagnosis not present

## 2014-12-15 DIAGNOSIS — E118 Type 2 diabetes mellitus with unspecified complications: Secondary | ICD-10-CM | POA: Diagnosis not present

## 2014-12-22 DIAGNOSIS — M25559 Pain in unspecified hip: Secondary | ICD-10-CM | POA: Diagnosis not present

## 2014-12-22 DIAGNOSIS — M549 Dorsalgia, unspecified: Secondary | ICD-10-CM | POA: Diagnosis not present

## 2014-12-22 DIAGNOSIS — Z23 Encounter for immunization: Secondary | ICD-10-CM | POA: Diagnosis not present

## 2014-12-22 DIAGNOSIS — E118 Type 2 diabetes mellitus with unspecified complications: Secondary | ICD-10-CM | POA: Diagnosis not present

## 2015-01-05 ENCOUNTER — Ambulatory Visit: Payer: Medicare Other | Attending: Endocrinology | Admitting: Physical Therapy

## 2015-01-05 DIAGNOSIS — M25652 Stiffness of left hip, not elsewhere classified: Secondary | ICD-10-CM | POA: Diagnosis not present

## 2015-01-05 DIAGNOSIS — R6889 Other general symptoms and signs: Secondary | ICD-10-CM | POA: Insufficient documentation

## 2015-01-05 DIAGNOSIS — R29898 Other symptoms and signs involving the musculoskeletal system: Secondary | ICD-10-CM | POA: Diagnosis not present

## 2015-01-05 DIAGNOSIS — M25552 Pain in left hip: Secondary | ICD-10-CM | POA: Insufficient documentation

## 2015-01-05 NOTE — Therapy (Signed)
[name] [place], [place], [address] Phone: [phone]   Fax:  [phone]  Physical Therapy Evaluation  Patient Details  Name: [name]. MRN: [id number] Date of Birth: 04-23-1945 Referring Provider: [name]  Encounter Date: 01/05/2015      PT End of Session - 01/05/15 0950    Visit Number 1   Number of Visits 8   Date for PT Re-Evaluation 02/02/15   PT Start Time 0906   PT Stop Time 1002   PT Time Calculation (min) 56 min   Activity Tolerance Patient tolerated treatment well   Behavior During Therapy [name] for tasks assessed/performed      Past Medical History  Diagnosis Date  . Sleep apnea     settings 10.6  / followed by [name] study years ago  . Recurrent upper respiratory infection (URI)     states Dr Wynelle Link aware- no fever- states is improving  . Arthritis   . Depression     ADD  . Tremor     d/t lithium  . Bronchitis     hx of  . Insomnia   . BPH (benign prostatic hyperplasia)   . Complication of anesthesia 7/61/95    no complications but just says he typically needs "more than expected" to anesthetize; needs CPAP (setting 10) in recovery  . Bulge of cervical disc without myelopathy   . Asthma     anxiety, tree/grass pollen  . Diabetes mellitus     PCP [name], type 2    Past Surgical History  Procedure Laterality Date  . Cardiac catheterization      2008 pre op gastric bypass  . Joint replacement      Left, Right Knee, Right hip  . Knee arthroscopy      right x 3-4, left x 2  . Knee arthrotomy  ~1991    right  . 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 Polypectomy      vocal cords 1992  . Total hip revision  04/22/2011    Procedure: TOTAL HIP REVISION;  Surgeon: Gearlean Alf, MD;  Location: [facility];  Service: Orthopedics;  Laterality: Right;  . Colonoscopy w/ polypectomy    . Lumbar fusion  04/03/2013    [name], L5-S1  . Total hip arthroplasty Left  10/21/2014    Procedure: LEFT TOTAL HIP ARTHROPLASTY ANTERIOR APPROACH;  Surgeon: [name], MD;  Location: [facility];  Service: Orthopedics;  Laterality: Left;    There were no vitals filed for this visit.  Visit Diagnosis:  Hip pain, acute, left - Plan: PT plan of care cert/re-cert  Activity intolerance - Plan: PT plan of care cert/re-cert  Weakness of left hip - Plan: PT plan of care cert/re-cert  Stiffness of left hip joint - Plan: PT plan of care cert/re-cert      Subjective Assessment - 01/05/15 0912    Subjective Patient had L THA anterior done on 10/21/14. The joint feels good but since surgery he has pain in the left lateral hip and knee and into the groin.    How long can you walk comfortably? about .25 mile; one lap around [employer] before requires cart to lean on   Patient Stated Goals to get rid of pain   Currently in Pain? Yes  max 6-8/10   Pain Score 4    Pain Location Hip   Pain Orientation Left   Pain Descriptors /  Indicators --  short lived but repetitive   Pain Type Acute pain   Pain Radiating Towards into groin   Pain Onset More than a month ago   Pain Frequency Intermittent   Aggravating Factors  keeping knee and hip bent   Pain Relieving Factors relaxing ITB, heat   Effect of Pain on Daily Activities painful   Multiple Pain Sites No            [facility] PT Assessment - 01/05/15 0001    Assessment   Medical Diagnosis L hip pain s/p ant replacement   Referring Provider [name]   Onset Date/Surgical Date 10/21/14   Next MD Visit 01/29/15  sees [name] in a few weeks   Prior Therapy for knees and other hip   Precautions   Precautions Anterior Hip   Precaution Comments no [place] to hip joint   Balance Screen   Has the patient fallen in the past 6 months Yes   How many times? 1  right before surgery; bending down and lost balance   Has the patient had a decrease in activity level because of a fear of falling?  No   Is the patient reluctant to leave  their home because of a fear of falling?  No   Home Environment   Additional Comments one step to get in the home with one railing on left   Observation/Other Assessments   Focus on Therapeutic Outcomes (FOTO)  56% limited   ROM / Strength   AROM / PROM / Strength AROM;Strength   Strength   Strength Assessment Site Hip;Knee   Right/Left Hip Left   Left Hip Flexion 4+/5   Left Hip Extension 4/5   Left Hip External Rotation 5/5   Left Hip ABduction 5/5   Right/Left Knee Left   Left Knee Flexion 5/5   Left Knee Extension 5/5   Flexibility   Soft Tissue Assessment /Muscle Length --  +ober on L   Palpation   Palpation comment marked tendeness of TFL and ITB                   [facility] Adult PT Treatment/Exercise - 01/05/15 0001    Exercises   Exercises Knee/Hip   Knee/Hip Exercises: Stretches   Hip Flexor Stretch 2 reps;60 seconds   ITB Stretch 2 reps;60 seconds   Modalities   Modalities Electrical Stimulation;Moist Heat   Moist Heat Therapy   Number Minutes Moist Heat 15 Minutes   Moist Heat Location Hip   Electrical Stimulation   Electrical Stimulation Location L hip/TFL   Electrical Stimulation Action premod   Electrical Stimulation Parameters 80-150 hz to tolerance x 15 min   Electrical Stimulation Goals Tone;Pain   Manual Therapy   Manual Therapy Myofascial release   Myofascial Release L ITB in sidelying                PT Education - 01/05/15 0954    Education provided Yes   Education Details hep   Person(s) Educated Patient   Methods Explanation;Demonstration;Handout   Comprehension Verbalized understanding;Returned demonstration          PT Short Term Goals - 01/05/15 1008    PT SHORT TERM GOAL #1   Title I with initial HEP   Time 2   Period Weeks   Status New           PT Long Term Goals - 01/05/15 1008    PT LONG TERM GOAL #1  Title I with advanced HEP   Time 4   Period Weeks   Status New   PT LONG TERM GOAL #2   Title  decreased pain to 1/10 or less in L hip and ITB with ADLS   Time 4   Period Weeks   Status New   PT LONG TERM GOAL #3   Title patient not restricted with waking by LLE pain   Time 4   Period Weeks   Status New               Plan - 2015-01-09 0959    Clinical Impression Statement Patient is a 69 yr old male s/p ant L THA on 10/21/14. Since that time he has had pain in his L ITB and TFL affecting amb endurance and other ADLs. Signs and symptoms consistent with tight ITB and TFL.   Pt will benefit from skilled therapeutic intervention in order to improve on the following deficits Decreased range of motion;Decreased activity tolerance;Pain;Impaired flexibility;Decreased strength   Rehab Potential Excellent   Clinical Impairments Affecting Rehab Potential stenosis   PT Frequency 2x / week   PT Duration 4 weeks   PT Treatment/Interventions ADLs/Self Care Home Management;Electrical Stimulation;Moist Heat;Cryotherapy;Therapeutic exercise;Ultrasound;Patient/family education;Manual techniques;Dry needling;Passive range of motion;Taping   PT Next Visit Plan Bike/Nustep; STW/MFR to L TFL and ITB, flexibility of same plus HS; modalities prn   PT Home Exercise Plan sidelying ITB stretch and supine HF stretch   Consulted and Agree with Plan of Care Patient          G-Codes - 01-09-2015 1011    Functional Assessment Tool Used FOTO 56% limited   Functional Limitation Mobility: Walking and moving around   Mobility: Walking and Moving Around Current Status [phone]) At least 40 percent but less than 60 percent impaired, limited or restricted   Mobility: Walking and Moving Around Goal Status [phone]) At least 40 percent but less than 60 percent impaired, limited or restricted       Problem List Patient Active Problem List   Diagnosis Date Noted  . S/P left THA, AA 10/21/2014  . S/P lumbar spinal fusion 04/03/2013  . Sinusitis, chronic 12/20/2012  . Abnormality of gait 10/31/2012  . Tremor  10/31/2012  . Cough 10/14/2012  . Extrinsic asthma, unspecified 09/27/2012  . Hip pain, right 04/22/2011  . OSA on CPAP 12/26/2006    Madelyn Flavors PT  01-09-15, 10:15 AM  [facility] [address] [place], [place], [address] Phone: [phone]   Fax:  [phone]  Name: Deen Deguia. MRN: [id number] Date of Birth: 04/17/1945

## 2015-01-07 NOTE — Telephone Encounter (Signed)
Error

## 2015-01-08 ENCOUNTER — Encounter: Payer: Self-pay | Admitting: Physical Therapy

## 2015-01-08 ENCOUNTER — Ambulatory Visit: Payer: Medicare Other | Admitting: Physical Therapy

## 2015-01-08 DIAGNOSIS — M25552 Pain in left hip: Secondary | ICD-10-CM

## 2015-01-08 DIAGNOSIS — R29898 Other symptoms and signs involving the musculoskeletal system: Secondary | ICD-10-CM | POA: Diagnosis not present

## 2015-01-08 DIAGNOSIS — M25652 Stiffness of left hip, not elsewhere classified: Secondary | ICD-10-CM

## 2015-01-08 DIAGNOSIS — R6889 Other general symptoms and signs: Secondary | ICD-10-CM

## 2015-01-08 NOTE — Therapy (Signed)
[facility] [place], [place], [address] Phone: [phone]   Fax:  [phone]  Physical Therapy Treatment  Patient Details  Name: [name]. MRN: [id number] Date of Birth: July 12, 1945 Referring Provider: [name]  Encounter Date: 01/08/2015      PT End of Session - 01/08/15 0919    Visit Number 2   Number of Visits 8   Date for PT Re-Evaluation 02/02/15   PT Start Time 0840   PT Stop Time 0930   PT Time Calculation (min) 50 min   Activity Tolerance Patient tolerated treatment well   Behavior During Therapy [facility] for tasks assessed/performed      Past Medical History  Diagnosis Date  . Sleep apnea     settings 10.6  / followed by [name] study years ago  . Recurrent upper respiratory infection (URI)     states Dr Wynelle Link aware- no fever- states is improving  . Arthritis   . Depression     ADD  . Tremor     d/t lithium  . Bronchitis     hx of  . Insomnia   . BPH (benign prostatic hyperplasia)   . Complication of anesthesia 5/57/32    no complications but just says he typically needs "more than expected" to anesthetize; needs CPAP (setting 10) in recovery  . Bulge of cervical disc without myelopathy   . Asthma     anxiety, tree/grass pollen  . Diabetes mellitus     PCP [name], type 2    Past Surgical History  Procedure Laterality Date  . Cardiac catheterization      2008 pre op gastric bypass  . Joint replacement      Left, Right Knee, Right hip  . Knee arthroscopy      right x 3-4, left x 2  . Knee arthrotomy  ~1991    right  . 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 Polypectomy      vocal cords 1992  . Total hip revision  04/22/2011    Procedure: TOTAL HIP REVISION;  Surgeon: Gearlean Alf, MD;  [facility]: [facility];  Service: Orthopedics;  Laterality: Right;  . Colonoscopy w/ polypectomy    . Lumbar fusion  04/03/2013    [name], L5-S1  . Total hip arthroplasty Left  10/21/2014    Procedure: LEFT TOTAL HIP ARTHROPLASTY ANTERIOR APPROACH;  Surgeon: [name], MD;  Location: [facility];  Service: Orthopedics;  Laterality: Left;    There were no vitals filed for this visit.  Visit Diagnosis:  Hip pain, acute, left  Activity intolerance  Weakness of left hip  Stiffness of left hip joint      Subjective Assessment - 01/08/15 0833    Subjective States that L hip area and ITB area is still tight.   How long can you walk comfortably? about .25 mile; one lap around [employer] before requires cart to lean on   Patient Stated Goals to get rid of pain   Currently in Pain? Yes   Pain Score 4    Pain Location Hip   Pain Orientation Left;Lateral   Pain Descriptors / Indicators Sore;Throbbing;Tightness   Pain Type Acute pain   Pain Onset More than a month ago            [facility] PT Assessment - 01/08/15 0001    Assessment   Medical Diagnosis L hip pain s/p ant replacement  Onset Date/Surgical Date 10/21/14   Next MD Visit 01/29/15   Prior Therapy for knees and other hip   Precautions   Precautions Anterior Hip   Precaution Comments no [place] to hip joint                     [facility] Adult PT Treatment/Exercise - 01/08/15 0001    Modalities   Modalities Electrical Stimulation;Moist Heat   Moist Heat Therapy   Number Minutes Moist Heat 15 Minutes   Moist Heat Location Hip   Electrical Stimulation   Electrical Stimulation Location L ITB   Electrical Stimulation Action Pre-Mod   Electrical Stimulation Parameters 80-150 Hz x15 min   Electrical Stimulation Goals Tone;Pain   Manual Therapy   Manual Therapy Myofascial release   Myofascial Release L ITB in sidelying                  PT Short Term Goals - 01/05/15 1008    PT SHORT TERM GOAL #1   Title I with initial HEP   Time 2   Period Weeks   Status New           PT Long Term Goals - 01/05/15 1008    PT LONG TERM GOAL #1   Title I with advanced HEP   Time 4   Period  Weeks   Status New   PT LONG TERM GOAL #2   Title decreased pain to 1/10 or less in L hip and ITB with ADLS   Time 4   Period Weeks   Status New   PT LONG TERM GOAL #3   Title patient not restricted with waking by LLE pain   Time 4   Period Weeks   Status New               Plan - 01/08/15 2878    Clinical Impression Statement Patient tolerated today's treatment well although he reported feeling "stuff moving" during MFR. Notable L anterior hip and ITB tightness was noted and patient reported greater tension mid ITB. Normal modaliites response noted following removal of the modalities. Experienced 3/10 L hip tightness following today's treatment.   Pt will benefit from skilled therapeutic intervention in order to improve on the following deficits Decreased range of motion;Decreased activity tolerance;Pain;Impaired flexibility;Decreased strength   Rehab Potential Excellent   Clinical Impairments Affecting Rehab Potential stenosis   PT Frequency 2x / week   PT Treatment/Interventions ADLs/Self Care Home Management;Electrical Stimulation;Moist Heat;Cryotherapy;Therapeutic exercise;Ultrasound;Patient/family education;Manual techniques;Dry needling;Passive range of motion;Taping   PT Next Visit Plan Bike/Nustep; STW/MFR to L TFL and ITB, flexibility of same plus HS; modalities prn   Consulted and Agree with Plan of Care Patient        Problem List Patient Active Problem List   Diagnosis Date Noted  . S/P left THA, AA 10/21/2014  . S/P lumbar spinal fusion 04/03/2013  . Sinusitis, chronic 12/20/2012  . Abnormality of gait 10/31/2012  . Tremor 10/31/2012  . Cough 10/14/2012  . Extrinsic asthma, unspecified 09/27/2012  . Hip pain, right 04/22/2011  . OSA on CPAP 12/26/2006    Wynelle Fanny, PTA 01/08/2015, 9:42 AM  [facility] [address] [place], [place], [address] Phone: [phone]   Fax:  [phone]  Name: [name]. MRN: [id number] Date of Birth: [birth year]

## 2015-01-12 DIAGNOSIS — F3130 Bipolar disorder, current episode depressed, mild or moderate severity, unspecified: Secondary | ICD-10-CM | POA: Diagnosis not present

## 2015-01-13 ENCOUNTER — Ambulatory Visit: Payer: Medicare Other | Attending: Endocrinology | Admitting: *Deleted

## 2015-01-13 ENCOUNTER — Encounter: Payer: Self-pay | Admitting: *Deleted

## 2015-01-13 DIAGNOSIS — M25552 Pain in left hip: Secondary | ICD-10-CM | POA: Diagnosis not present

## 2015-01-13 DIAGNOSIS — R29898 Other symptoms and signs involving the musculoskeletal system: Secondary | ICD-10-CM | POA: Insufficient documentation

## 2015-01-13 DIAGNOSIS — M25652 Stiffness of left hip, not elsewhere classified: Secondary | ICD-10-CM | POA: Diagnosis not present

## 2015-01-13 DIAGNOSIS — R6889 Other general symptoms and signs: Secondary | ICD-10-CM | POA: Insufficient documentation

## 2015-01-13 NOTE — Therapy (Signed)
[facility] [place], [place], [address] Phone: [phone]   Fax:  [phone]  Physical Therapy Treatment  Patient Details  Name: [name]. MRN: [id number] Date of Birth: 1945/07/20 Referring Provider: [name]  Encounter Date: 01/13/2015      PT End of Session - 01/13/15 1052    Visit Number 3   Number of Visits 8   Date for PT Re-Evaluation 02/02/15   PT Start Time 1030   PT Stop Time 1117   PT Time Calculation (min) 47 min      Past Medical History  Diagnosis Date  . Sleep apnea     settings 10.6  / followed by [name] study years ago  . Recurrent upper respiratory infection (URI)     states Dr Wynelle Link aware- no fever- states is improving  . Arthritis   . Depression     ADD  . Tremor     d/t lithium  . Bronchitis     hx of  . Insomnia   . BPH (benign prostatic hyperplasia)   . Complication of anesthesia 0/16/01    no complications but just says he typically needs "more than expected" to anesthetize; needs CPAP (setting 10) in recovery  . Bulge of cervical disc without myelopathy   . Asthma     anxiety, tree/grass pollen  . Diabetes mellitus     PCP [name], type 2    Past Surgical History  Procedure Laterality Date  . Cardiac catheterization      2008 pre op gastric bypass  . Joint replacement      Left, Right Knee, Right hip  . Knee arthroscopy      right x 3-4, left x 2  . Knee arthrotomy  ~1991    right  . 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 Polypectomy      vocal cords 1992  . Total hip revision  04/22/2011    Procedure: TOTAL HIP REVISION;  Surgeon: Gearlean Alf, MD;  Location: [facility];  Service: Orthopedics;  Laterality: Right;  . Colonoscopy w/ polypectomy    . Lumbar fusion  04/03/2013    [name], L5-S1  . Total hip arthroplasty Left 10/21/2014    Procedure: LEFT TOTAL HIP ARTHROPLASTY ANTERIOR APPROACH;  Surgeon: [name], MD;  Location: [facility];   Service: Orthopedics;  Laterality: Left;    There were no vitals filed for this visit.  Visit Diagnosis:  Hip pain, acute, left  Weakness of left hip  Stiffness of left hip joint  Activity intolerance      Subjective Assessment - 01/13/15 1046    Subjective States that L hip area and ITB area is still tight. Feeling better   How long can you walk comfortably? about .25 mile; one lap around [employer] before requires cart to lean on   Patient Stated Goals to get rid of pain   Currently in Pain? Yes   Pain Score 4    Pain Location Hip   Pain Orientation Left;Lateral   Pain Type Acute pain   Pain Onset More than a month ago   Pain Frequency Intermittent   Aggravating Factors  keeping knee and hip bent   Pain Relieving Factors heat                         [facility] Adult PT Treatment/Exercise - 01/13/15 0001  Modalities   Modalities Electrical Stimulation;Moist Heat   Moist Heat Therapy   Number Minutes Moist Heat 15 Minutes   Moist Heat Location Hip   Electrical Stimulation   Electrical Stimulation Location L ITB premod x15 min 80-150hz    Electrical Stimulation Goals Tone;Pain   Manual Therapy   Manual Therapy Myofascial release   Myofascial Release L ITB in sidelying. IASTW to entire ITB f/b STW and ITB Lifts  to seperate tissues                                                                                                      Patella mobs medial glides LT knee              PT Short Term Goals - 01/05/15 1008    PT SHORT TERM GOAL #1   Title I with initial HEP   Time 2   Period Weeks   Status New           PT Long Term Goals - 01/05/15 1008    PT LONG TERM GOAL #1   Title I with advanced HEP   Time 4   Period Weeks   Status New   PT LONG TERM GOAL #2   Title decreased pain to 1/10 or less in L hip and ITB with ADLS   Time 4   Period Weeks   Status New   PT LONG TERM GOAL #3   Title patient not restricted with waking by LLE pain    Time 4   Period Weeks   Status New               Plan - 01/13/15 1125    Clinical Impression Statement Pt did fairly well with Rx today and felt better after Rx. He remains sore and tight along entire ITB. He feels that the stretches at home are helping   Pt will benefit from skilled therapeutic intervention in order to improve on the following deficits Decreased range of motion;Decreased activity tolerance;Pain;Impaired flexibility;Decreased strength   Rehab Potential Excellent   Clinical Impairments Affecting Rehab Potential stenosis   PT Frequency 2x / week   PT Duration 4 weeks   PT Treatment/Interventions ADLs/Self Care Home Management;Electrical Stimulation;Moist Heat;Cryotherapy;Therapeutic exercise;Ultrasound;Patient/family education;Manual techniques;Dry needling;Passive range of motion;Taping   PT Next Visit Plan Bike/Nustep; STW/MFR to L TFL and ITB, flexibility of same plus HS; modalities prn   PT Home Exercise Plan sidelying ITB stretch and supine HF stretch   Consulted and Agree with Plan of Care Patient        Problem List Patient Active Problem List   Diagnosis Date Noted  . S/P left THA, AA 10/21/2014  . S/P lumbar spinal fusion 04/03/2013  . Sinusitis, chronic 12/20/2012  . Abnormality of gait 10/31/2012  . Tremor 10/31/2012  . Cough 10/14/2012  . Extrinsic asthma, unspecified 09/27/2012  . Hip pain, right 04/22/2011  . OSA on CPAP 12/26/2006    RAMSEUR,CHRIS, PTA 01/13/2015, 11:29 AM  [facility] [address] [place], [place], [address] Phone: [phone]  Fax:  [phone]  Name: [name]. MRN: [id number] Date of Birth: Nov 27, 1945

## 2015-01-15 ENCOUNTER — Encounter: Payer: Self-pay | Admitting: *Deleted

## 2015-01-15 ENCOUNTER — Ambulatory Visit: Payer: Medicare Other | Admitting: *Deleted

## 2015-01-15 DIAGNOSIS — R6889 Other general symptoms and signs: Secondary | ICD-10-CM | POA: Diagnosis not present

## 2015-01-15 DIAGNOSIS — M25552 Pain in left hip: Secondary | ICD-10-CM

## 2015-01-15 DIAGNOSIS — M25652 Stiffness of left hip, not elsewhere classified: Secondary | ICD-10-CM

## 2015-01-15 DIAGNOSIS — R29898 Other symptoms and signs involving the musculoskeletal system: Secondary | ICD-10-CM | POA: Diagnosis not present

## 2015-01-15 NOTE — Therapy (Signed)
[facility] [place], [place], [address] Phone: [phone]   Fax:  [phone]  Physical Therapy Treatment  Patient Details  Name: [name]. MRN: [id number] Date of Birth: Jun 11, 1945 Referring Provider: [name]  Encounter Date: 01/15/2015      PT End of Session - 01/15/15 1125    Visit Number 4   Number of Visits 8   Date for PT Re-Evaluation 02/02/15   PT Start Time 1115   PT Stop Time 4825   PT Time Calculation (min) 49 min      Past Medical History  Diagnosis Date  . Sleep apnea     settings 10.6  / followed by [name] study years ago  . Recurrent upper respiratory infection (URI)     states Dr Wynelle Link aware- no fever- states is improving  . Arthritis   . Depression     ADD  . Tremor     d/t lithium  . Bronchitis     hx of  . Insomnia   . BPH (benign prostatic hyperplasia)   . Complication of anesthesia 0/03/70    no complications but just says he typically needs "more than expected" to anesthetize; needs CPAP (setting 10) in recovery  . Bulge of cervical disc without myelopathy   . Asthma     anxiety, tree/grass pollen  . Diabetes mellitus     PCP [name], type 2    Past Surgical History  Procedure Laterality Date  . Cardiac catheterization      2008 pre op gastric bypass  . Joint replacement      Left, Right Knee, Right hip  . Knee arthroscopy      right x 3-4, left x 2  . Knee arthrotomy  ~1991    right  . 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 Polypectomy      vocal cords 1992  . Total hip revision  04/22/2011    Procedure: TOTAL HIP REVISION;  Surgeon: Gearlean Alf, MD;  Location: [facility];  Service: Orthopedics;  Laterality: Right;  . Colonoscopy w/ polypectomy    . Lumbar fusion  04/03/2013    [name], L5-S1  . Total hip arthroplasty Left 10/21/2014    Procedure: LEFT TOTAL HIP ARTHROPLASTY ANTERIOR APPROACH;  Surgeon: [name], MD;  [facility]: [facility];   Service: Orthopedics;  Laterality: Left;    There were no vitals filed for this visit.  Visit Diagnosis:  Hip pain, acute, left  Weakness of left hip  Stiffness of left hip joint  Activity intolerance      Subjective Assessment - 01/15/15 1126    Subjective States that L hip area and ITB area is still tight. Feeling better   How long can you walk comfortably? about .25 mile; one lap around [employer] before requires cart to lean on   Patient Stated Goals to get rid of pain   Currently in Pain? Yes   Pain Score 4    Pain Location Hip   Pain Orientation Left;Lateral                         [facility] Adult PT Treatment/Exercise - 01/15/15 0001    Knee/Hip Exercises: Supine   Other Supine Knee/Hip Exercises LT HIP ER   Modalities   Modalities Electrical Stimulation;Moist Heat   Moist Heat Therapy   Number Minutes Moist Heat 15 Minutes  Moist Heat Location Hip   Electrical Stimulation   Electrical Stimulation Location L ITB origin and insertion 2 chs of premod x15 min 80-150hz  in RT sidelying    Electrical Stimulation Goals Tone;Pain   Manual Therapy   Manual Therapy Myofascial release   Myofascial Release L ITB in sidelying. IASTW to entire ITB f/b STW and ITB Lifts  to seperate tissues, patella mobs medial glides                  PT Short Term Goals - 01/05/15 1008    PT SHORT TERM GOAL #1   Title I with initial HEP   Time 2   Period Weeks   Status New           PT Long Term Goals - 01/05/15 1008    PT LONG TERM GOAL #1   Title I with advanced HEP   Time 4   Period Weeks   Status New   PT LONG TERM GOAL #2   Title decreased pain to 1/10 or less in L hip and ITB with ADLS   Time 4   Period Weeks   Status New   PT LONG TERM GOAL #3   Title patient not restricted with waking by LLE pain   Time 4   Period Weeks   Status New               Plan - 01/15/15 1157    Clinical Impression Statement Pt did fairly well with Rx today,  but still has notable tightness in LT ITB and is sore over proximal and distal areas. We also worked on Training and development officer for LT hip to improve gait pattern. Goals are ongoing   Pt will benefit from skilled therapeutic intervention in order to improve on the following deficits Decreased range of motion;Decreased activity tolerance;Pain;Impaired flexibility;Decreased strength   Rehab Potential Excellent   Clinical Impairments Affecting Rehab Potential stenosis   PT Frequency 2x / week   PT Duration 4 weeks   PT Treatment/Interventions ADLs/Self Care Home Management;Electrical Stimulation;Moist Heat;Cryotherapy;Therapeutic exercise;Ultrasound;Patient/family education;Manual techniques;Dry needling;Passive range of motion;Taping   PT Next Visit Plan Bike/Nustep; STW/MFR to L TFL and ITB, flexibility of same plus HS; modalities prn   PT Home Exercise Plan sidelying ITB stretch and supine HF stretch   Consulted and Agree with Plan of Care Patient        Problem List Patient Active Problem List   Diagnosis Date Noted  . S/P left THA, AA 10/21/2014  . S/P lumbar spinal fusion 04/03/2013  . Sinusitis, chronic 12/20/2012  . Abnormality of gait 10/31/2012  . Tremor 10/31/2012  . Cough 10/14/2012  . Extrinsic asthma, unspecified 09/27/2012  . Hip pain, right 04/22/2011  . OSA on CPAP 12/26/2006    RAMSEUR,CHRIS, PTA 01/15/2015, 12:04 PM  [facility] [address] [place], [place], [address] Phone: [phone]   Fax:  [phone]  Name: [name]. MRN: [id number] Date of Birth: Feb 06, 1946

## 2015-01-16 DIAGNOSIS — Z471 Aftercare following joint replacement surgery: Secondary | ICD-10-CM | POA: Diagnosis not present

## 2015-01-16 DIAGNOSIS — Z96642 Presence of left artificial hip joint: Secondary | ICD-10-CM | POA: Diagnosis not present

## 2015-01-19 DIAGNOSIS — E118 Type 2 diabetes mellitus with unspecified complications: Secondary | ICD-10-CM | POA: Diagnosis not present

## 2015-01-20 ENCOUNTER — Encounter: Payer: Self-pay | Admitting: *Deleted

## 2015-01-20 ENCOUNTER — Ambulatory Visit: Payer: Medicare Other | Admitting: *Deleted

## 2015-01-20 DIAGNOSIS — R29898 Other symptoms and signs involving the musculoskeletal system: Secondary | ICD-10-CM

## 2015-01-20 DIAGNOSIS — M25652 Stiffness of left hip, not elsewhere classified: Secondary | ICD-10-CM

## 2015-01-20 DIAGNOSIS — M25552 Pain in left hip: Secondary | ICD-10-CM

## 2015-01-20 DIAGNOSIS — R6889 Other general symptoms and signs: Secondary | ICD-10-CM | POA: Diagnosis not present

## 2015-01-20 NOTE — Therapy (Signed)
[facility] [place], [place], [address] Phone: [phone]   Fax:  [phone]  Physical Therapy Treatment  Patient Details  Name: [name]. MRN: [id number] Date of Birth: [birth year] Referring Provider: [name]  Encounter Date: 01/20/2015      PT End of Session - 01/20/15 0905    Visit Number 5   Number of Visits 8   Date for PT Re-Evaluation 02/02/15   PT Start Time 0900   PT Stop Time 0959   PT Time Calculation (min) 59 min      Past Medical History  Diagnosis Date  . Sleep apnea     settings 10.6  / followed by Dr [name] study years ago  . Recurrent upper respiratory infection (URI)     states Dr [name] aware- no fever- states is improving  . Arthritis   . Depression     ADD  . Tremor     d/t lithium  . Bronchitis     hx of  . Insomnia   . BPH (benign prostatic hyperplasia)   . Complication of anesthesia 9/44/96    no complications but just says he typically needs "more than expected" to anesthetize; needs CPAP (setting 10) in recovery  . Bulge of cervical disc without myelopathy   . Asthma     anxiety, tree/grass pollen  . Diabetes mellitus     PCP Dr [name], type 2    Past Surgical History  Procedure Laterality Date  . Cardiac catheterization      2008 pre op gastric bypass  . Joint replacement      Left, Right Knee, Right hip  . Knee arthroscopy      right x 3-4, left x 2  . Knee arthrotomy  ~1991    right  . 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 Polypectomy      vocal cords 1992  . Total hip revision  04/22/2011    Procedure: TOTAL HIP REVISION;  Surgeon: Ronald Alf, MD;  [facility]: [facility];  Service: Orthopedics;  Laterality: Right;  . Colonoscopy w/ polypectomy    . Lumbar fusion  04/03/2013    Dr [name], L5-S1  . Total hip arthroplasty Left 10/21/2014    Procedure: LEFT TOTAL HIP ARTHROPLASTY ANTERIOR APPROACH;  Surgeon: Ronald Cancel, MD;  [facility];   Service: Orthopedics;  Laterality: Left;    There were no vitals filed for this visit.  Visit Diagnosis:  Hip pain, acute, left  Weakness of left hip  Stiffness of left hip joint  Activity intolerance      Subjective Assessment - 01/20/15 0919    Subjective Did well after last Rx LT knee hurts a little. Sore today in LT knee and Hip. Went to the MD and he said the LT hip looks good. He agreed that the LT ITB was tight   How long can you walk comfortably? about .25 mile; one lap around [employer] before requires cart to lean on   Patient Stated Goals to get rid of pain   Currently in Pain? Yes   Pain Location Hip   Pain Orientation Left   Pain Descriptors / Indicators Sore   Pain Type Acute pain   Pain Onset More than a month ago   Pain Frequency Intermittent   Aggravating Factors  keeping knee and hip bent   Pain Relieving Factors heat   Effect of Pain on Daily Activities painful  [name] Adult PT Treatment/Exercise - 01/20/15 0001    Exercises   Exercises Knee/Hip   Knee/Hip Exercises: Aerobic   Nustep L5 x 10 mins   Modalities   Modalities Electrical Stimulation;Moist Heat   Moist Heat Therapy   Number Minutes Moist Heat 15 Minutes   Moist Heat Location Hip   Electrical Stimulation   Electrical Stimulation Location L ITB origin and insertion 2 chs of premod x15 min 80-150hz  in RT sidelying    Electrical Stimulation Goals Tone;Pain   Manual Therapy   Manual Therapy Myofascial release   Myofascial Release L ITB in sidelying. IASTW to entire ITB f/b STW and ITB Lifts  to seperate tissues, patella mobs medial glides                  PT Short Term Goals - 01/05/15 1008    PT SHORT TERM GOAL #1   Title I with initial HEP   Time 2   Period Weeks   Status New           PT Long Term Goals - 01/05/15 1008    PT LONG TERM GOAL #1   Title I with advanced HEP   Time 4   Period Weeks   Status New   PT LONG TERM GOAL  #2   Title decreased pain to 1/10 or less in L hip and ITB with ADLS   Time 4   Period Weeks   Status New   PT LONG TERM GOAL #3   Title patient not restricted with waking by LLE pain   Time 4   Period Weeks   Status New               Plan - 01/20/15 0905    Clinical Impression Statement Pt did fairly well today and was able to perform 10 mins on Nustep and did ok. Pt. did have notable tightness around ITB insertion LT knee around knee cap. He was also tender along entire ITB and at hip. Goals are ongoing. He did state MD did not remove his hip bursae   Pt will benefit from skilled therapeutic intervention in order to improve on the following deficits Decreased range of motion;Decreased activity tolerance;Pain;Impaired flexibility;Decreased strength   Rehab Potential Excellent   Clinical Impairments Affecting Rehab Potential stenosis   PT Frequency 2x / week   PT Duration 4 weeks   PT Treatment/Interventions ADLs/Self Care Home Management;Electrical Stimulation;Moist Heat;Cryotherapy;Therapeutic exercise;Ultrasound;Patient/family education;Manual techniques;Dry needling;Passive range of motion;Taping   PT Next Visit Plan Bike/Nustep; STW/MFR to L TFL and ITB, flexibility of same plus HS; modalities prn   PT Home Exercise Plan sidelying ITB stretch and supine HF stretch   Consulted and Agree with Plan of Care Patient        Problem List Patient Active Problem List   Diagnosis Date Noted  . S/P left THA, AA 10/21/2014  . S/P lumbar spinal fusion 04/03/2013  . Sinusitis, chronic 12/20/2012  . Abnormality of gait 10/31/2012  . Tremor 10/31/2012  . Cough 10/14/2012  . Extrinsic asthma, unspecified 09/27/2012  . Hip pain, right 04/22/2011  . OSA on CPAP 12/26/2006    [name],[name], PTA 01/20/2015, 1:31 PM  [facility] [address] [place], [place], [address] Phone: [phone]   Fax:  [phone]  Name: [name]. MRN: [id number] Date of Birth: 24-Dec-1945

## 2015-01-22 ENCOUNTER — Encounter: Payer: Self-pay | Admitting: *Deleted

## 2015-01-22 ENCOUNTER — Ambulatory Visit: Payer: Medicare Other | Admitting: *Deleted

## 2015-01-22 DIAGNOSIS — M25652 Stiffness of left hip, not elsewhere classified: Secondary | ICD-10-CM

## 2015-01-22 DIAGNOSIS — R29898 Other symptoms and signs involving the musculoskeletal system: Secondary | ICD-10-CM

## 2015-01-22 DIAGNOSIS — R6889 Other general symptoms and signs: Secondary | ICD-10-CM | POA: Diagnosis not present

## 2015-01-22 DIAGNOSIS — M25552 Pain in left hip: Secondary | ICD-10-CM

## 2015-01-22 NOTE — Therapy (Signed)
[facility] [place], [place], [address] Phone: [phone]   Fax:  [phone]  Physical Therapy Treatment  Patient Details  Name: [name]. MRN: [id number] Date of Birth: 1946-01-06 Referring Provider: [name]  Encounter Date: 01/22/2015      PT End of Session - 01/22/15 1003    Visit Number 6   Number of Visits 8   Date for PT Re-Evaluation 02/02/15   PT Start Time 0900   PT Stop Time 0958   PT Time Calculation (min) 58 min   Activity Tolerance Patient tolerated treatment well   Behavior During Therapy [facility] for tasks assessed/performed      Past Medical History  Diagnosis Date  . Sleep apnea     settings 10.6  / followed by [name] study years ago  . Recurrent upper respiratory infection (URI)     states Dr Wynelle Link aware- no fever- states is improving  . Arthritis   . Depression     ADD  . Tremor     d/t lithium  . Bronchitis     hx of  . Insomnia   . BPH (benign prostatic hyperplasia)   . Complication of anesthesia 07/30/82    no complications but just says he typically needs "more than expected" to anesthetize; needs CPAP (setting 10) in recovery  . Bulge of cervical disc without myelopathy   . Asthma     anxiety, tree/grass pollen  . Diabetes mellitus     PCP [name], type 2    Past Surgical History  Procedure Laterality Date  . Cardiac catheterization      2008 pre op gastric bypass  . Joint replacement      Left, Right Knee, Right hip  . Knee arthroscopy      right x 3-4, left x 2  . Knee arthrotomy  ~1991    right  . 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 Polypectomy      vocal cords 1992  . Total hip revision  04/22/2011    Procedure: TOTAL HIP REVISION;  Surgeon: Gearlean Alf, MD;  [facility];  Service: Orthopedics;  Laterality: Right;  . Colonoscopy w/ polypectomy    . Lumbar fusion  04/03/2013    [name], L5-S1  . Total hip arthroplasty Left  10/21/2014    Procedure: LEFT TOTAL HIP ARTHROPLASTY ANTERIOR APPROACH;  Surgeon: [name], MD;  [facility];  Service: Orthopedics;  Laterality: Left;    There were no vitals filed for this visit.  Visit Diagnosis:  Hip pain, acute, left  Weakness of left hip  Stiffness of left hip joint  Activity intolerance      Subjective Assessment - 01/22/15 0927    Subjective Felt good after last Rx. Hip and knee Pain from  ITB is less today. Pain is less today   Limitations Sitting                         [facility] Adult PT Treatment/Exercise - 01/22/15 0001    Exercises   Exercises Knee/Hip   Knee/Hip Exercises: Aerobic   Nustep L6 x 10 mins   Knee/Hip Exercises: Supine   Other Supine Knee/Hip Exercises LT HIP ER   Modalities   Modalities Electrical Stimulation;Moist Heat   Moist Heat Therapy   Number Minutes Moist Heat 15 Minutes   Moist Heat Location Hip   Electrical  Stimulation   Electrical Stimulation Location L ITB origin and insertion 2 chs of premod x15 min 80-$RemoveBe'150hz'[id number]$  in RT sidelying    Electrical Stimulation Goals Tone;Pain   Manual Therapy   Manual Therapy Myofascial release   Myofascial Release L ITB in sidelying. IASTW to entire ITB f/b STW and ITB Lifts  to seperate tissues, patella mobs medial glides                  PT Short Term Goals - 01/22/15 1014    PT SHORT TERM GOAL #1   Title I with initial HEP   Time 2   Period Weeks   Status Achieved           PT Long Term Goals - 01/22/15 1015    PT LONG TERM GOAL #1   Title I with advanced HEP   Period Weeks   Status On-going   PT LONG TERM GOAL #2   Title decreased pain to 1/10 or less in L hip and ITB with ADLS   Time 4   Period Weeks   Status On-going   PT LONG TERM GOAL #3   Title patient not restricted with waking by LLE pain   Time 4   Period Weeks   Status On-going               Plan - 01/22/15 1004    Clinical Impression Statement Pt did great todayu  and was able to perform 10 mins of ex again today without increased pain. He feels PT is really helping and he is able to move around better. He has met STG#1, but LTGs are ongoing   Pt will benefit from skilled therapeutic intervention in order to improve on the following deficits Decreased range of motion;Decreased activity tolerance;Pain;Impaired flexibility;Decreased strength   Rehab Potential Excellent   Clinical Impairments Affecting Rehab Potential stenosis   PT Frequency 2x / week   PT Duration 4 weeks   PT Treatment/Interventions ADLs/Self Care Home Management;Electrical Stimulation;Moist Heat;Cryotherapy;Therapeutic exercise;Ultrasound;Patient/family education;Manual techniques;Dry needling;Passive range of motion;Taping   PT Home Exercise Plan sidelying ITB stretch and supine HF stretch   Consulted and Agree with Plan of Care Patient        Problem List Patient Active Problem List   Diagnosis Date Noted  . S/P left THA, AA 10/21/2014  . S/P lumbar spinal fusion 04/03/2013  . Sinusitis, chronic 12/20/2012  . Abnormality of gait 10/31/2012  . Tremor 10/31/2012  . Cough 10/14/2012  . Extrinsic asthma, unspecified 09/27/2012  . Hip pain, right 04/22/2011  . OSA on CPAP 12/26/2006    Scottlynn Lindell,CHRIS, PTA 01/22/2015, 10:43 AM  [facility] [address] [place], [place], [address] Phone: [phone]   Fax:  [phone]  Name: [name]. MRN: [id number] Date of Birth: Aug 22, 1945

## 2015-01-26 DIAGNOSIS — M25559 Pain in unspecified hip: Secondary | ICD-10-CM | POA: Diagnosis not present

## 2015-01-26 DIAGNOSIS — Z09 Encounter for follow-up examination after completed treatment for conditions other than malignant neoplasm: Secondary | ICD-10-CM | POA: Diagnosis not present

## 2015-01-27 ENCOUNTER — Ambulatory Visit: Payer: Medicare Other | Admitting: Physical Therapy

## 2015-01-27 DIAGNOSIS — R29898 Other symptoms and signs involving the musculoskeletal system: Secondary | ICD-10-CM

## 2015-01-27 DIAGNOSIS — M25552 Pain in left hip: Secondary | ICD-10-CM

## 2015-01-27 DIAGNOSIS — R6889 Other general symptoms and signs: Secondary | ICD-10-CM | POA: Diagnosis not present

## 2015-01-27 DIAGNOSIS — M25652 Stiffness of left hip, not elsewhere classified: Secondary | ICD-10-CM | POA: Diagnosis not present

## 2015-01-27 NOTE — Therapy (Signed)
[facility] [place], [place], [address] Phone: [phone]   Fax:  [phone]  Physical Therapy Treatment  Patient Details  Name: [name]. MRN: [id number] Date of Birth: 04-25-1945 Referring Provider: [name]  Encounter Date: 01/27/2015      PT End of Session - 01/27/15 0736    Visit Number 7   Number of Visits 8   Date for PT Re-Evaluation 02/02/15   PT Start Time 0736   PT Stop Time 0827   PT Time Calculation (min) 51 min   Activity Tolerance Patient tolerated treatment well   Behavior During Therapy [facility] for tasks assessed/performed      Past Medical History  Diagnosis Date  . Sleep apnea     settings 10.6  / followed by [name] study years ago  . Recurrent upper respiratory infection (URI)     states Dr Wynelle Link aware- no fever- states is improving  . Arthritis   . Depression     ADD  . Tremor     d/t lithium  . Bronchitis     hx of  . Insomnia   . BPH (benign prostatic hyperplasia)   . Complication of anesthesia [id number]    no complications but just says he typically needs "more than expected" to anesthetize; needs CPAP (setting 10) in recovery  . Bulge of cervical disc without myelopathy   . Asthma     anxiety, tree/grass pollen  . Diabetes mellitus     PCP [name], type 2    Past Surgical History  Procedure Laterality Date  . Cardiac catheterization      2008 pre op gastric bypass  . Joint replacement      Left, Right Knee, Right hip  . Knee arthroscopy      right x 3-4, left x 2  . Knee arthrotomy  ~1991    right  . 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 Polypectomy      vocal cords 1992  . Total hip revision  04/22/2011    Procedure: TOTAL HIP REVISION;  Surgeon: Gearlean Alf, MD;  [facility];  Service: Orthopedics;  Laterality: Right;  . Colonoscopy w/ polypectomy    . Lumbar fusion  04/03/2013    [name], L5-S1  . Total hip arthroplasty Left  10/21/2014    Procedure: LEFT TOTAL HIP ARTHROPLASTY ANTERIOR APPROACH;  Surgeon: [name], MD;  [facility];  Service: Orthopedics;  Laterality: Left;    There were no vitals filed for this visit.  Visit Diagnosis:  Hip pain, acute, left  Weakness of left hip  Stiffness of left hip joint  Activity intolerance      Subjective Assessment - 01/27/15 0735    Subjective "My L hip is killing me." States that MD that sent him stated that he could try exercises at home following therapy.   Limitations Sitting   How long can you walk comfortably? about .25 mile; one lap around [employer] before requires cart to lean on   Patient Stated Goals to get rid of pain   Currently in Pain? Yes   Pain Score 6    Pain Location Hip   Pain Orientation Left   Pain Descriptors / Indicators Dull;Other (Comment)  "persistant."   Pain Type Acute pain   Pain Onset More than a month ago   Pain Frequency Constant  [facility] PT Assessment - 01/27/15 0001    Assessment   Medical Diagnosis L hip pain s/p ant replacement   Onset Date/Surgical Date 10/21/14   Prior Therapy for knees and other hip   Precautions   Precautions Anterior Hip   Precaution Comments no [place] to hip joint                     [facility] Adult PT Treatment/Exercise - 01/27/15 0001    Modalities   Modalities Electrical Stimulation;Moist Heat;Ultrasound   Moist Heat Therapy   Number Minutes Moist Heat 15 Minutes   Moist Heat Location Hip   Electrical Stimulation   Electrical Stimulation Location L superior ITB    Electrical Stimulation Action Pre-Mod  2 channels   Electrical Stimulation Parameters 80-150 Hz x15 min   Electrical Stimulation Goals Tone;Pain   Ultrasound   Ultrasound Location L mid ITB  Not over L hip joint   Ultrasound Parameters 1.5 w/cm2, 100%, 1 mhz x10 min   Ultrasound Goals Pain   Manual Therapy   Manual Therapy Myofascial release   Myofascial Release IASTW/ MFR to L mid ITB as  well as posterior hip region to decrease tightness and pain in R sidelying                  PT Short Term Goals - 01/22/15 1014    PT SHORT TERM GOAL #1   Title I with initial HEP   Time 2   Period Weeks   Status Achieved           PT Long Term Goals - 01/22/15 1015    PT LONG TERM GOAL #1   Title I with advanced HEP   Period Weeks   Status On-going   PT LONG TERM GOAL #2   Title decreased pain to 1/10 or less in L hip and ITB with ADLS   Time 4   Period Weeks   Status On-going   PT LONG TERM GOAL #3   Title patient not restricted with waking by LLE pain   Time 4   Period Weeks   Status On-going               Plan - 01/27/15 [id number]    Clinical Impression Statement Patient tolerated today's treatment fairly well although he reported increased pain today in the L hip joint region. Exercise not completed today secondary to increased L hip pain. Continues to present with increased tightness in L ITB musculature especially in the anterior and posterior fibers of the ITB. [place] was completed more over mid L ITB  Normal modalities response noted following removal  of the modalities. Experienced "slightly" decreased L hp and ITB pain following treatment.   Pt will benefit from skilled therapeutic intervention in order to improve on the following deficits Decreased range of motion;Decreased activity tolerance;Pain;Impaired flexibility;Decreased strength   Rehab Potential Excellent   Clinical Impairments Affecting Rehab Potential stenosis   PT Frequency 2x / week   PT Duration 4 weeks   PT Treatment/Interventions ADLs/Self Care Home Management;Electrical Stimulation;Moist Heat;Cryotherapy;Therapeutic exercise;Ultrasound;Patient/family education;Manual techniques;Dry needling;Passive range of motion;Taping   PT Next Visit Plan Bike/Nustep; STW/MFR to L TFL and ITB, flexibility of same plus HS; modalities prn   PT Home Exercise Plan sidelying ITB stretch and supine HF  stretch   Consulted and Agree with Plan of Care Patient        Problem List Patient Active Problem List   Diagnosis Date Noted  .  S/P left THA, AA 10/21/2014  . S/P lumbar spinal fusion 04/03/2013  . Sinusitis, chronic 12/20/2012  . Abnormality of gait 10/31/2012  . Tremor 10/31/2012  . Cough 10/14/2012  . Extrinsic asthma, unspecified 09/27/2012  . Hip pain, right 04/22/2011  . OSA on CPAP 12/26/2006    Wynelle Fanny, PTA 01/27/2015, 9:06 AM  [facility] [address] [place], [place], [address] Phone: [phone]   Fax:  [phone]  Name: Berney Puett. MRN: [id number] Date of Birth: 1945-12-01

## 2015-01-29 ENCOUNTER — Ambulatory Visit: Payer: Medicare Other | Admitting: *Deleted

## 2015-01-29 ENCOUNTER — Encounter: Payer: Self-pay | Admitting: *Deleted

## 2015-01-29 DIAGNOSIS — R6889 Other general symptoms and signs: Secondary | ICD-10-CM

## 2015-01-29 DIAGNOSIS — R29898 Other symptoms and signs involving the musculoskeletal system: Secondary | ICD-10-CM | POA: Diagnosis not present

## 2015-01-29 DIAGNOSIS — M25552 Pain in left hip: Secondary | ICD-10-CM

## 2015-01-29 DIAGNOSIS — M25652 Stiffness of left hip, not elsewhere classified: Secondary | ICD-10-CM

## 2015-01-29 NOTE — Therapy (Addendum)
[facility] [address] [place], [place], [address] Phone: [phone]   Fax:  [phone]  Physical Therapy Treatment  Patient Details  Name: [name]. MRN: [id number] Date of Birth: 01-Aug-1945 Referring Provider: Darci Needle MD  Encounter Date: 01/29/2015      PT End of Session - 01/29/15 0919    Visit Number 8   Number of Visits 8   Date for PT Re-Evaluation 02/02/15   PT Start Time 0900   PT Stop Time 0950   PT Time Calculation (min) 50 min      Past Medical History  Diagnosis Date  . Sleep apnea     settings 10.6  / followed by Dr Lennie Hummer study years ago  . Recurrent upper respiratory infection (URI)     states Dr Lequita Halt aware- no fever- states is improving  . Arthritis   . Depression     ADD  . Tremor     d/t lithium  . Bronchitis     hx of  . Insomnia   . BPH (benign prostatic hyperplasia)   . Complication of anesthesia 04/01/13    no complications but just says he typically needs "more than expected" to anesthetize; needs CPAP (setting 10) in recovery  . Bulge of cervical disc without myelopathy   . Asthma     anxiety, tree/grass pollen  . Diabetes mellitus     PCP Dr Juleen China, type 2    Past Surgical History  Procedure Laterality Date  . Cardiac catheterization      2008 pre op gastric bypass  . Joint replacement      Left, Right Knee, Right hip  . Knee arthroscopy      right x 3-4, left x 2  . Knee arthrotomy  ~1991    right  . 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 Polypectomy      vocal cords 1992  . Total hip revision  04/22/2011    Procedure: TOTAL HIP REVISION;  Surgeon: Loanne Drilling, MD;  Location: [facility];  Service: Orthopedics;  Laterality: Right;  . Colonoscopy w/ polypectomy    . Lumbar fusion  04/03/2013    Dr Yetta Barre, L5-S1  . Total hip arthroplasty Left 10/21/2014    Procedure: LEFT TOTAL HIP ARTHROPLASTY ANTERIOR APPROACH;  Surgeon: Durene Romans, MD;  Location: [facility];   Service: Orthopedics;  Laterality: Left;    There were no vitals filed for this visit.  Visit Diagnosis:  Hip pain, acute, left  Weakness of left hip  Stiffness of left hip joint  Activity intolerance      Subjective Assessment - 01/29/15 0904    Subjective LT hip is doing better. 4/10   Limitations Sitting   How long can you walk comfortably? about .25 mile; one lap around [employer] before requires cart to lean on   Patient Stated Goals to get rid of pain   Currently in Pain? Yes   Pain Score 4    Pain Location Hip   Pain Orientation Left   Pain Descriptors / Indicators Dull   Pain Onset More than a month ago   Pain Frequency Constant   Pain Relieving Factors Rxs                         [facility] Adult PT Treatment/Exercise - 01/29/15 0001    Exercises   Exercises Knee/Hip   Knee/Hip Exercises: Aerobic  Nustep L6 x 10 mins   Modalities   Modalities Electrical Stimulation;Moist Heat;Ultrasound   Moist Heat Therapy   Number Minutes Moist Heat 15 Minutes   Moist Heat Location Hip   Electrical Stimulation   Electrical Stimulation Location L superior ITB    Electrical Stimulation Action premod80--150 hz  x 15 mins   Electrical Stimulation Goals Tone;Pain   Manual Therapy   Manual Therapy Myofascial release   Myofascial Release IASTW/ MFR to L mid ITB as well as posterior hip region to decrease tightness and pain in R sidelying, TPR to LT TFL                 PT Education - Feb 19, 2015 1334    Education provided Yes   Education Details Hip ABD in sidelying and standing,  TPR to LT TFL           PT Short Term Goals - 01/22/15 1014    PT SHORT TERM GOAL #1   Title I with initial HEP   Time 2   Period Weeks   Status Achieved           PT Long Term Goals - 02/19/2015 3976    PT LONG TERM GOAL #1   Title I with advanced HEP   Time 4   Period Weeks   Status Achieved   PT LONG TERM GOAL #2   Title decreased pain to 1/10 or less in L hip  and ITB with ADLS   Time 4   Period Weeks   Status Not Met   PT LONG TERM GOAL #3   Title patient not restricted with waking by LLE pain   Time 4   Period Weeks   Status Not Met               Plan - 02-19-2015 1331    Clinical Impression Statement Pt did fairly well with Rx today and is independent in HEP for LT hip strengthening and ITB stretching. He was able to meet LTG #1, but not others due to pain   Pt will benefit from skilled therapeutic intervention in order to improve on the following deficits Decreased range of motion;Decreased activity tolerance;Pain;Impaired flexibility;Decreased strength   Rehab Potential Excellent   Clinical Impairments Affecting Rehab Potential stenosis   PT Frequency 2x / week   PT Duration 4 weeks   PT Treatment/Interventions ADLs/Self Care Home Management;Electrical Stimulation;Moist Heat;Cryotherapy;Therapeutic exercise;Ultrasound;Patient/family education;Manual techniques;Dry needling;Passive range of motion;Taping   PT Next Visit Plan DC to HEP   Consulted and Agree with Plan of Care Patient          G-Codes - 02/19/2015 1328    Functional Assessment Tool Used DC FOTO 49% limitation   Functional Limitation Mobility: Walking and moving around   Mobility: Walking and Moving Around Current Status [phone]) At least 40 percent but less than 60 percent impaired, limited or restricted   Mobility: Walking and Moving Around Goal Status [phone]) At least 40 percent but less than 60 percent impaired, limited or restricted   Mobility: Walking and Moving Around Discharge Status [phone]) At least 40 percent but less than 60 percent impaired, limited or restricted      Problem List Patient Active Problem List   Diagnosis Date Noted  . S/P left THA, AA 10/21/2014  . S/P lumbar spinal fusion 04/03/2013  . Sinusitis, chronic 12/20/2012  . Abnormality of gait 10/31/2012  . Tremor 10/31/2012  . Cough 10/14/2012  . Extrinsic asthma, unspecified  09/27/2012  .  Hip pain, right 04/22/2011  . OSA on CPAP 12/26/2006   PHYSICAL THERAPY DISCHARGE SUMMARY  Visits from Start of Care: 8  Current functional level related to goals / functional outcomes: Please see above.   Remaining deficits: Continued pain.   Education / Equipment: HEP. Plan: Patient agrees to discharge.  Patient goals were partially met. Patient is being discharged due to meeting the stated rehab goals.  ?????      Carmin Dibartolo, [place] MPT 01/29/2015, 6:35 PM  [facility] [address] [place], [place], [address] Phone: [phone]   Fax:  [phone]  Name: [name]. MRN: [id number] Date of Birth: 1945-06-25

## 2015-01-29 NOTE — Therapy (Signed)
[facility] [address] [place], [place], [address] Phone: [phone]   Fax:  [phone]  Physical Therapy Treatment  Patient Details  Name: [name]. MRN: [id number] Date of Birth: 1945-06-03 Referring Provider: Darci Needle MD  Encounter Date: 01/29/2015      PT End of Session - 01/29/15 0919    Visit Number 8   Number of Visits 8   Date for PT Re-Evaluation 02/02/15   PT Start Time 0900   PT Stop Time 0950   PT Time Calculation (min) 50 min      Past Medical History  Diagnosis Date  . Sleep apnea     settings 10.6  / followed by Dr Lennie Hummer study years ago  . Recurrent upper respiratory infection (URI)     states Dr Lequita Halt aware- no fever- states is improving  . Arthritis   . Depression     ADD  . Tremor     d/t lithium  . Bronchitis     hx of  . Insomnia   . BPH (benign prostatic hyperplasia)   . Complication of anesthesia 04/01/13    no complications but just says he typically needs "more than expected" to anesthetize; needs CPAP (setting 10) in recovery  . Bulge of cervical disc without myelopathy   . Asthma     anxiety, tree/grass pollen  . Diabetes mellitus     PCP Dr Juleen China, type 2    Past Surgical History  Procedure Laterality Date  . Cardiac catheterization      2008 pre op gastric bypass  . Joint replacement      Left, Right Knee, Right hip  . Knee arthroscopy      right x 3-4, left x 2  . Knee arthrotomy  ~1991    right  . 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 Polypectomy      vocal cords 1992  . Total hip revision  04/22/2011    Procedure: TOTAL HIP REVISION;  Surgeon: Loanne Drilling, MD;  [facility]: [facility];  Service: Orthopedics;  Laterality: Right;  . Colonoscopy w/ polypectomy    . Lumbar fusion  04/03/2013    Dr Yetta Barre, L5-S1  . Total hip arthroplasty Left 10/21/2014    Procedure: LEFT TOTAL HIP ARTHROPLASTY ANTERIOR APPROACH;  Surgeon: Durene Romans, MD;  [facility];   Service: Orthopedics;  Laterality: Left;    There were no vitals filed for this visit.  Visit Diagnosis:  Hip pain, acute, left  Weakness of left hip  Stiffness of left hip joint  Activity intolerance      Subjective Assessment - 01/29/15 0904    Subjective LT hip is doing better. 4/10   Limitations Sitting   How long can you walk comfortably? about .25 mile; one lap around [employer] before requires cart to lean on   Patient Stated Goals to get rid of pain   Currently in Pain? Yes   Pain Score 4    Pain Location Hip   Pain Orientation Left   Pain Descriptors / Indicators Dull   Pain Onset More than a month ago   Pain Frequency Constant   Pain Relieving Factors Rxs                         [facility] Adult PT Treatment/Exercise - 01/29/15 0001    Exercises   Exercises Knee/Hip   Knee/Hip Exercises: Aerobic  Nustep L6 x 10 mins   Modalities   Modalities Electrical Stimulation;Moist Heat;Ultrasound   Moist Heat Therapy   Number Minutes Moist Heat 15 Minutes   Moist Heat Location Hip   Electrical Stimulation   Electrical Stimulation Location L superior ITB    Electrical Stimulation Action premod80--150 hz  x 15 mins   Electrical Stimulation Goals Tone;Pain   Manual Therapy   Manual Therapy Myofascial release   Myofascial Release IASTW/ MFR to L mid ITB as well as posterior hip region to decrease tightness and pain in R sidelying, TPR to LT TFL                                                                                                    Hip abduction in sidelying and standing            PT Education - 02-27-15 1334    Education provided Yes   Education Details Hip ABD in sidelying and standing,  TPR to LT TFL           PT Short Term Goals - 01/22/15 1014    PT SHORT TERM GOAL #1   Title I with initial HEP   Time 2   Period Weeks   Status Achieved           PT Long Term Goals - 02/27/15 4709    PT LONG TERM GOAL #1   Title I with  advanced HEP   Time 4   Period Weeks   Status Achieved   PT LONG TERM GOAL #2   Title decreased pain to 1/10 or less in L hip and ITB with ADLS   Time 4   Period Weeks   Status Not Met   PT LONG TERM GOAL #3   Title patient not restricted with waking by LLE pain   Time 4   Period Weeks   Status Not Met               Plan - 02-27-15 1331    Clinical Impression Statement Pt did fairly well with Rx today and is independent in HEP for LT hip strengthening and ITB stretching. He was able to meet LTG #1, but not others due to pain   Pt will benefit from skilled therapeutic intervention in order to improve on the following deficits Decreased range of motion;Decreased activity tolerance;Pain;Impaired flexibility;Decreased strength   Rehab Potential Excellent   Clinical Impairments Affecting Rehab Potential stenosis   PT Frequency 2x / week   PT Duration 4 weeks   PT Treatment/Interventions ADLs/Self Care Home Management;Electrical Stimulation;Moist Heat;Cryotherapy;Therapeutic exercise;Ultrasound;Patient/family education;Manual techniques;Dry needling;Passive range of motion;Taping   PT Next Visit Plan DC to HEP   Consulted and Agree with Plan of Care Patient          G-Codes - 27-Feb-2015 1328    Functional Assessment Tool Used DC FOTO 49% limitation      Problem List Patient Active Problem List   Diagnosis Date Noted  . S/P left THA, AA 10/21/2014  . S/P lumbar spinal fusion 04/03/2013  . Sinusitis, chronic  12/20/2012  . Abnormality of gait 10/31/2012  . Tremor 10/31/2012  . Cough 10/14/2012  . Extrinsic asthma, unspecified 09/27/2012  . Hip pain, right 04/22/2011  . OSA on CPAP 12/26/2006    RAMSEUR,CHRIS, PTA 01/29/2015, 1:35 PM  [facility] [address] [place], [place], [address] Phone: [phone]   Fax:  [phone]  Name: [name]. MRN: [id number] Date of Birth: [birth year]

## 2015-02-09 DIAGNOSIS — G894 Chronic pain syndrome: Secondary | ICD-10-CM | POA: Diagnosis not present

## 2015-02-09 DIAGNOSIS — M961 Postlaminectomy syndrome, not elsewhere classified: Secondary | ICD-10-CM | POA: Diagnosis not present

## 2015-02-09 DIAGNOSIS — Z471 Aftercare following joint replacement surgery: Secondary | ICD-10-CM | POA: Diagnosis not present

## 2015-02-09 DIAGNOSIS — Z96642 Presence of left artificial hip joint: Secondary | ICD-10-CM | POA: Diagnosis not present

## 2015-02-18 DIAGNOSIS — M4316 Spondylolisthesis, lumbar region: Secondary | ICD-10-CM | POA: Diagnosis not present

## 2015-02-18 DIAGNOSIS — M961 Postlaminectomy syndrome, not elsewhere classified: Secondary | ICD-10-CM | POA: Diagnosis not present

## 2015-02-18 DIAGNOSIS — M549 Dorsalgia, unspecified: Secondary | ICD-10-CM | POA: Diagnosis not present

## 2015-02-18 DIAGNOSIS — M5136 Other intervertebral disc degeneration, lumbar region: Secondary | ICD-10-CM | POA: Diagnosis not present

## 2015-02-18 DIAGNOSIS — M4716 Other spondylosis with myelopathy, lumbar region: Secondary | ICD-10-CM | POA: Diagnosis not present

## 2015-03-02 ENCOUNTER — Other Ambulatory Visit: Payer: Self-pay | Admitting: Orthopaedic Surgery

## 2015-03-02 DIAGNOSIS — M4316 Spondylolisthesis, lumbar region: Secondary | ICD-10-CM

## 2015-03-03 ENCOUNTER — Ambulatory Visit
Admission: RE | Admit: 2015-03-03 | Discharge: 2015-03-03 | Disposition: A | Discharge status: Home / Self Care | Payer: Medicare Other | Source: Ambulatory Visit | Attending: Orthopaedic Surgery | Admitting: Orthopaedic Surgery

## 2015-03-03 DIAGNOSIS — M47816 Spondylosis without myelopathy or radiculopathy, lumbar region: Secondary | ICD-10-CM | POA: Diagnosis not present

## 2015-03-03 DIAGNOSIS — M4316 Spondylolisthesis, lumbar region: Secondary | ICD-10-CM

## 2015-03-17 ENCOUNTER — Ambulatory Visit (INDEPENDENT_AMBULATORY_CARE_PROVIDER_SITE_OTHER): Payer: Medicare Other | Admitting: Neurology

## 2015-03-17 ENCOUNTER — Encounter: Payer: Self-pay | Admitting: Neurology

## 2015-03-17 VITALS — BP 126/80 | HR 74 | Ht 74.0 in | Wt 248.0 lb

## 2015-03-17 DIAGNOSIS — E1142 Type 2 diabetes mellitus with diabetic polyneuropathy: Secondary | ICD-10-CM

## 2015-03-17 DIAGNOSIS — R251 Tremor, unspecified: Secondary | ICD-10-CM

## 2015-03-17 MED ORDER — PRIMIDONE 50 MG PO TABS: 50.0000 mg | ORAL_TABLET | Freq: Three times a day (TID) | ORAL | Status: DC

## 2015-03-17 NOTE — Progress Notes (Signed)
Subjective:   [name]. was seen in consultation in the movement disorder clinic at the request of Dwan Bolt, MD.  The evaluation is for tremor.  Pt is accompanied by his wife who supplements the history.  The patient has previously seen Dr. Krista Blue and Dr. Janann Colonel.   He has seen Dr. Krista Blue since 2011.  Records that were made available to me were reviewed.  The patient has had tremor since approximately 2008.  Pt reports that it initially started just in his fingers but has progressed over time to involve the hands and legs.  It is bilateral.  His tremor has been felt to due to psychiatric medications such as lithium, Depakote (off now), and methylphenidate.  He had been tried on propranolol in the past, but ultimately developed asthma and the propranolol had to be discontinued.  He was then off of tremor medications for many years.  He started seeing Dr. Janann Colonel in June, 2015 for the only visit that he saw him and was placed on primidone.  He is currently on primidone 50 mg daily.  He does think that it helps but has read pt pamphlet and worries it will exacerbate depression.  He can write in cursive now that he has primidone.  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   08/12/14 update:  Pt is present today for f/u.  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  Is on primidone for tremor control and this was increased to 50 mg bid last visit.  Pt states that the increase did help, especially the amplitude of the tremor but he states that perhaps it hasn't changed the frequency of the tremor.  "My cursive signature is far better."  He states that he has cut down on his lithium and thinks that his mood is the same.  His psychiatrist left and he has to change care. Pt wants off of the lithium and thinks that he doesn't need it.  He continues to worry about the fact his old head injuries contribute to tremor, along with loss of memory.  His wife is  currently in [place] for 3 weeks but his wifes sister is checking on him.  He worries about that.    03/17/15 update:  The patient is following up today in regards to tremor, which has been presumed secondary to lithium.  He reports that he found a new psychiatrist and he is off of the lithium now.  He was bothered by the tremor last visit and wanted to increase his primidone (was on lithium then).  He is therefore on 100 mg in the morning and 50 mg at night.   He states that "three tablets worked perfectly."  He states that he can write cursive now and was able to resume photophography.  He has no SE and needs RF today.   I have reviewed records since last visit.  He had a left total hip replacement in August, 2016.  He states that this replacement was "easier" than the opposite side.  He is now seeing neurosx and he just had an MRI and is wondering if he will need back surgery again.    Current/Previously tried tremor medications: propranolol (developed asthma and had to d/c); on primidone  Current medications that may exacerbate tremor:  Lithium, albuterol, methylphenidate  Outside reports reviewed: historical medical records and referral letter/letters.  Allergies  Allergen Reactions  . Demerol [Meperidine] Itching  .  Lamotrigine     welts  . Nsaids Nausea Only    Gastric bypass    Outpatient Encounter Prescriptions as of 03/17/2015  Medication Sig  . acetaminophen (TYLENOL) 325 MG tablet Take 1-2 tablets (325-650 mg total) by mouth every 6 (six) hours as needed for mild pain or moderate pain.  Marland Kitchen albuterol (PROVENTIL HFA;VENTOLIN HFA) 108 (90 BASE) MCG/ACT inhaler Inhale 2 puffs into the lungs every 6 (six) hours as needed for wheezing or shortness of breath.  . ALPRAZolam (XANAX) 0.5 MG tablet Take 0.5 mg by mouth 3 (three) times daily as needed for anxiety.  . budesonide-formoterol (SYMBICORT) 160-4.5 MCG/ACT inhaler Inhale 1 puff into the lungs 2 (two) times daily.   Marland Kitchen docusate sodium  (COLACE) 100 MG capsule Take 1 capsule (100 mg total) by mouth 2 (two) times daily.  Marland Kitchen dutasteride (AVODART) 0.5 MG capsule Take 0.5 mg by mouth daily.  Marland Kitchen HYDROcodone-acetaminophen (NORCO) 10-325 MG per tablet Take 1-2 tablets by mouth every 6 (six) hours as needed for moderate pain.  . metFORMIN (GLUCOPHAGE) 500 MG tablet Take 1,000 mg by mouth 2 (two) times daily with a meal.  . omeprazole (PRILOSEC) 20 MG capsule Take 20 mg by mouth 2 (two) times daily.   . pioglitazone (ACTOS) 45 MG tablet Take 45 mg by mouth daily.   . primidone (MYSOLINE) 50 MG tablet Take 1 tablet (50 mg total) by mouth 3 (three) times daily.  . sertraline (ZOLOFT) 100 MG tablet Take 200 mg by mouth daily.   . tamsulosin (FLOMAX) 0.4 MG CAPS capsule Take 0.4 mg by mouth daily.   Marland Kitchen tiZANidine (ZANAFLEX) 4 MG tablet Take 1 tablet (4 mg total) by mouth every 6 (six) hours as needed for muscle spasms.  . [DISCONTINUED] primidone (MYSOLINE) 50 MG tablet Take 1 tablet (50 mg total) by mouth 3 (three) times daily.  . [DISCONTINUED] ferrous sulfate 325 (65 FE) MG tablet Take 1 tablet (325 mg total) by mouth 3 (three) times daily after meals. (Patient not taking: Reported on 01/05/2015)  . [DISCONTINUED] lithium carbonate (ESKALITH) 450 MG CR tablet Take 450 mg by mouth at bedtime.   . [DISCONTINUED] oxyCODONE 10 MG TABS Take 1-2 tablets (10-20 mg total) by mouth every 4 (four) hours as needed for severe pain. (Patient not taking: Reported on 01/05/2015)  . [DISCONTINUED] polyethylene glycol (MIRALAX / GLYCOLAX) packet Take 17 g by mouth 2 (two) times daily. (Patient not taking: Reported on 01/05/2015)   No facility-administered encounter medications on file as of 03/17/2015.    Past Medical History  Diagnosis Date  . Sleep apnea     settings 10.6  / followed by [name] study years ago  . Recurrent upper respiratory infection (URI)     states Dr Wynelle Link aware- no fever- states is improving  . Arthritis   . Depression      ADD  . Tremor     d/t lithium  . Bronchitis     hx of  . Insomnia   . BPH (benign prostatic hyperplasia)   . Complication of anesthesia [id number]    no complications but just says he typically needs "more than expected" to anesthetize; needs CPAP (setting 10) in recovery  . Bulge of cervical disc without myelopathy   . Asthma     anxiety, tree/grass pollen  . Diabetes mellitus     PCP [name], type 2    Past Surgical History  Procedure Laterality Date  . Cardiac catheterization  2008 pre op gastric bypass  . Joint replacement      Left, Right Knee, Right hip  . Knee arthroscopy      right x 3-4, left x 2  . Knee arthrotomy  ~1991    right  . 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 Polypectomy      vocal cords 1992  . Total hip revision  04/22/2011    Procedure: TOTAL HIP REVISION;  Surgeon: Gearlean Alf, MD;  Location: [facility];  Service: Orthopedics;  Laterality: Right;  . Colonoscopy w/ polypectomy    . Lumbar fusion  04/03/2013    [name], L5-S1  . Total hip arthroplasty Left 10/21/2014    Procedure: LEFT TOTAL HIP ARTHROPLASTY ANTERIOR APPROACH;  Surgeon: [name], MD;  Location: [facility];  Service: Orthopedics;  Laterality: Left;    Social History   Social History  . Marital Status: Married    Spouse Name: N/A  . Number of Children: N/A  . Years of Education: college   Occupational History  . Retired Mathmetician    Social History Main Topics  . Smoking status: Former Smoker -- 1.50 packs/day for 20 years    Types: Cigarettes    Quit date: 04/17/1990  . Smokeless tobacco: Never Used  . Alcohol Use: No  . Drug Use: No  . Sexual Activity: Not on file   Other Topics Concern  . Not on file   Social History Narrative   Patient works for [employer]. Patient has masters degree. Patient is married.    Family Status  Relation Status Death Age  . Father Deceased     heart disease  . Mother Deceased     chronic depression    Review of Systems   A  complete 10 system ROS was obtained and was negative apart from what is mentioned.   Objective:   VITALS:   Filed Vitals:   03/17/15 0822  BP: 126/80  Pulse: 74  Height: 6\' 2"  (1.88 m)  Weight: 248 lb (112.492 kg)   Gen:  Appears stated age and in NAD. HEENT:  Normocephalic, atraumatic. The mucous membranes are moist. The superficial temporal arteries are without ropiness or tenderness. Cardiovascular: Regular rate and rhythm. Lungs: Clear to auscultation bilaterally. Neck: There are no carotid bruits noted bilaterally.  NEUROLOGICAL:  Orientation:  The patient is alert and oriented x 3.  He is verbose and has trouble focusing and concentrating on the topic at hand. Cranial nerves: There is good facial symmetry. Extraocular muscles are intact and visual fields are full to confrontational testing. Speech is fluent and clear. Soft palate rises symmetrically and there is no tongue deviation. Hearing is intact to conversational tone. Tone: Tone is good throughout. Sensation: Sensation is intact to light touch throughout. Coordination:  The patient no dysmetria.  No trouble with hand opening/closing, finger taps, toe taps, heel taps bilaterally.  Minimal trouble with alternation supination/pronation of forearm on the left only. Motor: Strength is 5/5 in the bilateral upper and lower extremities.  Shoulder shrug is equal bilaterally.  There is no pronator drift.  There are no fasciculations noted.   MOVEMENT EXAM: Tremor:  There is no tremor in the UE.  The patient is able to draw Archimedes spirals without significant difficulty.  He is able to write words without trouble.       Lab Results  Component Value Date   HGBA1C * 04/18/2009    8.1 (NOTE) The ADA  recommends the following therapeutic goal for glycemic control related to Hgb A1c measurement: Goal of therapy: <6.5 Hgb A1c  Reference: American Diabetes Association: Clinical Practice Recommendations 2010, Diabetes Care, 2010, 33:  (Suppl  1).   Lab Results  Component Value Date   NA 136 10/22/2014   BUN 11 10/22/2014   CREATININE 0.66 10/22/2014   WBC 6.2 10/22/2014    Assessment/Plan:   1.   Tremor.  -He is now off of lithium and methylphenidate.  Told him he could likely get off of primidone as well then but he was really nervous about that.  Told him that I really think that tremor was a consequence of lithium.  He asked me to write for the 3 a day of primidone still but said that he would try to back down to 2 a day.  I told him that next time, I will likely wean it further and perhaps wean it off as I think he would be surprised that he likely would be tremor free.  Much greater than 50% of this visit was spent in counseling with the patient. 2.  Diabetic PN  - Talked about importance of diabetic control as he is not watching diet.  Talked about diet/exercise.   3. Follow up in next 6 months, sooner should new neuro issues arise.      Total face to face time:  25 min

## 2015-03-25 DIAGNOSIS — M4316 Spondylolisthesis, lumbar region: Secondary | ICD-10-CM | POA: Diagnosis not present

## 2015-03-25 DIAGNOSIS — M5136 Other intervertebral disc degeneration, lumbar region: Secondary | ICD-10-CM | POA: Diagnosis not present

## 2015-03-25 DIAGNOSIS — M961 Postlaminectomy syndrome, not elsewhere classified: Secondary | ICD-10-CM | POA: Diagnosis not present

## 2015-04-09 DIAGNOSIS — M47816 Spondylosis without myelopathy or radiculopathy, lumbar region: Secondary | ICD-10-CM | POA: Diagnosis not present

## 2015-04-09 DIAGNOSIS — M5136 Other intervertebral disc degeneration, lumbar region: Secondary | ICD-10-CM | POA: Diagnosis not present

## 2015-04-09 DIAGNOSIS — M4316 Spondylolisthesis, lumbar region: Secondary | ICD-10-CM | POA: Diagnosis not present

## 2015-04-22 DIAGNOSIS — M47816 Spondylosis without myelopathy or radiculopathy, lumbar region: Secondary | ICD-10-CM | POA: Diagnosis not present

## 2015-05-12 DIAGNOSIS — Z79891 Long term (current) use of opiate analgesic: Secondary | ICD-10-CM | POA: Diagnosis not present

## 2015-05-12 DIAGNOSIS — G894 Chronic pain syndrome: Secondary | ICD-10-CM | POA: Diagnosis not present

## 2015-05-12 DIAGNOSIS — M961 Postlaminectomy syndrome, not elsewhere classified: Secondary | ICD-10-CM | POA: Diagnosis not present

## 2015-05-13 DIAGNOSIS — M47816 Spondylosis without myelopathy or radiculopathy, lumbar region: Secondary | ICD-10-CM | POA: Diagnosis not present

## 2015-05-19 ENCOUNTER — Encounter: Payer: Self-pay | Admitting: Family Medicine

## 2015-05-19 ENCOUNTER — Ambulatory Visit (INDEPENDENT_AMBULATORY_CARE_PROVIDER_SITE_OTHER): Payer: Medicare Other | Admitting: Family Medicine

## 2015-05-19 VITALS — BP 144/91 | HR 65 | Temp 97.7°F | Ht 74.0 in | Wt 261.4 lb

## 2015-05-19 DIAGNOSIS — B370 Candidal stomatitis: Secondary | ICD-10-CM | POA: Diagnosis not present

## 2015-05-19 DIAGNOSIS — B3781 Candidal esophagitis: Secondary | ICD-10-CM | POA: Diagnosis not present

## 2015-05-19 MED ORDER — BUDESONIDE-FORMOTEROL FUMARATE 160-4.5 MCG/ACT IN AERO: 1.0000 | INHALATION_SPRAY | Freq: Two times a day (BID) | RESPIRATORY_TRACT | Status: DC

## 2015-05-19 NOTE — Progress Notes (Signed)
Primary Physician: Wardell Honour, MD  Chief Complaint: 70 year old gentleman with sore throat described as burning with laryngitis. He felt like his throat was closing several nights ago and used some TheraFlu to gargle. Recently he was changed to [facility] after he had been on Symbicort for some time for asthma. There are different steroid formulations in these 2 products and he is thinking that the fluticasone as opposed to budesonide caused the symptoms. He says that he rinses his mouth out regularly after using either product.     Past Medical History  Diagnosis Date  . Sleep apnea     settings 10.6  / followed by [name] study years ago  . Recurrent upper respiratory infection (URI)     states Dr Wynelle Link aware- no fever- states is improving  . Arthritis   . Depression     ADD  . Tremor     d/t lithium  . Bronchitis     hx of  . Insomnia   . BPH (benign prostatic hyperplasia)   . Complication of anesthesia [id number]    no complications but just says he typically needs "more than expected" to anesthetize; needs CPAP (setting 10) in recovery  . Bulge of cervical disc without myelopathy   . Asthma     anxiety, tree/grass pollen  . Diabetes mellitus     PCP [name], type 2     Home Meds: Prior to Admission medications   Medication Sig Start Date End Date Taking? Authorizing Provider  albuterol (PROVENTIL HFA;VENTOLIN HFA) 108 (90 BASE) MCG/ACT inhaler Inhale 2 puffs into the lungs every 6 (six) hours as needed for wheezing or shortness of breath.   Yes Historical Provider, MD  dutasteride (AVODART) 0.5 MG capsule Take 0.5 mg by mouth daily.   Yes Historical Provider, MD  fluticasone furoate-vilanterol (BREO ELLIPTA) 100-25 MCG/INH AEPB Inhale 1 puff into the lungs daily.   Yes Historical Provider, MD  HYDROcodone-acetaminophen (NORCO) 10-325 MG per tablet Take 1-2 tablets by mouth every 6 (six) hours as needed for moderate pain. 04/05/13  Yes Eustace Moore, MD  metFORMIN  (GLUCOPHAGE) 500 MG tablet Take 1,000 mg by mouth 2 (two) times daily with a meal.   Yes Historical Provider, MD  omeprazole (PRILOSEC) 20 MG capsule Take 20 mg by mouth 2 (two) times daily.    Yes Historical Provider, MD  pioglitazone (ACTOS) 45 MG tablet Take 45 mg by mouth daily.  07/15/14  Yes Historical Provider, MD  primidone (MYSOLINE) 50 MG tablet Take 1 tablet (50 mg total) by mouth 3 (three) times daily. 03/17/15  Yes Rebecca S Tat, DO  sertraline (ZOLOFT) 100 MG tablet Take 200 mg by mouth daily.    Yes Historical Provider, MD  tamsulosin (FLOMAX) 0.4 MG CAPS capsule Take 0.4 mg by mouth daily.    Yes Historical Provider, MD  tiZANidine (ZANAFLEX) 4 MG tablet Take 1 tablet (4 mg total) by mouth every 6 (six) hours as needed for muscle spasms. 10/21/14  Yes Danae Orleans, PA-C  acetaminophen (TYLENOL) 325 MG tablet Take 1-2 tablets (325-650 mg total) by mouth every 6 (six) hours as needed for mild pain or moderate pain. Patient not taking: Reported on 05/19/2015 10/22/14   Danae Orleans, PA-C  ALPRAZolam Duanne Moron) 0.5 MG tablet Take 0.5 mg by mouth 3 (three) times daily as needed for anxiety. Reported on 05/19/2015    Historical Provider, MD  budesonide-formoterol (SYMBICORT) 160-4.5 MCG/ACT inhaler Inhale 1 puff into the lungs 2 (two) times  daily. Reported on 05/19/2015    Historical Provider, MD    Allergies:  Allergies  Allergen Reactions  . Demerol [Meperidine] Itching  . Lamotrigine     welts  . Nsaids Nausea Only    Gastric bypass    Social History   Social History  . Marital Status: Married    Spouse Name: N/A  . Number of Children: N/A  . Years of Education: college   Occupational History  . Retired Mathmetician    Social History Main Topics  . Smoking status: Former Smoker -- 1.50 packs/day for 20 years    Types: Cigarettes    Quit date: 04/17/1990  . Smokeless tobacco: Never Used  . Alcohol Use: No  . Drug Use: No  . Sexual Activity: Not on file   Other Topics  Concern  . Not on file   Social History Narrative   Patient works for [employer]. Patient has masters degree. Patient is married.     Review of Systems: Constitutional: negative for chills, fever, night sweats, weight changes, or fatigue  HEENT: negative for vision changes, hearing loss, congestion, rhinorrhea, ST, epistaxis, or sinus pressure; complains of sore throat hoarseness and burning on his tongue Cardiovascular: negative for chest pain or palpitations Respiratory: negative for hemoptysis, wheezing, shortness of breath, or cough Abdominal: negative for abdominal pain, nausea, vomiting, diarrhea, or constipation Dermatological: negative for rash Neurologic: negative for headache, dizziness, or syncope All other systems reviewed and are otherwise negative with the exception to those above and in the HPI.   Physical Exam: Blood pressure 144/91, pulse 65, temperature 97.7 F (36.5 C), temperature source Oral, height 6\' 2"  (1.88 m), weight 261 lb 6.4 oz (118.57 kg)., Body mass index is 33.55 kg/(m^2). General: Well developed, well nourished, in no acute distress. Head: Normocephalic, atraumatic, eyes without discharge, sclera non-icteric, nares are without discharge. Bilateral auditory canals clear, TM's are without perforation, pearly grey and translucent with reflective cone of light bilaterally. Oral cavity moist, there are some white patches around his tongue but there is minimal inflammation.  Neck: Supple. No thyromegaly. Full ROM. No lymphadenopathy. Lungs: Clear bilaterally to auscultation without wheezes, rales, or rhonchi. Breathing is unlabored. Heart: RRR with S1 S2. No murmurs, rubs, or gallops appreciated. Abdomen: Soft, non-tender, non-distended with normoactive bowel sounds. No hepatomegaly. No rebound/guarding. No obvious abdominal masses. Msk:  Strength and tone normal for age. Extremities/Skin: Warm and dry. No clubbing or cyanosis. No edema. No rashes or suspicious  lesions. Neuro: Alert and oriented X 3. Moves all extremities spontaneously. Gait is normal. CNII-XII grossly in tact. Psych:  Responds to questions appropriately with a normal affect.   Labs:none   ASSESSMENT AND PLAN:   1. Thrush of mouth and esophagus ([place]) Is continue Beio (which he has already done). Rx for Magic mouthwash which formulation includes nystatin hydrocortisone and Benadryl. Once the infection has cleared he may go back to Symbicort which I gave him a sample of. We'll have to discuss with insurance carrier if they will cover Symbicort since he has had this side effect to the steroid in [place].  Wardell Honour MD  05/19/2015 4:39 PM

## 2015-05-20 DIAGNOSIS — F3130 Bipolar disorder, current episode depressed, mild or moderate severity, unspecified: Secondary | ICD-10-CM | POA: Diagnosis not present

## 2015-05-22 ENCOUNTER — Telehealth: Payer: Self-pay | Admitting: Family Medicine

## 2015-05-23 ENCOUNTER — Other Ambulatory Visit: Payer: Self-pay | Admitting: Family Medicine

## 2015-05-25 DIAGNOSIS — M5136 Other intervertebral disc degeneration, lumbar region: Secondary | ICD-10-CM | POA: Diagnosis not present

## 2015-05-25 DIAGNOSIS — M47816 Spondylosis without myelopathy or radiculopathy, lumbar region: Secondary | ICD-10-CM | POA: Diagnosis not present

## 2015-05-25 DIAGNOSIS — M4316 Spondylolisthesis, lumbar region: Secondary | ICD-10-CM | POA: Diagnosis not present

## 2015-05-26 DIAGNOSIS — F419 Anxiety disorder, unspecified: Secondary | ICD-10-CM | POA: Diagnosis not present

## 2015-05-26 DIAGNOSIS — R0602 Shortness of breath: Secondary | ICD-10-CM | POA: Diagnosis not present

## 2015-05-26 DIAGNOSIS — J329 Chronic sinusitis, unspecified: Secondary | ICD-10-CM | POA: Diagnosis not present

## 2015-06-05 DIAGNOSIS — N400 Enlarged prostate without lower urinary tract symptoms: Secondary | ICD-10-CM | POA: Diagnosis not present

## 2015-06-05 DIAGNOSIS — Z0000 Encounter for general adult medical examination without abnormal findings: Secondary | ICD-10-CM | POA: Diagnosis not present

## 2015-06-05 DIAGNOSIS — E789 Disorder of lipoprotein metabolism, unspecified: Secondary | ICD-10-CM | POA: Diagnosis not present

## 2015-06-05 DIAGNOSIS — E118 Type 2 diabetes mellitus with unspecified complications: Secondary | ICD-10-CM | POA: Diagnosis not present

## 2015-06-11 DIAGNOSIS — M549 Dorsalgia, unspecified: Secondary | ICD-10-CM | POA: Diagnosis not present

## 2015-06-11 DIAGNOSIS — M25569 Pain in unspecified knee: Secondary | ICD-10-CM | POA: Diagnosis not present

## 2015-06-11 DIAGNOSIS — E118 Type 2 diabetes mellitus with unspecified complications: Secondary | ICD-10-CM | POA: Diagnosis not present

## 2015-06-11 DIAGNOSIS — N400 Enlarged prostate without lower urinary tract symptoms: Secondary | ICD-10-CM | POA: Diagnosis not present

## 2015-06-29 ENCOUNTER — Encounter (HOSPITAL_COMMUNITY): Payer: Self-pay | Admitting: Emergency Medicine

## 2015-06-29 ENCOUNTER — Observation Stay (HOSPITAL_COMMUNITY)
Admission: EM | Admit: 2015-06-29 | Discharge: 2015-06-30 | Disposition: A | Discharge status: Home / Self Care | Payer: Medicare Other | Attending: Internal Medicine | Admitting: Internal Medicine

## 2015-06-29 ENCOUNTER — Emergency Department (HOSPITAL_COMMUNITY): Payer: Medicare Other

## 2015-06-29 DIAGNOSIS — Z9884 Bariatric surgery status: Secondary | ICD-10-CM | POA: Insufficient documentation

## 2015-06-29 DIAGNOSIS — G4733 Obstructive sleep apnea (adult) (pediatric): Secondary | ICD-10-CM | POA: Diagnosis not present

## 2015-06-29 DIAGNOSIS — F329 Major depressive disorder, single episode, unspecified: Secondary | ICD-10-CM | POA: Diagnosis not present

## 2015-06-29 DIAGNOSIS — Z7984 Long term (current) use of oral hypoglycemic drugs: Secondary | ICD-10-CM | POA: Diagnosis not present

## 2015-06-29 DIAGNOSIS — E1165 Type 2 diabetes mellitus with hyperglycemia: Secondary | ICD-10-CM | POA: Diagnosis not present

## 2015-06-29 DIAGNOSIS — Z7982 Long term (current) use of aspirin: Secondary | ICD-10-CM | POA: Diagnosis not present

## 2015-06-29 DIAGNOSIS — F32A Depression, unspecified: Secondary | ICD-10-CM | POA: Diagnosis present

## 2015-06-29 DIAGNOSIS — M79603 Pain in arm, unspecified: Secondary | ICD-10-CM | POA: Diagnosis not present

## 2015-06-29 DIAGNOSIS — I200 Unstable angina: Secondary | ICD-10-CM

## 2015-06-29 DIAGNOSIS — Z9989 Dependence on other enabling machines and devices: Secondary | ICD-10-CM

## 2015-06-29 DIAGNOSIS — M1990 Unspecified osteoarthritis, unspecified site: Secondary | ICD-10-CM | POA: Diagnosis not present

## 2015-06-29 DIAGNOSIS — R079 Chest pain, unspecified: Secondary | ICD-10-CM | POA: Diagnosis not present

## 2015-06-29 DIAGNOSIS — Z87891 Personal history of nicotine dependence: Secondary | ICD-10-CM | POA: Diagnosis not present

## 2015-06-29 DIAGNOSIS — Z96641 Presence of right artificial hip joint: Secondary | ICD-10-CM | POA: Insufficient documentation

## 2015-06-29 DIAGNOSIS — I493 Ventricular premature depolarization: Secondary | ICD-10-CM | POA: Insufficient documentation

## 2015-06-29 DIAGNOSIS — Z8249 Family history of ischemic heart disease and other diseases of the circulatory system: Secondary | ICD-10-CM | POA: Insufficient documentation

## 2015-06-29 DIAGNOSIS — Z96653 Presence of artificial knee joint, bilateral: Secondary | ICD-10-CM | POA: Insufficient documentation

## 2015-06-29 DIAGNOSIS — R0789 Other chest pain: Secondary | ICD-10-CM | POA: Diagnosis not present

## 2015-06-29 DIAGNOSIS — N400 Enlarged prostate without lower urinary tract symptoms: Secondary | ICD-10-CM | POA: Diagnosis present

## 2015-06-29 DIAGNOSIS — IMO0001 Reserved for inherently not codable concepts without codable children: Secondary | ICD-10-CM | POA: Diagnosis present

## 2015-06-29 DIAGNOSIS — R202 Paresthesia of skin: Secondary | ICD-10-CM | POA: Insufficient documentation

## 2015-06-29 DIAGNOSIS — E785 Hyperlipidemia, unspecified: Secondary | ICD-10-CM | POA: Diagnosis not present

## 2015-06-29 LAB — BASIC METABOLIC PANEL
Anion gap: 12 (ref 5–15)
BUN: 14 mg/dL (ref 6–20)
CO2: 22 mmol/L (ref 22–32)
Calcium: 9 mg/dL (ref 8.9–10.3)
Chloride: 104 mmol/L (ref 101–111)
Creatinine, Ser: 0.92 mg/dL (ref 0.61–1.24)
GFR calc Af Amer: >60 mL/min (ref >60)
Glucose, Bld: 262 mg/dL — ABNORMAL HIGH (ref 65–99)
POTASSIUM: 4.3 mmol/L (ref 3.5–5.1)
SODIUM: 138 mmol/L (ref 135–145)

## 2015-06-29 LAB — CBC
HCT: 36.7 % — ABNORMAL LOW (ref 39.0–52.0)
HEMATOCRIT: 35.8 % — AB (ref 39.0–52.0)
HEMOGLOBIN: 11.6 g/dL — AB (ref 13.0–17.0)
Hemoglobin: 11.7 g/dL — ABNORMAL LOW (ref 13.0–17.0)
MCH: 27.9 pg (ref 26.0–34.0)
MCH: 27.3 pg (ref 26.0–34.0)
MCHC: 32.4 g/dL (ref 30.0–36.0)
MCHC: 31.9 g/dL (ref 30.0–36.0)
MCV: 86.1 fL (ref 78.0–100.0)
MCV: 85.7 fL (ref 78.0–100.0)
PLATELETS: 270 10*3/uL (ref 150–400)
Platelets: 268 10*3/uL (ref 150–400)
RBC: 4.16 MIL/uL — AB (ref 4.22–5.81)
RBC: 4.28 MIL/uL (ref 4.22–5.81)
RDW: 13.5 % (ref 11.5–15.5)
RDW: 13.5 % (ref 11.5–15.5)
WBC: 5.7 10*3/uL (ref 4.0–10.5)
WBC: 6.8 10*3/uL (ref 4.0–10.5)

## 2015-06-29 LAB — LIPID PANEL
Cholesterol: 218 mg/dL — ABNORMAL HIGH (ref 0–200)
HDL: 42 mg/dL (ref >40)
LDL CALC: 141 mg/dL — AB (ref 0–99)
TRIGLYCERIDES: 173 mg/dL — AB (ref <150)
Total CHOL/HDL Ratio: 5.2 RATIO
VLDL: 35 mg/dL (ref 0–40)

## 2015-06-29 LAB — I-STAT TROPONIN, ED: Troponin i, poc: 0.01 ng/mL (ref 0.00–0.08)

## 2015-06-29 LAB — GLUCOSE, CAPILLARY: GLUCOSE-CAPILLARY: 211 mg/dL — AB (ref 65–99)

## 2015-06-29 LAB — CREATININE, SERUM
CREATININE: 0.89 mg/dL (ref 0.61–1.24)
GFR calc Af Amer: >60 mL/min (ref >60)

## 2015-06-29 LAB — TROPONIN I

## 2015-06-29 MED ORDER — HEPARIN SODIUM (PORCINE) 5000 UNIT/ML IJ SOLN
5000.0000 [IU] | Freq: Three times a day (TID) | INTRAMUSCULAR | Status: DC
  Administered 2015-06-29 – 2015-06-30 (×2): 5000 [IU] via SUBCUTANEOUS
  Filled 2015-06-29 (×2): qty 1

## 2015-06-29 MED ORDER — TIZANIDINE HCL 4 MG PO TABS
4.0000 mg | ORAL_TABLET | Freq: Four times a day (QID) | ORAL | Status: DC | PRN
  Filled 2015-06-29: qty 1

## 2015-06-29 MED ORDER — VITAMIN D3 25 MCG (1000 UNIT) PO TABS
5000.0000 [IU] | ORAL_TABLET | Freq: Every day | ORAL | Status: DC
  Administered 2015-06-30: 5000 [IU] via ORAL
  Filled 2015-06-29 (×2): qty 5

## 2015-06-29 MED ORDER — PRIMIDONE 50 MG PO TABS
50.0000 mg | ORAL_TABLET | Freq: Three times a day (TID) | ORAL | Status: DC
  Administered 2015-06-29 – 2015-06-30 (×2): 50 mg via ORAL
  Filled 2015-06-29 (×4): qty 1

## 2015-06-29 MED ORDER — ACETAMINOPHEN 500 MG PO TABS: 1000.0000 mg | ORAL_TABLET | Freq: Every day | ORAL | Status: DC | PRN

## 2015-06-29 MED ORDER — TAMSULOSIN HCL 0.4 MG PO CAPS
0.4000 mg | ORAL_CAPSULE | Freq: Every day | ORAL | Status: DC
  Administered 2015-06-30: 0.4 mg via ORAL
  Filled 2015-06-29: qty 1

## 2015-06-29 MED ORDER — ALPRAZOLAM 0.5 MG PO TABS
0.5000 mg | ORAL_TABLET | Freq: Three times a day (TID) | ORAL | Status: DC | PRN
  Administered 2015-06-29: 0.5 mg via ORAL
  Administered 2015-06-30 (×2): 1 mg via ORAL
  Filled 2015-06-29 (×2): qty 2
  Filled 2015-06-29: qty 1

## 2015-06-29 MED ORDER — DUTASTERIDE 0.5 MG PO CAPS
0.5000 mg | ORAL_CAPSULE | Freq: Every day | ORAL | Status: DC
  Administered 2015-06-30: 0.5 mg via ORAL
  Filled 2015-06-29: qty 1

## 2015-06-29 MED ORDER — MOMETASONE FURO-FORMOTEROL FUM 200-5 MCG/ACT IN AERO
2.0000 | INHALATION_SPRAY | Freq: Two times a day (BID) | RESPIRATORY_TRACT | Status: DC
  Administered 2015-06-30: 2 via RESPIRATORY_TRACT
  Filled 2015-06-29 (×2): qty 8.8

## 2015-06-29 MED ORDER — METFORMIN HCL 500 MG PO TABS: 1000.0000 mg | ORAL_TABLET | Freq: Two times a day (BID) | ORAL | Status: DC

## 2015-06-29 MED ORDER — ONDANSETRON HCL 4 MG/2ML IJ SOLN: 4.0000 mg | Freq: Four times a day (QID) | INTRAMUSCULAR | Status: DC | PRN

## 2015-06-29 MED ORDER — ASPIRIN 81 MG PO CHEW
162.0000 mg | CHEWABLE_TABLET | Freq: Once | ORAL | Status: AC
  Administered 2015-06-29: 162 mg via ORAL
  Filled 2015-06-29: qty 2

## 2015-06-29 MED ORDER — SERTRALINE HCL 100 MG PO TABS
200.0000 mg | ORAL_TABLET | Freq: Every day | ORAL | Status: DC
  Administered 2015-06-30: 200 mg via ORAL
  Filled 2015-06-29: qty 2

## 2015-06-29 MED ORDER — DIPHENHYDRAMINE HCL 25 MG PO CAPS
50.0000 mg | ORAL_CAPSULE | Freq: Two times a day (BID) | ORAL | Status: DC
  Administered 2015-06-29 – 2015-06-30 (×2): 50 mg via ORAL
  Filled 2015-06-29 (×4): qty 2

## 2015-06-29 MED ORDER — PIOGLITAZONE HCL 45 MG PO TABS
45.0000 mg | ORAL_TABLET | Freq: Every day | ORAL | Status: DC
  Filled 2015-06-29: qty 1

## 2015-06-29 MED ORDER — ACETAMINOPHEN 325 MG PO TABS
650.0000 mg | ORAL_TABLET | ORAL | Status: DC | PRN
  Administered 2015-06-29 – 2015-06-30 (×2): 650 mg via ORAL
  Filled 2015-06-29 (×2): qty 2

## 2015-06-29 MED ORDER — FLUTICASONE PROPIONATE 50 MCG/ACT NA SUSP
2.0000 | Freq: Every day | NASAL | Status: DC
  Administered 2015-06-30: 2 via NASAL
  Filled 2015-06-29: qty 16

## 2015-06-29 MED ORDER — HYDROCODONE-ACETAMINOPHEN 10-325 MG PO TABS
0.5000 | ORAL_TABLET | Freq: Every day | ORAL | Status: DC | PRN
  Administered 2015-06-30: 1 via ORAL
  Filled 2015-06-29: qty 1

## 2015-06-29 MED ORDER — METFORMIN HCL 500 MG PO TABS
1000.0000 mg | ORAL_TABLET | Freq: Two times a day (BID) | ORAL | Status: DC
  Administered 2015-06-30: 1000 mg via ORAL
  Filled 2015-06-29 (×2): qty 2

## 2015-06-29 MED ORDER — ASPIRIN EC 81 MG PO TBEC: 81.0000 mg | DELAYED_RELEASE_TABLET | Freq: Every day | ORAL | Status: DC | PRN

## 2015-06-29 MED ORDER — ALBUTEROL SULFATE (2.5 MG/3ML) 0.083% IN NEBU: 3.0000 mL | INHALATION_SOLUTION | Freq: Four times a day (QID) | RESPIRATORY_TRACT | Status: DC | PRN

## 2015-06-29 NOTE — ED Provider Notes (Signed)
Patient had sweatiness, mild left anterior chest pressure and left arm pain which awakened him at 4 AM today. He ports that he exerted himself in the yard yesterday, more than normally. He does suffer from chronic left arm pain for the past month. Left arm pain was worse yesterday. He is presently asymptomatic except feels "tired. Cardiac risk factors diabetes obesity family history he has 3 risk factors are on exam no distress lungs clear auscultation heart regular rate and rhythm abdomen obese, nontender extremities without edema. Heart score equals 5 based on EKG criteria age and cardiac risk factors being family history, obesity, diabetes  Ronald Dakin, MD 06/29/15 [phone]

## 2015-06-29 NOTE — Progress Notes (Signed)
Pt arrived to unit. No complaints of pain. VSS. Pt oriented to room.  Raliegh Ip RN

## 2015-06-29 NOTE — ED Provider Notes (Signed)
CSN: [id number]     Arrival date & time 06/29/15  1037 History   First MD Initiated Contact with Patient 06/29/15 1518     Chief Complaint  Patient presents with  . Chest Pain  . Numbness  . Nausea     (Consider location/radiation/quality/duration/timing/severity/associated sxs/prior Treatment) HPI  This is a 70 year old male with a past medical history of obesity, status post gastric bypass, history of cervical disc herniation and chronic left arm pain. Patient presents today with chief complaint of chest pain and weakness. 5 days ago. The patient had an episode of left arm pain and paresthesia of the lasted up about 3 hours after exerting himself in the yard. He denies any other associated symptoms at that time. Yesterday the patient was outside working in the heat. After working. He became extremely weak, which she describes as generalized, had a headache, and nauseous. Patient states that he chewed 2 adult aspirin and went to sleep. When 4:00 in the morning he was awoken with left jaw pain and diaphoresis. He asked his wife to bring him into the hospital today because of his concerns for cardiac origin. He states he is feeling "unwell." He denies any symptoms at this time. He denies any recent illnesses. Cardiac risk factors include obesity, diabetes, and family history.  Past Medical History  Diagnosis Date  . Sleep apnea     settings 10.6  / followed by [name] study years ago  . Recurrent upper respiratory infection (URI)     states Dr Wynelle Link aware- no fever- states is improving  . Arthritis   . Depression     ADD  . Tremor     d/t lithium  . Bronchitis     hx of  . Insomnia   . BPH (benign prostatic hyperplasia)   . Complication of anesthesia Q000111Q    no complications but just says he typically needs "more than expected" to anesthetize; needs CPAP (setting 10) in recovery  . Bulge of cervical disc without myelopathy   . Asthma     anxiety, tree/grass pollen  .  Diabetes mellitus     PCP [name], type 2   Past Surgical History  Procedure Laterality Date  . Cardiac catheterization      2008 pre op gastric bypass  . Joint replacement      Left, Right Knee, Right hip  . Knee arthroscopy      right x 3-4, left x 2  . Knee arthrotomy  ~1991    right  . 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 Polypectomy      vocal cords 1992  . Total hip revision  04/22/2011    Procedure: TOTAL HIP REVISION;  Surgeon: Gearlean Alf, MD;  Location: [facility];  Service: Orthopedics;  Laterality: Right;  . Colonoscopy w/ polypectomy    . Lumbar fusion  04/03/2013    [name], L5-S1  . Total hip arthroplasty Left 10/21/2014    Procedure: LEFT TOTAL HIP ARTHROPLASTY ANTERIOR APPROACH;  Surgeon: [name], MD;  Location: [facility];  Service: Orthopedics;  Laterality: Left;   Family History  Problem Relation Age of Onset  . Asthma Maternal Grandmother   . Heart disease Father   . Anxiety disorder Mother    Social History  Substance Use Topics  . Smoking status: Former Smoker -- 1.50 packs/day for 20 years    Types: Cigarettes    Quit date: 04/17/1990  . Smokeless tobacco:  Never Used  . Alcohol Use: No    Review of Systems  Ten systems reviewed and are negative for acute change, except as noted in the HPI.    Allergies  Breo ellipta; Demerol; Lamotrigine; and Nsaids  Home Medications   Prior to Admission medications   Medication Sig Start Date End Date Taking? Authorizing Provider  acetaminophen (TYLENOL) 500 MG tablet Take 1,000 mg by mouth daily as needed for headache.   Yes Historical Provider, MD  albuterol (PROVENTIL HFA;VENTOLIN HFA) 108 (90 BASE) MCG/ACT inhaler Inhale 2 puffs into the lungs every 6 (six) hours as needed for wheezing or shortness of breath.   Yes Historical Provider, MD  ALPRAZolam Duanne Moron) 0.5 MG tablet Take 0.5-1 mg by mouth 3 (three) times daily as needed for anxiety. Reported on 05/19/2015   Yes Historical Provider, MD  aspirin EC  81 MG tablet Take 81 mg by mouth daily as needed for mild pain.   Yes Historical Provider, MD  budesonide-formoterol (SYMBICORT) 160-4.5 MCG/ACT inhaler Inhale 1 puff into the lungs 2 (two) times daily. Reported on 05/19/2015 05/19/15  Yes Wardell Honour, MD  Cholecalciferol (VITAMIN D3) 5000 units CAPS Take 5,000 Units by mouth daily.   Yes Historical Provider, MD  dextromethorphan-guaiFENesin (MUCINEX DM) 30-600 MG 12hr tablet Take 1 tablet by mouth 2 (two) times daily.   Yes Historical Provider, MD  diphenhydrAMINE (BENADRYL) 25 MG tablet Take 50 mg by mouth 2 (two) times daily.   Yes Historical Provider, MD  dutasteride (AVODART) 0.5 MG capsule Take 0.5 mg by mouth daily.   Yes Historical Provider, MD  fluticasone (FLONASE) 50 MCG/ACT nasal spray Place 2 sprays into the nose 2 (two) times daily. 04/15/14  Yes Historical Provider, MD  HYDROcodone-acetaminophen (NORCO) 10-325 MG per tablet Take 1-2 tablets by mouth every 6 (six) hours as needed for moderate pain. 04/05/13  Yes Eustace Moore, MD  HYDROcodone-acetaminophen [facility] 10-325 MG tablet Take 0.5-1 tablets by mouth daily as needed for moderate pain.  05/26/14  Yes Historical Provider, MD  metFORMIN (GLUCOPHAGE) 500 MG tablet Take 1,000 mg by mouth 2 (two) times daily with a meal.   Yes Historical Provider, MD  Multiple Vitamins-Minerals (CENTRUM SILVER ADULT 50+ PO) Take 1 tablet by mouth daily.   Yes Historical Provider, MD  pioglitazone (ACTOS) 45 MG tablet Take 45 mg by mouth daily.  07/15/14  Yes Historical Provider, MD  primidone (MYSOLINE) 50 MG tablet Take 1 tablet (50 mg total) by mouth 3 (three) times daily. 03/17/15  Yes Rebecca S Tat, DO  sertraline (ZOLOFT) 100 MG tablet Take 200 mg by mouth daily.    Yes Historical Provider, MD  tamsulosin (FLOMAX) 0.4 MG CAPS capsule Take 0.4 mg by mouth daily.    Yes Historical Provider, MD  tiZANidine (ZANAFLEX) 4 MG tablet Take 1 tablet (4 mg total) by mouth every 6 (six) hours as needed for muscle  spasms. 10/21/14  Yes Matthew Babish, PA-C   BP 138/81 mmHg  Pulse 61  Temp(Src) 97.8 F (36.6 C)  Resp 18  Ht 6\' 2"  (1.88 m)  Wt 116.121 kg  BMI 32.85 kg/m2  SpO2 95% Physical Exam  Constitutional: He appears well-developed and well-nourished. No distress.  HENT:  Head: Normocephalic and atraumatic.  Eyes: Conjunctivae are normal. No scleral icterus.  Neck: Normal range of motion. Neck supple.  Cardiovascular: Normal rate, regular rhythm and normal heart sounds.   Pulmonary/Chest: Effort normal and breath sounds normal. No respiratory distress.  Abdominal: Soft.  There is no tenderness.  Musculoskeletal: He exhibits no edema.  Isn't tender to palpation in the left axillary region, especially with palpation of the serratus anterior and subscapularis muscle. I'm able to reproduce symptoms of paresthesia in the arm by pressing on these areas.  Neurological: He is alert.  Skin: Skin is warm and dry. He is not diaphoretic.  Psychiatric: His behavior is normal.  Nursing note and vitals reviewed.   ED Course  Procedures (including critical care time) Labs Review Labs Reviewed  BASIC METABOLIC PANEL - Abnormal; Notable for the following:    Glucose, Bld 262 (*)    All other components within normal limits  CBC - Abnormal; Notable for the following:    RBC 4.16 (*)    Hemoglobin 11.6 (*)    HCT 35.8 (*)    All other components within normal limits  I-STAT TROPOININ, ED    Imaging Review Dg Chest 2 View  06/29/2015  CLINICAL DATA:  Chest pain and left arm pain and left jaw pain. EXAM: CHEST  2 VIEW COMPARISON:  Chest x-ray dated 04/18/2011 and report of chest x-ray dated 01/24/2014 FINDINGS: Heart size and pulmonary vascularity are normal. Chronic accentuation of the interstitial markings and peribronchial thickening. No infiltrates or effusions. No bone abnormality. IMPRESSION: Probable chronic interstitial lung disease. Electronically Signed   By: Lorriane Shire M.D.   On:  06/29/2015 11:12   I have personally reviewed and evaluated these images and lab results as part of my medical decision-making.   EKG Interpretation   Date/Time:  Monday June 29 2015 10:43:51 EDT Ventricular Rate:  77 PR Interval:  168 QRS Duration: 122 QT Interval:  434 QTC Calculation: 491 R Axis:   0 Text Interpretation:  Sinus rhythm with occasional Premature ventricular  complexes Right bundle branch block Abnormal ECG No significant change  since last tracing Confirmed by Winfred Leeds  MD, SAM [phone]) on 06/29/2015  3:48:59 PM      MDM   Final diagnoses:  Chest pain, unspecified chest pain type    Patient with right bundle branch block on EKG unchanged from previous He has a heart score of 5, making him a moderate risk patient and likely needs inpatient observation. Patient seen in shared visit with Dr. Cathleen Fears who agrees with plan of care. I feel that his left arm paresthesia is likely secondary to radiculopathy. Patient will be admitted for cp r/o.  Stable throughout ED visit  Margarita Mail, PA-C 06/30/15 [place], MD 07/01/15 [phone]

## 2015-06-29 NOTE — ED Notes (Signed)
Family at bedside. 

## 2015-06-29 NOTE — ED Notes (Signed)
Pt c/o left sided chest pain to left arm with numbness and tingling to left arm onset Tuesday. Pt reports yesterday around 1pm same symptoms but left arm also felt hot.

## 2015-06-29 NOTE — Consult Note (Signed)
Cardiology Consult    Patient ID: [name]. MRN: [id number], DOB/AGE: [birth year]   Admit date: 06/29/2015 Date of Consult: 06/29/2015  Primary Physician: Ronald Honour, Gillespie Primary Cardiologist: None Requesting Provider: Leisa Lenz, Gillespie  Patient Profile    [phone] former smoker with morbid obesity s/p gastric bypass surgery, DM, OSA on CPAP,  and arthritis who presents with armpit and arm pain/tingling concerning for angina.  Past Medical History   Past Medical History  Diagnosis Date  . Sleep apnea     settings 10.6  / followed by [name] study years ago  . Recurrent upper respiratory infection (URI)     states [name] aware- no fever- states is improving  . Arthritis   . Depression     ADD  . Tremor     d/t lithium  . Bronchitis     hx of  . Insomnia   . BPH (benign prostatic hyperplasia)   . Complication of anesthesia [id number]    no complications but just says he typically needs "more than expected" to anesthetize; needs CPAP (setting 10) in recovery  . Bulge of cervical disc without myelopathy   . Asthma     anxiety, tree/grass pollen  . Diabetes mellitus     PCP [name], type 2    Past Surgical History  Procedure Laterality Date  . Cardiac catheterization      2008 pre op gastric bypass  . Joint replacement      Left, Right Knee, Right hip  . Knee arthroscopy      right x 3-4, left x 2  . Knee arthrotomy  ~1991    right  . 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 Polypectomy      vocal cords 1992  . Total hip revision  04/22/2011    Procedure: TOTAL HIP REVISION;  Surgeon: Ronald Alf, Gillespie;  Location: [facility];  Service: Orthopedics;  Laterality: Right;  . Colonoscopy w/ polypectomy    . Lumbar fusion  04/03/2013    [name], L5-S1  . Total hip arthroplasty Left 10/21/2014    Procedure: LEFT TOTAL HIP ARTHROPLASTY ANTERIOR APPROACH;  Surgeon: [name];  Location: [facility];  Service: Orthopedics;  Laterality: Left;      Allergies  Allergies  Allergen Reactions  . Breo Ellipta [Fluticasone Furoate-Vilanterol] Other (See Comments)    Thrush  . Demerol [Meperidine] Itching  . Lamotrigine Hives and Itching  . Nsaids Nausea Only    Gastric bypass    History of Present Illness    70M former smoker with morbid obesity s/p gastric bypass surgery, DM, OSA on CPAP,  and arthritis who presents with armpit and arm pain/tingling concerning for angina. [name] stats that this past wednes day he was doing light housework, moving some birds seed, when he developed a pain in his left armpit. It began thare and radiated down his arm and up his shoulder. Pain was described as severe, 8/10 in severity. He had tingling and a "pins and needles" sensation in his hand. He did not have any chest pain per se, but did have some tightness, which he attributed to allergies. He chewed 2 baby aspirins and the pain subsided after several hours. Yesterday, while mowing the lawn with a push mower, he developed "extreme nausea" and felt dizzy and lightheaded. He also had some arm and armpit discomfort similar to what he experienced Wednesday. Later in the day, he  felt generally unwell and a bit sweaty. He took 2 ASA, went to bed early, and awoke at about 4am with left jaw pain. Several hours later he came to th ER for evaluation.   He was hemodynamically stable on arrival. Labs were notable for K 4.3, Cr. 0.92, POC TnI 0.01. ECG @ 06/29/15: NSR, RBBB, PVCs. Compared to 10/14/14, QRS is wider and PVCs are new. Otherwise no significant changes. He was symptom free and was admitted to medicine.   Of note, he reports he has had intermittent left armpit discomfort for the past month. His PCP was considering getting some sort of an imaging study if the symptoms persistent. Father died of MI at 81 but had several smaller MIs earler in life. Patient reports he had 2 cardiac caths in the past, most recent around 2008. He describes having an 80% lesion  in a small vessel but never required PCI.    Inpatient Medications    . [START ON 06/30/2015] cholecalciferol  5,000 Units Oral Daily  . diphenhydrAMINE  50 mg Oral BID  . [START ON 06/30/2015] dutasteride  0.5 mg Oral Daily  . [START ON 06/30/2015] fluticasone  2 spray Each Nare Daily  . heparin  5,000 Units Subcutaneous 3 times per day  . [START ON 06/30/2015] metFORMIN  1,000 mg Oral BID WC  . mometasone-formoterol  2 puff Inhalation BID  . [START ON 06/30/2015] pioglitazone  45 mg Oral Daily  . primidone  50 mg Oral TID  . [START ON 06/30/2015] sertraline  200 mg Oral Daily  . [START ON 06/30/2015] tamsulosin  0.4 mg Oral QPC breakfast    Family History    Family History  Problem Relation Age of Onset  . Asthma Maternal Grandmother   . Heart disease Father   . Anxiety disorder Mother     Social History    Social History   Social History  . Marital Status: Married    Spouse Name: N/A  . Number of Children: N/A  . Years of Education: college   Occupational History  . Retired Mathmetician    Social History Main Topics  . Smoking status: Former Smoker -- 1.50 packs/day for 20 years    Types: Cigarettes    Quit date: 04/17/1990  . Smokeless tobacco: Never Used  . Alcohol Use: No  . Drug Use: No  . Sexual Activity: Not on file   Other Topics Concern  . Not on file   Social History Narrative   Patient works for [employer]. Patient has masters degree. Patient is married.     Review of Systems    General:  No chills, fever, night sweats or weight changes.  Cardiovascular: See HPI Dermatological: No rash, lesions/masses Respiratory: No cough, dyspnea Urologic: No hematuria, dysuria Abdominal:   No nausea, vomiting, diarrhea, bright red blood per rectum, melena, or hematemesis Neurologic:  No visual changes, wkns, changes in mental status. All other systems reviewed and are otherwise negative except as noted above.  Physical Exam    Blood pressure 143/74, pulse 72,  temperature 98.2 F (36.8 C), temperature source Oral, resp. rate 16, height 6\' 2"  (1.88 m), weight 119.614 kg (263 lb 11.2 oz), SpO2 100 %.  General: Pleasant, NAD, mildly nervous Psych: Normal affect. Neuro: Alert and oriented X 3. Moves all extremities spontaneously. HEENT: Normal  Neck: Supple without bruits or JVD. Lungs:  Resp regular and unlabored, CTA. Heart: RRR no s3, s4, or murmurs. Abdomen: Soft, non-tender, non-distended, BS + x 4.  Extremities: No clubbing, cyanosis or edema. DP/PT/Radials 2+ and equal bilaterally. Other: Palpation of left armpit can elicit some of the presenting pain  Labs    Troponin (Point of Care Test)  Recent Labs  06/29/15 1112  TROPIPOC 0.01   No results for input(s): CKTOTAL, CKMB, TROPONINI in the last 72 hours. Lab Results  Component Value Date   WBC 5.7 06/29/2015   HGB 11.6* 06/29/2015   HCT 35.8* 06/29/2015   MCV 86.1 06/29/2015   PLT 268 06/29/2015    Recent Labs Lab 06/29/15 1122  NA 138  K 4.3  CL 104  CO2 22  BUN 14  CREATININE 0.92  CALCIUM 9.0  GLUCOSE 262*   No results found for: CHOL, HDL, LDLCALC, TRIG No results found for: [facility]   Radiology Studies    Dg Chest 2 View  06/29/2015  CLINICAL DATA:  Chest pain and left arm pain and left jaw pain. EXAM: CHEST  2 VIEW COMPARISON:  Chest x-ray dated 04/18/2011 and report of chest x-ray dated 01/24/2014 FINDINGS: Heart size and pulmonary vascularity are normal. Chronic accentuation of the interstitial markings and peribronchial thickening. No infiltrates or effusions. No bone abnormality. IMPRESSION: Probable chronic interstitial lung disease. Electronically Signed   By: Lorriane Shire M.D.   On: 06/29/2015 11:12    ECG & Cardiac Imaging     ECG @ 06/29/15: NSR, RBBB, PVCs. Compared to 10/14/14, QRS is wider and PVCs are new.   Tele: several PVCs with occasional triplets and bigeminy  Assessment & Plan    [phone] former smoker with morbid obesity s/p gastric bypass  surgery, DM, OSA on CPAP,  and arthritis who presents with armpit and arm pain/tingling concerning for angina. He is at high risk for CAD based on obesity, DM, prior smoking, and FH of CAD. His story has both typical and atypical findings (including partial reproducibility).  His ECG demonstrates no acute ischemic changes and Tn is normal.   - start metoprolol 12.5mg  BID for PVCs - Check Mg and replete to 2.0 - ASA 81mg  daily - start atorvatstatin 80mg  - Hold metformin in case of cath - Continue to cycle troponins - NPO s/p MN for stress vs. cath  Signed, Lamar Sprinkles, Gillespie 06/29/2015, 8:23 PM

## 2015-06-29 NOTE — ED Notes (Signed)
Family made aware of pts bed assignment 

## 2015-06-29 NOTE — H&P (Addendum)
History and Physical    [name]. [id number] DOB: 19-Oct-1945 DOA: 06/29/2015  Referring MD/NP/PA: PA Margarita Mail  PCP: Wardell Honour, MD   Outpatient Specialists: none   Patient coming from: home   Chief Complaint: chest pain   HPI: [name]. is a 70 y.o. male with medical history significant for diabetes (on actos and metformin), depression, BPH, OSA on CPAP. He was working in a yard the day prior to this admission and started to feel headache and nausea. He also had pain under the left arm which radiated to left elbow and from elbow down he felt pins and needles sensation. He never felt chest pain, rather chest tightness which persisted. Before he went to bed he took 2 aspirins and then he woke up at 4 am with the left side jaw pain and profuse sweating. He also thinks he might have had associated palpitations. No lightheadedness, no loss of consciousness. No leg swelling. No reports of abdominal pain, nausea or vomiting. No fevers, no cough. No lightheadedness. No urinary complaints. Of note, his father dies of MI at the age of 54.  ED Course: Pt was hemodynamically stable in ED. His EKG showed normal sinus rhythm with occasional PVC's, RBBB. The troponin was WNL. Blood work was relatively unremarkable. He was given aspirin in ED. His pain subsided a little but is still present and due to his heart score of 5 (moderately suspicious history - 1; non specific EKG changes - 1; age - 2; 1 or 2 risk factors - 1) he was admitted for evaluation of chest pain.    Review of Systems:  Constitutional: Negative for fever, chills, diaphoresis, activity change, appetite change and fatigue.  HENT: Negative for ear pain, nosebleeds, congestion, facial swelling, rhinorrhea, neck pain, neck stiffness and ear discharge.   Eyes: Negative for pain, discharge, redness, itching and visual disturbance.  Respiratory: Negative for cough, choking, chest tightness, shortness of  breath, wheezing and stridor.   Cardiovascular: Positive for chest pain, no palpitations and leg swelling.  Gastrointestinal: Negative for abdominal distention. Positive for nausea.  Genitourinary: Negative for dysuria, urgency, frequency, hematuria, flank pain, decreased urine volume, difficulty urinating and dyspareunia.  Musculoskeletal: Negative for back pain, joint swelling, arthralgias and gait problem.  Neurological: Negative for dizziness, tremors, seizures, syncope, facial asymmetry, speech difficulty, weakness, light-headedness, numbness and positive for headaches.  Hematological: Negative for adenopathy. Does not bruise/bleed easily.  Psychiatric/Behavioral: Negative for hallucinations, behavioral problems, confusion, dysphoric mood, decreased concentration and agitation.   Past Medical History  Diagnosis Date  . Sleep apnea     settings 10.6  / followed by [name] study years ago  . Recurrent upper respiratory infection (URI)     states Dr Wynelle Link aware- no fever- states is improving  . Arthritis   . Depression     ADD  . Tremor     d/t lithium  . Bronchitis     hx of  . Insomnia   . BPH (benign prostatic hyperplasia)   . Complication of anesthesia [id number]    no complications but just says he typically needs "more than expected" to anesthetize; needs CPAP (setting 10) in recovery  . Bulge of cervical disc without myelopathy   . Asthma     anxiety, tree/grass pollen  . Diabetes mellitus     PCP [name], type 2    Past Surgical History  Procedure Laterality Date  . Cardiac catheterization  2008 pre op gastric bypass  . Joint replacement      Left, Right Knee, Right hip  . Knee arthroscopy      right x 3-4, left x 2  . Knee arthrotomy  ~1991    right  . 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 Polypectomy      vocal cords 1992  . Total hip revision  04/22/2011    Procedure: TOTAL HIP REVISION;  Surgeon: Gearlean Alf, MD;  Location: [facility];  Service:  Orthopedics;  Laterality: Right;  . Colonoscopy w/ polypectomy    . Lumbar fusion  04/03/2013    [name], L5-S1  . Total hip arthroplasty Left 10/21/2014    Procedure: LEFT TOTAL HIP ARTHROPLASTY ANTERIOR APPROACH;  Surgeon: [name], MD;  Location: [facility];  Service: Orthopedics;  Laterality: Left;     reports that he quit smoking about 25 years ago. His smoking use included Cigarettes. He has a 30 pack-year smoking history. He has never used smokeless tobacco. He reports that he does not drink alcohol or use illicit drugs.  Allergies  Allergen Reactions  . Breo Ellipta [Fluticasone Furoate-Vilanterol] Other (See Comments)    Thrush  . Demerol [Meperidine] Itching  . Lamotrigine Hives and Itching  . Nsaids Nausea Only    Gastric bypass    Family History  Problem Relation Age of Onset  . Asthma Maternal Grandmother   . Heart disease Father   . Anxiety disorder Mother     Prior to Admission medications   Medication Sig Start Date End Date Taking? Authorizing Provider  acetaminophen (TYLENOL) 500 MG tablet Take 1,000 mg by mouth daily as needed for headache.   Yes Historical Provider, MD  albuterol (PROVENTIL HFA;VENTOLIN HFA) 108 (90 BASE) MCG/ACT inhaler Inhale 2 puffs into the lungs every 6 (six) hours as needed for wheezing or shortness of breath.   Yes Historical Provider, MD  ALPRAZolam Duanne Moron) 0.5 MG tablet Take 0.5-1 mg by mouth 3 (three) times daily as needed for anxiety. Reported on 05/19/2015   Yes Historical Provider, MD  aspirin EC 81 MG tablet Take 81 mg by mouth daily as needed for mild pain.   Yes Historical Provider, MD  budesonide-formoterol (SYMBICORT) 160-4.5 MCG/ACT inhaler Inhale 1 puff into the lungs 2 (two) times daily. Reported on 05/19/2015 05/19/15  Yes Wardell Honour, MD  Cholecalciferol (VITAMIN D3) 5000 units CAPS Take 5,000 Units by mouth daily.   Yes Historical Provider, MD  dextromethorphan-guaiFENesin (MUCINEX DM) 30-600 MG 12hr tablet Take 1 tablet by  mouth 2 (two) times daily.   Yes Historical Provider, MD  diphenhydrAMINE (BENADRYL) 25 MG tablet Take 50 mg by mouth 2 (two) times daily.   Yes Historical Provider, MD  dutasteride (AVODART) 0.5 MG capsule Take 0.5 mg by mouth daily.   Yes Historical Provider, MD  fluticasone (FLONASE) 50 MCG/ACT nasal spray Place 2 sprays into the nose 2 (two) times daily. 04/15/14  Yes Historical Provider, MD  HYDROcodone-acetaminophen (NORCO) 10-325 MG per tablet Take 1-2 tablets by mouth every 6 (six) hours as needed for moderate pain. 04/05/13  Yes Eustace Moore, MD  HYDROcodone-acetaminophen [facility]) 10-325 MG tablet Take 0.5-1 tablets by mouth daily as needed for moderate pain.  05/26/14  Yes Historical Provider, MD  metFORMIN (GLUCOPHAGE) 500 MG tablet Take 1,000 mg by mouth 2 (two) times daily with a meal.   Yes Historical Provider, MD  Multiple Vitamins-Minerals (CENTRUM SILVER ADULT 50+ PO)  Take 1 tablet by mouth daily.   Yes Historical Provider, MD  pioglitazone (ACTOS) 45 MG tablet Take 45 mg by mouth daily.  07/15/14  Yes Historical Provider, MD  primidone (MYSOLINE) 50 MG tablet Take 1 tablet (50 mg total) by mouth 3 (three) times daily. 03/17/15  Yes Rebecca S Tat, DO  sertraline (ZOLOFT) 100 MG tablet Take 200 mg by mouth daily.    Yes Historical Provider, MD  tamsulosin (FLOMAX) 0.4 MG CAPS capsule Take 0.4 mg by mouth daily.    Yes Historical Provider, MD  tiZANidine (ZANAFLEX) 4 MG tablet Take 1 tablet (4 mg total) by mouth every 6 (six) hours as needed for muscle spasms. 10/21/14  Yes Danae Orleans, PA-C    Physical Exam: Filed Vitals:   06/29/15 1520 06/29/15 1615 06/29/15 1621 06/29/15 1830  BP: 138/81 163/71  143/74  Pulse: 61 62 71 59  Temp:      Resp: 18 18  16   Height:      Weight:      SpO2: 95% 100% 99% 99%    Constitutional: NAD, calm, comfortable Filed Vitals:   06/29/15 1520 06/29/15 1615 06/29/15 1621 06/29/15 1830  BP: 138/81 163/71  143/74  Pulse: 61 62 71 59  Temp:        Resp: 18 18  16   Height:      Weight:      SpO2: 95% 100% 99% 99%   Eyes: PERRL, lids and conjunctivae normal ENMT: Mucous membranes are moist. Posterior pharynx clear of any exudate or lesions.Normal dentition.  Neck: normal, supple, no masses, no thyromegaly Respiratory: clear to auscultation bilaterally, no wheezing, no crackles. Normal respiratory effort. No accessory muscle use.  Cardiovascular: Regular rate and rhythm, no murmurs / rubs / gallops. He does have almost like a third heart beat. No extremity edema. 2+ pedal pulses. No carotid bruits.  Abdomen: no tenderness, no masses palpated. No hepatosplenomegaly. Bowel sounds positive.  Musculoskeletal: no clubbing / cyanosis. No joint deformity upper and lower extremities. Good ROM, no contractures. Normal muscle tone.  Skin: no rashes, lesions, ulcers. No induration Neurologic: CN 2-12 grossly intact. Sensation intact, DTR normal. Strength 5/5 in all 4.  Psychiatric: Normal judgment and insight. Alert and oriented x 3. Normal mood.    Labs on Admission: I have personally reviewed following labs and imaging studies  CBC:  Recent Labs Lab 06/29/15 1122  WBC 5.7  HGB 11.6*  HCT 35.8*  MCV 86.1  PLT [id number]   Basic Metabolic Panel:  Recent Labs Lab 06/29/15 1122  NA 138  K 4.3  CL 104  CO2 22  GLUCOSE 262*  BUN 14  CREATININE 0.92  CALCIUM 9.0   GFR: Estimated Creatinine Clearance: 104.1 mL/min (by C-G formula based on Cr of 0.92). Liver Function Tests: No results for input(s): AST, ALT, ALKPHOS, BILITOT, PROT, ALBUMIN in the last 168 hours. No results for input(s): LIPASE, AMYLASE in the last 168 hours. No results for input(s): AMMONIA in the last 168 hours. Coagulation Profile: No results for input(s): INR, PROTIME in the last 168 hours. Cardiac Enzymes: No results for input(s): CKTOTAL, CKMB, CKMBINDEX, TROPONINI in the last 168 hours. BNP (last 3 results) No results for input(s): PROBNP in the last 8760  hours. HbA1C: No results for input(s): HGBA1C in the last 72 hours. CBG: No results for input(s): GLUCAP in the last 168 hours. Lipid Profile: No results for input(s): CHOL, HDL, LDLCALC, TRIG, CHOLHDL, LDLDIRECT in the last 72 hours. Thyroid  Function Tests: No results for input(s): TSH, T4TOTAL, FREET4, T3FREE, THYROIDAB in the last 72 hours. Anemia Panel: No results for input(s): VITAMINB12, FOLATE, FERRITIN, TIBC, IRON, RETICCTPCT in the last 72 hours. Urine analysis:    Component Value Date/Time   COLORURINE AMBER* 10/14/2014 0944   APPEARANCEUR CLOUDY* 10/14/2014 0944   LABSPEC 1.023 10/14/2014 0944   PHURINE 5.0 10/14/2014 0944   GLUCOSEU 500* 10/14/2014 0944   HGBUR LARGE* 10/14/2014 [place] 10/14/2014 0944   KETONESUR NEGATIVE 10/14/2014 0944   PROTEINUR 30* 10/14/2014 0944   UROBILINOGEN 0.2 10/14/2014 0944   NITRITE NEGATIVE 10/14/2014 0944   LEUKOCYTESUR LARGE* 10/14/2014 0944   Sepsis Labs: @LABRCNTIP (procalcitonin:4,lacticidven:4) )No results found for this or any previous visit (from the past 240 hour(s)).   Radiological Exams on Admission: Dg Chest 2 View 06/29/2015  Probable chronic interstitial lung disease. Electronically Signed   By: Lorriane Shire M.D.   On: 06/29/2015 11:12    EKG: Independently reviewed. Sinus rhythm, q waves in lead 3, RBBB, occasional PVC  Assessment/Plan   Principal Problem:   Chest pain, at rest - Pt presented with chest pain with Heart score of 5 - The first troponin was WNL - Cycle cardiac enzymes - Follow up 2 D ECHO - The 12 lead EKG showed sinus rhythm, occasional PVC - Repeat 12 lead EKG - Check A1c and lipid panel   Active Problems:   OSA on CPAP - CPAP at bedtime    Uncontrolled diabetes mellitus without complication, without long-term current use of insulin (HCC) - No recent A1c on file - Resume actos and metformin - Check A1c    Depression - Resume sertraline    BPH (benign prostatic  hyperplasia) - Continue flomax and avodart    DVT prophylaxis: heparin subQ  Code Status: full code  Family Communication: no family at the bedside  Disposition Plan: admission to telemetry  Consults called: none  Admission status: observation, telemetry    Leisa Lenz MD [facility] Pager [phone]  If 7PM-7AM, please contact night-coverage [email or web address] Password [facility]  06/29/2015, 6:47 PM

## 2015-06-30 ENCOUNTER — Observation Stay (HOSPITAL_COMMUNITY): Payer: Medicare Other

## 2015-06-30 ENCOUNTER — Observation Stay (HOSPITAL_BASED_OUTPATIENT_CLINIC_OR_DEPARTMENT_OTHER): Payer: Medicare Other

## 2015-06-30 DIAGNOSIS — I200 Unstable angina: Secondary | ICD-10-CM | POA: Diagnosis not present

## 2015-06-30 DIAGNOSIS — R0789 Other chest pain: Secondary | ICD-10-CM | POA: Diagnosis not present

## 2015-06-30 DIAGNOSIS — E785 Hyperlipidemia, unspecified: Secondary | ICD-10-CM

## 2015-06-30 DIAGNOSIS — I493 Ventricular premature depolarization: Secondary | ICD-10-CM

## 2015-06-30 DIAGNOSIS — G4733 Obstructive sleep apnea (adult) (pediatric): Secondary | ICD-10-CM | POA: Diagnosis not present

## 2015-06-30 DIAGNOSIS — E1165 Type 2 diabetes mellitus with hyperglycemia: Secondary | ICD-10-CM

## 2015-06-30 DIAGNOSIS — N400 Enlarged prostate without lower urinary tract symptoms: Secondary | ICD-10-CM | POA: Diagnosis not present

## 2015-06-30 DIAGNOSIS — R079 Chest pain, unspecified: Secondary | ICD-10-CM | POA: Diagnosis not present

## 2015-06-30 DIAGNOSIS — F329 Major depressive disorder, single episode, unspecified: Secondary | ICD-10-CM | POA: Diagnosis not present

## 2015-06-30 LAB — NM MYOCAR MULTI W/SPECT W/WALL MOTION / EF
CHL CUP NUCLEAR SRS: 0
CHL CUP NUCLEAR SSS: 1
CSEPED: 0 min
CSEPHR: 65 %
Estimated workload: 1 METS
Exercise duration (sec): 0 s
LV dias vol: 149 mL (ref 62–150)
LV sys vol: 65 mL
MPHR: 152 {beats}/min
Peak HR: 99 {beats}/min
RATE: 0.33
Rest HR: 71 {beats}/min
SDS: 1
TID: 1.23

## 2015-06-30 LAB — GLUCOSE, CAPILLARY
GLUCOSE-CAPILLARY: 145 mg/dL — AB (ref 65–99)
GLUCOSE-CAPILLARY: 153 mg/dL — AB (ref 65–99)

## 2015-06-30 LAB — TROPONIN I: Troponin I: <0.03 ng/mL (ref <0.031)

## 2015-06-30 LAB — ECHOCARDIOGRAM COMPLETE
Height: 74 in
WEIGHTICAEL: 4219.2 [oz_av]

## 2015-06-30 MED ORDER — METOPROLOL TARTRATE 12.5 MG HALF TABLET
12.5000 mg | ORAL_TABLET | Freq: Two times a day (BID) | ORAL | Status: DC
  Administered 2015-06-30: 12.5 mg via ORAL
  Filled 2015-06-30: qty 1

## 2015-06-30 MED ORDER — ATORVASTATIN CALCIUM 80 MG PO TABS: 80.0000 mg | ORAL_TABLET | Freq: Every day | ORAL | Status: DC

## 2015-06-30 MED ORDER — REGADENOSON 0.4 MG/5ML IV SOLN
0.4000 mg | Freq: Once | INTRAVENOUS | Status: AC
  Administered 2015-06-30: 0.4 mg via INTRAVENOUS
  Filled 2015-06-30: qty 5

## 2015-06-30 MED ORDER — INSULIN ASPART 100 UNIT/ML ~~LOC~~ SOLN: 0.0000 [IU] | Freq: Three times a day (TID) | SUBCUTANEOUS | Status: DC

## 2015-06-30 MED ORDER — REGADENOSON 0.4 MG/5ML IV SOLN
INTRAVENOUS | Status: AC
  Administered 2015-06-30: 0.4 mg via INTRAVENOUS
  Filled 2015-06-30: qty 5

## 2015-06-30 MED ORDER — TECHNETIUM TC 99M SESTAMIBI GENERIC - CARDIOLITE
10.0000 | Freq: Once | INTRAVENOUS | Status: AC | PRN
  Administered 2015-06-30: 10 via INTRAVENOUS

## 2015-06-30 MED ORDER — TECHNETIUM TC 99M SESTAMIBI GENERIC - CARDIOLITE
30.0000 | Freq: Once | INTRAVENOUS | Status: AC | PRN
  Administered 2015-06-30: 30 via INTRAVENOUS

## 2015-06-30 MED ORDER — METOPROLOL TARTRATE 25 MG PO TABS: 12.5000 mg | ORAL_TABLET | Freq: Two times a day (BID) | ORAL | Status: DC

## 2015-06-30 NOTE — Discharge Summary (Signed)
Physician Discharge Summary  [name]. [id number] DOB: 1945/11/25 DOA: 06/29/2015  PCP: Wardell Honour, MD  Admit date: 06/29/2015 Discharge date: 06/30/2015  Recommendations for Outpatient Follow-up:  1. Pt understand new meds include metoprolol and atorvastatin   Discharge Diagnoses:  Principal Problem:   Chest pain Active Problems:   OSA on CPAP   Uncontrolled diabetes mellitus without complication, without long-term current use of insulin (HCC)   Depression   BPH (benign prostatic hyperplasia)    Discharge Condition: stable   Diet recommendation: as tolerated   History of present illness:  Per admission HPI "70 y.o. male with medical history significant for diabetes (on actos and metformin), depression, BPH, OSA on CPAP. He was working in a yard the day prior to this admission and started to feel headache and nausea. He also had pain under the left arm which radiated to left elbow and from elbow down he felt pins and needles sensation. He never felt chest pain, rather chest tightness which persisted. Before he went to bed he took 2 aspirins and then he woke up at 4 am with the left side jaw pain and profuse sweating. He also thinks he might have had associated palpitations. No lightheadedness, no loss of consciousness. No leg swelling. No reports of abdominal pain, nausea or vomiting. No fevers, no cough. No lightheadedness. No urinary complaints. Of note, his father dies of MI at the age of 40.  ED Course: Pt was hemodynamically stable in ED. His EKG showed normal sinus rhythm with occasional PVC's, RBBB. The troponin was WNL. Blood work was relatively unremarkable. He was given aspirin in ED. His pain subsided a little but is still present and due to his heart score of 5 (moderately suspicious history - 1; non specific EKG changes - 1; age - 2; 1 or 2 risk factors - 1) he was admitted for evaluation of chest pain"  Hospital Course:   Principal Problem:  Chest  pain, at rest - Pt presented with chest pain with Heart score of 5 - Trop x 3 WNL - 2 D ECHO with normal EF - Stress test without ischemic changes - Statin and metoprolol on discharge per cardio    Active Problems:  OSA on CPAP - CPAP at bedtime   Uncontrolled diabetes mellitus without complication, without long-term current use of insulin (HCC) -A1c 8 on this admission - Continue home meds     Depression - Resume sertraline   BPH (benign prostatic hyperplasia) - Continue flomax and avodart    DVT prophylaxis: heparin subQ  Code Status: full code  Family Communication: no family at the bedside  Consults called: Cardiology    Signed:  Leisa Lenz, MD  [facility] 06/30/2015, 3:26 PM  Pager #: [phone]  Time spent in minutes: less than 30 minutes   Discharge Exam: Filed Vitals:   06/30/15 1306 06/30/15 1307  BP:    Pulse: 85 83  Temp:    Resp:     Filed Vitals:   06/30/15 1304 06/30/15 1305 06/30/15 1306 06/30/15 1307  BP:  114/94    Pulse: 97 113 85 83  Temp:      TempSrc:      Resp:      Height:      Weight:      SpO2:        General: Pt is alert, follows commands appropriately, not in acute distress Cardiovascular: Regular rate and rhythm, S1/S2 + Respiratory: Clear to auscultation bilaterally, no  wheezing, no crackles, no rhonchi Abdominal: Soft, non tender, non distended, bowel sounds +, no guarding Extremities: no edema, no cyanosis, pulses palpable bilaterally DP and PT Neuro: Grossly nonfocal  Discharge Instructions  Discharge Instructions    Call MD for:  difficulty breathing, headache or visual disturbances    Complete by:  As directed      Call MD for:  persistant dizziness or light-headedness    Complete by:  As directed      Call MD for:  persistant nausea and vomiting    Complete by:  As directed      Call MD for:  severe uncontrolled pain    Complete by:  As directed      Diet - low sodium heart healthy     Complete by:  As directed      Discharge instructions    Complete by:  As directed   New medications: atorvastatin 80 mg daily, metoprolol 12.5 mg twice a day     Increase activity slowly    Complete by:  As directed             Medication List    TAKE these medications        acetaminophen 500 MG tablet  Commonly known as:  TYLENOL  Take 1,000 mg by mouth daily as needed for headache.     albuterol 108 (90 Base) MCG/ACT inhaler  Commonly known as:  PROVENTIL HFA;VENTOLIN HFA  Inhale 2 puffs into the lungs every 6 (six) hours as needed for wheezing or shortness of breath.     ALPRAZolam 0.5 MG tablet  Commonly known as:  XANAX  Take 0.5-1 mg by mouth 3 (three) times daily as needed for anxiety. Reported on 05/19/2015     aspirin EC 81 MG tablet  Take 81 mg by mouth daily as needed for mild pain.     atorvastatin 80 MG tablet  Commonly known as:  LIPITOR  Take 1 tablet (80 mg total) by mouth daily at 6 PM.     budesonide-formoterol 160-4.5 MCG/ACT inhaler  Commonly known as:  SYMBICORT  Inhale 1 puff into the lungs 2 (two) times daily. Reported on 05/19/2015     CENTRUM SILVER ADULT 50+ PO  Take 1 tablet by mouth daily.     dextromethorphan-guaiFENesin 30-600 MG 12hr tablet  Commonly known as:  MUCINEX DM  Take 1 tablet by mouth 2 (two) times daily.     diphenhydrAMINE 25 MG tablet  Commonly known as:  BENADRYL  Take 50 mg by mouth 2 (two) times daily.     dutasteride 0.5 MG capsule  Commonly known as:  AVODART  Take 0.5 mg by mouth daily.     fluticasone 50 MCG/ACT nasal spray  Commonly known as:  FLONASE  Place 2 sprays into the nose 2 (two) times daily.     HYDROcodone-acetaminophen 10-325 MG tablet  Commonly known as:  NORCO  Take 1-2 tablets by mouth every 6 (six) hours as needed for moderate pain.     metFORMIN 500 MG tablet  Commonly known as:  GLUCOPHAGE  Take 1,000 mg by mouth 2 (two) times daily with a meal.     metoprolol tartrate 25 MG tablet   Commonly known as:  LOPRESSOR  Take 0.5 tablets (12.5 mg total) by mouth 2 (two) times daily.     pioglitazone 45 MG tablet  Commonly known as:  ACTOS  Take 45 mg by mouth daily.     primidone 50  MG tablet  Commonly known as:  MYSOLINE  Take 1 tablet (50 mg total) by mouth 3 (three) times daily.     sertraline 100 MG tablet  Commonly known as:  ZOLOFT  Take 200 mg by mouth daily.     tamsulosin 0.4 MG Caps capsule  Commonly known as:  FLOMAX  Take 0.4 mg by mouth daily.     tiZANidine 4 MG tablet  Commonly known as:  ZANAFLEX  Take 1 tablet (4 mg total) by mouth every 6 (six) hours as needed for muscle spasms.     Vitamin D3 5000 units Caps  Take 5,000 Units by mouth daily.           Follow-up Information    Follow up with Sanda Klein, MD.   Specialty:  Cardiology   Why:  followup with cardiology As needed   Contact information:   [address] [place] [address] [phone]       Follow up with Wardell Honour, MD. Schedule an appointment as soon as possible for a visit in 2 weeks.   Specialty:  Family Medicine   Why:  Follow up appt after recent hospitalization   Contact information:   [place] [address] [phone]        The results of significant diagnostics from this hospitalization (including imaging, microbiology, ancillary and laboratory) are listed below for reference.    Significant Diagnostic Studies: Dg Chest 2 View  06/29/2015  CLINICAL DATA:  Chest pain and left arm pain and left jaw pain. EXAM: CHEST  2 VIEW COMPARISON:  Chest x-ray dated 04/18/2011 and report of chest x-ray dated 01/24/2014 FINDINGS: Heart size and pulmonary vascularity are normal. Chronic accentuation of the interstitial markings and peribronchial thickening. No infiltrates or effusions. No bone abnormality. IMPRESSION: Probable chronic interstitial lung disease. Electronically Signed   By: Lorriane Shire M.D.   On: 06/29/2015 11:12    Nm Myocar Multi W/spect W/wall Motion / Ef  06/30/2015   There was no ST segment deviation noted during stress.  This is a low risk study.  Nuclear stress EF: 56%.  No T wave inversion was noted during stress.  Normal perfusion. LVEF 56%. PVC's noted during the lexiscan stress portion. This is a low risk study.    Microbiology: No results found for this or any previous visit (from the past 240 hour(s)).   Labs: Basic Metabolic Panel:  Recent Labs Lab 06/29/15 1122 06/29/15 1929  NA 138  --   K 4.3  --   CL 104  --   CO2 22  --   GLUCOSE 262*  --   BUN 14  --   CREATININE 0.92 0.89  CALCIUM 9.0  --    Liver Function Tests: No results for input(s): AST, ALT, ALKPHOS, BILITOT, PROT, ALBUMIN in the last 168 hours. No results for input(s): LIPASE, AMYLASE in the last 168 hours. No results for input(s): AMMONIA in the last 168 hours. CBC:  Recent Labs Lab 06/29/15 1122 06/29/15 1929  WBC 5.7 6.8  HGB 11.6* 11.7*  HCT 35.8* 36.7*  MCV 86.1 85.7  PLT 268 270   Cardiac Enzymes:  Recent Labs Lab 06/29/15 1929 06/30/15 0121  TROPONINI <0.03 <0.03   BNP: BNP (last 3 results) No results for input(s): BNP in the last 8760 hours.  ProBNP (last 3 results) No results for input(s): PROBNP in the last 8760 hours.  CBG:  Recent Labs Lab 06/29/15 2127 06/30/15 [id number]  06/30/15 1128  GLUCAP 211* 145* 153*

## 2015-06-30 NOTE — Discharge Instructions (Signed)
Angina Pectoris °Angina pectoris is a very bad feeling in the chest, neck, or arm. Your doctor may call it angina. There are four types of angina. Angina is caused by a lack of blood in the middle and thickest layer of the heart wall (myocardium). Angina may feel like a crushing or squeezing pain in the chest. It may feel like tightness or heavy pressure in the chest. Some people say it feels like gas, heartburn, or indigestion. Some people have symptoms other than pain. These include: °· Shortness of breath. °· Cold sweats. °· Feeling sick to your stomach (nausea). °· Feeling light-headed. °Many women have chest discomfort and some of the other symptoms. However, women often have different symptoms, such as: °· Feeling tired (fatigue). °· Feeling nervous for no reason. °· Feeling weak for no reason. °· Dizziness or fainting. °Women may have angina without any symptoms. °HOME CARE °· Take medicines only as told by your doctor. °· Take care of other health issues as told by your doctor. These include: °¨ High blood pressure (hypertension). °¨ Diabetes. °· Follow a heart-healthy diet. Your doctor can help you to choose healthy food options and make changes. °· Talk to your doctor to learn more about healthy cooking methods and use them. These include: °¨ Roasting. °¨ Grilling. °¨ Broiling. °¨ Baking. °¨ Poaching. °¨ Steaming. °¨ Stir-frying. °· Follow an exercise program approved by your doctor. °· Keep a healthy weight. Lose weight as told by your doctor. °· Rest when you are tired. °· Learn to manage stress. °· Do not use any tobacco, such as cigarettes, chewing tobacco, or electronic cigarettes. If you need help quitting, ask your doctor. °· If you drink alcohol, and your doctor says it is okay, limit yourself to no more than 1 drink per day. One drink equals 12 ounces of beer, 5 ounces of wine, or 1½ ounces of hard liquor. °· Stop illegal drug use. °· Keep all follow-up visits as told by your doctor. This is  important. °Do not take these medicines unless your doctor says that you can: °· Nonsteroidal anti-inflammatory drugs (NSAIDs). These include: °¨ Ibuprofen. °¨ Naproxen. °¨ Celecoxib. °· Vitamin supplements that have vitamin A, vitamin E, or both. °· Hormone therapy that contains estrogen with or without progestin. °GET HELP RIGHT AWAY IF: °· You have pain in your chest, neck, arm, jaw, stomach, or back that: °¨ Lasts more than a few minutes. °¨ Comes back. °¨ Does not get better after you take medicine under your tongue (sublingual nitroglycerin). °· You have any of these symptoms for no reason: °¨ Gas, heartburn, or indigestion. °¨ Sweating a lot. °¨ Shortness of breath or trouble breathing. °¨ Feeling sick to your stomach or throwing up. °¨ Feeling more tired than usual. °¨ Feeling nervous or worrying more than usual. °¨ Feeling weak. °¨ Diarrhea. °· You are suddenly dizzy or light-headed. °· You faint or pass out. °These symptoms may be an emergency. Do not wait to see if the symptoms will go away. Get medical help right away. Call your local emergency services (911 in the [place] Do not drive yourself to the hospital. °  °This information is not intended to replace advice given to you by your health care provider. Make sure you discuss any questions you have with your health care provider. °  °Document Released: 08/17/2007 Document Revised: 07/15/2014 Document Reviewed: 07/02/2013 °Elsevier Interactive Patient Education ©2016 [employer] ° °

## 2015-06-30 NOTE — Progress Notes (Signed)
Inpatient Diabetes Program Recommendations  AACE/ADA: New Consensus Statement on Inpatient Glycemic Control (2015)  Target Ranges:  Prepandial:   less than 140 mg/dL      Peak postprandial:   less than 180 mg/dL (1-2 hours)      Critically ill patients:  140 - 180 mg/dL   Review of Glycemic Control:  Results for ANDRUW, RAFFERTY (MRN [id number]) as of 06/30/2015 10:20  Ref. Range 06/29/2015 21:27  Glucose-Capillary Latest Ref Range: 65-99 mg/dL 211 (H)   Diabetes history: Uncontrolled Diabetes without complication Outpatient Diabetes medications: Metformin 1000 mg bid, Actos 45 mg daily Current orders for Inpatient glycemic control:  Metformin 1000 mg bid, Actos 45 mg daily Inpatient Diabetes Program Recommendations:   Please add Novolog moderate tid with meals and HS. Sent text page to Dr. Charlies Silvers.  Thanks, Adah Perl, RN, BC-ADM Inpatient Diabetes Coordinator Pager [phone] (8a-5p)

## 2015-06-30 NOTE — Progress Notes (Signed)
Patient Name: [name]. Date of Encounter: 06/30/2015  Hospital Problem List     Principal Problem:   Chest pain Active Problems:   OSA on CPAP   Uncontrolled diabetes mellitus without complication, without long-term current use of insulin (HCC)   Depression   BPH (benign prostatic hyperplasia)   Pt Profile:  70 yo male former smoker with PMH of s/p gastric bypass surgery (2009) DM/OSA on CPAP/depression/BPH/arthritis who presented to the ED with complaints of headache, nausea, left arm/jaw pain on 06/29/2015.   Subjective   Pt reports feeling well throughout the night. No more episodes of chest pain or left arm pain.   Inpatient Medications    . cholecalciferol  5,000 Units Oral Daily  . diphenhydrAMINE  50 mg Oral BID  . dutasteride  0.5 mg Oral Daily  . fluticasone  2 spray Each Nare Daily  . heparin  5,000 Units Subcutaneous 3 times per day  . metFORMIN  1,000 mg Oral BID WC  . mometasone-formoterol  2 puff Inhalation BID  . pioglitazone  45 mg Oral Daily  . primidone  50 mg Oral TID  . sertraline  200 mg Oral Daily  . tamsulosin  0.4 mg Oral QPC breakfast    Vital Signs    Filed Vitals:   06/29/15 1830 06/29/15 1923 06/29/15 2200 06/29/15 2325  BP: 143/74  149/79   Pulse: 59 72 56 57  Temp:  98.2 F (36.8 C)    TempSrc:  Oral    Resp: 16   20  Height:  6\' 2"  (1.88 m)    Weight:  263 lb 11.2 oz (119.614 kg)    SpO2: 99% 100%  99%   No intake or output data in the 24 hours ending 06/30/15 0858 Filed Weights   06/29/15 1046 06/29/15 1923  Weight: 256 lb (116.121 kg) 263 lb 11.2 oz (119.614 kg)    Physical Exam    General: Pleasant male, NAD. Neuro: Alert and oriented X 3. Moves all extremities spontaneously. Psych: Normal affect. HEENT:  Normal  Neck: Supple without bruits or JVD. Lungs:  Resp regular and unlabored, CTA. Heart: RRR no s3, s4, or murmurs. Abdomen: Soft, non-tender, non-distended, BS + x 4.  Extremities: No clubbing,  cyanosis or edema. DP/PT/Radials 2+ and equal bilaterally.  Labs    CBC  Recent Labs  06/29/15 1122 06/29/15 1929  WBC 5.7 6.8  HGB 11.6* 11.7*  HCT 35.8* 36.7*  MCV 86.1 85.7  PLT 268 [id number]   Basic Metabolic Panel  Recent Labs  06/29/15 1122 06/29/15 1929  NA 138  --   K 4.3  --   CL 104  --   CO2 22  --   GLUCOSE 262*  --   BUN 14  --   CREATININE 0.92 0.89  CALCIUM 9.0  --    Cardiac Enzymes  Recent Labs  06/29/15 1929 06/30/15 0121  TROPONINI <0.03 <0.03   Fasting Lipid Panel  Recent Labs  06/29/15 1929  CHOL 218*  HDL 42  LDLCALC 141*  TRIG 173*  CHOLHDL 5.2    Telemetry    SR Rate-75, 3 beat run of VT, Bi/Trigemy  ECG    SR Rate-77 RBBB, with PVCs  Radiology    Dg Chest 2 View  06/29/2015  CLINICAL DATA:  Chest pain and left arm pain and left jaw pain. EXAM: CHEST  2 VIEW COMPARISON:  Chest x-ray dated 04/18/2011 and report of chest x-ray dated 01/24/2014  FINDINGS: Heart size and pulmonary vascularity are normal. Chronic accentuation of the interstitial markings and peribronchial thickening. No infiltrates or effusions. No bone abnormality. IMPRESSION: Probable chronic interstitial lung disease. Electronically Signed   By: Lorriane Shire M.D.   On: 06/29/2015 11:12    Assessment & Plan    1. Unstable Angina: Reports having left arm pain that radiated down to the elbow on 2 separate occassions, the first being last Wednesday after working in the yard that continued for 2 hours after rest, and the second being on Sunday after again working in the yard and emptying bags of cut grass. The second episode was more severe with associated symptoms of nausea, weakness and headache that persisted into the evening. Also reports having an elevated BP of 152/90 which he states is above his baseline. Trops have been cycled throughout the evening and remain normal, Cr and Hgb are at baseline. VSS. Given his symptoms and negative enzymes, I believe he would  benefit from a Lexiscan at this time.   2. PVCs: continues to have frequent PVCs through the night, with 3 beat run of VT, with bi/trigmey. Is asymptomatic with this. Consider starting patient on Low dose BB to see if this improves, previous mention of checking a Mg level. Will order this.   3. DM: Metformin was held last night in the event of possible cath today. Would restart this today if lexiscan is decided.   4. HLD: Lipid panel shows elevated cholesterol, trigs, and LDL. Would start high dose statin.    Signed, Kvion Lell NP-C Pager [phone]  I have seen and examined the patient along with Reino Bellis NP-C.  I have reviewed the chart, notes and new data.  I agree with NP's note.  Key new complaints: he has had a few episodes of symptoms that could be compatible with angina, but each time the symptom complex has been different, which would be highly unusual (left armpit and shoulder pain with finger paresthesia after exercise a week ago sounds like cervical spine syndrome, nausea and headache and later clamminess on Sunday, left jaw pain awaking him from sleep at [place] on Monday). He has occasional awareness of palpitations. Inactive due to orthopedic issues. 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 Check echo and Lexiscan Myoview. Beta blockers added. 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 Will try to accommodate that if his tests are OK.  Sanda Klein, MD, [place] [phone] 06/30/2015, 11:02 AM

## 2015-06-30 NOTE — Progress Notes (Signed)
Echo   LV EF: 65% - 70%  ------------------------------------------------------------------- Indications: Chest pain 786.51.  ------------------------------------------------------------------- History: Risk factors: Diabetes mellitus.  ------------------------------------------------------------------- Study Conclusions  - Left ventricle: The cavity size was normal. Wall thickness was  increased in a pattern of mild LVH. Systolic function was  vigorous. The estimated ejection fraction was in the range of 65%  to 70%. Wall motion was normal; there were no regional wall  motion abnormalities. The study is not technically sufficient to  allow evaluation of LV diastolic function. - Mitral valve: There was mild regurgitation. - Left atrium: The atrium was mildly dilated.    Myoview 06/30/2015   Study Result      There was no ST segment deviation noted during stress.  This is a low risk study.  Nuclear stress EF: 56%.  No T wave inversion was noted during stress.  Normal perfusion. LVEF 56%. PVC's noted during the lexiscan stress portion. This is a low risk study.       Workup is negative. Patient is is cleared to discharge from cardiology perspective. Followup with Cardiology on an as needed basis./    Hilbert Corrigan [place] Pager: [phone]

## 2015-06-30 NOTE — Care Management Obs Status (Signed)
[place] NOTIFICATION   Patient Details  Name: [name]. MRN: [id number] Date of Birth: [birth year]   Medicare Observation Status Notification Given:  Yes    Dawayne Patricia, [place] 06/30/2015, 2:32 PM

## 2015-06-30 NOTE — Progress Notes (Signed)
1 day lexiscan myoview completed without complication. Reading result by [facility]  Signed, Almyra Deforest PA Pager: [phone]

## 2015-07-01 LAB — HEMOGLOBIN A1C
HEMOGLOBIN A1C: 8 % — AB (ref 4.8–5.6)
Mean Plasma Glucose: 183 mg/dL

## 2015-07-13 ENCOUNTER — Encounter: Payer: Self-pay | Admitting: Physician Assistant

## 2015-07-13 ENCOUNTER — Ambulatory Visit (INDEPENDENT_AMBULATORY_CARE_PROVIDER_SITE_OTHER): Payer: Medicare Other | Admitting: Physician Assistant

## 2015-07-13 VITALS — BP 160/100 | HR 52 | Ht 74.0 in | Wt 270.0 lb

## 2015-07-13 DIAGNOSIS — E669 Obesity, unspecified: Secondary | ICD-10-CM | POA: Diagnosis not present

## 2015-07-13 DIAGNOSIS — R001 Bradycardia, unspecified: Secondary | ICD-10-CM

## 2015-07-13 DIAGNOSIS — I493 Ventricular premature depolarization: Secondary | ICD-10-CM

## 2015-07-13 DIAGNOSIS — Z9989 Dependence on other enabling machines and devices: Secondary | ICD-10-CM

## 2015-07-13 DIAGNOSIS — G4733 Obstructive sleep apnea (adult) (pediatric): Secondary | ICD-10-CM

## 2015-07-13 DIAGNOSIS — I200 Unstable angina: Secondary | ICD-10-CM

## 2015-07-13 MED ORDER — METOPROLOL TARTRATE 25 MG PO TABS: 25.0000 mg | ORAL_TABLET | Freq: Two times a day (BID) | ORAL | Status: DC

## 2015-07-13 NOTE — Patient Instructions (Signed)
Your physician has recommended you make the following change in your medication:   1.) the metoprolol has been increased from 1/2 to 1 tablet twice a day. A new prescription has been sent to your pharmacy to reflect this change.  Your physician recommends that you schedule a follow-up appointment in: 2-3 months with Dr Sallyanne Kuster.

## 2015-07-13 NOTE — Progress Notes (Signed)
Patient ID: Ronald Pavlov., male   DOB: [birth year], 70 y.o.   MRN: [id number]    Date:  07/13/2015   ID:  Ronald Pavlov., DOB 30-Nov-1945, MRN [id number]  PCP:  Wardell Honour, MD  Primary Cardiologist:  Croitoru:  New  Chief Complaint  Patient presents with  . Follow-up    tachycardia every day     History of Present Illness: Ronald Leaper. is a 70 y.o. male with history of obstructive sleep apnea, obesity, depression Thomas bulging cervical disc, diabetes mellitus with last hemoglobin A1c of 8.0. Patient was hospitalized 2 weeks ago with unstable angina reported that time having left arm pain radiating down to the elbow on 2 prior occasions. He ruled out for MI and underwent nuclear stress testing which was normal. He was also noted to have frequent PVCs and some bigeminy/trigeminy. He was started on a low-dose of beta blocker. Reports that usually his blood pressure has always been in the 120s but while he was in the hospital, and today in the office, it was elevated up in the 150s and 160s. He Presented today for posthospital follow-up. He brought in a chart of his blood pressures in the range from [id number] systolically. Heart rates in the 44 to the 60s.  According to his wife, who was with him today, he eats a very unhealthy diet. Patient admits she likes bales and eats them just about every day.  The patient currently denies nausea, vomiting, fever, chest pain, shortness of breath, orthopnea, dizziness, PND, cough, congestion, abdominal pain, hematochezia, melena, lower extremity edema, claudication.  Wt Readings from Last 3 Encounters:  07/13/15 270 lb (122.471 kg)  06/29/15 263 lb 11.2 oz (119.614 kg)  05/19/15 261 lb 6.4 oz (118.57 kg)     Past Medical History  Diagnosis Date  . Sleep apnea     settings 10.6  / followed by [name] study years ago  . Recurrent upper respiratory infection (URI)     states Dr Wynelle Link aware- no fever- states is improving  .  Arthritis   . Depression     ADD  . Tremor     d/t lithium  . Bronchitis     hx of  . Insomnia   . BPH (benign prostatic hyperplasia)   . Complication of anesthesia [id number]    no complications but just says he typically needs "more than expected" to anesthetize; needs CPAP (setting 10) in recovery  . Bulge of cervical disc without myelopathy   . Asthma     anxiety, tree/grass pollen  . Diabetes mellitus     PCP [name], type 2    Current Outpatient Prescriptions  Medication Sig Dispense Refill  . acetaminophen (TYLENOL) 500 MG tablet Take 1,000 mg by mouth daily as needed for headache.    . ALPRAZolam (XANAX) 0.5 MG tablet Take 0.5-1 mg by mouth 3 (three) times daily as needed for anxiety. Reported on 05/19/2015    . aspirin EC 81 MG tablet Take 81 mg by mouth daily as needed for mild pain.    Marland Kitchen atorvastatin (LIPITOR) 80 MG tablet Take 1 tablet (80 mg total) by mouth daily at 6 PM. 30 tablet 0  . budesonide-formoterol (SYMBICORT) 160-4.5 MCG/ACT inhaler Inhale 1 puff into the lungs 2 (two) times daily. Reported on 05/19/2015 1 Inhaler 1  . Cholecalciferol (VITAMIN D3) 5000 units CAPS Take 5,000 Units by mouth daily.    . diphenhydrAMINE (BENADRYL) 25 MG tablet Take  50 mg by mouth 2 (two) times daily.    Marland Kitchen dutasteride (AVODART) 0.5 MG capsule Take 0.5 mg by mouth daily.    . fluticasone (FLONASE) 50 MCG/ACT nasal spray Place 2 sprays into the nose 2 (two) times daily.    Marland Kitchen HYDROcodone-acetaminophen (NORCO) 10-325 MG per tablet Take 1-2 tablets by mouth every 6 (six) hours as needed for moderate pain. 90 tablet 0  . metFORMIN (GLUCOPHAGE) 500 MG tablet Take 1,000 mg by mouth 2 (two) times daily with a meal.    . metoprolol tartrate (LOPRESSOR) 25 MG tablet Take 1 tablet (25 mg total) by mouth 2 (two) times daily. 60 tablet 6  . Multiple Vitamins-Minerals (CENTRUM SILVER ADULT 50+ PO) Take 1 tablet by mouth daily.    . pioglitazone (ACTOS) 45 MG tablet Take 45 mg by mouth daily.     .  primidone (MYSOLINE) 50 MG tablet Take 1 tablet (50 mg total) by mouth 3 (three) times daily. 90 tablet 11  . sertraline (ZOLOFT) 100 MG tablet Take 200 mg by mouth daily.     . tamsulosin (FLOMAX) 0.4 MG CAPS capsule Take 0.4 mg by mouth daily.     Marland Kitchen tiZANidine (ZANAFLEX) 4 MG tablet Take 1 tablet (4 mg total) by mouth every 6 (six) hours as needed for muscle spasms. 40 tablet 0   No current facility-administered medications for this visit.    Allergies:    Allergies  Allergen Reactions  . Breo Ellipta [Fluticasone Furoate-Vilanterol] Other (See Comments)    Thrush  . Demerol [Meperidine] Itching  . Lamotrigine Hives and Itching  . Nsaids Nausea Only    Gastric bypass    Social History:  The patient  reports that he quit smoking about 25 years ago. His smoking use included Cigarettes. He has a 30 pack-year smoking history. He has never used smokeless tobacco. He reports that he does not drink alcohol or use illicit drugs.   Family history:   Family History  Problem Relation Age of Onset  . Asthma Maternal Grandmother   . Heart disease Father   . Anxiety disorder Mother     ROS:  Please see the history of present illness.  All other systems reviewed and negative.   PHYSICAL EXAM: VS:  BP 160/100 mmHg  Pulse 52  Ht 6\' 2"  (1.88 m)  Wt 270 lb (122.471 kg)  BMI 34.65 kg/m2 Obese, well developed, in no acute distress HEENT: Pupils are equal round react to light accommodation extraocular movements are intact.  Neck: no JVDNo cervical lymphadenopathy. Cardiac: Regularly irregular with no murmurs rubs or gallops. Lungs:  clear to auscultation bilaterally, no wheezing, rhonchi or rales Abd: soft, nontender, positive bowel sounds all quadrants, no hepatosplenomegaly Ext: 1+ lower extremity edema.  2+ radial and dorsalis pedis pulses. Skin: warm and dry Neuro:  Grossly normal  EKG:  Trigeminy rate 52 bpm right bundle branch block.  ASSESSMENT AND PLAN:  Problem List Items  Addressed This Visit    PVC's (premature ventricular contractions)   Relevant Medications   metoprolol tartrate (LOPRESSOR) 25 MG tablet   OSA on CPAP   Obesity (BMI 30.0-34.9)    Other Visit Diagnoses    Bradycardia    -  Primary    Relevant Orders    EKG 12-Lead      Patient is an trigeminy here in the office on his EKG. Will go ahead and try bump in his Lopressor to 25 mg twice daily. We discussed continuing to monitor  his heart rate and blood pressure. If he notices significant decrease in his heart rate, and comes symptomatic, I told him to reduce it back to 12.5 mg.  His blood pressure is elevated today however most of his blood pressures on his chart were under 130/80. As far as his weight goes we discussed proceeding on low carb diet and decreasing his sodium intake. After talking to his wife, it sounds as though he has total dietary indiscretion. His last hemoglobin A1c was 8.0.  On follow-up in 3 months.

## 2015-07-21 DIAGNOSIS — R079 Chest pain, unspecified: Secondary | ICD-10-CM | POA: Diagnosis not present

## 2015-07-21 DIAGNOSIS — Z09 Encounter for follow-up examination after completed treatment for conditions other than malignant neoplasm: Secondary | ICD-10-CM | POA: Diagnosis not present

## 2015-08-04 DIAGNOSIS — M961 Postlaminectomy syndrome, not elsewhere classified: Secondary | ICD-10-CM | POA: Diagnosis not present

## 2015-08-04 DIAGNOSIS — G894 Chronic pain syndrome: Secondary | ICD-10-CM | POA: Diagnosis not present

## 2015-08-04 DIAGNOSIS — Z79891 Long term (current) use of opiate analgesic: Secondary | ICD-10-CM | POA: Diagnosis not present

## 2015-09-14 ENCOUNTER — Ambulatory Visit (INDEPENDENT_AMBULATORY_CARE_PROVIDER_SITE_OTHER): Payer: Medicare Other | Admitting: Neurology

## 2015-09-14 ENCOUNTER — Encounter: Payer: Self-pay | Admitting: Neurology

## 2015-09-14 VITALS — BP 110/60 | HR 66 | Ht 74.0 in | Wt 260.0 lb

## 2015-09-14 DIAGNOSIS — E1142 Type 2 diabetes mellitus with diabetic polyneuropathy: Secondary | ICD-10-CM

## 2015-09-14 DIAGNOSIS — I200 Unstable angina: Secondary | ICD-10-CM | POA: Diagnosis not present

## 2015-09-14 DIAGNOSIS — R251 Tremor, unspecified: Secondary | ICD-10-CM

## 2015-09-14 NOTE — Progress Notes (Signed)
Subjective:   [name]. was seen in consultation in the movement disorder clinic at the request of Dwan Bolt, MD.  The evaluation is for tremor.  Pt is accompanied by his wife who supplements the history.  The patient has previously seen Dr. Krista Blue and Dr. Janann Colonel.   He has seen Dr. Krista Blue since 2011.  Records that were made available to me were reviewed.  The patient has had tremor since approximately 2008.  Pt reports that it initially started just in his fingers but has progressed over time to involve the hands and legs.  It is bilateral.  His tremor has been felt to due to psychiatric medications such as lithium, Depakote (off now), and methylphenidate.  He had been tried on propranolol in the past, but ultimately developed asthma and the propranolol had to be discontinued.  He was then off of tremor medications for many years.  He started seeing Dr. Janann Colonel in June, 2015 for the only visit that he saw him and was placed on primidone.  He is currently on primidone 50 mg daily.  He does think that it helps but has read pt pamphlet and worries it will exacerbate depression.  He can write in cursive now that he has primidone.  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   08/12/14 update:  Pt is present today for f/u.  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  Is on primidone for tremor control and this was increased to 50 mg bid last visit.  Pt states that the increase did help, especially the amplitude of the tremor but he states that perhaps it hasn't changed the frequency of the tremor.  "My cursive signature is far better."  He states that he has cut down on his lithium and thinks that his mood is the same.  His psychiatrist left and he has to change care. Pt wants off of the lithium and thinks that he doesn't need it.  He continues to worry about the fact his old head injuries contribute to tremor, along with loss of memory.  His wife is  currently in [place] for 3 weeks but his wifes sister is checking on him.  He worries about that.    03/17/15 update:  The patient is following up today in regards to tremor, which has been presumed secondary to lithium.  He reports that he found a new psychiatrist and he is off of the lithium now.  He was bothered by the tremor last visit and wanted to increase his primidone (was on lithium then).  He is therefore on 100 mg in the morning and 50 mg at night.   He states that "three tablets worked perfectly."  He states that he can write cursive now and was able to resume photophography.  He has no SE and needs RF today.   I have reviewed records since last visit.  He had a left total hip replacement in August, 2016.  He states that this replacement was "easier" than the opposite side.  He is now seeing neurosx and he just had an MRI and is wondering if he will need back surgery again.    09/14/15 update:  The patient is following up today in regards to tremor, which has been presumed secondary to lithium.  Last visit, he had been off of lithium and Ritalin, and the tremor was much better.  I suggested that we start tapering the primidone.  He was very nervous about that, but I told him I felt we would be able to get off of it.  He was previously on primidone, 100 mg during the day and 50 mg at night.  The patient was willing to at least try to go down to 50 g twice a day.  He states that his tremor is still well controlled.  He states that he did try to go to one tablet and "my cursive wasn't as good."  I did review records since last visit.  The patient was hospitalized in April with chest pain and placed on metoprolol and a statin medication.  His nuclear stress test was negative.  He followed up with cardiology and he was in trigeminy at the time and his Lopressor was subsequently increased to 25 mg twice a day.  He states that when he went down on the primidone, he wasn't on the metoprolol yet.     Current/Previously tried tremor medications: propranolol (developed asthma and had to d/c); on primidone  Current medications that may exacerbate tremor:  Lithium, albuterol, methylphenidate  Outside reports reviewed: historical medical records and referral letter/letters.  Allergies  Allergen Reactions  . Breo Ellipta [Fluticasone Furoate-Vilanterol] Other (See Comments)    Thrush  . Demerol [Meperidine] Itching  . Lamotrigine Hives and Itching  . Nsaids Nausea Only    Gastric bypass    Outpatient Encounter Prescriptions as of 09/14/2015  Medication Sig  . acetaminophen (TYLENOL) 500 MG tablet Take 1,000 mg by mouth daily as needed for headache.  . ALPRAZolam (XANAX) 0.5 MG tablet Take 0.5-1 mg by mouth 3 (three) times daily as needed for anxiety. Reported on 05/19/2015  . aspirin EC 81 MG tablet Take 81 mg by mouth daily as needed for mild pain.  . budesonide-formoterol (SYMBICORT) 160-4.5 MCG/ACT inhaler Inhale 1 puff into the lungs 2 (two) times daily. Reported on 05/19/2015  . diphenhydrAMINE (BENADRYL) 25 MG tablet Take 50 mg by mouth 2 (two) times daily.  Marland Kitchen dutasteride (AVODART) 0.5 MG capsule Take 0.5 mg by mouth daily.  . fluticasone (FLONASE) 50 MCG/ACT nasal spray Place 2 sprays into the nose 2 (two) times daily.  Marland Kitchen HYDROcodone-acetaminophen (NORCO) 10-325 MG per tablet Take 1-2 tablets by mouth every 6 (six) hours as needed for moderate pain.  . metFORMIN (GLUCOPHAGE) 500 MG tablet Take 1,000 mg by mouth 2 (two) times daily with a meal.  . metoprolol tartrate (LOPRESSOR) 25 MG tablet Take 1 tablet (25 mg total) by mouth 2 (two) times daily.  . Multiple Vitamins-Minerals (CENTRUM SILVER ADULT 50+ PO) Take 1 tablet by mouth daily.  . pioglitazone (ACTOS) 45 MG tablet Take 45 mg by mouth daily.   . primidone (MYSOLINE) 50 MG tablet Take 1 tablet (50 mg total) by mouth 3 (three) times daily. (Patient taking differently: Take 50 mg by mouth 2 (two) times daily. )  . sertraline  (ZOLOFT) 100 MG tablet Take 200 mg by mouth daily.   . tamsulosin (FLOMAX) 0.4 MG CAPS capsule Take 0.4 mg by mouth daily.   Marland Kitchen tiZANidine (ZANAFLEX) 4 MG tablet Take 1 tablet (4 mg total) by mouth every 6 (six) hours as needed for muscle spasms.  . [DISCONTINUED] atorvastatin (LIPITOR) 80 MG tablet Take 1 tablet (80 mg total) by mouth daily at 6 PM.  . [DISCONTINUED] Cholecalciferol (VITAMIN D3) 5000 units CAPS Take 5,000 Units by mouth daily.   No facility-administered encounter medications on file as of 09/14/2015.    Past Medical  History  Diagnosis Date  . Sleep apnea     settings 10.6  / followed by [name] study years ago  . Recurrent upper respiratory infection (URI)     states Dr Wynelle Link aware- no fever- states is improving  . Arthritis   . Depression     ADD  . Tremor     d/t lithium  . Bronchitis     hx of  . Insomnia   . BPH (benign prostatic hyperplasia)   . Complication of anesthesia Q000111Q    no complications but just says he typically needs "more than expected" to anesthetize; needs CPAP (setting 10) in recovery  . Bulge of cervical disc without myelopathy   . Asthma     anxiety, tree/grass pollen  . Diabetes mellitus     PCP [name], type 2    Past Surgical History  Procedure Laterality Date  . Cardiac catheterization      2008 pre op gastric bypass  . Joint replacement      Left, Right Knee, Right hip  . Knee arthroscopy      right x 3-4, left x 2  . Knee arthrotomy  ~1991    right  . 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 Polypectomy      vocal cords 1992  . Total hip revision  04/22/2011    Procedure: TOTAL HIP REVISION;  Surgeon: Gearlean Alf, MD;  Location: [facility];  Service: Orthopedics;  Laterality: Right;  . Colonoscopy w/ polypectomy    . Lumbar fusion  04/03/2013    [name], L5-S1  . Total hip arthroplasty Left 10/21/2014    Procedure: LEFT TOTAL HIP ARTHROPLASTY ANTERIOR APPROACH;  Surgeon: [name], MD;  Location: [facility];   Service: Orthopedics;  Laterality: Left;    Social History   Social History  . Marital Status: Married    Spouse Name: N/A  . Number of Children: N/A  . Years of Education: college   Occupational History  . Retired Mathmetician    Social History Main Topics  . Smoking status: Former Smoker -- 1.50 packs/day for 20 years    Types: Cigarettes    Quit date: 04/17/1990  . Smokeless tobacco: Never Used  . Alcohol Use: No  . Drug Use: No  . Sexual Activity: Not on file   Other Topics Concern  . Not on file   Social History Narrative   Patient works for [employer]. Patient has masters degree. Patient is married.    Family Status  Relation Status Death Age  . Father Deceased     heart disease  . Mother Deceased     chronic depression    Review of Systems   A complete 10 system ROS was obtained and was negative apart from what is mentioned.   Objective:   VITALS:   Filed Vitals:   09/14/15 0815  BP: 110/60  Pulse: 66  Height: 6\' 2"  (1.88 m)  Weight: 260 lb (117.935 kg)   Gen:  Appears stated age and in NAD. HEENT:  Normocephalic, atraumatic. The mucous membranes are moist. The superficial temporal arteries are without ropiness or tenderness. Cardiovascular: Regular rate and rhythm. Lungs: Clear to auscultation bilaterally. Neck: There are no carotid bruits noted bilaterally.  NEUROLOGICAL:  Orientation:  The patient is alert and oriented x 3.  He is verbose and has trouble focusing and concentrating on the topic at hand. Cranial nerves: There is good facial symmetry. Extraocular  muscles are intact and visual fields are full to confrontational testing. Speech is fluent and clear. Soft palate rises symmetrically and there is no tongue deviation. Hearing is intact to conversational tone. Tone: Tone is good throughout. Sensation: Sensation is intact to light touch throughout. Coordination:  The patient no dysmetria.  No trouble with hand opening/closing, finger taps, toe  taps, heel taps bilaterally.  Minimal trouble with alternation supination/pronation of forearm on the left only. Motor: Strength is 5/5 in the bilateral upper and lower extremities.  Shoulder shrug is equal bilaterally.  There is no pronator drift.  There are no fasciculations noted.   MOVEMENT EXAM: Tremor:  There is no tremor in the UE.  The patient is able to draw Archimedes spirals without significant difficulty.  He is able to hold an object of weight with no trouble.     Lab Results  Component Value Date   HGBA1C 8.0* 06/29/2015   Lab Results  Component Value Date   NA 138 06/29/2015   BUN 14 06/29/2015   CREATININE 0.89 06/29/2015   WBC 6.8 06/29/2015    Assessment/Plan:   1.   Tremor.  -He is now off of lithium and methylphenidate.  Told him again he could likely get off of primidone as well then but he was really nervous about that.  He is down to primidone 50 mg bid.  He felt that he was worse when he backed down to qday.  However, he had started on metoprolol since last visit for trigeminy.  This may help tremor as well so told him to back down again and potentially d/c the medication.  He will do this and let me know.     2.  Diabetic PN  - Talked about importance of diabetic control as he is not watching diet.  Talked about diet/exercise.   3. Follow up in next 6 months, sooner should new neuro issues arise.

## 2015-10-01 ENCOUNTER — Encounter: Payer: Self-pay | Admitting: Cardiovascular Disease

## 2015-10-01 ENCOUNTER — Ambulatory Visit (INDEPENDENT_AMBULATORY_CARE_PROVIDER_SITE_OTHER): Payer: Medicare Other | Admitting: Cardiovascular Disease

## 2015-10-01 VITALS — BP 118/78 | HR 76 | Ht 74.0 in | Wt 257.8 lb

## 2015-10-01 DIAGNOSIS — Z9989 Dependence on other enabling machines and devices: Secondary | ICD-10-CM

## 2015-10-01 DIAGNOSIS — I493 Ventricular premature depolarization: Secondary | ICD-10-CM

## 2015-10-01 DIAGNOSIS — E785 Hyperlipidemia, unspecified: Secondary | ICD-10-CM

## 2015-10-01 DIAGNOSIS — E66811 Obesity, class 1: Secondary | ICD-10-CM

## 2015-10-01 DIAGNOSIS — G4733 Obstructive sleep apnea (adult) (pediatric): Secondary | ICD-10-CM

## 2015-10-01 DIAGNOSIS — I517 Cardiomegaly: Secondary | ICD-10-CM | POA: Diagnosis not present

## 2015-10-01 DIAGNOSIS — E669 Obesity, unspecified: Secondary | ICD-10-CM

## 2015-10-01 DIAGNOSIS — IMO0001 Reserved for inherently not codable concepts without codable children: Secondary | ICD-10-CM

## 2015-10-01 DIAGNOSIS — I200 Unstable angina: Secondary | ICD-10-CM

## 2015-10-01 DIAGNOSIS — E1165 Type 2 diabetes mellitus with hyperglycemia: Secondary | ICD-10-CM

## 2015-10-01 NOTE — Patient Instructions (Signed)
Dr Croitoru recommends that you follow-up with him as needed. 

## 2015-10-01 NOTE — Progress Notes (Signed)
Cardiology Office Note    Date:  10/01/2015   ID:  Ronald Pavlov., DOB 1945-07-05, MRN [id number]  PCP:  Dwan Bolt, MD  Cardiologist:   Ronald Klein, MD   Chief Complaint  Patient presents with  . Follow-up    lightheaded/dizziness; occasionally.    History of Present Illness:  Ronald Vizzini. is a 70 y.o. male with frequent PVCs, but without detected structural heart disease. He is unaware of the arrhythmia except if he checks his radial pulse. He does not have palpitations. He denies dizziness associated with irregular rhythm, although this happens occasionally when he bends over and stands up quickly. He has not experienced syncope. He denies angina or dyspnea either at rest or with activity.  His PVCs have occasional lead to diagnosis of "bradycardia" when he settles into a pattern of ventricular bigeminy and PVCs did not produce a palpable pulse. He was poorly tolerant of beta blockers due to reactive airway disease.  He reports compliance with CPAP for obstructive sleep apnea. Most of his complaints today are orthopedic in nature.  He has 2 diabetes mellitus and hyperlipidemia but does not have congestive heart failure or coronary disease. He had a normal nuclear stress test in April 2017. His echocardiogram was also essentially normal with the exception of mild left atrial dilatation and "mild left ventricular hypertrophy". There were no findings suggestive of hypertrophic obstructive cardiomyopathy. After he left the office today I reviewed the images myself and I believe I see some discrepancies with the interpretation. I don't think that he has left ventricular hypertrophy. On my measurements the septum and posterior wall are both normal in thickness. I do believe there is mild mitral valve prolapse with mild to moderate late systolic eccentric mitral insufficiency directed towards the posterior wall of the left atrium.   Past Medical History  Diagnosis Date   . Sleep apnea     settings 10.6  / followed by [name] study years ago  . Recurrent upper respiratory infection (URI)     states Dr Wynelle Link aware- no fever- states is improving  . Arthritis   . Depression     ADD  . Tremor     d/t lithium  . Bronchitis     hx of  . Insomnia   . BPH (benign prostatic hyperplasia)   . Complication of anesthesia Q000111Q    no complications but just says he typically needs "more than expected" to anesthetize; needs CPAP (setting 10) in recovery  . Bulge of cervical disc without myelopathy   . Asthma     anxiety, tree/grass pollen  . Diabetes mellitus     PCP [name], type 2    Past Surgical History  Procedure Laterality Date  . Cardiac catheterization      2008 pre op gastric bypass  . Joint replacement      Left, Right Knee, Right hip  . Knee arthroscopy      right x 3-4, left x 2  . Knee arthrotomy  ~1991    right  . 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 Polypectomy      vocal cords 1992  . Total hip revision  04/22/2011    Procedure: TOTAL HIP REVISION;  Surgeon: Gearlean Alf, MD;  Location: [facility];  Service: Orthopedics;  Laterality: Right;  . Colonoscopy w/ polypectomy    . Lumbar fusion  04/03/2013    [name], L5-S1  .  Total hip arthroplasty Left 10/21/2014    Procedure: LEFT TOTAL HIP ARTHROPLASTY ANTERIOR APPROACH;  Surgeon: [name], MD;  Location: [facility];  Service: Orthopedics;  Laterality: Left;    Current Medications: Outpatient Prescriptions Prior to Visit  Medication Sig Dispense Refill  . acetaminophen (TYLENOL) 500 MG tablet Take 1,000 mg by mouth daily as needed for headache.    . ALPRAZolam (XANAX) 0.5 MG tablet Take 0.5-1 mg by mouth 3 (three) times daily as needed for anxiety. Reported on 05/19/2015    . aspirin EC 81 MG tablet Take 81 mg by mouth daily as needed for mild pain.    . budesonide-formoterol (SYMBICORT) 160-4.5 MCG/ACT inhaler Inhale 1 puff into the lungs 2 (two) times daily. Reported on  05/19/2015 1 Inhaler 1  . diphenhydrAMINE (BENADRYL) 25 MG tablet Take 50 mg by mouth 2 (two) times daily.    Marland Kitchen dutasteride (AVODART) 0.5 MG capsule Take 0.5 mg by mouth daily.    . fluticasone (FLONASE) 50 MCG/ACT nasal spray Place 2 sprays into the nose 2 (two) times daily.    Marland Kitchen HYDROcodone-acetaminophen (NORCO) 10-325 MG per tablet Take 1-2 tablets by mouth every 6 (six) hours as needed for moderate pain. 90 tablet 0  . metFORMIN (GLUCOPHAGE) 500 MG tablet Take 1,000 mg by mouth 2 (two) times daily with a meal.    . Multiple Vitamins-Minerals (CENTRUM SILVER ADULT 50+ PO) Take 1 tablet by mouth daily.    . pioglitazone (ACTOS) 45 MG tablet Take 45 mg by mouth daily.     . primidone (MYSOLINE) 50 MG tablet Take 1 tablet (50 mg total) by mouth 3 (three) times daily. (Patient taking differently: Take 50 mg by mouth 2 (two) times daily. ) 90 tablet 11  . sertraline (ZOLOFT) 100 MG tablet Take 200 mg by mouth daily.     . tamsulosin (FLOMAX) 0.4 MG CAPS capsule Take 0.4 mg by mouth daily.     Marland Kitchen tiZANidine (ZANAFLEX) 4 MG tablet Take 1 tablet (4 mg total) by mouth every 6 (six) hours as needed for muscle spasms. 40 tablet 0  . metoprolol tartrate (LOPRESSOR) 25 MG tablet Take 1 tablet (25 mg total) by mouth 2 (two) times daily. 60 tablet 6   No facility-administered medications prior to visit.     Allergies:   Breo ellipta; Demerol; Lamotrigine; and Nsaids   Social History   Social History  . Marital Status: Married    Spouse Name: N/A  . Number of Children: N/A  . Years of Education: college   Occupational History  . Retired Mathmetician    Social History Main Topics  . Smoking status: Former Smoker -- 1.50 packs/day for 20 years    Types: Cigarettes    Quit date: 04/17/1990  . Smokeless tobacco: Never Used  . Alcohol Use: No  . Drug Use: No  . Sexual Activity: Not Asked   Other Topics Concern  . None   Social History Narrative   Patient works for [employer]. Patient has masters  degree. Patient is married.     Family History:  The patient's family history includes Anxiety disorder in his mother; Asthma in his maternal grandmother; Heart disease in his father.   ROS:   Please see the history of present illness.    ROS All other systems reviewed and are negative.   PHYSICAL EXAM:   VS:  BP 118/78 mmHg  Pulse 76  Ht 6\' 2"  (1.88 m)  Wt 116.937 kg (257 lb 12.8 oz)  BMI 33.09 kg/m2   GEN: Well nourished, well developed, in no acute distress HEENT: normal Neck: no JVD, carotid bruits, or masses Cardiac: RRR; no murmurs, rubs, or gallops,no edema. On exam today his PVCs are very infrequent. I do not hear a systolic click Respiratory:  clear to auscultation bilaterally, normal work of breathing GI: soft, nontender, nondistended, + BS MS: no deformity or atrophy Skin: warm and dry, no rash Neuro:  Alert and Oriented x 3, Strength and sensation are intact Psych: euthymic mood, full affect  Wt Readings from Last 3 Encounters:  10/01/15 116.937 kg (257 lb 12.8 oz)  09/14/15 117.935 kg (260 lb)  07/13/15 122.471 kg (270 lb)      Studies/Labs Reviewed:   EKG:  EKG is not ordered today.    Recent Labs: 06/29/2015: BUN 14; Creatinine, Ser 0.89; Hemoglobin 11.7*; Platelets 270; Potassium 4.3; Sodium 138   Lipid Panel    Component Value Date/Time   CHOL 218* 06/29/2015 1929   TRIG 173* 06/29/2015 1929   HDL 42 06/29/2015 1929   CHOLHDL 5.2 06/29/2015 1929   VLDL 35 06/29/2015 1929   LDLCALC 141* 06/29/2015 1929     ASSESSMENT:    1. PVC's (premature ventricular contractions)   2. Left ventricular hypertrophy   3. OSA on CPAP   4. Uncontrolled type 2 diabetes mellitus without complication, without long-term current use of insulin (HCC)   5. Obesity (BMI 30.0-34.9)   6. Hyperlipidemia      PLAN:  In order of problems listed above:  1. PVCs: These are asymptomatic and I believe they are benign. He does not appear to have serious structural heart  disease. They do not need to be treated. Avoid excessive doses of beta agonist bronchodilators, over-the-counter cold remedies with decongestants, stimulants such as caffeine, etc. 2. "LVH": I have reviewed his echocardiogram and I disagree with the diagnosis of left ventricular hypertrophy. On my measurements both the septum and the posterior wall only measure 10 mm in thickness at end diastole. On the other hand I agree that there is mild left atrial dilation. I think this is related to a mild degree of prolapse of the anterior leaflet and mild-moderate posteriorly directed eccentric mitral regurgitation. In turn, this might be the cause of his PVCs. 3. MVP: This does not require treatment and is unlikely to ever be a serious problem. The degree of mitral regurgitation is not significant hemodynamically. However it may be worthwhile following up in the future if his symptoms evolve. 4. OSA: Reports compliance with CPAP 5. DM: Needs to focus on better glycemic control 6. Obesity: despite previous gastric bypass. Weight loss would help control of his diabetes. 7. HLP: Target LDL cholesterol less than 100 due to diabetes.    Medication Adjustments/Labs and Tests Ordered: Current medicines are reviewed at length with the patient today.  Concerns regarding medicines are outlined above.  Medication changes, Labs and Tests ordered today are listed in the Patient Instructions below. Patient Instructions  Dr Sallyanne Kuster recommends that you follow-up with him as needed.     Signed, Ronald Klein, MD  10/01/2015 1:41 PM    [facility] [place], [place], [place]  [address] Phone: [phone]; Fax: [phone]

## 2015-10-03 DIAGNOSIS — I517 Cardiomegaly: Secondary | ICD-10-CM | POA: Insufficient documentation

## 2015-10-03 DIAGNOSIS — E782 Mixed hyperlipidemia: Secondary | ICD-10-CM | POA: Insufficient documentation

## 2015-10-05 ENCOUNTER — Telehealth: Payer: Self-pay

## 2015-10-05 DIAGNOSIS — N400 Enlarged prostate without lower urinary tract symptoms: Secondary | ICD-10-CM | POA: Diagnosis not present

## 2015-10-05 DIAGNOSIS — E118 Type 2 diabetes mellitus with unspecified complications: Secondary | ICD-10-CM | POA: Diagnosis not present

## 2015-10-05 DIAGNOSIS — E789 Disorder of lipoprotein metabolism, unspecified: Secondary | ICD-10-CM | POA: Diagnosis not present

## 2015-10-05 NOTE — Telephone Encounter (Signed)
-----   Message from Sanda Klein, MD sent at 10/03/2015  5:56 PM EDT ----- Please tell him that although I told him he only needs to see me "as needed", there are couple of loose ends that I like to talk to him about. One is his cholesterol. I think he should be on medication for it. The other is the fact that after reviewing his echo images myself, I believe I may have an explanation for his PVCs. I think he has mild mitral valve prolapse. This is not serious, it does not require any treatment in particular, but at his convenience I would like to talk to him about it and see him once a year.

## 2015-10-05 NOTE — Telephone Encounter (Signed)
Called patient with recommendations. Patient verbalized understanding. Patient had blood work done this morning (10/05/15) and would like to wait until his results are back before making any medication changes. Appointment made for 11/12/2015 at 10:30a.

## 2015-10-12 DIAGNOSIS — E789 Disorder of lipoprotein metabolism, unspecified: Secondary | ICD-10-CM | POA: Diagnosis not present

## 2015-10-12 DIAGNOSIS — E118 Type 2 diabetes mellitus with unspecified complications: Secondary | ICD-10-CM | POA: Diagnosis not present

## 2015-10-12 DIAGNOSIS — I2510 Atherosclerotic heart disease of native coronary artery without angina pectoris: Secondary | ICD-10-CM | POA: Diagnosis not present

## 2015-10-27 DIAGNOSIS — Z79891 Long term (current) use of opiate analgesic: Secondary | ICD-10-CM | POA: Diagnosis not present

## 2015-10-27 DIAGNOSIS — G894 Chronic pain syndrome: Secondary | ICD-10-CM | POA: Diagnosis not present

## 2015-10-27 DIAGNOSIS — M961 Postlaminectomy syndrome, not elsewhere classified: Secondary | ICD-10-CM | POA: Diagnosis not present

## 2015-11-12 ENCOUNTER — Ambulatory Visit (INDEPENDENT_AMBULATORY_CARE_PROVIDER_SITE_OTHER): Payer: Medicare Other | Admitting: Cardiovascular Disease

## 2015-11-12 ENCOUNTER — Encounter: Payer: Self-pay | Admitting: Cardiovascular Disease

## 2015-11-12 ENCOUNTER — Encounter (INDEPENDENT_AMBULATORY_CARE_PROVIDER_SITE_OTHER): Payer: Self-pay

## 2015-11-12 VITALS — BP 121/74 | HR 57 | Ht 74.0 in | Wt 262.0 lb

## 2015-11-12 DIAGNOSIS — IMO0001 Reserved for inherently not codable concepts without codable children: Secondary | ICD-10-CM

## 2015-11-12 DIAGNOSIS — I200 Unstable angina: Secondary | ICD-10-CM | POA: Diagnosis not present

## 2015-11-12 DIAGNOSIS — G4733 Obstructive sleep apnea (adult) (pediatric): Secondary | ICD-10-CM | POA: Diagnosis not present

## 2015-11-12 DIAGNOSIS — E785 Hyperlipidemia, unspecified: Secondary | ICD-10-CM

## 2015-11-12 DIAGNOSIS — I493 Ventricular premature depolarization: Secondary | ICD-10-CM

## 2015-11-12 DIAGNOSIS — Z9989 Dependence on other enabling machines and devices: Secondary | ICD-10-CM

## 2015-11-12 DIAGNOSIS — E669 Obesity, unspecified: Secondary | ICD-10-CM

## 2015-11-12 DIAGNOSIS — I340 Nonrheumatic mitral (valve) insufficiency: Secondary | ICD-10-CM | POA: Insufficient documentation

## 2015-11-12 DIAGNOSIS — E1165 Type 2 diabetes mellitus with hyperglycemia: Secondary | ICD-10-CM

## 2015-11-12 NOTE — Progress Notes (Signed)
.    Cardiology Office Note    Date:  11/12/2015   ID:  [name]., DOB 15-Sep-1945, MRN [id number]  PCP:  Dwan Bolt, MD  Cardiologist:   Sanda Klein, MD   Chief Complaint  Patient presents with  . Follow-up    Pt states no Sx or concerns.     History of Present Illness:  Jianni Wist. is a 70 y.o. male  turns to discuss treatment for hyperlipidemia and the abnormalities that I identified the review of his echo.  He has mild mitral valve prolapse of the anterior leaflet and mild to moderate posteriorly directed mitral insufficiency. This is probably responsible for mild left atrial dilatation. The echo reports LVH, but I don't think this is really present when I repeated the measurements. His PVCs might be secondary to mitral valve prolapse. It did not cause symptomatic changes (he is unaware of palpitations) but he has detected them with a pulse oximeter.  He reports compliance with CPAP for obstructive sleep apnea. Most of his complaints today are orthopedic in nature.  He has 2 diabetes mellitus and hyperlipidemia but does not have congestive heart failure or coronary disease. He had a normal nuclear stress test in April 2017.  Past Medical History:  Diagnosis Date  . Arthritis   . Asthma    anxiety, tree/grass pollen  . BPH (benign prostatic hyperplasia)   . Bronchitis    hx of  . Bulge of cervical disc without myelopathy   . Complication of anesthesia Q000111Q   no complications but just says he typically needs "more than expected" to anesthetize; needs CPAP (setting 10) in recovery  . Depression    ADD  . Diabetes mellitus    PCP [name], type 2  . Insomnia   . Recurrent upper respiratory infection (URI)    states Dr Wynelle Link aware- no fever- states is improving  . Sleep apnea    settings 10.6  / followed by [name] study years ago  . Tremor    d/t lithium    Past Surgical History:  Procedure Laterality Date  . CARDIAC  CATHETERIZATION     2008 pre op gastric bypass  . COLONOSCOPY W/ POLYPECTOMY    . 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 JOINT REPLACEMENT     Left, Right Knee, Right hip  . KNEE ARTHROSCOPY     right x 3-4, left x 2  . KNEE ARTHROTOMY  ~1991   right  . LUMBAR FUSION  04/03/2013   [name], L5-S1  . POLYPECTOMY     vocal cords 1992  . 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   Procedure: LEFT TOTAL HIP ARTHROPLASTY ANTERIOR APPROACH;  Surgeon: [name], MD;  Location: [facility];  Service: Orthopedics;  Laterality: Left;  . TOTAL HIP REVISION  04/22/2011   Procedure: TOTAL HIP REVISION;  Surgeon: Gearlean Alf, MD;  Location: [facility];  Service: Orthopedics;  Laterality: Right;    Current Medications: Outpatient Medications Prior to Visit  Medication Sig Dispense Refill  . acetaminophen (TYLENOL) 500 MG tablet Take 1,000 mg by mouth daily as needed for headache.    . ALPRAZolam (XANAX) 0.5 MG tablet Take 0.5-1 mg by mouth 3 (three) times daily as needed for anxiety. Reported on 05/19/2015    . aspirin EC 81 MG tablet Take 81 mg by mouth daily as needed for mild pain.    . budesonide-formoterol (SYMBICORT) 160-4.5 MCG/ACT inhaler Inhale  1 puff into the lungs 2 (two) times daily. Reported on 05/19/2015 1 Inhaler 1  . diphenhydrAMINE (BENADRYL) 25 MG tablet Take 50 mg by mouth 2 (two) times daily.    Marland Kitchen dutasteride (AVODART) 0.5 MG capsule Take 0.5 mg by mouth daily.    . fluticasone (FLONASE) 50 MCG/ACT nasal spray Place 2 sprays into the nose 2 (two) times daily.    Marland Kitchen HYDROcodone-acetaminophen (NORCO) 10-325 MG per tablet Take 1-2 tablets by mouth every 6 (six) hours as needed for moderate pain. (Patient taking differently: Take 1 tablet by mouth every 6 (six) hours as needed for moderate pain. ) 90 tablet 0  . metFORMIN (GLUCOPHAGE) 500 MG tablet Take 1,000 mg by mouth 2 (two) times daily with a meal.    . Multiple Vitamins-Minerals (CENTRUM SILVER ADULT 50+ PO) Take 1 tablet by mouth daily.    Marland Kitchen  omeprazole (PRILOSEC) 20 MG capsule     . pioglitazone (ACTOS) 45 MG tablet Take 45 mg by mouth daily.     . primidone (MYSOLINE) 50 MG tablet Take 1 tablet (50 mg total) by mouth 3 (three) times daily. (Patient taking differently: Take 50 mg by mouth 2 (two) times daily. ) 90 tablet 11  . sertraline (ZOLOFT) 100 MG tablet Take 200 mg by mouth daily.     . tamsulosin (FLOMAX) 0.4 MG CAPS capsule Take 0.4 mg by mouth daily.     Marland Kitchen tiZANidine (ZANAFLEX) 4 MG tablet Take 1 tablet (4 mg total) by mouth every 6 (six) hours as needed for muscle spasms. 40 tablet 0  . traMADol (ULTRAM) 50 MG tablet TAKE 1 TABLET BY MOUTH 4 TIMES A DAY AS NEEDED  2   No facility-administered medications prior to visit.      Allergies:   Breo ellipta [fluticasone furoate-vilanterol]; Demerol [meperidine]; Lamotrigine; and Nsaids   Social History   Social History  . Marital status: Married    Spouse name: N/A  . Number of children: N/A  . Years of education: college   Occupational History  . Retired Mathmetician [employer]   Social History Main Topics  . Smoking status: Former Smoker    Packs/day: 1.50    Years: 20.00    Types: Cigarettes    Quit date: 04/17/1990  . Smokeless tobacco: Never Used  . Alcohol use No  . Drug use: No  . Sexual activity: Not Asked   Other Topics Concern  . None   Social History Narrative   Patient works for [employer]. Patient has masters degree. Patient is married.     Family History:  The patient's family history includes Anxiety disorder in his mother; Asthma in his maternal grandmother; Heart disease in his father.   ROS:   Please see the history of present illness.    ROS All other systems reviewed and are negative.   PHYSICAL EXAM:   VS:  BP 121/74   Pulse (!) 57   Ht 6\' 2"  (1.88 m)   Wt 262 lb (118.8 kg)   BMI 33.64 kg/m    GEN: Well nourished, well developed, in no acute distress  HEENT: normal  Neck: no JVD, carotid bruits, or masses Cardiac: RRR; no murmurs,  rubs, or gallops,no edema. On exam today his PVCs are very infrequent. I do not hear a systolic click Respiratory:  clear to auscultation bilaterally, normal work of breathing GI: soft, nontender, nondistended, + BS MS: no deformity or atrophy  Skin: warm and dry, no rash Neuro:  Alert and  Oriented x 3, Strength and sensation are intact Psych: euthymic mood, full affect  Wt Readings from Last 3 Encounters:  11/12/15 262 lb (118.8 kg)  10/01/15 257 lb 12.8 oz (116.9 kg)  09/14/15 260 lb (117.9 kg)      Studies/Labs Reviewed:   EKG:  EKG is not ordered today.    Recent Labs: 06/29/2015: BUN 14; Creatinine, Ser 0.89; Hemoglobin 11.7; Platelets 270; Potassium 4.3; Sodium 138   Lipid Panel    Component Value Date/Time   CHOL 218 (H) 06/29/2015 1929   TRIG 173 (H) 06/29/2015 1929   HDL 42 06/29/2015 1929   CHOLHDL 5.2 06/29/2015 1929   VLDL 35 06/29/2015 1929   LDLCALC 141 (H) 06/29/2015 1929     ASSESSMENT:    1. PVC's (premature ventricular contractions)   2. 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 OSA on CPAP   4. Uncontrolled type 2 diabetes mellitus without complication, without long-term current use of insulin (HCC)   5. Obesity (BMI 30.0-34.9)   6. Hyperlipidemia      PLAN:  In order of problems listed above:  1. PVCs: These are asymptomatic and likely benign and did not require specific treatment. He does not have serious structural heart disease. On my review, his echo does not show true left ventricular hypertrophy. Avoid excessive doses of beta agonist bronchodilators, over-the-counter cold remedies with decongestants, stimulants such as caffeine, etc. 2. MVP: This does not currently require treatment and is unlikely to ever be a serious problem. The degree of mitral regurgitation is not significant hemodynamically.  Mild left atrial dilatation is likely related to a mild degree of prolapse of the anterior leaflet and mild-moderate posteriorly directed  eccentric mitral regurgitation. In turn, this might be the cause of his PVCs. Reevaluate by echo in the future if his symptoms evolve. Discussed the occasional, but unlikely progression to chordal rupture and severe mitral regurgitation and warning symptoms of such an event. 3. OSA: Reports compliance with CPAP 4. DM: Needs to focus on better glycemic control 5. Obesity: despite previous gastric bypass. Weight loss would help control of his diabetes. 6. HLP: Target LDL cholesterol less than 100 due to diabetes. I agree with the choice of pravastatin as a lipid-lowering agent, since he does not require a particularly potent statin and this would be a statin least likely to worsen glycemic control. Discussed importance of weight loss and regular physical exercise.   Medication Adjustments/Labs and Tests Ordered: Current medicines are reviewed at length with the patient today.  Concerns regarding medicines are outlined above.  Medication changes, Labs and Tests ordered today are listed in the Patient Instructions below. Patient Instructions  NO CHANGE WITH CURRENT MEDICATIONS   Your physician wants you to follow-up in: [place] will receive a reminder letter in the mail two months in advance. If you don't receive a letter, please call our office to schedule the follow-up appointment.   If you need a refill on your cardiac medications before your next appointment, please call your pharmacy.     Signed, Sanda Klein, MD  11/12/2015 1:35 PM    [facility] [place], [place], [place]  [address] Phone: [phone]; Fax: [phone]

## 2015-11-12 NOTE — Patient Instructions (Signed)
NO CHANGE WITH CURRENT MEDICATIONS   Your physician wants you to follow-up in: [place] will receive a reminder letter in the mail two months in advance. If you don't receive a letter, please call our office to schedule the follow-up appointment.   If you need a refill on your cardiac medications before your next appointment, please call your pharmacy.

## 2015-11-17 DIAGNOSIS — F3130 Bipolar disorder, current episode depressed, mild or moderate severity, unspecified: Secondary | ICD-10-CM | POA: Diagnosis not present

## 2015-11-30 ENCOUNTER — Encounter: Payer: Self-pay | Admitting: Adult Health

## 2015-12-01 ENCOUNTER — Ambulatory Visit (INDEPENDENT_AMBULATORY_CARE_PROVIDER_SITE_OTHER): Payer: Medicare Other | Admitting: Adult Health

## 2015-12-01 ENCOUNTER — Encounter: Payer: Self-pay | Admitting: Adult Health

## 2015-12-01 DIAGNOSIS — G4733 Obstructive sleep apnea (adult) (pediatric): Secondary | ICD-10-CM

## 2015-12-01 DIAGNOSIS — I200 Unstable angina: Secondary | ICD-10-CM

## 2015-12-01 DIAGNOSIS — Z9989 Dependence on other enabling machines and devices: Principal | ICD-10-CM

## 2015-12-01 NOTE — Patient Instructions (Signed)
Keep up the good work on C Pap at bedtime. Continued work on weight loss. May use saline rinses and saline gel prior to using your C Pap. May try Allegra daily as needed for allergy symptoms. Limit caffeine and fluids prior to bedtime. Orders for change of your home care company to [facility]. Follow-up With Dr. Elsworth Soho in 1 year and as needed

## 2015-12-01 NOTE — Progress Notes (Signed)
Subjective:    Patient ID: Ronald Pavlov., male    DOB: [birth year], 70 y.o.   MRN: [id number]  HPI 70 year old male followed for obstructive sleep apnea  TEST  Dxed 1996,Titration study 2008 with optimal pressure 14cm. S/p bariatric surgery, started at 350 , lowest 222lbs  Auto 11/2012: optimal pressure 10cm  12/01/2015 follow-up OSA Patient returns for one-year follow-up for his sleep apnea. Patient says he's doing well on C Pap. Does get up a lot at night because of urinary frequency with BPH. is on avodart and flomax. We discussed some meds changes and decreased caffeine changes prior to bedtime. Has some daytime sleepiness and takes nap. Uses CPAP with nap.  He wears his C Pap machine on average 8 hours each night. Download August 20 through 11/30/2015 shows excellent compliance with average usage at 8.75 hours. He is on AutoSet 7-12 cm of H2O. AHI ~3.6 .  Denies any chest pain, shortness of breath or edema.     Past Medical History:  Diagnosis Date  . Arthritis   . Asthma    anxiety, tree/grass pollen  . BPH (benign prostatic hyperplasia)   . Bronchitis    hx of  . Bulge of cervical disc without myelopathy   . Complication of anesthesia [id number]   no complications but just says he typically needs "more than expected" to anesthetize; needs CPAP (setting 10) in recovery  . Depression    ADD  . Diabetes mellitus    PCP [name], type 2  . Insomnia   . Recurrent upper respiratory infection (URI)    states Dr Wynelle Link aware- no fever- states is improving  . Sleep apnea    settings 10.6  / followed by [name] study years ago  . Tremor    d/t lithium   Current Outpatient Prescriptions on File Prior to Visit  Medication Sig Dispense Refill  . acetaminophen (TYLENOL) 500 MG tablet Take 1,000 mg by mouth daily as needed for headache.    . ALPRAZolam (XANAX) 0.5 MG tablet Take 0.5-1 mg by mouth 3 (three) times daily as needed for anxiety. Reported on 05/19/2015    .  aspirin EC 81 MG tablet Take 81 mg by mouth daily as needed for mild pain.    . diphenhydrAMINE (BENADRYL) 25 MG tablet Take 50 mg by mouth 2 (two) times daily.    Marland Kitchen dutasteride (AVODART) 0.5 MG capsule Take 0.5 mg by mouth daily.    . fluticasone (FLONASE) 50 MCG/ACT nasal spray Place 2 sprays into the nose 2 (two) times daily.    Marland Kitchen HYDROcodone-acetaminophen (NORCO) 10-325 MG per tablet Take 1-2 tablets by mouth every 6 (six) hours as needed for moderate pain. (Patient taking differently: Take 1 tablet by mouth every 6 (six) hours as needed for moderate pain. ) 90 tablet 0  . metFORMIN (GLUCOPHAGE) 500 MG tablet Take 1,000 mg by mouth 2 (two) times daily with a meal.    . metoprolol tartrate (LOPRESSOR) 25 MG tablet     . Multiple Vitamins-Minerals (CENTRUM SILVER ADULT 50+ PO) Take 1 tablet by mouth daily.    Marland Kitchen omeprazole (PRILOSEC) 20 MG capsule     . pioglitazone (ACTOS) 45 MG tablet Take 45 mg by mouth daily.     . pravastatin (PRAVACHOL) 40 MG tablet Take 40 mg by mouth daily.    . primidone (MYSOLINE) 50 MG tablet Take 1 tablet (50 mg total) by mouth 3 (three) times daily. (Patient taking differently: Take 50  mg by mouth 2 (two) times daily. ) 90 tablet 11  . tamsulosin (FLOMAX) 0.4 MG CAPS capsule Take 0.4 mg by mouth daily.     Marland Kitchen tiZANidine (ZANAFLEX) 4 MG tablet Take 1 tablet (4 mg total) by mouth every 6 (six) hours as needed for muscle spasms. 40 tablet 0  . traMADol (ULTRAM) 50 MG tablet TAKE 1 TABLET BY MOUTH 4 TIMES A DAY AS NEEDED  2  . budesonide-formoterol (SYMBICORT) 160-4.5 MCG/ACT inhaler Inhale 1 puff into the lungs 2 (two) times daily. Reported on 05/19/2015 (Patient not taking: Reported on 12/01/2015) 1 Inhaler 1  . sertraline (ZOLOFT) 100 MG tablet Take 200 mg by mouth daily.      No current facility-administered medications on file prior to visit.       Review of Systems Constitutional:   No  weight loss, night sweats,  Fevers, chills, fatigue, or  lassitude.  HEENT:    No headaches,  Difficulty swallowing,  Tooth/dental problems, or  Sore throat,                No sneezing, itching, ear ache, nasal congestion, post nasal drip,   CV:  No chest pain,  Orthopnea, PND, swelling in lower extremities, anasarca, dizziness, palpitations, syncope.   GI  No heartburn, indigestion, abdominal pain, nausea, vomiting, diarrhea, change in bowel habits, loss of appetite, bloody stools.   Resp: No shortness of breath with exertion or at rest.  No excess mucus, no productive cough,  No non-productive cough,  No coughing up of blood.  No change in color of mucus.  No wheezing.  No chest wall deformity  Skin: no rash or lesions.  GU: no dysuria, change in color of urine, no urgency or frequency.  No flank pain, no hematuria   MS:  No joint pain or swelling.  No decreased range of motion.  No back pain.  Psych:  No change in mood or affect. No depression or anxiety.  No memory loss.         Objective:   Physical Exam Vitals:   12/01/15 0907  BP: 102/76  Pulse: (!) 55  Temp: 98.1 F (36.7 C)  TempSrc: Oral  SpO2: 99%  Weight: 259 lb (117.5 kg)  Height: 6\' 2"  (1.88 m)  Body mass index is 33.25 kg/m.   GEN: A/Ox3; pleasant , NAD, obese    HEENT:  [place]/AT,  EACs-clear, TMs-wnl, NOSE-clear, THROAT-clear, no lesions, no postnasal drip or exudate noted. Class 2-3 MP airway   NECK:  Supple w/ fair ROM; no JVD; normal carotid impulses w/o bruits; no thyromegaly or nodules palpated; no lymphadenopathy.    RESP  Clear  P & A; w/o, wheezes/ rales/ or rhonchi. no accessory muscle use, no dullness to percussion  CARD:  RRR, no m/r/g  , no peripheral edema, pulses intact, no cyanosis or clubbing.  GI:   Soft & nt; nml bowel sounds; no organomegaly or masses detected.   Musco: Warm bil, no deformities or joint swelling noted.   Neuro: alert, no focal deficits noted.    Skin: Warm, no lesions or rashes  Tammy Parrett NP-C  [facility]    12/01/2015       Assessment & Plan:

## 2015-12-01 NOTE — Addendum Note (Signed)
Addended by: Osa Craver on: 12/01/2015 11:06 AM   Modules accepted: Orders

## 2015-12-01 NOTE — Assessment & Plan Note (Signed)
Doing well on CPAP  Order for new DME company due to insurance (change [facility] to [facility])   Plan  Patient Instructions  Keep up the good work on C Pap at bedtime. Continued work on weight loss. May use saline rinses and saline gel prior to using your C Pap. May try Allegra daily as needed for allergy symptoms. Limit caffeine and fluids prior to bedtime. Orders for change of your home care company to [facility]. Follow-up With Dr. Elsworth Soho in 1 year and as needed

## 2015-12-04 NOTE — Progress Notes (Signed)
Reviewed & agree with plan  

## 2015-12-23 ENCOUNTER — Encounter: Payer: Self-pay | Admitting: Family Medicine

## 2015-12-23 ENCOUNTER — Ambulatory Visit (INDEPENDENT_AMBULATORY_CARE_PROVIDER_SITE_OTHER): Payer: Medicare Other | Admitting: Family Medicine

## 2015-12-23 VITALS — BP 106/74 | HR 62 | Temp 97.7°F | Ht 74.0 in | Wt 263.2 lb

## 2015-12-23 DIAGNOSIS — J0101 Acute recurrent maxillary sinusitis: Secondary | ICD-10-CM | POA: Diagnosis not present

## 2015-12-23 DIAGNOSIS — I200 Unstable angina: Secondary | ICD-10-CM | POA: Diagnosis not present

## 2015-12-23 MED ORDER — AZITHROMYCIN 250 MG PO TABS: ORAL_TABLET | ORAL | 0 refills | Status: DC

## 2015-12-23 NOTE — Progress Notes (Signed)
BP 106/74   Pulse 62   Temp 97.7 F (36.5 C) (Oral)   Ht 6\' 2"  (1.88 m)   Wt 263 lb 4 oz (119.4 kg)   BMI 33.80 kg/m    Subjective:    Patient ID: Ronald Pavlov., male    DOB: May 14, 1945, 70 y.o.   MRN: [id number]  HPI: Ronald Conkey. is a 70 y.o. male presenting on 12/23/2015 for Sinusitis (sinus pain in upper left jaw, discolored mucus, congestion, sinus drainage; leaving to go to [place] today would like Zpak)   HPI Sinus congestion and green mucus drainage Patient is been having sinus congestion and green mucus drainage has been going on for the past 4 days. He says usually he continues with 90 polys and Flonase and clear most things up but this was gotten out of into his maxillary sinuses under his eyes and when it happens like that he usually needs azithromycin. He denies any fevers or chills or shortness of breath or wheezing  Relevant past medical, surgical, family and social history reviewed and updated as indicated. Interim medical history since our last visit reviewed. Allergies and medications reviewed and updated.  Review of Systems  Constitutional: Negative for chills and fever.  HENT: Positive for congestion, postnasal drip, rhinorrhea, sinus pressure and sore throat. Negative for ear discharge, ear pain, sneezing and voice change.   Eyes: Negative for pain, discharge, redness and visual disturbance.  Respiratory: Positive for cough. Negative for shortness of breath and wheezing.   Cardiovascular: Negative for chest pain and leg swelling.  Musculoskeletal: Negative for gait problem.  Skin: Negative for rash.  All other systems reviewed and are negative.   Per HPI unless specifically indicated above      Objective:    BP 106/74   Pulse 62   Temp 97.7 F (36.5 C) (Oral)   Ht 6\' 2"  (1.88 m)   Wt 263 lb 4 oz (119.4 kg)   BMI 33.80 kg/m   Wt Readings from Last 3 Encounters:  12/23/15 263 lb 4 oz (119.4 kg)  12/01/15 259 lb (117.5 kg)    11/12/15 262 lb (118.8 kg)    Physical Exam  Constitutional: He is oriented to person, place, and time. He appears well-developed and well-nourished. No distress.  HENT:  Right Ear: Tympanic membrane, external ear and ear canal normal.  Left Ear: Tympanic membrane, external ear and ear canal normal.  Nose: Mucosal edema and rhinorrhea present. No sinus tenderness. No epistaxis. Right sinus exhibits maxillary sinus tenderness. Right sinus exhibits no frontal sinus tenderness. Left sinus exhibits maxillary sinus tenderness. Left sinus exhibits no frontal sinus tenderness.  Mouth/Throat: Uvula is midline and mucous membranes are normal. Posterior oropharyngeal edema and posterior oropharyngeal erythema present. No oropharyngeal exudate or tonsillar abscesses.  Eyes: Conjunctivae and EOM are normal. Pupils are equal, round, and reactive to light. Right eye exhibits no discharge. No scleral icterus.  Neck: Neck supple. No thyromegaly present.  Cardiovascular: Normal rate, regular rhythm, normal heart sounds and intact distal pulses.   No murmur heard. Pulmonary/Chest: Effort normal and breath sounds normal. No respiratory distress. He has no wheezes. He has no rales.  Musculoskeletal: Normal range of motion. He exhibits no edema.  Lymphadenopathy:    He has no cervical adenopathy.  Neurological: He is alert and oriented to person, place, and time. Coordination normal.  Skin: Skin is warm and dry. No rash noted. He is not diaphoretic.  Psychiatric: He has a normal  mood and affect. His behavior is normal.  Nursing note and vitals reviewed.     Assessment & Plan:   Problem List Items Addressed This Visit    None    Visit Diagnoses    Acute recurrent maxillary sinusitis    -  Primary   Relevant Medications   azithromycin (ZITHROMAX) 250 MG tablet       Follow up plan: Return if symptoms worsen or fail to improve.  Counseling provided for all of the vaccine components No orders of the  defined types were placed in this encounter.   Ronald Pina, MD [facility] 12/23/2015, 9:29 AM

## 2016-01-05 DIAGNOSIS — E789 Disorder of lipoprotein metabolism, unspecified: Secondary | ICD-10-CM | POA: Diagnosis not present

## 2016-01-05 DIAGNOSIS — E118 Type 2 diabetes mellitus with unspecified complications: Secondary | ICD-10-CM | POA: Diagnosis not present

## 2016-01-05 DIAGNOSIS — N400 Enlarged prostate without lower urinary tract symptoms: Secondary | ICD-10-CM | POA: Diagnosis not present

## 2016-01-12 DIAGNOSIS — Z23 Encounter for immunization: Secondary | ICD-10-CM | POA: Diagnosis not present

## 2016-01-12 DIAGNOSIS — N400 Enlarged prostate without lower urinary tract symptoms: Secondary | ICD-10-CM | POA: Diagnosis not present

## 2016-01-12 DIAGNOSIS — E118 Type 2 diabetes mellitus with unspecified complications: Secondary | ICD-10-CM | POA: Diagnosis not present

## 2016-01-12 DIAGNOSIS — G629 Polyneuropathy, unspecified: Secondary | ICD-10-CM | POA: Diagnosis not present

## 2016-01-15 DIAGNOSIS — F3130 Bipolar disorder, current episode depressed, mild or moderate severity, unspecified: Secondary | ICD-10-CM | POA: Diagnosis not present

## 2016-01-21 DIAGNOSIS — G894 Chronic pain syndrome: Secondary | ICD-10-CM | POA: Diagnosis not present

## 2016-01-21 DIAGNOSIS — M961 Postlaminectomy syndrome, not elsewhere classified: Secondary | ICD-10-CM | POA: Diagnosis not present

## 2016-01-21 DIAGNOSIS — M1612 Unilateral primary osteoarthritis, left hip: Secondary | ICD-10-CM | POA: Diagnosis not present

## 2016-01-21 DIAGNOSIS — Z79891 Long term (current) use of opiate analgesic: Secondary | ICD-10-CM | POA: Diagnosis not present

## 2016-03-10 ENCOUNTER — Telehealth: Payer: Self-pay | Admitting: Pharmacist

## 2016-03-10 NOTE — Telephone Encounter (Signed)
Called to verify if patient is still seeing [name] for diabetes management and if we can request most recent A1c result from their office if he is still seeing [name].  No answer - LM on ans machine at home and on cell phone VM

## 2016-03-11 ENCOUNTER — Telehealth: Payer: Self-pay | Admitting: Endocrinology

## 2016-03-11 NOTE — Telephone Encounter (Signed)
Patinet is past due A1c - trying to find out if he is still seeing [name].  If he is then would like to request most recent DM office notes and if he is not then he needs appt with PCP here to follow up DM.

## 2016-03-15 NOTE — Progress Notes (Addendum)
Subjective:   [name]. was seen in consultation in the movement disorder clinic at the request of Dwan Bolt, MD.  The evaluation is for tremor.  Pt is accompanied by his wife who supplements the history.  The patient has previously seen Dr. Krista Blue and Dr. Janann Colonel.   He has seen Dr. Krista Blue since 2011.  Records that were made available to me were reviewed.  The patient has had tremor since approximately 2008.  Pt reports that it initially started just in his fingers but has progressed over time to involve the hands and legs.  It is bilateral.  His tremor has been felt to due to psychiatric medications such as lithium, Depakote (off now), and methylphenidate.  He had been tried on propranolol in the past, but ultimately developed asthma and the propranolol had to be discontinued.  He was then off of tremor medications for many years.  He started seeing Dr. Janann Colonel in June, 2015 for the only visit that he saw him and was placed on primidone.  He is currently on primidone 50 mg daily.  He does think that it helps but has read pt pamphlet and worries it will exacerbate depression.  He can write in cursive now that he has primidone.  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   08/12/14 update:  Pt is present today for f/u.  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  Is on primidone for tremor control and this was increased to 50 mg bid last visit.  Pt states that the increase did help, especially the amplitude of the tremor but he states that perhaps it hasn't changed the frequency of the tremor.  "My cursive signature is far better."  He states that he has cut down on his lithium and thinks that his mood is the same.  His psychiatrist left and he has to change care. Pt wants off of the lithium and thinks that he doesn't need it.  He continues to worry about the fact his old head injuries contribute to tremor, along with loss of memory.  His wife is  currently in [place] for 3 weeks but his wifes sister is checking on him.  He worries about that.    03/17/15 update:  The patient is following up today in regards to tremor, which has been presumed secondary to lithium.  He reports that he found a new psychiatrist and he is off of the lithium now.  He was bothered by the tremor last visit and wanted to increase his primidone (was on lithium then).  He is therefore on 100 mg in the morning and 50 mg at night.   He states that "three tablets worked perfectly."  He states that he can write cursive now and was able to resume photophography.  He has no SE and needs RF today.   I have reviewed records since last visit.  He had a left total hip replacement in August, 2016.  He states that this replacement was "easier" than the opposite side.  He is now seeing neurosx and he just had an MRI and is wondering if he will need back surgery again.    09/14/15 update:  The patient is following up today in regards to tremor, which has been presumed secondary to lithium.  Last visit, he had been off of lithium and Ritalin, and the tremor was much better.  I suggested that we start tapering the primidone.  He was very nervous about that, but I told him I felt we would be able to get off of it.  He was previously on primidone, 100 mg during the day and 50 mg at night.  The patient was willing to at least try to go down to 50 g twice a day.  He states that his tremor is still well controlled.  He states that he did try to go to one tablet and "my cursive wasn't as good."  I did review records since last visit.  The patient was hospitalized in April with chest pain and placed on metoprolol and a statin medication.  His nuclear stress test was negative.  He followed up with cardiology and he was in trigeminy at the time and his Lopressor was subsequently increased to 25 mg twice a day.  He states that when he went down on the primidone, he wasn't on the metoprolol yet.    03/16/16  update:  The patient follows up today.  Last visit, I encouraged him to try to drop his primidone back to 50 mg once per day and potentially even try going off of the medication, now that he is off of lithium.  He is also on metoprolol now and I felt that if he had any degree of tremor, the metoprolol would cover that.  He states that he didn't try to venture down to lower dose of primidone but "I think I can."  "tremor has been fairly nonexistant."  The records that were made available to me were reviewed since our last visit.  Current/Previously tried tremor medications: propranolol (developed asthma and had to d/c); on primidone  Current medications that may exacerbate tremor:  Lithium, albuterol, methylphenidate  Outside reports reviewed: historical medical records and referral letter/letters.  Allergies  Allergen Reactions  . Breo Ellipta [Fluticasone Furoate-Vilanterol] Other (See Comments)    Thrush  . Demerol [Meperidine] Itching  . Lamotrigine Hives and Itching  . Nsaids Nausea Only    Gastric bypass    Outpatient Encounter Prescriptions as of 03/16/2016  Medication Sig  . acetaminophen (TYLENOL) 500 MG tablet Take 1,000 mg by mouth daily as needed for headache.  . albuterol (PROVENTIL HFA;VENTOLIN HFA) 108 (90 Base) MCG/ACT inhaler Inhale 2 puffs into the lungs every 6 (six) hours as needed for wheezing or shortness of breath.  . ALPRAZolam (XANAX) 0.5 MG tablet Take 0.5-1 mg by mouth 3 (three) times daily as needed for anxiety. Reported on 05/19/2015  . aspirin EC 81 MG tablet Take 81 mg by mouth daily as needed for mild pain.  . diphenhydrAMINE (BENADRYL) 25 MG tablet Take 50 mg by mouth as needed.   . DULoxetine (CYMBALTA) 30 MG capsule Take 2 capsules by mouth daily.   Marland Kitchen dutasteride (AVODART) 0.5 MG capsule Take 0.5 mg by mouth daily.  . fluticasone (FLONASE) 50 MCG/ACT nasal spray Place 2 sprays into the nose 2 (two) times daily.  Marland Kitchen HYDROcodone-acetaminophen (NORCO) 10-325 MG  per tablet Take 1-2 tablets by mouth every 6 (six) hours as needed for moderate pain. (Patient taking differently: Take 1 tablet by mouth as needed for moderate pain. )  . metFORMIN (GLUCOPHAGE) 500 MG tablet Take 1,000 mg by mouth 2 (two) times daily with a meal.  . metoprolol tartrate (LOPRESSOR) 25 MG tablet Take 25 mg by mouth daily.   . Multiple Vitamins-Minerals (CENTRUM SILVER ADULT 50+ PO) Take 1 tablet by mouth daily.  Marland Kitchen omeprazole (PRILOSEC) 20 MG capsule Take 20 mg by  mouth daily.   . pioglitazone (ACTOS) 45 MG tablet Take 45 mg by mouth daily.   . pravastatin (PRAVACHOL) 40 MG tablet Take 40 mg by mouth daily.  . primidone (MYSOLINE) 50 MG tablet Take 1 tablet (50 mg total) by mouth 3 (three) times daily. (Patient taking differently: Take 50 mg by mouth 2 (two) times daily. )  . tamsulosin (FLOMAX) 0.4 MG CAPS capsule Take 0.4 mg by mouth daily.   Marland Kitchen tiZANidine (ZANAFLEX) 4 MG tablet Take 1 tablet (4 mg total) by mouth every 6 (six) hours as needed for muscle spasms.  . traMADol (ULTRAM) 50 MG tablet TAKE 1 TABLET BY MOUTH 4 TIMES A DAY AS NEEDED  . [DISCONTINUED] azithromycin (ZITHROMAX) 250 MG tablet Take 2 the first day and then one each day after.   No facility-administered encounter medications on file as of 03/16/2016.     Past Medical History:  Diagnosis Date  . Arthritis   . Asthma    anxiety, tree/grass pollen  . BPH (benign prostatic hyperplasia)   . Bronchitis    hx of  . Bulge of cervical disc without myelopathy   . Complication of anesthesia [id number]   no complications but just says he typically needs "more than expected" to anesthetize; needs CPAP (setting 10) in recovery  . Depression    ADD  . Diabetes mellitus    PCP [name], type 2  . Insomnia   . Recurrent upper respiratory infection (URI)    states Dr Wynelle Link aware- no fever- states is improving  . Sleep apnea    settings 10.6  / followed by [name] study years ago  . Tremor    d/t lithium     Past Surgical History:  Procedure Laterality Date  . CARDIAC CATHETERIZATION     2008 pre op gastric bypass  . COLONOSCOPY W/ POLYPECTOMY    . 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 JOINT REPLACEMENT     Left, Right Knee, Right hip  . KNEE ARTHROSCOPY     right x 3-4, left x 2  . KNEE ARTHROTOMY  ~1991   right  . LUMBAR FUSION  04/03/2013   [name], L5-S1  . POLYPECTOMY     vocal cords 1992  . 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   Procedure: LEFT TOTAL HIP ARTHROPLASTY ANTERIOR APPROACH;  Surgeon: [name], MD;  Location: [facility];  Service: Orthopedics;  Laterality: Left;  . TOTAL HIP REVISION  04/22/2011   Procedure: TOTAL HIP REVISION;  Surgeon: Gearlean Alf, MD;  Location: [facility];  Service: Orthopedics;  Laterality: Right;    Social History   Social History  . Marital status: Married    Spouse name: N/A  . Number of children: N/A  . Years of education: college   Occupational History  . Retired Mathmetician [employer]   Social History Main Topics  . Smoking status: Former Smoker    Packs/day: 1.50    Years: 20.00    Types: Cigarettes    Quit date: 04/17/1990  . Smokeless tobacco: Never Used  . Alcohol use No  . Drug use: No  . Sexual activity: Not on file   Other Topics Concern  . Not on file   Social History Narrative   Patient works for [employer]. Patient has masters degree. Patient is married.    Family Status  Relation Status  . Father Deceased   heart disease  . Mother Deceased   chronic depression  .  Maternal Grandmother     Review of Systems   A complete 10 system ROS was obtained and was negative apart from what is mentioned.   Objective:   VITALS:   Vitals:   03/16/16 1009  BP: 130/80  Pulse: 60  Weight: 275 lb (124.7 kg)  Height: 6\' 2"  (1.88 m)   Gen:  Appears stated age and in NAD. HEENT:  Normocephalic, atraumatic. The mucous membranes are moist. The superficial temporal arteries are without ropiness or tenderness. Cardiovascular:  Regular rate and rhythm. Lungs: Clear to auscultation bilaterally. Neck: There are no carotid bruits noted bilaterally.  NEUROLOGICAL:  Orientation:  The patient is alert and oriented x 3.  He is verbose and has trouble focusing and concentrating on the topic at hand. Cranial nerves: There is good facial symmetry. Extraocular muscles are intact and visual fields are full to confrontational testing. Speech is fluent and clear. Soft palate rises symmetrically and there is no tongue deviation. Hearing is intact to conversational tone. Tone: Tone is good throughout. Sensation: Sensation is intact to light touch throughout. Coordination:  The patient no dysmetria.  No trouble with hand opening/closing, finger taps, toe taps, heel taps bilaterally.  Minimal trouble with alternation supination/pronation of forearm on the left only. Motor: Strength is 5/5 in the bilateral upper and lower extremities.  Shoulder shrug is equal bilaterally.  There is no pronator drift.  There are no fasciculations noted.   MOVEMENT EXAM: Tremor:  There is no tremor in the UE.  He spills minimal water when asked to pour water from one glass to another.       Lab Results  Component Value Date   HGBA1C 8.0 (H) 06/29/2015   Lab Results  Component Value Date   NA 138 06/29/2015   BUN 14 06/29/2015   CREATININE 0.89 06/29/2015   WBC 6.8 06/29/2015    Assessment/Plan:   1.   Tremor.  -He is now off of lithium and methylphenidate.  Told him again he could likely get off of primidone.   He is down to primidone 50 mg bid.  He is on metoprolol for trigeminy.   This may help tremor as well so told him to back down again and potentially d/c the medication.  He will do this and let me know.   Gave him a titration schedule  2.  Diabetic PN  - Talked about importance of diabetic control as he is not watching diet.  Talked about diet/exercise.   3. Follow up as needed (if goes back on the primidone)

## 2016-03-16 ENCOUNTER — Encounter: Payer: Self-pay | Admitting: Neurology

## 2016-03-16 ENCOUNTER — Ambulatory Visit (INDEPENDENT_AMBULATORY_CARE_PROVIDER_SITE_OTHER): Payer: Medicare Other | Admitting: Neurology

## 2016-03-16 VITALS — BP 130/80 | HR 60 | Ht 74.0 in | Wt 275.0 lb

## 2016-03-16 DIAGNOSIS — G251 Drug-induced tremor: Secondary | ICD-10-CM | POA: Diagnosis not present

## 2016-03-16 NOTE — Patient Instructions (Addendum)
1.  Try to decrease primidone to 50 mg daily for 3 weeks and then you can stop the medication.  Your metoprolol can help to control tremor 2.  Follow up with me as needed (if you end up going back on the medication make a follow up for 6 months from now)

## 2016-04-26 DIAGNOSIS — Z79891 Long term (current) use of opiate analgesic: Secondary | ICD-10-CM | POA: Diagnosis not present

## 2016-04-26 DIAGNOSIS — G894 Chronic pain syndrome: Secondary | ICD-10-CM | POA: Diagnosis not present

## 2016-04-26 DIAGNOSIS — M961 Postlaminectomy syndrome, not elsewhere classified: Secondary | ICD-10-CM | POA: Diagnosis not present

## 2016-05-27 ENCOUNTER — Other Ambulatory Visit: Payer: Self-pay

## 2016-05-27 MED ORDER — METOPROLOL TARTRATE 25 MG PO TABS: 25.0000 mg | ORAL_TABLET | Freq: Two times a day (BID) | ORAL | 3 refills | Status: DC

## 2016-06-07 DIAGNOSIS — E789 Disorder of lipoprotein metabolism, unspecified: Secondary | ICD-10-CM | POA: Diagnosis not present

## 2016-06-07 DIAGNOSIS — N400 Enlarged prostate without lower urinary tract symptoms: Secondary | ICD-10-CM | POA: Diagnosis not present

## 2016-06-07 DIAGNOSIS — N390 Urinary tract infection, site not specified: Secondary | ICD-10-CM | POA: Diagnosis not present

## 2016-06-07 DIAGNOSIS — E118 Type 2 diabetes mellitus with unspecified complications: Secondary | ICD-10-CM | POA: Diagnosis not present

## 2016-06-07 DIAGNOSIS — K219 Gastro-esophageal reflux disease without esophagitis: Secondary | ICD-10-CM | POA: Diagnosis not present

## 2016-06-13 DIAGNOSIS — F3130 Bipolar disorder, current episode depressed, mild or moderate severity, unspecified: Secondary | ICD-10-CM | POA: Diagnosis not present

## 2016-06-14 DIAGNOSIS — E118 Type 2 diabetes mellitus with unspecified complications: Secondary | ICD-10-CM | POA: Diagnosis not present

## 2016-06-14 DIAGNOSIS — R0781 Pleurodynia: Secondary | ICD-10-CM | POA: Diagnosis not present

## 2016-06-14 DIAGNOSIS — N400 Enlarged prostate without lower urinary tract symptoms: Secondary | ICD-10-CM | POA: Diagnosis not present

## 2016-06-14 DIAGNOSIS — S299XXA Unspecified injury of thorax, initial encounter: Secondary | ICD-10-CM | POA: Diagnosis not present

## 2016-06-14 DIAGNOSIS — D509 Iron deficiency anemia, unspecified: Secondary | ICD-10-CM | POA: Diagnosis not present

## 2016-07-01 ENCOUNTER — Telehealth: Payer: Self-pay | Admitting: Cardiovascular Disease

## 2016-07-01 NOTE — Telephone Encounter (Signed)
New message      Pt c/o Syncope: STAT if syncope occurred within 30 minutes and pt complains of lightheadedness High Priority if episode of passing out, completely, today or in last 24 hours   1. Did you pass out today? no  2. When is the last time you passed out? Last night before going to bed  3. Has this occurred multiple times?  no  4. Did you have any symptoms prior to passing out? Prior to passing out, pt c/o dizziness, legs unstable then pt fainted

## 2016-07-01 NOTE — Telephone Encounter (Signed)
Returned call to patient Patient states he had 4 dizzy spells when he got up from a chair, for example On the last instance, he tried to walk and then he blacked out He states he only took 2-3 steps and then passed out  He states his head hit the wall and then he came to very quickly -- sounds like orthostasis  This is the first episode he has experienced Patient states his HR runs in the 50s-60s (occasionally high 40s) and BP usually 110s-120s/60s -- patient states he feels bi/trigeminy - palpitations (h/o PVCs)  Patient does not have upcoming appt scheduled - due for MD OV 10/2016  First APP appointment 07/11/16  No MD appointments available until July 2018  Will route to MD for advice

## 2016-07-01 NOTE — Telephone Encounter (Signed)
Have there been recent changes in his meds? Cymbalta can cause orthostatic hypotension and is new since I last saw him. Tamsulosin can also do it, but he's been on it for a longer time. If his blood glucose is very high (over 180-200) it can make him quickly dehydrated due to glucosuria. Have him drink plenty of fluids, increase salt intake over the weekend. Make him first available APP appt, but have him call sooner if still having problems.

## 2016-07-01 NOTE — Telephone Encounter (Signed)
Patient informed of MD advice. He has had no recent med changes. He does not report issues w/elevated blood sugar. Scheduled for 1st available APP appointment May 1 with Gerald Stabs, NP. Informed patient he can seek assistance after hours/on weekends for acute concerns via on-call provider. He will call back if symptoms persist/worsen.

## 2016-07-04 DIAGNOSIS — M47816 Spondylosis without myelopathy or radiculopathy, lumbar region: Secondary | ICD-10-CM | POA: Diagnosis not present

## 2016-07-08 DIAGNOSIS — E118 Type 2 diabetes mellitus with unspecified complications: Secondary | ICD-10-CM | POA: Diagnosis not present

## 2016-07-08 DIAGNOSIS — E789 Disorder of lipoprotein metabolism, unspecified: Secondary | ICD-10-CM | POA: Diagnosis not present

## 2016-07-12 ENCOUNTER — Encounter: Payer: Self-pay | Admitting: Nurse Practitioner

## 2016-07-12 ENCOUNTER — Ambulatory Visit (INDEPENDENT_AMBULATORY_CARE_PROVIDER_SITE_OTHER): Payer: Medicare Other | Admitting: Nurse Practitioner

## 2016-07-12 VITALS — BP 108/62 | HR 59 | Ht 74.0 in | Wt 258.0 lb

## 2016-07-12 DIAGNOSIS — I341 Nonrheumatic mitral (valve) prolapse: Secondary | ICD-10-CM | POA: Diagnosis not present

## 2016-07-12 DIAGNOSIS — I493 Ventricular premature depolarization: Secondary | ICD-10-CM

## 2016-07-12 DIAGNOSIS — I951 Orthostatic hypotension: Secondary | ICD-10-CM

## 2016-07-12 MED ORDER — METOPROLOL TARTRATE 25 MG PO TABS: 12.5000 mg | ORAL_TABLET | Freq: Two times a day (BID) | ORAL | 3 refills | Status: DC

## 2016-07-12 NOTE — Progress Notes (Signed)
Office Visit    Patient Name: [name]. Date of Encounter: 07/12/2016  Primary Care Provider:  Dwan Bolt, MD Primary Cardiologist:  Jerilynn Mages. Croitoru, MD   Chief Complaint    71 year old male with a history of PVCs, mitral valve prolapse, obstructive sleep apnea, hyperlipidemia, and diabetes who presents for follow-up related to orthostatic hypotension and syncope.  Past Medical History    Past Medical History:  Diagnosis Date  . Arthritis   . Asthma    anxiety, tree/grass pollen  . BPH (benign prostatic hyperplasia)   . Bronchitis    hx of  . Bulge of cervical disc without myelopathy   . Chest pain    a. 06/2015 MV: EF 56%, no ischemia/infarct.  . Complication of anesthesia 04/24/92   no complications but just says he typically needs "more than expected" to anesthetize; needs CPAP (setting 10) in recovery  . Depression    ADD  . Diabetes mellitus    PCP [name], type 2  . Insomnia   . Mitral valve prolapse    a. 06/2015 Echo: EF 65-70%, mild LVH, no rwma, mild MVP of ant leaflet and mild to mod posteriorly directed MR, mildly dil La.  . Obesity   . PVC's (premature ventricular contractions)   . Recurrent upper respiratory infection (URI)    states Dr Wynelle Link aware- no fever- states is improving  . Sleep apnea    settings 10.6  / followed by [name] study years ago  . Tremor    d/t lithium   Past Surgical History:  Procedure Laterality Date  . CARDIAC CATHETERIZATION     2008 pre op gastric bypass  . COLONOSCOPY W/ POLYPECTOMY    . 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 JOINT REPLACEMENT     Left, Right Knee, Right hip  . KNEE ARTHROSCOPY     right x 3-4, left x 2  . KNEE ARTHROTOMY  ~1991   right  . LUMBAR FUSION  04/03/2013   [name], L5-S1  . POLYPECTOMY     vocal cords 1992  . 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   Procedure: LEFT TOTAL HIP ARTHROPLASTY ANTERIOR APPROACH;  Surgeon: [name], MD;  Location: [facility];  Service:  Orthopedics;  Laterality: Left;  . TOTAL HIP REVISION  04/22/2011   Procedure: TOTAL HIP REVISION;  Surgeon: Gearlean Alf, MD;  Location: [facility];  Service: Orthopedics;  Laterality: Right;    Allergies  Allergies  Allergen Reactions  . Breo Ellipta [Fluticasone Furoate-Vilanterol] Other (See Comments)    Thrush  . Demerol [Meperidine] Itching  . Lamotrigine Hives and Itching  . Nsaids Nausea Only    Gastric bypass    History of Present Illness    72 year old male with the above past medical history including diabetes, hyperlipidemia, obesity, and sleep apnea. In April 2017, he was admitted with chest pain, ruled out, and underwent stress testing revealing normal perfusion. An echocardiogram showed normal LV function with mitral valve prolapse as outlined above. He was also noted to have frequent PVCs and has been maintained on beta blocker therapy. Overall, he has tolerated this well. He has been having some seasonal allergies and was treating himself with diphenhydramine 25 mg up to 2-3 times per day recently. He is also been using guaifenesin for increased secretions related to allergies. In that setting, on the evening of April 19, he was sitting in his chair on 3 separate occasions, upon standing,  he felt lightheaded. This resolved relatively quickly. I fourth occasion, he stood and again developed lightheadedness which was more profound. He was walking when this occurred and he subsequently lost consciousness, fell forward into a wall, and then onto the floor. He did not suffer any injuries. This was witnessed by his wife. He was apparently without consciousness for only a few seconds. When he regained consciousness, he did feel weak. He did not have any chest pain, dyspnea, or palpitations.  Over the subsequent 2 days, he have his metoprolol dose to 12.5 mg twice a day and had no recurrent symptoms. Since then however, he has been taking his metoprolol as prescribed at 25 mg twice a day and  he has been having some lightheadedness with standing. He has not had any recurrent syncope. He denies chest pain, dyspnea, PND, orthopnea, edema, or early satiety.  Home Medications    Prior to Admission medications   Medication Sig Start Date End Date Taking? Authorizing Provider  acetaminophen (TYLENOL) 500 MG tablet Take 1,000 mg by mouth daily as needed for headache.   Yes Historical Provider, MD  albuterol (PROVENTIL HFA;VENTOLIN HFA) 108 (90 Base) MCG/ACT inhaler Inhale 2 puffs into the lungs every 6 (six) hours as needed for wheezing or shortness of breath.   Yes Historical Provider, MD  ALPRAZolam Duanne Moron) 0.5 MG tablet Take 0.5-1 mg by mouth 3 (three) times daily as needed for anxiety. Reported on 05/19/2015   Yes Historical Provider, MD  aspirin EC 81 MG tablet Take 81 mg by mouth daily as needed for mild pain.   Yes Historical Provider, MD  diphenhydrAMINE (BENADRYL) 25 MG tablet Take 50 mg by mouth as needed.    Yes Historical Provider, MD  DULoxetine (CYMBALTA) 60 MG capsule Take 2 capsules by mouth daily. 06/22/16  Yes Historical Provider, MD  dutasteride (AVODART) 0.5 MG capsule Take 0.5 mg by mouth daily.   Yes Historical Provider, MD  fluticasone (FLONASE) 50 MCG/ACT nasal spray Place 2 sprays into the nose 2 (two) times daily. 04/15/14  Yes Historical Provider, MD  HYDROcodone-acetaminophen (NORCO) 10-325 MG per tablet Take 1-2 tablets by mouth every 6 (six) hours as needed for moderate pain. Patient taking differently: Take 1 tablet by mouth as needed for moderate pain.  04/05/13  Yes Eustace Moore, MD  metFORMIN (GLUCOPHAGE) 500 MG tablet Take 1,000 mg by mouth 2 (two) times daily with a meal.   Yes Historical Provider, MD  metoprolol tartrate (LOPRESSOR) 25 MG tablet Take 0.5 tablets (12.5 mg total) by mouth 2 (two) times daily. 07/12/16  Yes Rogelia Mire, NP  Multiple Vitamins-Minerals (CENTRUM SILVER ADULT 50+ PO) Take 1 tablet by mouth daily.   Yes Historical Provider, MD    omeprazole (PRILOSEC) 20 MG capsule Take 20 mg by mouth daily.  09/23/15  Yes Historical Provider, MD  pioglitazone (ACTOS) 45 MG tablet Take 45 mg by mouth daily.  07/15/14  Yes Historical Provider, MD  pravastatin (PRAVACHOL) 40 MG tablet Take 40 mg by mouth daily.   Yes Historical Provider, MD  tamsulosin (FLOMAX) 0.4 MG CAPS capsule Take 0.4 mg by mouth daily.    Yes Historical Provider, MD  tiZANidine (ZANAFLEX) 4 MG tablet Take 1 tablet (4 mg total) by mouth every 6 (six) hours as needed for muscle spasms. 10/21/14  Yes Danae Orleans, PA-C  traMADol (ULTRAM) 50 MG tablet TAKE 1 TABLET BY MOUTH 4 TIMES A DAY AS NEEDED 09/21/15  Yes Historical Provider, MD  Review of Systems    Orthostatic presyncope and syncope as outlined above. He denies chest pain, dyspnea, palpitations, PND, orthopnea, edema, or early satiety..  All other systems reviewed and are otherwise negative except as noted above.  Physical Exam    VS:  BP 108/62 (BP Location: Right Arm, Patient Position: Sitting, Cuff Size: Large)   Pulse (!) 59   Ht 6\' 2"  (1.88 m)   Wt 258 lb (117 kg)   BMI 33.13 kg/m  , BMI Body mass index is 33.13 kg/m.  Orthostatic VS for the past 24 hrs:  BP- Lying Pulse- Lying BP- Sitting Pulse- Sitting BP- Standing at 0 minutes Pulse- Standing at 0 minutes  07/12/16 1211 112/76 53 91/67 60 96/63 65  Blood pressure standing at 2 minutes was 100/72 with a heart rate of 65. GEN: Well nourished, well developed, in no acute distress.  HEENT: normal.  Neck: Supple, no JVD, carotid bruits, or masses. Cardiac: RRR, no murmurs, rubs, or gallops. No clubbing, cyanosis, edema.  Radials/DP/PT 2+ and equal bilaterally.  Respiratory:  Respirations regular and unlabored, clear to auscultation bilaterally. GI: Soft, nontender, nondistended, BS + x 4. MS: no deformity or atrophy. Skin: warm and dry, no rash. Neuro:  Strength and sensation are intact. Psych: Normal affect.  Accessory Clinical Findings     ECG - Sinus bradycardia, 59, PVC, right bundle branch block, probable left atrial enlargement, no acute changes.  Assessment & Plan    1.  Symptomatic orthostatic hypotension/syncope: Patient recently had 3 presyncopal spells followed by a syncopal spell, all occurring while moving from a seated to standing position. His blood pressures have been low at home and on that particular day, here are retaken 2-3 Benadryl as well for seasonal allergies. He thinks he also took some guaifenesin and has been on Cymbalta and Flomax on top of baseline metoprolol therapy as well. He is orthostatic in clinic today. I recommended that he reduce his metoprolol back to 12.5 mg twice a day-as he did this for 2 days following the syncopal spell and felt well. I've also recommended he avoid taking Benadryl during the day. He says he is already switched to an over-the-counter Allegra formulation. I encouraged him to hydrate well at home and change position slowly. He did recently have blood work with primary care and therefore I will not be repeating this.  2. PVCs: He has not been having any symptomatically palpitations. I advised him to be on the look out for more frequent PVCs or palpitations in the setting of reduced beta blocker therapy.  3. Mitral valve prolapse: He has not been having any dyspnea or increase in palpitations. Continue low-dose beta blocker as outlined above.  4. Type 2 diabetes mellitus: He is on metformin and Actos therapy and this is managed by primary care.  5. Disposition: Follow-up with Dr. Sallyanne Kuster in 3 months or sooner if necessary.  Murray Hodgkins, NP 07/12/2016, 12:53 PM

## 2016-07-12 NOTE — Patient Instructions (Signed)
Medication Instructions: Decrease Metoprolol to 12.5 mg (1/2 tab) twice daily.   Follow-Up: Your physician recommends that you schedule a follow-up appointment in: 3 months with Dr. Sallyanne Kuster.  If you need a refill on your cardiac medications before your next appointment, please call your pharmacy.

## 2016-07-13 NOTE — Progress Notes (Signed)
Thanks

## 2016-07-14 DIAGNOSIS — N3281 Overactive bladder: Secondary | ICD-10-CM | POA: Diagnosis not present

## 2016-07-14 DIAGNOSIS — E118 Type 2 diabetes mellitus with unspecified complications: Secondary | ICD-10-CM | POA: Diagnosis not present

## 2016-07-14 DIAGNOSIS — R55 Syncope and collapse: Secondary | ICD-10-CM | POA: Diagnosis not present

## 2016-07-25 DIAGNOSIS — M47816 Spondylosis without myelopathy or radiculopathy, lumbar region: Secondary | ICD-10-CM | POA: Diagnosis not present

## 2016-08-04 DIAGNOSIS — Z79891 Long term (current) use of opiate analgesic: Secondary | ICD-10-CM | POA: Diagnosis not present

## 2016-08-04 DIAGNOSIS — M961 Postlaminectomy syndrome, not elsewhere classified: Secondary | ICD-10-CM | POA: Diagnosis not present

## 2016-08-04 DIAGNOSIS — G894 Chronic pain syndrome: Secondary | ICD-10-CM | POA: Diagnosis not present

## 2016-08-04 DIAGNOSIS — M1612 Unilateral primary osteoarthritis, left hip: Secondary | ICD-10-CM | POA: Diagnosis not present

## 2016-08-09 DIAGNOSIS — E118 Type 2 diabetes mellitus with unspecified complications: Secondary | ICD-10-CM | POA: Diagnosis not present

## 2016-08-09 DIAGNOSIS — E789 Disorder of lipoprotein metabolism, unspecified: Secondary | ICD-10-CM | POA: Diagnosis not present

## 2016-08-16 DIAGNOSIS — E789 Disorder of lipoprotein metabolism, unspecified: Secondary | ICD-10-CM | POA: Diagnosis not present

## 2016-08-16 DIAGNOSIS — E118 Type 2 diabetes mellitus with unspecified complications: Secondary | ICD-10-CM | POA: Diagnosis not present

## 2016-08-16 DIAGNOSIS — R079 Chest pain, unspecified: Secondary | ICD-10-CM | POA: Diagnosis not present

## 2016-09-06 DIAGNOSIS — M549 Dorsalgia, unspecified: Secondary | ICD-10-CM | POA: Diagnosis not present

## 2016-09-06 DIAGNOSIS — E789 Disorder of lipoprotein metabolism, unspecified: Secondary | ICD-10-CM | POA: Diagnosis not present

## 2016-09-06 DIAGNOSIS — E118 Type 2 diabetes mellitus with unspecified complications: Secondary | ICD-10-CM | POA: Diagnosis not present

## 2016-09-06 DIAGNOSIS — D649 Anemia, unspecified: Secondary | ICD-10-CM | POA: Diagnosis not present

## 2016-09-08 ENCOUNTER — Other Ambulatory Visit: Payer: Self-pay | Admitting: Cardiovascular Disease

## 2016-09-09 NOTE — Telephone Encounter (Signed)
Rx(s) sent to pharmacy electronically.  

## 2016-10-11 DIAGNOSIS — F3130 Bipolar disorder, current episode depressed, mild or moderate severity, unspecified: Secondary | ICD-10-CM | POA: Diagnosis not present

## 2016-10-14 ENCOUNTER — Ambulatory Visit (INDEPENDENT_AMBULATORY_CARE_PROVIDER_SITE_OTHER): Payer: Medicare Other | Admitting: Cardiovascular Disease

## 2016-10-14 ENCOUNTER — Encounter: Payer: Self-pay | Admitting: Cardiovascular Disease

## 2016-10-14 VITALS — BP 103/68 | HR 49 | Ht 74.0 in | Wt 252.0 lb

## 2016-10-14 DIAGNOSIS — I493 Ventricular premature depolarization: Secondary | ICD-10-CM | POA: Diagnosis not present

## 2016-10-14 DIAGNOSIS — E782 Mixed hyperlipidemia: Secondary | ICD-10-CM

## 2016-10-14 DIAGNOSIS — E66811 Obesity, class 1: Secondary | ICD-10-CM

## 2016-10-14 DIAGNOSIS — E1169 Type 2 diabetes mellitus with other specified complication: Secondary | ICD-10-CM

## 2016-10-14 DIAGNOSIS — G4733 Obstructive sleep apnea (adult) (pediatric): Secondary | ICD-10-CM | POA: Diagnosis not present

## 2016-10-14 DIAGNOSIS — I951 Orthostatic hypotension: Secondary | ICD-10-CM

## 2016-10-14 DIAGNOSIS — I340 Nonrheumatic mitral (valve) insufficiency: Secondary | ICD-10-CM | POA: Diagnosis not present

## 2016-10-14 DIAGNOSIS — Z9989 Dependence on other enabling machines and devices: Secondary | ICD-10-CM

## 2016-10-14 DIAGNOSIS — E669 Obesity, unspecified: Secondary | ICD-10-CM

## 2016-10-14 MED ORDER — METOPROLOL TARTRATE 25 MG PO TABS: 12.5000 mg | ORAL_TABLET | Freq: Two times a day (BID) | ORAL | 3 refills | Status: DC

## 2016-10-14 MED ORDER — ALFUZOSIN HCL ER 10 MG PO TB24: 10.0000 mg | ORAL_TABLET | Freq: Every day | ORAL | 3 refills | Status: DC

## 2016-10-14 NOTE — Progress Notes (Signed)
.    Cardiology Office Note    Date:  10/14/2016   ID:  Ronald Pavlov., DOB 1945-10-07, MRN [id number]  PCP:  Anda Kraft, MD  Cardiologist:   Sanda Klein, MD   Chief Complaint  Patient presents with  . Follow-up    pt c/o passing out 2x in the pass 4 weeks, pt stated that he wakes up when he hit the floor    History of Present Illness:  [name]. is a 70 y.o. male  turns to discuss treatment for hyperlipidemia and mild mitral valve prolapse of the anterior leaflet and mild to moderate posteriorly directed mitral insufficiency.   Recently he has had worsening problems with orthostatic hypotension. He often gets dizzy after getting out of bed and walking for 3 or 4 steps. 2 episodes have been very severe and at [place] 2 brief syncope. He woke up immediately after falling to the floor. Thankfully he did not have any serious injury. On presentation today his resting heart rate was as low as 49 bpm while we were acquiring his ECG. His blood pressure is low as well. He has an old right bundle branch block. We checked orthostatic vital signs and there is substantial drop in blood pressure.  Laying down  97/64 mm Hg  51 bpm Sitting   87/58   54  Standing  79/49   59 Standing 3 minutes 95/64   67  He is taking tamsulosin for BPH and has substantial difficulty with urination if he doesn't take it. He is taking a very low dose of metoprolol for palpitations, which are probably PVCs related to his mitral valve prolapse. He is now taking [place] for his diabetes mellitus and this has had great impact on his glucose control. It may be causing some degree of dehydration. After being on Zoloft for many years this medication no longer worked for him and he was switched to Cymbalta. This has had an excellent effect on his mood and is also helping with his chronic pain problems.  The patient specifically denies any chest pain at rest exertion, dyspnea at rest or with exertion, orthopnea,  paroxysmal nocturnal dyspnea, focal neurological deficits, intermittent claudication, lower extremity edema, unexplained weight gain, cough, hemoptysis or wheezing.  He reports compliance with CPAP for obstructive sleep apnea. He has 2 diabetes mellitus and hyperlipidemia but does not have congestive heart failure or coronary disease. He had a normal nuclear stress test in April 2017.  Past Medical History:  Diagnosis Date  . Arthritis   . Asthma    anxiety, tree/grass pollen  . BPH (benign prostatic hyperplasia)   . Bronchitis    hx of  . Bulge of cervical disc without myelopathy   . Chest pain    a. 06/2015 MV: EF 56%, no ischemia/infarct.  . Complication of anesthesia 06/27/36   no complications but just says he typically needs "more than expected" to anesthetize; needs CPAP (setting 10) in recovery  . Depression    ADD  . Diabetes mellitus    PCP [name], type 2  . Insomnia   . Mitral valve prolapse    a. 06/2015 Echo: EF 65-70%, mild LVH, no rwma, mild MVP of ant leaflet and mild to mod posteriorly directed MR, mildly dil La.  . Obesity   . PVC's (premature ventricular contractions)   . Recurrent upper respiratory infection (URI)    states Dr Wynelle Link aware- no fever- states is improving  . Sleep apnea  settings 10.6  / followed by [name] study years ago  . Tremor    d/t lithium    Past Surgical History:  Procedure Laterality Date  . CARDIAC CATHETERIZATION     2008 pre op gastric bypass  . COLONOSCOPY W/ POLYPECTOMY    . 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 JOINT REPLACEMENT     Left, Right Knee, Right hip  . KNEE ARTHROSCOPY     right x 3-4, left x 2  . KNEE ARTHROTOMY  ~1991   right  . LUMBAR FUSION  04/03/2013   [name], L5-S1  . POLYPECTOMY     vocal cords 1992  . 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   Procedure: LEFT TOTAL HIP ARTHROPLASTY ANTERIOR APPROACH;  Surgeon: [name], MD;  Location: [facility];  Service: Orthopedics;  Laterality: Left;    . TOTAL HIP REVISION  04/22/2011   Procedure: TOTAL HIP REVISION;  Surgeon: Gearlean Alf, MD;  Location: [facility];  Service: Orthopedics;  Laterality: Right;    Current Medications: Outpatient Medications Prior to Visit  Medication Sig Dispense Refill  . acetaminophen (TYLENOL) 500 MG tablet Take 1,000 mg by mouth daily as needed for headache.    . albuterol (PROVENTIL HFA;VENTOLIN HFA) 108 (90 Base) MCG/ACT inhaler Inhale 2 puffs into the lungs every 6 (six) hours as needed for wheezing or shortness of breath.    . ALPRAZolam (XANAX) 0.5 MG tablet Take 0.5-1 mg by mouth 3 (three) times daily as needed for anxiety. Reported on 05/19/2015    . aspirin EC 81 MG tablet Take 81 mg by mouth daily as needed for mild pain.    . DULoxetine (CYMBALTA) 60 MG capsule Take 2 capsules by mouth daily.  5  . dutasteride (AVODART) 0.5 MG capsule Take 0.5 mg by mouth daily.    . fluticasone (FLONASE) 50 MCG/ACT nasal spray Place 2 sprays into the nose 2 (two) times daily.    Marland Kitchen HYDROcodone-acetaminophen (NORCO) 10-325 MG per tablet Take 1-2 tablets by mouth every 6 (six) hours as needed for moderate pain. (Patient taking differently: Take 1 tablet by mouth as needed for moderate pain. ) 90 tablet 0  . metFORMIN (GLUCOPHAGE) 500 MG tablet Take 1,000 mg by mouth 2 (two) times daily with a meal.    . metoprolol tartrate (LOPRESSOR) 25 MG tablet TAKE 1 TABLET (25 MG TOTAL) BY MOUTH 2 (TWO) TIMES DAILY. 60 tablet 11  . Multiple Vitamins-Minerals (CENTRUM SILVER ADULT 50+ PO) Take 1 tablet by mouth daily.    . pioglitazone (ACTOS) 45 MG tablet Take 45 mg by mouth daily.     . pravastatin (PRAVACHOL) 40 MG tablet Take 40 mg by mouth daily.    . tamsulosin (FLOMAX) 0.4 MG CAPS capsule Take 0.4 mg by mouth daily.     Marland Kitchen tiZANidine (ZANAFLEX) 4 MG tablet Take 1 tablet (4 mg total) by mouth every 6 (six) hours as needed for muscle spasms. 40 tablet 0  . traMADol (ULTRAM) 50 MG tablet TAKE 1 TABLET BY MOUTH 4 TIMES A DAY AS  NEEDED  2  . diphenhydrAMINE (BENADRYL) 25 MG tablet Take 50 mg by mouth as needed.     . metoprolol tartrate (LOPRESSOR) 25 MG tablet Take 0.5 tablets (12.5 mg total) by mouth 2 (two) times daily. 60 tablet 3  . omeprazole (PRILOSEC) 20 MG capsule Take 20 mg by mouth daily.      No facility-administered medications prior to visit.  Allergies:   Breo ellipta [fluticasone furoate-vilanterol]; Demerol [meperidine]; Lamotrigine; and Nsaids   Social History   Social History  . Marital status: Married    Spouse name: N/A  . Number of children: N/A  . Years of education: college   Occupational History  . Retired Mathmetician [employer]   Social History Main Topics  . Smoking status: Former Smoker    Packs/day: 1.50    Years: 20.00    Types: Cigarettes    Quit date: 04/17/1990  . Smokeless tobacco: Never Used  . Alcohol use No  . Drug use: No  . Sexual activity: Not on file   Other Topics Concern  . Not on file   Social History Narrative   Patient works for [employer]. Patient has masters degree. Patient is married.     Family History:  The patient's family history includes Anxiety disorder in his mother; Asthma in his maternal grandmother; Heart disease in his father.   ROS:   Please see the history of present illness.    ROS All other systems reviewed and are negative.   PHYSICAL EXAM:   VS:  BP 103/68   Pulse (!) 49   Ht 6\' 2"  (1.88 m)   Wt 252 lb (114.3 kg)   BMI 32.35 kg/m    GEN: Well nourished, well developed, in no acute distress   General: Alert, oriented x3, no distress Head: no evidence of trauma, PERRL, EOMI, no exophtalmos or lid lag, no myxedema, no xanthelasma; normal ears, nose and oropharynx Neck: normal jugular venous pulsations and no hepatojugular reflux; brisk carotid pulses without delay and no carotid bruits Chest: clear to auscultation, no signs of consolidation by percussion or palpation, normal fremitus, symmetrical and full respiratory  excursions Cardiovascular: normal position and quality of the apical impulse, regular rhythm, normal first and second heart sounds, no diastolic murmurs, rubs or gallops. There is no systolic click even with the Valsalva maneuver. There is a grade 1/6 late systolic murmur heard at the apex. Abdomen: no tenderness or distention, no masses by palpation, no abnormal pulsatility or arterial bruits, normal bowel sounds, no hepatosplenomegaly Extremities: no clubbing, cyanosis or edema; 2+ radial, ulnar and brachial pulses bilaterally; 2+ right femoral, posterior tibial and dorsalis pedis pulses; 2+ left femoral, posterior tibial and dorsalis pedis pulses; no subclavian or femoral bruits Neurological: grossly nonfocal' Psych: euthymic mood, full affect  Wt Readings from Last 3 Encounters:  10/14/16 252 lb (114.3 kg)  07/12/16 258 lb (117 kg)  03/16/16 275 lb (124.7 kg)      Studies/Labs Reviewed:   EKG:  EKG is not ordered today.    Recent Labs: No results found for requested labs within last 8760 hours.   Lipid Panel    Component Value Date/Time   CHOL 218 (H) 06/29/2015 1929   TRIG 173 (H) 06/29/2015 1929   HDL 42 06/29/2015 1929   CHOLHDL 5.2 06/29/2015 1929   VLDL 35 06/29/2015 1929   LDLCALC 141 (H) 06/29/2015 1929     ASSESSMENT:    No diagnosis found.   PLAN:  In order of problems listed above:  1. Orthostatic hypotension: This is moderately severe and has caused syncope on 2 separate occasions. It is likely that he is relatively hypovolemic after starting the SGL2 inhibitor, further worsened by the beta blocker, alpha-blocker and Cymbalta. All of these medicines have other beneficial effects for his quality of life. We'll try to reduce the beta blocker dose in half. Avoid natural diuretics such as  caffeine or alcohol. Encouraged him to stay very well-hydrated with lots of fluid and also a slight increase in sodium intake. He does not have either heart failure or  hypertension. Get up slowly and take a few seconds to make sure he is steady before walking off. 2. PVCs: These are currently asymptomatic and likely benign. Consider stopping the beta blocker altogether if he does not notice them after we reduced the dose.Marland Kitchen He does not have serious structural heart disease.  3. MVP: This does not currently require treatment and is unlikely to ever be a serious problem. Remains asymptomatic The degree of mitral regurgitation is not significant hemodynamically.  Mild left atrial dilatation is likely related to a mild degree of prolapse of the anterior leaflet and mild-moderate posteriorly directed eccentric mitral regurgitation. In turn, this might be the cause of his PVCs. Reevaluate by echo in the future if his symptoms evolve. Discussed the occasional, but unlikely progression to chordal rupture and severe mitral regurgitation and warning symptoms of such an event. 4. OSA: Compliant with CPAP 5. DM: Improved control onFarxiga 6. Obesity: despite previous gastric bypass. Mains and mild obesity range 7. HLP: Target LDL cholesterol less than 100 due to diabetes. He reports most recent LDL was 103, close to target range. Reviewed importance of weight loss and regular physical exercise.   Medication Adjustments/Labs and Tests Ordered: Current medicines are reviewed at length with the patient today.  Concerns regarding medicines are outlined above.  Medication changes, Labs and Tests ordered today are listed in the Patient Instructions below. There are no Patient Instructions on file for this visit.   Signed, Sanda Klein, MD  10/14/2016 10:39 AM    [facility] [address], [place], [place]  [address] Phone: [phone]; Fax: [phone]

## 2016-10-14 NOTE — Patient Instructions (Signed)
Dr Sallyanne Kuster has recommended making the following medication changes: 1. DECREASE Metoprolol to 12.5 mg (0.5 tablet) twice daily 2. STOP Tamsulosin 3. START Alfuzosin (Uroxatral) 10 mg - take 1 tablet by mouth daily  Your physician recommends that you schedule a follow-up appointment in 3 months.  If you need a refill on your cardiac medications before your next appointment, please call your pharmacy.   INCREASE your salt intake and drink more water!

## 2016-10-25 DIAGNOSIS — H2513 Age-related nuclear cataract, bilateral: Secondary | ICD-10-CM | POA: Diagnosis not present

## 2016-10-25 DIAGNOSIS — E119 Type 2 diabetes mellitus without complications: Secondary | ICD-10-CM | POA: Diagnosis not present

## 2016-10-25 DIAGNOSIS — D3132 Benign neoplasm of left choroid: Secondary | ICD-10-CM | POA: Diagnosis not present

## 2016-10-25 DIAGNOSIS — Z7984 Long term (current) use of oral hypoglycemic drugs: Secondary | ICD-10-CM | POA: Diagnosis not present

## 2016-10-25 DIAGNOSIS — H04123 Dry eye syndrome of bilateral lacrimal glands: Secondary | ICD-10-CM | POA: Diagnosis not present

## 2016-10-27 DIAGNOSIS — G894 Chronic pain syndrome: Secondary | ICD-10-CM | POA: Diagnosis not present

## 2016-10-27 DIAGNOSIS — Z79891 Long term (current) use of opiate analgesic: Secondary | ICD-10-CM | POA: Diagnosis not present

## 2016-10-27 DIAGNOSIS — M961 Postlaminectomy syndrome, not elsewhere classified: Secondary | ICD-10-CM | POA: Diagnosis not present

## 2016-11-18 DIAGNOSIS — E119 Type 2 diabetes mellitus without complications: Secondary | ICD-10-CM | POA: Diagnosis not present

## 2016-11-18 DIAGNOSIS — E7800 Pure hypercholesterolemia, unspecified: Secondary | ICD-10-CM | POA: Diagnosis not present

## 2016-11-18 DIAGNOSIS — F329 Major depressive disorder, single episode, unspecified: Secondary | ICD-10-CM | POA: Diagnosis not present

## 2016-11-18 DIAGNOSIS — E611 Iron deficiency: Secondary | ICD-10-CM | POA: Diagnosis not present

## 2016-11-21 DIAGNOSIS — E7800 Pure hypercholesterolemia, unspecified: Secondary | ICD-10-CM | POA: Diagnosis not present

## 2016-11-21 DIAGNOSIS — E611 Iron deficiency: Secondary | ICD-10-CM | POA: Diagnosis not present

## 2016-11-21 DIAGNOSIS — E119 Type 2 diabetes mellitus without complications: Secondary | ICD-10-CM | POA: Diagnosis not present

## 2016-11-23 ENCOUNTER — Ambulatory Visit (INDEPENDENT_AMBULATORY_CARE_PROVIDER_SITE_OTHER): Payer: Medicare Other | Admitting: Internal Medicine

## 2016-11-23 ENCOUNTER — Encounter: Payer: Self-pay | Admitting: Internal Medicine

## 2016-11-23 ENCOUNTER — Other Ambulatory Visit (INDEPENDENT_AMBULATORY_CARE_PROVIDER_SITE_OTHER): Payer: Medicare Other

## 2016-11-23 VITALS — BP 126/82 | HR 54 | Ht 74.0 in | Wt 254.0 lb

## 2016-11-23 DIAGNOSIS — J45991 Cough variant asthma: Secondary | ICD-10-CM | POA: Insufficient documentation

## 2016-11-23 DIAGNOSIS — G4733 Obstructive sleep apnea (adult) (pediatric): Secondary | ICD-10-CM

## 2016-11-23 DIAGNOSIS — R058 Other specified cough: Secondary | ICD-10-CM | POA: Insufficient documentation

## 2016-11-23 DIAGNOSIS — Z9989 Dependence on other enabling machines and devices: Secondary | ICD-10-CM

## 2016-11-23 DIAGNOSIS — R05 Cough: Secondary | ICD-10-CM

## 2016-11-23 LAB — CBC WITH DIFFERENTIAL/PLATELET
Basophils Absolute: 0.1 10*3/uL (ref 0.0–0.1)
Basophils Relative: 2.2 % (ref 0.0–3.0)
Eosinophils Absolute: 0.4 10*3/uL (ref 0.0–0.7)
Eosinophils Relative: 6.5 % — ABNORMAL HIGH (ref 0.0–5.0)
HEMATOCRIT: 38.7 % — AB (ref 39.0–52.0)
Hemoglobin: 12.6 g/dL — ABNORMAL LOW (ref 13.0–17.0)
LYMPHS ABS: 1.6 10*3/uL (ref 0.7–4.0)
LYMPHS PCT: 27.9 % (ref 12.0–46.0)
MCHC: 32.6 g/dL (ref 30.0–36.0)
MCV: 88.3 fl (ref 78.0–100.0)
MONOS PCT: 11.5 % (ref 3.0–12.0)
Monocytes Absolute: 0.7 10*3/uL (ref 0.1–1.0)
NEUTROS ABS: 3 10*3/uL (ref 1.4–7.7)
NEUTROS PCT: 51.9 % (ref 43.0–77.0)
Platelets: 251 10*3/uL (ref 150.0–400.0)
RBC: 4.38 Mil/uL (ref 4.22–5.81)
RDW: 15.2 % (ref 11.5–15.5)
WBC: 5.8 10*3/uL (ref 4.0–10.5)

## 2016-11-23 LAB — NITRIC OXIDE: NITRIC OXIDE: 40

## 2016-11-23 NOTE — Progress Notes (Signed)
Subjective:    Patient ID: [name], male    DOB: 04/02/1945   MRN: [id number]    Brief patient profile:  71 yowm quit smoking 1993 with h/o tree allergies age 71 produced seasonal sob and chest tight worse in spring but not requiring maint rx and then onset of same problems starting around 2010 around tgiving and worse symptoms year round since  2013   So self referred  to pulmonary clinic 09/27/2012 for eval of ? Asthma/copd.     History of Present Illness  09/27/2012 1st pulmonary eval / cc  Recurrent chest tightness and sob x 4 years seasonally does fine on cpap x 10 years [place] [employer] [place].  Maintained on inderol 40 mg qid and multiple inhalers including laba/lama > no better.  Albuterol helps 10% .  Prednisone helps a lot.  Assoc with mostly dry cough day > night.   Sob is variably present at rest and with activity, much worse when anxious, much worse last 4 weeks. symbicort 80 Take 2 puffs first thing in am and then another 2 puffs about 12 hours later.  Increase prilosec to 20 mg 2 Take 30-60 min before first meal of the day  Wean off the inderal  For cough use delsym 2 tsp every 12 hours and supplement with tramadol 50 mg every 4 hours if needed  GERD diet/   10/11/2012 f/u ov/Abbegayle Denault re cough Chief Complaint  Patient presents with  . Follow-up    Pt states that overall his breathing is much improved since the last visit. He is still clearing his throat, but this is some less since the last visit.   sob completely resolved, not limited by doe from desired activities  rec Work on inhaler technique:   Only use  symbicort 80 2 every 12 hours if you feel you need it for any respiratory flare For throat congestion/ cough try chlortrimeton 4 mg at bedtime and every 6 hours as needed    11/22/2012 f/u ov/Vela Render  Chief Complaint  Patient presents with  . Follow-up    Pt states breathing is doing well- taking symbicort 2 puffs every am and 1 puff at night. Cough is doing better, but he  is taking 100 mg tramadol at least once per day.    breathing fine through the day, worse as day goes on and hacking in evening p one dose symbicort, relieved by cpap > no cough/ no breathing problem at all while sleeping.  Breathing def better off inderal, cough is some better. 1st gen H1 during the day ? Not clear it's helping > mucus is clear  rec  Take delsym two tsp every 12 hours and supplement if needed with  tramadol 50 mg up to 2 every 4 hours to suppress the even the urge to cough.   continue symbicort 80 Take 2 puffs first thing in am and then another 2 puffs about 12 hours later but work on your technique - smooth and deep Prednisone 10 mg take  4 each am x 2 days,   2 each am x 2 days,  1 each am x 2 days and stop  Sinus CT 11/23/12 > severe sinusitis > rx x 21 days augmentin   12/20/2012 f/u ov/Jalea Bronaugh re: chronic cough/ ? All sinus related?  Chief Complaint  Patient presents with  . Follow-up    still feels like can't take a deep breath,had ct sinus 2 wks. ago,feels pr. in sinus ,pnd,cough-dry occass,feels like something  in throat,sob slightly better,occass. wheezing,mid chest tightness occass.,wants flu shot if ok  rec Please see patient coordinator before you leave today  to schedule ent eval> [facility] declined surgery  Once your cough and breathing have normalized you can try tapering off the symbicort 80 but restart immediately if worsen off it> stopped it and made no difference   11/23/2016  Extended f/u ov/Rhylan Kagel re: uacs/ chronic rhinitis no maint rx other than flonase  Chief Complaint  Patient presents with  . Follow-up    nasal congestion x years/sporadic  March and April ar the worst time for nasal congestion/assoc wheeze  but also peaks in June and late August x decades Assoc chest tightness resolves with xanax and or  mucinex but does correlate with nasal congestion Better 50% mucinex Better p 75% albuterol last used 6 m prior to OV    In terms of cpap doing fine on  present settings/ did bring chips/ wakes up feeling fine no ha and no hypersomnolence   No obvious other patterns to day to day or daytime variability or assoc excess/ purulent sputum or mucus plugs or hemoptysis or cp or   subjective wheeze or overt sinus or hb symptoms. No unusual exp hx or h/o childhood pna/ asthma or knowledge of premature birth.  Sleeping ok on cpap flat without nocturnal  or early am exacerbation  of respiratory  c/o's or need for noct saba. Also denies any obvious fluctuation of symptoms with weather or environmental changes or other aggravating or alleviating factors except as outlined above   Current Allergies, Complete Past Medical History, Past Surgical History, Family History, and Social History were reviewed in [employer] record.  ROS  The following are not active complaints unless bolded sore throat, dysphagia, dental problems, itching, sneezing,  nasal congestion or disharge of excess mucus or purulent secretions, ear ache,   fever, chills, sweats, unintended wt loss or wt gain, classically pleuritic or exertional cp,  orthopnea pnd or leg swelling, presyncope, palpitations, abdominal pain, anorexia, nausea, vomiting, diarrhea  or change in bowel habits or bladder habits, change in stools or change in urine, dysuria, hematuria,  rash, arthralgias, visual complaints, headache, numbness, weakness or ataxia or problems with walking or coordination,  change in mood/affect or memory.        Current Meds  Medication Sig  . acetaminophen (TYLENOL) 500 MG tablet Take 1,000 mg by mouth daily as needed for headache.  . albuterol (PROVENTIL HFA;VENTOLIN HFA) 108 (90 Base) MCG/ACT inhaler Inhale 2 puffs into the lungs every 6 (six) hours as needed for wheezing or shortness of breath.  . ALPRAZolam (XANAX) 0.5 MG tablet Take 0.5-1 mg by mouth 3 (three) times daily as needed for anxiety. Reported on 05/19/2015  . aspirin EC 81 MG tablet Take 81 mg by mouth  daily as needed for mild pain.  . DULoxetine (CYMBALTA) 60 MG capsule Take 2 capsules by mouth daily.  Marland Kitchen dutasteride (AVODART) 0.5 MG capsule Take 0.5 mg by mouth daily.  Marland Kitchen FARXIGA 10 MG TABS tablet Take 10 mg by mouth daily.  . fluticasone (FLONASE) 50 MCG/ACT nasal spray Place 2 sprays into the nose 2 (two) times daily.  Marland Kitchen HYDROcodone-acetaminophen (NORCO) 10-325 MG per tablet Take 1-2 tablets by mouth every 6 (six) hours as needed for moderate pain. (Patient taking differently: Take 1 tablet by mouth as needed for moderate pain. )  . metFORMIN (GLUCOPHAGE) 500 MG tablet Take 1,000 mg by mouth 2 (two) times daily  with a meal.  . metoprolol tartrate (LOPRESSOR) 25 MG tablet Take 0.5 tablets (12.5 mg total) by mouth 2 (two) times daily.  . Multiple Vitamins-Minerals (CENTRUM SILVER ADULT 50+ PO) Take 1 tablet by mouth daily.  . pioglitazone (ACTOS) 45 MG tablet Take 45 mg by mouth daily.   . pravastatin (PRAVACHOL) 40 MG tablet Take 40 mg by mouth daily.  Marland Kitchen tiZANidine (ZANAFLEX) 4 MG tablet Take 1 tablet (4 mg total) by mouth every 6 (six) hours as needed for muscle spasms.  . traMADol (ULTRAM) 50 MG tablet TAKE 1 TABLET BY MOUTH 4 TIMES A DAY AS NEEDED  . UNABLE TO FIND Med Name: CPAP 10 cm                Objective:   Physical Exam  amb wm talks very loudly  with nasal tone to voice       11/23/2016       254  12/20/2012       266  11/22/2012       259  10/11/2012       254     09/27/12 249 lb (112.946 kg)  04/22/11 257 lb 15 oz (117 kg)  04/22/11 257 lb 15 oz (117 kg)    HEENT: nl dentition, and orophanx. Nl external ear canals without cough reflex - moderate bilateral non-specific turbinate edema     NECK :  without JVD/Nodes/TM/ nl carotid upstrokes bilaterally   LUNGS: no acc muscle use  clear to A and P bilaterally with mostly pseudowheezing with fvc/ resolves with plm    CV:  RRR  no s3 or murmur or increase in P2, no edema   ABD:  soft and nontender with nl  excursion in the supine position. No bruits or organomegaly, bowel sounds nl  MS:  warm without deformities, calf tenderness, cyanosis or clubbing  SKIN: warm and dry without lesions                Assessment & Plan:

## 2016-11-23 NOTE — Assessment & Plan Note (Addendum)
Dxed 1996  Titration study 2008 with optimal pressure 14cm. S/p bariatric surgery.  Auto 11/2012: optimal pressure 10cm - 11/30/2015  Using > 4h 100% with autoset 7 and ahi 3.6    appears to be using it effectively/ regularly and benefiting from it  > Referred back to sleep medicine in 6 months   In meantime Etiology and pathophysiology of osa including relationship to obesity reviewed in detail

## 2016-11-23 NOTE — Assessment & Plan Note (Signed)
-  Sinus CT 11/23/12 Nearly pansinus paranasal sinus inflammatory changes with fluid levels and bubbly opacity most compatible with acute sinusitis. - [facility]  eval 2014 rec surgery > declined - Allergy profile 11/23/2016 >  Eos 0. /  IgE    May be assoc with cough variant asthma (see separate a/p)   For now best rx is flonase/asa > see avs for instructions unique to this ov   F/u allergy prn if decides against ent re-referral

## 2016-11-23 NOTE — Assessment & Plan Note (Addendum)
FENO 11/23/2016  =   40 off all rx - Spirometry 11/23/2016  FEV1 4.06 (106%)  Ratio 82 with min curvature  Off all rx   The feno is intermediate but note this is not during a typical flare and has not needed any saba in months so All goals of chronic asthma control met including optimal function and elimination of symptoms with minimal need for rescue therapy.  Contingencies discussed in full including contacting this office immediately if not controlling the symptoms using the rule of two's.  If flares/ breaks rule of 2s' can just use symb 80 up to 2puffs q 12 h prn symptoms rather than bid maintenance dosing  given the very mild nature of the dz and based on the article in nejm from May 2018  Validating that  this reduces ics exposure s worsening asthma exac rates ( as has been the [place] experience as well for years now).      Will do allergy testing and hold off escalating rx for now and focus on control of upper airways first as there may be a signficant sinobronchial reflex at play as well   I had an extended discussion with the patient reviewing all relevant studies completed to date and  lasting 25 minutes of a 40  minute office visit to re-estalish p last ov 4 y ago   re  severe non-specific but potentially very serious refractory respiratory symptoms of uncertain and potentially multiple  etiologies.   Each maintenance medication was reviewed in detail including most importantly the difference between maintenance and prns and under what circumstances the prns are to be triggered using an action plan format that is not reflected in the computer generated alphabetically organized AVS.    Please see AVS for specific instructions unique to this office visit that I personally wrote and verbalized to the the pt in detail and then reviewed with pt  by my nurse highlighting any changes in therapy/plan of care  recommended at today's visit.

## 2016-11-23 NOTE — Patient Instructions (Addendum)
Nasal steroids (flonase)  have no immediate benefit in terms of improving symptoms.  To help them reached the target tissue, you should use Afrin two puffs every 12 hours applied one min before using the nasal steroids.  Afrin should be stopped after no more than 5 days.  If the symptoms worsen, Afrin can be restarted after 5 days off of therapy to prevent rebound congestion from overuse of Afrin.     If your breathing worsens or you need to use your rescue inhaler more than twice weekly or wake up more than twice a month with any respiratory symptoms or require more than two rescue inhalers per year, we need to see you right away because this means we're not controlling the underlying problem (inflammation) adequately.  Rescue inhalers do not control inflammation and overuse can lead to unnecessary and costly consequences.  They can make you feel better temporarily but eventually they will quit working effectively much as sleep aids lead to more insomnia if used regularly.    Please remember to go to the lab department downstairs in the basement  for your tests - we will call you with the results when they are available to decide the next step but unless your allergies cause increased need for albuterol there is no need to follow up in pulmonary clinic regularly - we'll see you as needed      Please schedule a follow up visit in 6 months but call sooner if needed to see Dr Elsworth Soho - bring chip from cpap machine

## 2016-11-24 LAB — RESPIRATORY ALLERGY PROFILE REGION II ~~LOC~~
Allergen, Cedar tree, t12: <0.1 kU/L
Allergen, Comm Silver Birch, t9: <0.1 kU/L
Allergen, D pternoyssinus,d7: <0.1 kU/L
Allergen, Mouse Urine Protein, e78: <0.1 kU/L
Allergen, Mulberry, t76: <0.1 kU/L
Allergen, P. notatum, m1: <0.1 kU/L
Aspergillus fumigatus, m3: <0.1 kU/L
CLADOSPORIUM HERBARUM (M2) IGE: <0.1 kU/L
CLASS: 0
CLASS: 0
CLASS: 0
CLASS: 0
CLASS: 0
CLASS: 0
CLASS: 0
CLASS: 0
CLASS: 0
CLASS: 0
COMMON RAGWEED (SHORT) (W1) IGE: <0.1 kU/L
Cat Dander: <0.1 kU/L
Class: 0
Class: 0
Class: 0
Class: 0
Class: 0
Class: 0
Class: 0
Class: 0
Class: 0
Class: 0
Class: 0
Class: 0
Class: 0
Class: 0
D. farinae: <0.1 kU/L
IGE (IMMUNOGLOBULIN E), SERUM: 29 kU/L (ref <=114)
Johnson Grass: <0.1 kU/L
Pecan/Hickory Tree IgE: <0.1 kU/L
Rough Pigweed  IgE: <0.1 kU/L
Timothy Grass: <0.1 kU/L

## 2016-11-24 LAB — INTERPRETATION:

## 2016-11-25 ENCOUNTER — Ambulatory Visit: Payer: Medicare Other | Admitting: Internal Medicine

## 2016-11-28 ENCOUNTER — Telehealth: Payer: Self-pay | Admitting: Internal Medicine

## 2016-11-28 NOTE — Progress Notes (Signed)
LMTCB

## 2016-11-28 NOTE — Telephone Encounter (Signed)
I called pt and he states he spoke to someone already and nothing further is needed.

## 2016-12-05 DIAGNOSIS — J4520 Mild intermittent asthma, uncomplicated: Secondary | ICD-10-CM | POA: Diagnosis not present

## 2016-12-05 DIAGNOSIS — Z6832 Body mass index (BMI) 32.0-32.9, adult: Secondary | ICD-10-CM | POA: Diagnosis not present

## 2016-12-05 DIAGNOSIS — F3342 Major depressive disorder, recurrent, in full remission: Secondary | ICD-10-CM | POA: Diagnosis not present

## 2016-12-05 DIAGNOSIS — Z23 Encounter for immunization: Secondary | ICD-10-CM | POA: Diagnosis not present

## 2016-12-05 DIAGNOSIS — E119 Type 2 diabetes mellitus without complications: Secondary | ICD-10-CM | POA: Diagnosis not present

## 2016-12-05 DIAGNOSIS — E669 Obesity, unspecified: Secondary | ICD-10-CM | POA: Diagnosis not present

## 2017-01-16 ENCOUNTER — Encounter: Payer: Self-pay | Admitting: Cardiovascular Disease

## 2017-01-16 ENCOUNTER — Ambulatory Visit (INDEPENDENT_AMBULATORY_CARE_PROVIDER_SITE_OTHER): Payer: Medicare Other | Admitting: Cardiovascular Disease

## 2017-01-16 VITALS — BP 98/62 | HR 56 | Ht 74.0 in | Wt 249.0 lb

## 2017-01-16 DIAGNOSIS — E669 Obesity, unspecified: Secondary | ICD-10-CM

## 2017-01-16 DIAGNOSIS — E782 Mixed hyperlipidemia: Secondary | ICD-10-CM | POA: Diagnosis not present

## 2017-01-16 DIAGNOSIS — G4733 Obstructive sleep apnea (adult) (pediatric): Secondary | ICD-10-CM

## 2017-01-16 DIAGNOSIS — I340 Nonrheumatic mitral (valve) insufficiency: Secondary | ICD-10-CM | POA: Diagnosis not present

## 2017-01-16 DIAGNOSIS — I493 Ventricular premature depolarization: Secondary | ICD-10-CM

## 2017-01-16 DIAGNOSIS — IMO0001 Reserved for inherently not codable concepts without codable children: Secondary | ICD-10-CM

## 2017-01-16 DIAGNOSIS — E1165 Type 2 diabetes mellitus with hyperglycemia: Secondary | ICD-10-CM | POA: Diagnosis not present

## 2017-01-16 DIAGNOSIS — Z9989 Dependence on other enabling machines and devices: Secondary | ICD-10-CM | POA: Diagnosis not present

## 2017-01-16 DIAGNOSIS — I951 Orthostatic hypotension: Secondary | ICD-10-CM | POA: Diagnosis not present

## 2017-01-16 MED ORDER — ALFUZOSIN HCL ER 10 MG PO TB24: 10.0000 mg | ORAL_TABLET | Freq: Every day | ORAL | 3 refills | Status: DC

## 2017-01-16 NOTE — Patient Instructions (Signed)
Dr Sallyanne Kuster has recommended making the following medication changes: 1. STOP Tamsulosin 2. START Alfuzosin (Uroxatral) 10 mg - take 1 tablet by mouth daily  If you have continued or worsening dizzy spells, please stop metoprolol all together.  Dr Sallyanne Kuster recommends that you schedule a follow-up appointment in 6 months. You will receive a reminder letter in the mail two months in advance. If you don't receive a letter, please call our office to schedule the follow-up appointment.  If you need a refill on your cardiac medications before your next appointment, please call your pharmacy.

## 2017-01-16 NOTE — Progress Notes (Signed)
.    Cardiology Office Note    Date:  01/18/2017   ID:  Ronald Pavlov., DOB 1945-05-12, MRN [id number]  PCP:  Lajean Manes, MD  Cardiologist:   Sanda Klein, MD   Chief Complaint  Patient presents with  . Follow-up    3 months  . Headache    History of Present Illness:  Ronald Littlejohn. is a 71 y.o. male  With DM, hyperlipidemia and mild mitral valve prolapse of the anterior leaflet and mild to moderate posteriorly directed mitral insufficiency.   He continues to have problems with syncope with a pattern strongly suggestive of orthostatic hypotension.  Episodes only happen late in the evening, after he has taken tamsulosin.  At his last appointment we tried to switch from this medication to Uroxatrol, but he forgot what that medication was prescribed for and has not yet made the change.  He still takes a small dose of beta-blocker but has cut back to once daily (metoprolol tartrate 12 mg in the morning).  He is not taking any other medications with direct effect on blood pressure, but is taking dapagliflozin for diabetes.  He has had at least 2 falls with a very quick recovery afterwards.   Glycemic control is good with hemoglobin A1c 6.7%, total cholesterol 123, HDL 48, LDL 55, triglycerides 102.  Creatinine is 0.9 and liver function tests are normal.  His potassium was 4.8.  All these labs are from November 21 2016 at [facility].  Hemoglobin 12.6.   He has substantial difficulty with urination if he doesn't take an alpha blocker. He is taking a very low dose of metoprolol for palpitations, which are probably PVCs related to his mitral valve prolapse.   The patient specifically denies any chest pain at rest exertion, dyspnea at rest or with exertion, orthopnea, paroxysmal nocturnal dyspnea, focal neurological deficits, intermittent claudication, lower extremity edema, unexplained weight gain, cough, hemoptysis or wheezing.  He reports compliance with CPAP for  obstructive sleep apnea. He has 2 diabetes mellitus and hyperlipidemia but does not have congestive heart failure or coronary disease. He had a normal nuclear stress test in April 2017.  Past Medical History:  Diagnosis Date  . Arthritis   . Asthma    anxiety, tree/grass pollen  . BPH (benign prostatic hyperplasia)   . Bronchitis    hx of  . Bulge of cervical disc without myelopathy   . Chest pain    a. 06/2015 MV: EF 56%, no ischemia/infarct.  . Complication of anesthesia 7/84/69   no complications but just says he typically needs "more than expected" to anesthetize; needs CPAP (setting 10) in recovery  . Depression    ADD  . Diabetes mellitus    PCP [name], type 2  . Insomnia   . Mitral valve prolapse    a. 06/2015 Echo: EF 65-70%, mild LVH, no rwma, mild MVP of ant leaflet and mild to mod posteriorly directed MR, mildly dil La.  . Obesity   . PVC's (premature ventricular contractions)   . Recurrent upper respiratory infection (URI)    states Dr Wynelle Link aware- no fever- states is improving  . Sleep apnea    settings 10.6  / followed by [name] study years ago  . Tremor    d/t lithium    Past Surgical History:  Procedure Laterality Date  . CARDIAC CATHETERIZATION     2008 pre op gastric bypass  . COLONOSCOPY W/ POLYPECTOMY    .  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 JOINT REPLACEMENT     Left, Right Knee, Right hip  . KNEE ARTHROSCOPY     right x 3-4, left x 2  . KNEE ARTHROTOMY  ~1991   right  . LUMBAR FUSION  04/03/2013   [name], L5-S1  . POLYPECTOMY     vocal cords 1992    Current Medications: Outpatient Medications Prior to Visit  Medication Sig Dispense Refill  . acetaminophen (TYLENOL) 500 MG tablet Take 1,000 mg by mouth daily as needed for headache.    . albuterol (PROVENTIL HFA;VENTOLIN HFA) 108 (90 Base) MCG/ACT inhaler Inhale 2 puffs into the lungs every 6 (six) hours as needed for wheezing or shortness of breath.    . ALPRAZolam (XANAX) 0.5 MG  tablet Take 0.5-1 mg by mouth 3 (three) times daily as needed for anxiety. Reported on 05/19/2015    . aspirin EC 81 MG tablet Take 81 mg by mouth daily as needed for mild pain.    . DULoxetine (CYMBALTA) 60 MG capsule Take 2 capsules by mouth daily.  5  . dutasteride (AVODART) 0.5 MG capsule Take 0.5 mg by mouth daily.    Marland Kitchen FARXIGA 10 MG TABS tablet Take 10 mg by mouth daily.  5  . fluticasone (FLONASE) 50 MCG/ACT nasal spray Place 2 sprays into the nose 2 (two) times daily.    Marland Kitchen HYDROcodone-acetaminophen (NORCO) 10-325 MG per tablet Take 1-2 tablets by mouth every 6 (six) hours as needed for moderate pain. (Patient taking differently: Take 1 tablet by mouth as needed for moderate pain. ) 90 tablet 0  . metFORMIN (GLUCOPHAGE) 500 MG tablet Take 1,000 mg by mouth 2 (two) times daily with a meal.    . metoprolol tartrate (LOPRESSOR) 25 MG tablet Take 0.5 tablets (12.5 mg total) by mouth 2 (two) times daily. 90 tablet 3  . Multiple Vitamins-Minerals (CENTRUM SILVER ADULT 50+ PO) Take 1 tablet by mouth daily.    . pioglitazone (ACTOS) 45 MG tablet Take 45 mg by mouth daily.     . pravastatin (PRAVACHOL) 40 MG tablet Take 40 mg by mouth daily.    Marland Kitchen tiZANidine (ZANAFLEX) 4 MG tablet Take 1 tablet (4 mg total) by mouth every 6 (six) hours as needed for muscle spasms. 40 tablet 0  . traMADol (ULTRAM) 50 MG tablet TAKE 1 TABLET BY MOUTH 4 TIMES A DAY AS NEEDED  2  . UNABLE TO FIND Med Name: CPAP 10 cm     No facility-administered medications prior to visit.      Allergies:   Breo ellipta [fluticasone furoate-vilanterol]; Demerol [meperidine]; Lamotrigine; and Nsaids   Social History   Socioeconomic History  . Marital status: Married    Spouse name: None  . Number of children: None  . Years of education: college  . Highest education level: None  Social Needs  . Financial resource strain: None  . Food insecurity - worry: None  . Food insecurity - inability: None  . Transportation needs -  medical: None  . Transportation needs - non-medical: None  Occupational History  . Occupation: Retired Equities trader: [employer]  Tobacco Use  . Smoking status: Former Smoker    Packs/day: 1.50    Years: 20.00    Pack years: 30.00    Types: Cigarettes    Last attempt to quit: 04/17/1990    Years since quitting: 26.7  . Smokeless tobacco: Never Used  Substance and Sexual  Activity  . Alcohol use: No  . Drug use: No  . Sexual activity: None  Other Topics Concern  . None  Social History Narrative   Patient works for [employer]. Patient has masters degree. Patient is married.     Family History:  The patient's family history includes Anxiety disorder in his mother; Asthma in his maternal grandmother; Heart disease in his father.   ROS:   Please see the history of present illness.    ROS All other systems reviewed and are negative.   PHYSICAL EXAM:   VS:  BP 98/62   Pulse (!) 56   Ht 6\' 2"  (1.88 m)   Wt 249 lb (112.9 kg)   BMI 31.97 kg/m     General: Alert, oriented x3, no distress, mildly obese Head: no evidence of trauma, PERRL, EOMI, no exophtalmos or lid lag, no myxedema, no xanthelasma; normal ears, nose and oropharynx Neck: normal jugular venous pulsations and no hepatojugular reflux; brisk carotid pulses without delay and no carotid bruits Chest: clear to auscultation, no signs of consolidation by percussion or palpation, normal fremitus, symmetrical and full respiratory excursions Cardiovascular: ormal position and quality of the apical impulse, regular rhythm, normal first and second heart sounds, no diastolic murmurs, rubs or gallops. There is no systolic click even with the Valsalva maneuver. There is a grade 1/6 late systolic murmur heard at the apex. Abdomen: no tenderness or distention, no masses by palpation, no abnormal pulsatility or arterial bruits, normal bowel sounds, no hepatosplenomegaly Extremities: no clubbing, cyanosis or edema; 2+ radial, ulnar and  brachial pulses bilaterally; 2+ right femoral, posterior tibial and dorsalis pedis pulses; 2+ left femoral, posterior tibial and dorsalis pedis pulses; no subclavian or femoral bruits Neurological: grossly nonfocal Psych: Normal mood and affect   Wt Readings from Last 3 Encounters:  01/16/17 249 lb (112.9 kg)  11/23/16 254 lb (115.2 kg)  10/14/16 252 lb (114.3 kg)      Studies/Labs Reviewed:   EKG:  EKG is not ordered today.    Recent Labs: 11/23/2016: Hemoglobin 12.6; Platelets 251.0   Lipid Panel    Component Value Date/Time   CHOL 218 (H) 06/29/2015 1929   TRIG 173 (H) 06/29/2015 1929   HDL 42 06/29/2015 1929   CHOLHDL 5.2 06/29/2015 1929   VLDL 35 06/29/2015 1929   LDLCALC 141 (H) 06/29/2015 1929    November 21 2016 at [facility] hemoglobin A1c 6.7%, total cholesterol 123, HDL 48, LDL 55, triglycerides 102.   Creatinine 0.9 , liver function tests are normal, potassium 4.8,  Hemoglobin 12.6.  ASSESSMENT:    1. Orthostatic hypotension   2. PVC's (premature ventricular contractions)   3. Mitral valve insufficiency, unspecified etiology   4. OSA on CPAP   5. Uncontrolled diabetes mellitus without complication, without long-term current use of insulin (HCC)   6. Obesity (BMI 30.0-34.9)   7. Mixed hyperlipidemia      PLAN:  In order of problems listed above:  1. Orthostatic hypotension: Reviewed recommendation to switch to Uroxatral.  Stop beta-blocker.  Continue to hydrate well, eat a relatively higher sodium diet.  Avoid sudden changes in position. 2. PVCs: These are minimally symptomatic and benign, I think he would do just as well without the beta-blocker as long as he is reassured that there are not dangerous..  3. MVP: Other than possibly contributing to his PVCs, this is asymptomatic and not likely to be of clinical importance in the near future. 4. OSA: He has had  good clinical benefit and reports compliance with CPAP 5. DM: Wilder Glade has helped  with glycemic control but may be partly responsible for orthostatic hypotension via obligatory diuresis with glucosuria 6. Obesity: despite previous gastric bypass.  he has lost additional weight since his last appointment but remains mildly obese 7. HLP: lipid parameters have improved and are well within target range on his recent lipid profile   Medication Adjustments/Labs and Tests Ordered: Current medicines are reviewed at length with the patient today.  Concerns regarding medicines are outlined above.  Medication changes, Labs and Tests ordered today are listed in the Patient Instructions below. Patient Instructions  Dr Sallyanne Kuster has recommended making the following medication changes: 1. STOP Tamsulosin 2. START Alfuzosin (Uroxatral) 10 mg - take 1 tablet by mouth daily  If you have continued or worsening dizzy spells, please stop metoprolol all together.  Dr Sallyanne Kuster recommends that you schedule a follow-up appointment in 6 months. You will receive a reminder letter in the mail two months in advance. If you don't receive a letter, please call our office to schedule the follow-up appointment.  If you need a refill on your cardiac medications before your next appointment, please call your pharmacy.    Signed, Sanda Klein, MD  01/18/2017 8:35 AM    [facility] [place], [place],   [address] Phone: [phone]; Fax: [phone]

## 2017-01-18 DIAGNOSIS — I951 Orthostatic hypotension: Secondary | ICD-10-CM | POA: Insufficient documentation

## 2017-01-27 DIAGNOSIS — M961 Postlaminectomy syndrome, not elsewhere classified: Secondary | ICD-10-CM | POA: Diagnosis not present

## 2017-01-27 DIAGNOSIS — G894 Chronic pain syndrome: Secondary | ICD-10-CM | POA: Diagnosis not present

## 2017-03-20 DIAGNOSIS — F3130 Bipolar disorder, current episode depressed, mild or moderate severity, unspecified: Secondary | ICD-10-CM | POA: Diagnosis not present

## 2017-03-27 DIAGNOSIS — M47816 Spondylosis without myelopathy or radiculopathy, lumbar region: Secondary | ICD-10-CM | POA: Diagnosis not present

## 2017-04-07 DIAGNOSIS — E119 Type 2 diabetes mellitus without complications: Secondary | ICD-10-CM | POA: Diagnosis not present

## 2017-04-07 DIAGNOSIS — E669 Obesity, unspecified: Secondary | ICD-10-CM | POA: Diagnosis not present

## 2017-04-07 DIAGNOSIS — Z79899 Other long term (current) drug therapy: Secondary | ICD-10-CM | POA: Diagnosis not present

## 2017-04-07 DIAGNOSIS — Z7984 Long term (current) use of oral hypoglycemic drugs: Secondary | ICD-10-CM | POA: Diagnosis not present

## 2017-04-07 DIAGNOSIS — E7800 Pure hypercholesterolemia, unspecified: Secondary | ICD-10-CM | POA: Diagnosis not present

## 2017-04-07 DIAGNOSIS — Z6832 Body mass index (BMI) 32.0-32.9, adult: Secondary | ICD-10-CM | POA: Diagnosis not present

## 2017-04-28 DIAGNOSIS — Z79891 Long term (current) use of opiate analgesic: Secondary | ICD-10-CM | POA: Diagnosis not present

## 2017-04-28 DIAGNOSIS — G894 Chronic pain syndrome: Secondary | ICD-10-CM | POA: Diagnosis not present

## 2017-04-28 DIAGNOSIS — M545 Low back pain: Secondary | ICD-10-CM | POA: Diagnosis not present

## 2017-05-10 DIAGNOSIS — M47816 Spondylosis without myelopathy or radiculopathy, lumbar region: Secondary | ICD-10-CM | POA: Diagnosis not present

## 2017-05-17 DIAGNOSIS — D539 Nutritional anemia, unspecified: Secondary | ICD-10-CM | POA: Diagnosis not present

## 2017-05-22 ENCOUNTER — Other Ambulatory Visit: Payer: Self-pay | Admitting: Geriatric Medicine

## 2017-05-22 DIAGNOSIS — E119 Type 2 diabetes mellitus without complications: Secondary | ICD-10-CM | POA: Diagnosis not present

## 2017-05-22 DIAGNOSIS — R109 Unspecified abdominal pain: Secondary | ICD-10-CM

## 2017-05-22 DIAGNOSIS — Z7984 Long term (current) use of oral hypoglycemic drugs: Secondary | ICD-10-CM | POA: Diagnosis not present

## 2017-05-22 DIAGNOSIS — D509 Iron deficiency anemia, unspecified: Secondary | ICD-10-CM | POA: Diagnosis not present

## 2017-05-29 DIAGNOSIS — M47816 Spondylosis without myelopathy or radiculopathy, lumbar region: Secondary | ICD-10-CM | POA: Diagnosis not present

## 2017-06-03 ENCOUNTER — Ambulatory Visit
Admission: RE | Admit: 2017-06-03 | Discharge: 2017-06-03 | Disposition: A | Discharge status: Home / Self Care | Payer: Medicare Other | Source: Ambulatory Visit | Attending: Geriatric Medicine | Admitting: Geriatric Medicine

## 2017-06-03 DIAGNOSIS — N200 Calculus of kidney: Secondary | ICD-10-CM | POA: Diagnosis not present

## 2017-06-03 DIAGNOSIS — R109 Unspecified abdominal pain: Secondary | ICD-10-CM

## 2017-06-03 MED ORDER — IOPAMIDOL (ISOVUE-300) INJECTION 61%
125.0000 mL | Freq: Once | INTRAVENOUS | Status: AC | PRN
  Administered 2017-06-03: 125 mL via INTRAVENOUS

## 2017-06-26 DIAGNOSIS — M47816 Spondylosis without myelopathy or radiculopathy, lumbar region: Secondary | ICD-10-CM | POA: Diagnosis not present

## 2017-06-26 DIAGNOSIS — M4316 Spondylolisthesis, lumbar region: Secondary | ICD-10-CM | POA: Diagnosis not present

## 2017-06-26 DIAGNOSIS — M5136 Other intervertebral disc degeneration, lumbar region: Secondary | ICD-10-CM | POA: Diagnosis not present

## 2017-07-18 DIAGNOSIS — F3130 Bipolar disorder, current episode depressed, mild or moderate severity, unspecified: Secondary | ICD-10-CM | POA: Diagnosis not present

## 2017-08-01 DIAGNOSIS — E7800 Pure hypercholesterolemia, unspecified: Secondary | ICD-10-CM | POA: Diagnosis not present

## 2017-08-01 DIAGNOSIS — Z1389 Encounter for screening for other disorder: Secondary | ICD-10-CM | POA: Diagnosis not present

## 2017-08-01 DIAGNOSIS — F3342 Major depressive disorder, recurrent, in full remission: Secondary | ICD-10-CM | POA: Diagnosis not present

## 2017-08-01 DIAGNOSIS — E1169 Type 2 diabetes mellitus with other specified complication: Secondary | ICD-10-CM | POA: Diagnosis not present

## 2017-08-01 DIAGNOSIS — Z23 Encounter for immunization: Secondary | ICD-10-CM | POA: Diagnosis not present

## 2017-08-01 DIAGNOSIS — Z7984 Long term (current) use of oral hypoglycemic drugs: Secondary | ICD-10-CM | POA: Diagnosis not present

## 2017-08-01 DIAGNOSIS — Z0000 Encounter for general adult medical examination without abnormal findings: Secondary | ICD-10-CM | POA: Diagnosis not present

## 2017-08-01 DIAGNOSIS — D509 Iron deficiency anemia, unspecified: Secondary | ICD-10-CM | POA: Diagnosis not present

## 2017-08-03 ENCOUNTER — Ambulatory Visit (INDEPENDENT_AMBULATORY_CARE_PROVIDER_SITE_OTHER): Payer: Medicare Other | Admitting: Cardiovascular Disease

## 2017-08-03 ENCOUNTER — Encounter: Payer: Self-pay | Admitting: Cardiovascular Disease

## 2017-08-03 VITALS — BP 110/80 | HR 63 | Ht 74.0 in | Wt 245.0 lb

## 2017-08-03 DIAGNOSIS — E782 Mixed hyperlipidemia: Secondary | ICD-10-CM | POA: Diagnosis not present

## 2017-08-03 DIAGNOSIS — E669 Obesity, unspecified: Secondary | ICD-10-CM

## 2017-08-03 DIAGNOSIS — I341 Nonrheumatic mitral (valve) prolapse: Secondary | ICD-10-CM

## 2017-08-03 DIAGNOSIS — G4733 Obstructive sleep apnea (adult) (pediatric): Secondary | ICD-10-CM

## 2017-08-03 DIAGNOSIS — E1169 Type 2 diabetes mellitus with other specified complication: Secondary | ICD-10-CM | POA: Diagnosis not present

## 2017-08-03 DIAGNOSIS — I493 Ventricular premature depolarization: Secondary | ICD-10-CM

## 2017-08-03 NOTE — Patient Instructions (Signed)
Dr Croitoru recommends that you schedule a follow-up appointment in 12 months. You will receive a reminder letter in the mail two months in advance. If you don't receive a letter, please call our office to schedule the follow-up appointment.  If you need a refill on your cardiac medications before your next appointment, please call your pharmacy. 

## 2017-08-03 NOTE — Progress Notes (Signed)
.    Cardiology Office Note    Date:  08/05/2017   ID:  Ronald Pavlov., DOB Jun 18, 1945, MRN [id number]  PCP:  Lajean Manes, MD  Cardiologist:   Sanda Klein, MD   Chief Complaint  Patient presents with  . Follow-up    6 months  . Shortness of Breath  . Edema    At times    History of Present Illness:  [name]. is a 72 y.o. male  With DM, hyperlipidemia and mild mitral valve prolapse of the anterior leaflet and mild to moderate posteriorly directed mitral insufficiency.   This is last appointment he has not had any further problems with orthostatic hypotension or syncope.  He reports that his glycemic control is not very good.  Typically when he wakes up his glucose will be around 130, but in the afternoons it often drops to 80.  He has occasional mild symptoms of hypoglycemia.  His hemoglobin A1c has been 96.2-6.8% range.  He has not had any chest pain or dyspnea denies leg edema, claudication, focal neurological events, orthopnea, PND.  Is not clear whether his orthostatic hypotension resolved after he stopped his beta-blocker, alpha-blocker, SGLT2 inhibitor or all of the above.  He has not had problems with urination.  Despite stopping his beta-blocker he is not troubled by palpitations.  He has PVCs on EKG today but is not aware of them.  He reports compliance with CPAP for obstructive sleep apnea. He has 2 diabetes mellitus and hyperlipidemia but does not have congestive heart failure or coronary disease. He had a normal nuclear stress test in April 2017.  Past Medical History:  Diagnosis Date  . Arthritis   . Asthma    anxiety, tree/grass pollen  . BPH (benign prostatic hyperplasia)   . Bronchitis    hx of  . Bulge of cervical disc without myelopathy   . Chest pain    a. 06/2015 MV: EF 56%, no ischemia/infarct.  . Complication of anesthesia 1/66/06   no complications but just says he typically needs "more than expected" to anesthetize; needs CPAP  (setting 10) in recovery  . Depression    ADD  . Diabetes mellitus    PCP [name], type 2  . Insomnia   . Mitral valve prolapse    a. 06/2015 Echo: EF 65-70%, mild LVH, no rwma, mild MVP of ant leaflet and mild to mod posteriorly directed MR, mildly dil La.  . Obesity   . PVC's (premature ventricular contractions)   . Recurrent upper respiratory infection (URI)    states Dr Wynelle Link aware- no fever- states is improving  . Sleep apnea    settings 10.6  / followed by [name] study years ago  . Tremor    d/t lithium    Past Surgical History:  Procedure Laterality Date  . CARDIAC CATHETERIZATION     2008 pre op gastric bypass  . COLONOSCOPY W/ POLYPECTOMY    . 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 JOINT REPLACEMENT     Left, Right Knee, Right hip  . KNEE ARTHROSCOPY     right x 3-4, left x 2  . KNEE ARTHROTOMY  ~1991   right  . LUMBAR FUSION  04/03/2013   [name], L5-S1  . POLYPECTOMY     vocal cords 1992  . 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   Procedure: LEFT TOTAL HIP ARTHROPLASTY ANTERIOR APPROACH;  Surgeon: [name], MD;  Location: [facility];  Service: Orthopedics;  Laterality: Left;  . TOTAL HIP REVISION  04/22/2011   Procedure: TOTAL HIP REVISION;  Surgeon: Gearlean Alf, MD;  Location: [facility];  Service: Orthopedics;  Laterality: Right;    Current Medications: Outpatient Medications Prior to Visit  Medication Sig Dispense Refill  . acetaminophen (TYLENOL) 500 MG tablet Take 1,000 mg by mouth daily as needed for headache.    . albuterol (PROVENTIL HFA;VENTOLIN HFA) 108 (90 Base) MCG/ACT inhaler Inhale 2 puffs into the lungs every 6 (six) hours as needed for wheezing or shortness of breath.    . ALPRAZolam (XANAX) 0.5 MG tablet Take 0.5-1 mg by mouth 3 (three) times daily as needed for anxiety. Reported on 05/19/2015    . aspirin EC 81 MG tablet Take 81 mg by mouth daily as needed for mild pain.    . DULoxetine (CYMBALTA) 60 MG capsule Take 2 capsules by mouth  daily.  5  . dutasteride (AVODART) 0.5 MG capsule Take 0.5 mg by mouth daily.    . fluticasone (FLONASE) 50 MCG/ACT nasal spray Place 2 sprays into the nose 2 (two) times daily.    Marland Kitchen HYDROcodone-acetaminophen (NORCO) 10-325 MG per tablet Take 1-2 tablets by mouth every 6 (six) hours as needed for moderate pain. (Patient taking differently: Take 1 tablet by mouth as needed for moderate pain. ) 90 tablet 0  . metFORMIN (GLUCOPHAGE) 500 MG tablet Take 1,000 mg by mouth 2 (two) times daily with a meal.    . Multiple Vitamins-Minerals (CENTRUM SILVER ADULT 50+ PO) Take 1 tablet by mouth daily.    . pioglitazone (ACTOS) 45 MG tablet Take 45 mg by mouth daily.     . pravastatin (PRAVACHOL) 40 MG tablet Take 40 mg by mouth daily.    Marland Kitchen tiZANidine (ZANAFLEX) 4 MG tablet Take 1 tablet (4 mg total) by mouth every 6 (six) hours as needed for muscle spasms. 40 tablet 0  . traMADol (ULTRAM) 50 MG tablet TAKE 1 TABLET BY MOUTH AS NEEDED  2  . UNABLE TO FIND Med Name: CPAP 10 cm    . alfuzosin (UROXATRAL) 10 MG 24 hr tablet Take 1 tablet (10 mg total) daily with breakfast by mouth. 90 tablet 3  . FARXIGA 10 MG TABS tablet Take 10 mg by mouth daily.  5  . metoprolol tartrate (LOPRESSOR) 25 MG tablet Take 0.5 tablets (12.5 mg total) by mouth 2 (two) times daily. 90 tablet 3   No facility-administered medications prior to visit.      Allergies:   Breo ellipta [fluticasone furoate-vilanterol]; Demerol [meperidine]; Lamotrigine; and Nsaids   Social History   Socioeconomic History  . Marital status: Married    Spouse name: Not on file  . Number of children: Not on file  . Years of education: college  . Highest education level: Not on file  Occupational History  . Occupation: Retired Equities trader: [employer]  Social Needs  . Financial resource strain: Not on file  . Food insecurity:    Worry: Not on file    Inability: Not on file  . Transportation needs:    Medical: Not on file    Non-medical:  Not on file  Tobacco Use  . Smoking status: Former Smoker    Packs/day: 1.50    Years: 20.00    Pack years: 30.00    Types: Cigarettes    Last attempt to quit: 04/17/1990    Years since quitting: 27.3  . Smokeless tobacco: Never  Used  Substance and Sexual Activity  . Alcohol use: No  . Drug use: No  . Sexual activity: Not on file  Lifestyle  . Physical activity:    Days per week: Not on file    Minutes per session: Not on file  . Stress: Not on file  Relationships  . Social connections:    Talks on phone: Not on file    Gets together: Not on file    Attends religious service: Not on file    Active member of club or organization: Not on file    Attends meetings of clubs or organizations: Not on file    Relationship status: Not on file  Other Topics Concern  . Not on file  Social History Narrative   Patient works for [employer]. Patient has masters degree. Patient is married.     Family History:  The patient's family history includes Anxiety disorder in his mother; Asthma in his maternal grandmother; Heart disease in his father.   ROS:   Please see the history of present illness.    ROS All other systems reviewed and are negative.   PHYSICAL EXAM:   VS:  BP 110/80 (BP Location: Left Arm, Patient Position: Sitting, Cuff Size: Large)   Pulse 63   Ht 6\' 2"  (1.88 m)   Wt 245 lb (111.1 kg)   BMI 31.46 kg/m      General: Alert, oriented x3, no distress, mildly obese Head: no evidence of trauma, PERRL, EOMI, no exophtalmos or lid lag, no myxedema, no xanthelasma; normal ears, nose and oropharynx Neck: normal jugular venous pulsations and no hepatojugular reflux; brisk carotid pulses without delay and no carotid bruits Chest: clear to auscultation, no signs of consolidation by percussion or palpation, normal fremitus, symmetrical and full respiratory excursions Cardiovascular: normal position and quality of the apical impulse, regular rhythm with occasional ectopy, normal first and  second heart sounds, 1/6 holosystolic apical murmur, no diastolic murmurs, rubs or gallops Abdomen: no tenderness or distention, no masses by palpation, no abnormal pulsatility or arterial bruits, normal bowel sounds, no hepatosplenomegaly Extremities: no clubbing, cyanosis or edema; 2+ radial, ulnar and brachial pulses bilaterally; 2+ right femoral, posterior tibial and dorsalis pedis pulses; 2+ left femoral, posterior tibial and dorsalis pedis pulses; no subclavian or femoral bruits Neurological: grossly nonfocal Psych: Normal mood and affect  Wt Readings from Last 3 Encounters:  08/03/17 245 lb (111.1 kg)  01/16/17 249 lb (112.9 kg)  11/23/16 254 lb (115.2 kg)      Studies/Labs Reviewed:   EKG:  EKG is ordered today.  It shows normal sinus rhythm with 2 monomorphic PVCs, right bundle branch block (old), QTC 462 ms  Recent Labs: 11/23/2016: Hemoglobin 12.6; Platelets 251.0  [facility] 11/21/2016 total cholesterol 123, HDL 48, LDL 55, triglycerides 102.  Creatinine is 0.9 and liver function tests are normal, potassium 4.8,  Hemoglobin 12.6. Lipid Panel    Component Value Date/Time   CHOL 218 (H) 06/29/2015 1929   TRIG 173 (H) 06/29/2015 1929   HDL 42 06/29/2015 1929   CHOLHDL 5.2 06/29/2015 1929   VLDL 35 06/29/2015 1929   LDLCALC 141 (H) 06/29/2015 1929   ASSESSMENT:    1. PVCs (premature ventricular contractions)   2. MVP (mitral valve prolapse)   3. OSA (obstructive sleep apnea)   4. Diabetes mellitus type 2 in obese ([place])   5. Mild obesity   6. Mixed hyperlipidemia      PLAN:  In order of problems listed  above:  1. PVCs: Unaware of the PVCs that are seen on ECG and her during his exam today.  Specific therapy is not necessary 2. MVP: Other than possibly contributing to his PVCs, this is asymptomatic and not likely to be of clinical importance in the near future.  His murmur is barely audible. 3. OSA: Reports compliance with CPAP and clinical benefit from his  treatment 4. DM: Excellent glycemic control  5. Obesity: He has had previous gastric bypass.  Remains mildly obese 6. HLP: Excellent parameters, continue statin   Medication Adjustments/Labs and Tests Ordered: Current medicines are reviewed at length with the patient today.  Concerns regarding medicines are outlined above.  Medication changes, Labs and Tests ordered today are listed in the Patient Instructions below. Patient Instructions  Dr Sallyanne Kuster recommends that you schedule a follow-up appointment in 12 months. You will receive a reminder letter in the mail two months in advance. If you don't receive a letter, please call our office to schedule the follow-up appointment.  If you need a refill on your cardiac medications before your next appointment, please call your pharmacy.    Signed, Sanda Klein, MD  08/05/2017 5:56 PM    [facility] [place], [place], [place]  [address] Phone: [phone]; Fax: [phone]

## 2017-08-05 DIAGNOSIS — E669 Obesity, unspecified: Secondary | ICD-10-CM | POA: Insufficient documentation

## 2017-08-05 DIAGNOSIS — E119 Type 2 diabetes mellitus without complications: Secondary | ICD-10-CM | POA: Insufficient documentation

## 2017-08-05 DIAGNOSIS — I341 Nonrheumatic mitral (valve) prolapse: Secondary | ICD-10-CM | POA: Insufficient documentation

## 2017-08-05 DIAGNOSIS — E1169 Type 2 diabetes mellitus with other specified complication: Secondary | ICD-10-CM | POA: Insufficient documentation

## 2017-08-25 DIAGNOSIS — M545 Low back pain: Secondary | ICD-10-CM | POA: Diagnosis not present

## 2017-09-18 DIAGNOSIS — F3130 Bipolar disorder, current episode depressed, mild or moderate severity, unspecified: Secondary | ICD-10-CM | POA: Diagnosis not present

## 2017-11-24 DIAGNOSIS — G894 Chronic pain syndrome: Secondary | ICD-10-CM | POA: Diagnosis not present

## 2017-11-24 DIAGNOSIS — Z79891 Long term (current) use of opiate analgesic: Secondary | ICD-10-CM | POA: Diagnosis not present

## 2017-12-20 DIAGNOSIS — M545 Low back pain: Secondary | ICD-10-CM | POA: Diagnosis not present

## 2017-12-20 DIAGNOSIS — Z23 Encounter for immunization: Secondary | ICD-10-CM | POA: Diagnosis not present

## 2018-02-02 DIAGNOSIS — E1169 Type 2 diabetes mellitus with other specified complication: Secondary | ICD-10-CM | POA: Diagnosis not present

## 2018-02-02 DIAGNOSIS — R351 Nocturia: Secondary | ICD-10-CM | POA: Diagnosis not present

## 2018-02-02 DIAGNOSIS — Z125 Encounter for screening for malignant neoplasm of prostate: Secondary | ICD-10-CM | POA: Diagnosis not present

## 2018-02-02 DIAGNOSIS — E7800 Pure hypercholesterolemia, unspecified: Secondary | ICD-10-CM | POA: Diagnosis not present

## 2018-02-02 DIAGNOSIS — N401 Enlarged prostate with lower urinary tract symptoms: Secondary | ICD-10-CM | POA: Diagnosis not present

## 2018-02-02 DIAGNOSIS — Z79899 Other long term (current) drug therapy: Secondary | ICD-10-CM | POA: Diagnosis not present

## 2018-02-22 DIAGNOSIS — M5136 Other intervertebral disc degeneration, lumbar region: Secondary | ICD-10-CM | POA: Diagnosis not present

## 2018-02-22 DIAGNOSIS — Z79891 Long term (current) use of opiate analgesic: Secondary | ICD-10-CM | POA: Diagnosis not present

## 2018-02-22 DIAGNOSIS — M25511 Pain in right shoulder: Secondary | ICD-10-CM | POA: Diagnosis not present

## 2018-02-22 DIAGNOSIS — M961 Postlaminectomy syndrome, not elsewhere classified: Secondary | ICD-10-CM | POA: Diagnosis not present

## 2018-02-22 DIAGNOSIS — Z79899 Other long term (current) drug therapy: Secondary | ICD-10-CM | POA: Diagnosis not present

## 2018-03-15 DIAGNOSIS — M25511 Pain in right shoulder: Secondary | ICD-10-CM | POA: Diagnosis not present

## 2018-05-21 ENCOUNTER — Ambulatory Visit: Payer: Medicare Other | Admitting: Physician Assistant

## 2018-06-01 DIAGNOSIS — F3130 Bipolar disorder, current episode depressed, mild or moderate severity, unspecified: Secondary | ICD-10-CM | POA: Diagnosis not present

## 2018-06-25 DIAGNOSIS — G894 Chronic pain syndrome: Secondary | ICD-10-CM | POA: Diagnosis not present

## 2018-06-25 DIAGNOSIS — M25519 Pain in unspecified shoulder: Secondary | ICD-10-CM | POA: Diagnosis not present

## 2018-07-03 DIAGNOSIS — M25511 Pain in right shoulder: Secondary | ICD-10-CM | POA: Diagnosis not present

## 2018-07-03 DIAGNOSIS — M7541 Impingement syndrome of right shoulder: Secondary | ICD-10-CM | POA: Diagnosis not present

## 2018-07-17 ENCOUNTER — Telehealth: Payer: Self-pay | Admitting: *Deleted

## 2018-07-17 NOTE — Telephone Encounter (Signed)
LMOM @ 3:50 pm, re: follow up appoint.in August or September.

## 2018-07-19 DIAGNOSIS — F3130 Bipolar disorder, current episode depressed, mild or moderate severity, unspecified: Secondary | ICD-10-CM | POA: Diagnosis not present

## 2018-08-03 DIAGNOSIS — E1169 Type 2 diabetes mellitus with other specified complication: Secondary | ICD-10-CM | POA: Diagnosis not present

## 2018-08-03 DIAGNOSIS — I700 Atherosclerosis of aorta: Secondary | ICD-10-CM | POA: Diagnosis not present

## 2018-08-03 DIAGNOSIS — Z0000 Encounter for general adult medical examination without abnormal findings: Secondary | ICD-10-CM | POA: Diagnosis not present

## 2018-08-03 DIAGNOSIS — E7800 Pure hypercholesterolemia, unspecified: Secondary | ICD-10-CM | POA: Diagnosis not present

## 2018-08-03 DIAGNOSIS — F3342 Major depressive disorder, recurrent, in full remission: Secondary | ICD-10-CM | POA: Diagnosis not present

## 2018-08-16 DIAGNOSIS — F3130 Bipolar disorder, current episode depressed, mild or moderate severity, unspecified: Secondary | ICD-10-CM | POA: Diagnosis not present

## 2018-08-22 ENCOUNTER — Other Ambulatory Visit: Payer: Self-pay | Admitting: Geriatric Medicine

## 2018-08-22 ENCOUNTER — Ambulatory Visit
Admission: RE | Admit: 2018-08-22 | Discharge: 2018-08-22 | Disposition: A | Discharge status: Home / Self Care | Payer: Medicare Other | Source: Ambulatory Visit | Attending: Geriatric Medicine | Admitting: Geriatric Medicine

## 2018-08-22 DIAGNOSIS — R918 Other nonspecific abnormal finding of lung field: Secondary | ICD-10-CM | POA: Diagnosis not present

## 2018-08-22 DIAGNOSIS — R0789 Other chest pain: Secondary | ICD-10-CM | POA: Diagnosis not present

## 2018-09-06 DIAGNOSIS — E1169 Type 2 diabetes mellitus with other specified complication: Secondary | ICD-10-CM | POA: Diagnosis not present

## 2018-09-06 DIAGNOSIS — Z79899 Other long term (current) drug therapy: Secondary | ICD-10-CM | POA: Diagnosis not present

## 2018-09-06 DIAGNOSIS — Z7984 Long term (current) use of oral hypoglycemic drugs: Secondary | ICD-10-CM | POA: Diagnosis not present

## 2018-09-06 DIAGNOSIS — M25511 Pain in right shoulder: Secondary | ICD-10-CM | POA: Diagnosis not present

## 2018-09-06 DIAGNOSIS — D649 Anemia, unspecified: Secondary | ICD-10-CM | POA: Diagnosis not present

## 2018-09-17 ENCOUNTER — Other Ambulatory Visit: Payer: Self-pay

## 2018-09-17 ENCOUNTER — Other Ambulatory Visit: Payer: Medicare Other

## 2018-09-17 DIAGNOSIS — Z20822 Contact with and (suspected) exposure to covid-19: Secondary | ICD-10-CM

## 2018-09-17 DIAGNOSIS — R6889 Other general symptoms and signs: Secondary | ICD-10-CM | POA: Diagnosis not present

## 2018-09-22 LAB — NOVEL CORONAVIRUS, NAA: SARS-CoV-2, NAA: NOT DETECTED

## 2018-09-25 DIAGNOSIS — J849 Interstitial pulmonary disease, unspecified: Secondary | ICD-10-CM | POA: Diagnosis not present

## 2018-10-02 DIAGNOSIS — M25511 Pain in right shoulder: Secondary | ICD-10-CM | POA: Diagnosis not present

## 2018-10-03 DIAGNOSIS — D3131 Benign neoplasm of right choroid: Secondary | ICD-10-CM | POA: Diagnosis not present

## 2018-10-03 DIAGNOSIS — E119 Type 2 diabetes mellitus without complications: Secondary | ICD-10-CM | POA: Diagnosis not present

## 2018-10-03 DIAGNOSIS — H5203 Hypermetropia, bilateral: Secondary | ICD-10-CM | POA: Diagnosis not present

## 2018-10-03 DIAGNOSIS — H524 Presbyopia: Secondary | ICD-10-CM | POA: Diagnosis not present

## 2018-10-03 DIAGNOSIS — D3132 Benign neoplasm of left choroid: Secondary | ICD-10-CM | POA: Diagnosis not present

## 2018-10-05 DIAGNOSIS — D509 Iron deficiency anemia, unspecified: Secondary | ICD-10-CM | POA: Diagnosis not present

## 2018-10-08 ENCOUNTER — Other Ambulatory Visit: Payer: Self-pay | Admitting: Geriatric Medicine

## 2018-10-08 DIAGNOSIS — J849 Interstitial pulmonary disease, unspecified: Secondary | ICD-10-CM

## 2018-10-11 DIAGNOSIS — M25511 Pain in right shoulder: Secondary | ICD-10-CM | POA: Diagnosis not present

## 2018-10-17 DIAGNOSIS — M75121 Complete rotator cuff tear or rupture of right shoulder, not specified as traumatic: Secondary | ICD-10-CM | POA: Diagnosis not present

## 2018-10-17 DIAGNOSIS — M25511 Pain in right shoulder: Secondary | ICD-10-CM | POA: Diagnosis not present

## 2018-10-18 ENCOUNTER — Ambulatory Visit
Admission: RE | Admit: 2018-10-18 | Discharge: 2018-10-18 | Disposition: A | Discharge status: Home / Self Care | Payer: Medicare Other | Source: Ambulatory Visit | Attending: Geriatric Medicine | Admitting: Geriatric Medicine

## 2018-10-18 ENCOUNTER — Other Ambulatory Visit: Payer: Self-pay

## 2018-10-18 DIAGNOSIS — J8410 Pulmonary fibrosis, unspecified: Secondary | ICD-10-CM | POA: Diagnosis not present

## 2018-10-18 DIAGNOSIS — J479 Bronchiectasis, uncomplicated: Secondary | ICD-10-CM | POA: Diagnosis not present

## 2018-10-18 DIAGNOSIS — J849 Interstitial pulmonary disease, unspecified: Secondary | ICD-10-CM

## 2018-10-31 DIAGNOSIS — Z79891 Long term (current) use of opiate analgesic: Secondary | ICD-10-CM | POA: Diagnosis not present

## 2018-10-31 DIAGNOSIS — M25511 Pain in right shoulder: Secondary | ICD-10-CM | POA: Diagnosis not present

## 2018-10-31 DIAGNOSIS — M961 Postlaminectomy syndrome, not elsewhere classified: Secondary | ICD-10-CM | POA: Diagnosis not present

## 2018-11-07 DIAGNOSIS — M25511 Pain in right shoulder: Secondary | ICD-10-CM | POA: Diagnosis not present

## 2018-11-08 ENCOUNTER — Telehealth: Payer: Self-pay | Admitting: *Deleted

## 2018-11-08 NOTE — Telephone Encounter (Signed)
Virtual Visit Pre-Appointment Phone Call  "(Name), I am calling you today to discuss your upcoming appointment. We are currently trying to limit exposure to the virus that causes COVID-19 by seeing patients at home rather than in the office."  1. "What is the BEST phone number to call the day of the visit?" - include this in appointment notes  2. "Do you have or have access to (through a family member/friend) a smartphone with video capability that we can use for your visit?" a. If yes - list this number in appt notes as "cell" (if different from BEST phone #) and list the appointment type as a VIDEO visit in appointment notes b. If no - list the appointment type as a PHONE visit in appointment notes  3. Confirm consent - "In the setting of the current Covid19 crisis, you are scheduled for a (phone or video) visit with your provider on (date) at (time).  Just as we do with many in-office visits, in order for you to participate in this visit, we must obtain consent.  If you'd like, I can send this to your mychart (if signed up) or email for you to review.  Otherwise, I can obtain your verbal consent now.  All virtual visits are billed to your insurance company just like a normal visit would be.  By agreeing to a virtual visit, we'd like you to understand that the technology does not allow for your provider to perform an examination, and thus may limit your provider's ability to fully assess your condition. If your provider identifies any concerns that need to be evaluated in person, we will make arrangements to do so.  Finally, though the technology is pretty good, we cannot assure that it will always work on either your or our end, and in the setting of a video visit, we may have to convert it to a phone-only visit.  In either situation, we cannot ensure that we have a secure connection.  Are you willing to proceed?" Yes  4. Advise patient to be prepared - "Two hours prior to your appointment, go  ahead and check your blood pressure, pulse, oxygen saturation, and your weight (if you have the equipment to check those) and write them all down. When your visit starts, your provider will ask you for this information. If you have an Apple Watch or Kardia device, please plan to have heart rate information ready on the day of your appointment. Please have a pen and paper handy nearby the day of the visit as well."  5. Give patient instructions for MyChart download to smartphone OR Doximity/Doxy.me as below if video visit (depending on what platform provider is using)  6. Inform patient they will receive a phone call 15 minutes prior to their appointment time (may be from unknown caller ID) so they should be prepared to answer    [place] Bencomo Jr. has been deemed a candidate for a follow-up tele-health visit to limit community exposure during the Covid-19 pandemic. I spoke with the patient via phone to ensure availability of phone/video source, confirm preferred email & phone number, and discuss instructions and expectations.  I reminded Norva Pavlov. to be prepared with any vital sign and/or heart rhythm information that could potentially be obtained via home monitoring, at the time of his visit. I reminded Norva Pavlov. to expect a phone call prior to his visit.  Ricci Barker, RN 11/08/2018 11:33 AM  INSTRUCTIONS FOR DOWNLOADING THE MYCHART APP TO SMARTPHONE  - The patient must first make sure to have activated MyChart and know their login information - If Apple, go to [employer] and type in MyChart in the search bar and download the app. If Android, ask patient to go to [employer] and type in [place] in the search bar and download the app. The app is free but as with any other app downloads, their phone may require them to verify saved payment information or Apple/Android password.  - The patient will need to then log into the app with their  MyChart username and password, and select [facility] as their healthcare provider to link the account. When it is time for your visit, go to the MyChart app, find appointments, and click Begin Video Visit. Be sure to Select Allow for your device to access the Microphone and Camera for your visit. You will then be connected, and your provider will be with you shortly.  **If they have any issues connecting, or need assistance please contact MyChart service desk [phone])**  **If using a computer, in order to ensure the best quality for their visit they will need to use either of the following Internet Browsers: [employer], or Google Chrome**  IF USING DOXIMITY or DOXY.ME - The patient will receive a link just prior to their visit by text.     FULL LENGTH CONSENT FOR TELE-HEALTH VISIT   I hereby voluntarily request, consent and authorize [facility] and its employed or contracted physicians, physician assistants, nurse practitioners or other licensed health care professionals (the Practitioner), to provide me with telemedicine health care services (the "Services") as deemed necessary by the treating Practitioner. I acknowledge and consent to receive the Services by the Practitioner via telemedicine. I understand that the telemedicine visit will involve communicating with the Practitioner through live audiovisual communication technology and the disclosure of certain medical information by electronic transmission. I acknowledge that I have been given the opportunity to request an in-person assessment or other available alternative prior to the telemedicine visit and am voluntarily participating in the telemedicine visit.  I understand that I have the right to withhold or withdraw my consent to the use of telemedicine in the course of my care at any time, without affecting my right to future care or treatment, and that the Practitioner or I may terminate the telemedicine visit at  any time. I understand that I have the right to inspect all information obtained and/or recorded in the course of the telemedicine visit and may receive copies of available information for a reasonable fee.  I understand that some of the potential risks of receiving the Services via telemedicine include:  Marland Kitchen Delay or interruption in medical evaluation due to technological equipment failure or disruption; . Information transmitted may not be sufficient (e.g. poor resolution of images) to allow for appropriate medical decision making by the Practitioner; and/or  . In rare instances, security protocols could fail, causing a breach of personal health information.  Furthermore, I acknowledge that it is my responsibility to provide information about my medical history, conditions and care that is complete and accurate to the best of my ability. I acknowledge that Practitioner's advice, recommendations, and/or decision may be based on factors not within their control, such as incomplete or inaccurate data provided by me or distortions of diagnostic images or specimens that may result from electronic transmissions. I understand that the practice of medicine is not an exact science  and that Practitioner makes no warranties or guarantees regarding treatment outcomes. I acknowledge that I will receive a copy of this consent concurrently upon execution via email to the email address I last provided but may also request a printed copy by calling the office of [facility].    I understand that my insurance will be billed for this visit.   I have read or had this consent read to me. . I understand the contents of this consent, which adequately explains the benefits and risks of the Services being provided via telemedicine.  . I have been provided ample opportunity to ask questions regarding this consent and the Services and have had my questions answered to my satisfaction. . I give my informed consent for the  services to be provided through the use of telemedicine in my medical care  By participating in this telemedicine visit I agree to the above.

## 2018-11-09 ENCOUNTER — Telehealth (INDEPENDENT_AMBULATORY_CARE_PROVIDER_SITE_OTHER): Payer: Medicare Other | Admitting: Cardiovascular Disease

## 2018-11-09 ENCOUNTER — Encounter: Payer: Self-pay | Admitting: Cardiovascular Disease

## 2018-11-09 DIAGNOSIS — E1169 Type 2 diabetes mellitus with other specified complication: Secondary | ICD-10-CM | POA: Diagnosis not present

## 2018-11-09 DIAGNOSIS — E782 Mixed hyperlipidemia: Secondary | ICD-10-CM | POA: Diagnosis not present

## 2018-11-09 DIAGNOSIS — I2510 Atherosclerotic heart disease of native coronary artery without angina pectoris: Secondary | ICD-10-CM | POA: Diagnosis not present

## 2018-11-09 DIAGNOSIS — G4733 Obstructive sleep apnea (adult) (pediatric): Secondary | ICD-10-CM | POA: Diagnosis not present

## 2018-11-09 DIAGNOSIS — Z9989 Dependence on other enabling machines and devices: Secondary | ICD-10-CM | POA: Diagnosis not present

## 2018-11-09 DIAGNOSIS — I341 Nonrheumatic mitral (valve) prolapse: Secondary | ICD-10-CM

## 2018-11-09 DIAGNOSIS — I340 Nonrheumatic mitral (valve) insufficiency: Secondary | ICD-10-CM

## 2018-11-09 DIAGNOSIS — E669 Obesity, unspecified: Secondary | ICD-10-CM | POA: Diagnosis not present

## 2018-11-09 DIAGNOSIS — I517 Cardiomegaly: Secondary | ICD-10-CM

## 2018-11-09 NOTE — Progress Notes (Signed)
Virtual Visit via Video Note   This visit type was conducted due to national recommendations for restrictions regarding the COVID-19 Pandemic (e.g. social distancing) in an effort to limit this patient's exposure and mitigate transmission in our community.  Due to his co-morbid illnesses, this patient is at least at moderate risk for complications without adequate follow up.  This format is felt to be most appropriate for this patient at this time.  All issues noted in this document were discussed and addressed.  A limited physical exam was performed with this format.  Please refer to the patient's chart for his consent to telehealth for [facility].   Date:  11/09/2018   ID:  [name]., DOB [birth year], MRN [id number]  Patient Location: Home Provider Location: Home  PCP:  Lajean Manes, MD  Cardiologist:  Lovey Crupi Electrophysiologist:  None   Evaluation Performed:  Follow-Up Visit  Chief Complaint:  Abnormal findings on chest CT  History of Present Illness:    Rilan Honeck. is a 73 y.o. male with known moderate CAD managed medically, mitral valve prolapse of the anterior leaflet with mild to moderate mitral insufficiency, diabetes mellitus, hyperlipidemia, symptomatic PVCs, OSA on CPAP.  From a symptom point of view he is doing quite well, but he has a lot of anxiety surrounding his chronic medical problems, coronavirus pandemic and the findings on recent chest CT.  The patient specifically denies any chest pain at rest exertion, dyspnea at rest or with exertion, orthopnea, paroxysmal nocturnal dyspnea, syncope, palpitations, focal neurological deficits, intermittent claudication, lower extremity edema, unexplained weight gain, cough, hemoptysis or wheezing.  He had a mechanical fall complicated by fractures in his left fifth and sixth ribs close to the sternum.  He also has severe rotator cuff injury to the shoulder and might need a reverse shoulder prosthesis.  The  chest x-ray performed when he had a fall showed underlying lung scarring, which led to a chest CT.  Retrospectively, he has similar findings of chronic lung fibrosis dating back at least 2015.  The chest CT also showed three-vessel coronary atherosclerosis, aortic atherosclerosis" mild dilation of the pulmonary artery at 3.3 cm".  He was worried that these findings meant that he has had progression of his cardiac problems.  Cardiac catheterization in 2008 showed a 95% stenosis in a small third diagonal artery branch, with mild plaque elsewhere.  He had a low risk nuclear stress test in April 2017.  Echo in 2017 showed LVEF 65-70% with mild mitral regurgitation.  He does not have congestive heart failure.  The patient does not have symptoms concerning for COVID-19 infection (fever, chills, cough, or new shortness of breath).    Past Medical History:  Diagnosis Date  . Arthritis   . Asthma    anxiety, tree/grass pollen  . BPH (benign prostatic hyperplasia)   . Bronchitis    hx of  . Bulge of cervical disc without myelopathy   . Chest pain    a. 06/2015 MV: EF 56%, no ischemia/infarct.  . Complication of anesthesia [id number]   no complications but just says he typically needs "more than expected" to anesthetize; needs CPAP (setting 10) in recovery  . Depression    ADD  . Diabetes mellitus    PCP [name], type 2  . Insomnia   . Mitral valve prolapse    a. 06/2015 Echo: EF 65-70%, mild LVH, no rwma, mild MVP of ant leaflet and mild to mod posteriorly directed MR, mildly  dil La.  . Obesity   . PVC's (premature ventricular contractions)   . Recurrent upper respiratory infection (URI)    states Dr Wynelle Link aware- no fever- states is improving  . Sleep apnea    settings 10.6  / followed by [name] study years ago  . Tremor    d/t lithium   Past Surgical History:  Procedure Laterality Date  . CARDIAC CATHETERIZATION     2008 pre op gastric bypass  . COLONOSCOPY W/ POLYPECTOMY    .  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 JOINT REPLACEMENT     Left, Right Knee, Right hip  . KNEE ARTHROSCOPY     right x 3-4, left x 2  . KNEE ARTHROTOMY  ~1991   right  . LUMBAR FUSION  04/03/2013   [name], L5-S1  . POLYPECTOMY     vocal cords 1992  . 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   Procedure: LEFT TOTAL HIP ARTHROPLASTY ANTERIOR APPROACH;  Surgeon: [name], MD;  Location: [facility];  Service: Orthopedics;  Laterality: Left;  . TOTAL HIP REVISION  04/22/2011   Procedure: TOTAL HIP REVISION;  Surgeon: Gearlean Alf, MD;  Location: [facility];  Service: Orthopedics;  Laterality: Right;     Current Meds  Medication Sig  . acetaminophen (TYLENOL) 500 MG tablet Take 1,000 mg by mouth daily as needed for headache.  . albuterol (PROVENTIL HFA;VENTOLIN HFA) 108 (90 Base) MCG/ACT inhaler Inhale 2 puffs into the lungs every 6 (six) hours as needed for wheezing or shortness of breath.  . ALPRAZolam (XANAX) 0.5 MG tablet Take 0.5-1 mg by mouth 3 (three) times daily as needed for anxiety. Reported on 05/19/2015  . aspirin EC 81 MG tablet Take 81 mg by mouth daily as needed for mild pain.  . DULoxetine (CYMBALTA) 60 MG capsule Take 2 capsules by mouth daily.  Marland Kitchen dutasteride (AVODART) 0.5 MG capsule Take 0.5 mg by mouth daily.  . fluticasone (FLONASE) 50 MCG/ACT nasal spray Place 2 sprays into the nose 2 (two) times daily.  Marland Kitchen HYDROcodone-acetaminophen (NORCO) 10-325 MG per tablet Take 1-2 tablets by mouth every 6 (six) hours as needed for moderate pain. (Patient taking differently: Take 1 tablet by mouth as needed for moderate pain. )  . metFORMIN (GLUCOPHAGE) 500 MG tablet Take 1,000 mg by mouth 2 (two) times daily with a meal.  . Multiple Vitamins-Minerals (CENTRUM SILVER ADULT 50+ PO) Take 1 tablet by mouth daily.  . pioglitazone (ACTOS) 45 MG tablet Take 45 mg by mouth daily.   . pravastatin (PRAVACHOL) 40 MG tablet Take 40 mg by mouth daily.  Marland Kitchen tiZANidine (ZANAFLEX) 4 MG tablet Take 1 tablet  (4 mg total) by mouth every 6 (six) hours as needed for muscle spasms.  . traMADol (ULTRAM) 50 MG tablet TAKE 1 TABLET BY MOUTH AS NEEDED     Allergies:   Breo ellipta [fluticasone furoate-vilanterol], Demerol [meperidine], Lamotrigine, and Nsaids   Social History   Tobacco Use  . Smoking status: Former Smoker    Packs/day: 1.50    Years: 20.00    Pack years: 30.00    Types: Cigarettes    Quit date: 04/17/1990    Years since quitting: 28.5  . Smokeless tobacco: Never Used  Substance Use Topics  . Alcohol use: No  . Drug use: No     Family Hx: The patient's family history includes Anxiety disorder in his mother; Asthma in his maternal grandmother; Heart disease in  his father.  ROS:   Please see the history of present illness.    All other systems reviewed and are negative.  Prior CV studies:   The following studies were reviewed today: High resolution chest CT August 2020 Coronary angiogram 2008, nuclear stress test 2017, echocardiogram 2017  Labs/Other Tests and Data Reviewed:    EKG:  No new ECG tracing since May 2019  Recent Labs: LABS from 09/06/2018  Creatinine 0.86, hemoglobin A1c 6.4%, glucose 108, hemoglobin 11.9, potassium 4.7, ferritin 12, normal liver function tests  LABS from 02/02/2018 Total cholesterol 141, HDL 43, LDL 73, triglycerides 125  Recent Lipid Panel Lab Results  Component Value Date/Time   CHOL 218 (H) 06/29/2015 07:29 PM   TRIG 173 (H) 06/29/2015 07:29 PM   HDL 42 06/29/2015 07:29 PM   CHOLHDL 5.2 06/29/2015 07:29 PM   LDLCALC 141 (H) 06/29/2015 07:29 PM    Wt Readings from Last 3 Encounters:  11/09/18 223 lb (101.2 kg)  08/03/17 245 lb (111.1 kg)  01/16/17 249 lb (112.9 kg)     Objective:    Vital Signs:  BP 131/73   Pulse 67   Ht 6\' 2"  (1.88 m)   Wt 223 lb (101.2 kg)   BMI 28.63 kg/m    VITAL SIGNS:  reviewed GEN:  no acute distress EYES:  sclerae anicteric, EOMI - Extraocular Movements Intact RESPIRATORY:  normal  respiratory effort, symmetric expansion CARDIOVASCULAR:  no peripheral edema SKIN:  no rash, lesions or ulcers. MUSCULOSKELETAL:  no obvious deformities. NEURO:  alert and oriented x 3, no obvious focal deficit PSYCH:  normal affect  ASSESSMENT & PLAN:    1. CAD: I assured Mikki Santee that the three-vessel coronary arthrosclerosis finding is not new.  Although he was quoted the presence of a high-grade stenosis and only 1 vessel, he clearly had plaque in all 3 vessels as far back as 2008.  Reviewed the fact that while he is asymptomatic, the focus remains on risk factor modification (LDL cholesterol less than 70, hemoglobin A1c less than 7%, good blood pressure control, healthy diet and physical exercise). 2. Aortic atherosclerosis: No evidence of aortic aneurysm on chest CT angiography.  The focus is again on risk factor correction. 3. 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  Any degree of pulmonary dilation is likely related to his OSA and maybe the chronic lung changes, not due to cardiovascular illness. 4. Pulmonary fibrosis: This should be addressed by a specialist, but as far as I can tell the findings are likely to be chronic and they are asymptomatic.  Note that in 2018 he had pulmonary spirometry which showed FVC of 97% of predicted and FEV1 greater than 100% of predicted.  He does not have evidence of right heart enlargement or symptoms of right heart failure. 5. MVP/MR: She does not have symptoms that would suggest progression of his mitral regurgitation.  At this point repeat echocardiogram is not eminently necessary and he really does not want to leave the house due to the coronavirus pandemic. 6. PVCs: These have not bothered him recently. 7. OSA: He reports 100% compliance with CPAP and does not have daytime hypersomnolence. 8. DM: His most recent hemoglobin A1c was 6.4%. 9. HLP: I do not have his most recent  labs, but in 2018 his LDL cholesterol was excellent at 55.  In November 2019 LDL was 73.  He tells me that Dr. Felipa Eth  actually reduced his dose of atorvastatin from 80 mg to 40 mg recently, because his cholesterol level was so good. 10. Overweight: He has made progress with weight loss and is no longer obese.    COVID-19 Education: The signs and symptoms of COVID-19 were discussed with the patient and how to seek care for testing (follow up with PCP or arrange E-visit).  The importance of social distancing was discussed today.  Time:   Today, I have spent 21 minutes with the patient with telehealth technology discussing the above problems.     Medication Adjustments/Labs and Tests Ordered: Current medicines are reviewed at length with the patient today.  Concerns regarding medicines are outlined above.   Tests Ordered: No orders of the defined types were placed in this encounter.   Medication Changes: No orders of the defined types were placed in this encounter.   Follow Up:  Virtual Visit or In Person 1 year  Signed, Sanda Klein, MD  11/09/2018 1:20 PM    [facility]

## 2018-11-09 NOTE — Patient Instructions (Signed)

## 2018-11-27 ENCOUNTER — Encounter: Payer: Self-pay | Admitting: Pulmonary Disease

## 2018-11-27 ENCOUNTER — Ambulatory Visit (INDEPENDENT_AMBULATORY_CARE_PROVIDER_SITE_OTHER): Payer: Medicare Other | Admitting: Pulmonary Disease

## 2018-11-27 ENCOUNTER — Other Ambulatory Visit: Payer: Self-pay

## 2018-11-27 VITALS — BP 116/72 | HR 78 | Temp 96.7°F | Ht 74.0 in | Wt 217.2 lb

## 2018-11-27 DIAGNOSIS — G4733 Obstructive sleep apnea (adult) (pediatric): Secondary | ICD-10-CM | POA: Diagnosis not present

## 2018-11-27 DIAGNOSIS — J849 Interstitial pulmonary disease, unspecified: Secondary | ICD-10-CM | POA: Insufficient documentation

## 2018-11-27 DIAGNOSIS — Z9989 Dependence on other enabling machines and devices: Secondary | ICD-10-CM | POA: Diagnosis not present

## 2018-11-27 DIAGNOSIS — J45991 Cough variant asthma: Secondary | ICD-10-CM | POA: Diagnosis not present

## 2018-11-27 DIAGNOSIS — R918 Other nonspecific abnormal finding of lung field: Secondary | ICD-10-CM | POA: Insufficient documentation

## 2018-11-27 NOTE — Patient Instructions (Addendum)
You were seen today by Lauraine Rinne, NP  for:   1. OSA on CPAP  New CPAP order DME: [facility] Same settings Keep humidification Supplies Mask of choice  We recommend that you continue using your CPAP daily >>>Keep up the hard work using your device >>> Goal should be wearing this for the entire night that you are sleeping, at least 4 to 6 hours  Remember:  . Do not drive or operate heavy machinery if tired or drowsy.  . Please notify the supply company and office if you are unable to use your device regularly due to missing supplies or machine being broken.  . Work on maintaining a healthy weight and following your recommended nutrition plan  . Maintain proper daily exercise and movement  . Maintaining proper use of your device can also help improve management of other chronic illnesses such as: Blood pressure, blood sugars, and weight management.   BiPAP/ CPAP Cleaning:  >>>Clean weekly, with Dawn soap, and bottle brush.  Set up to air dry.  1 year follow-up with Dr. Elsworth Soho  2. Cough variant asthma  We would like for you to follow-up with Dr. Melvyn Novas in the next 2 to 4 weeks as you have not been seen by him in quite some time.  3. Abnormal findings on diagnostic imaging of lung  Recent high-resolution CT chest showing interstitial lung disease. We will plan to repeat CT chest in 1 year.  Follow-up with Dr. Melvyn Novas in 2 to 4 weeks to further evaluate and discuss  Follow Up:    Return in about 2 weeks (around 12/11/2018), or if symptoms worsen or fail to improve, for Follow up with Dr. Melvyn Novas.   Please do your part to reduce the spread of COVID-19:      Reduce your risk of any infection  and COVID19 by using the similar precautions used for avoiding the common cold or flu:  Marland Kitchen Wash your hands often with soap and warm water for at least 20 seconds.  If soap and water are not readily available, use an alcohol-based hand sanitizer with at least 60% alcohol.  . If coughing or sneezing,  cover your mouth and nose by coughing or sneezing into the elbow areas of your shirt or coat, into a tissue or into your sleeve (not your hands). Langley Gauss A MASK when in public  . Avoid shaking hands with others and consider head nods or verbal greetings only. . Avoid touching your eyes, nose, or mouth with unwashed hands.  . Avoid close contact with people who are sick. . Avoid places or events with large numbers of people in one location, like concerts or sporting events. . If you have some symptoms but not all symptoms, continue to monitor at home and seek medical attention if your symptoms worsen. . If you are having a medical emergency, call 911.   [place] / e-Visit: [email or web address]         [facility]: [facility]: [id number]                   [facility]: [id number]     It is flu season:   >>> Best ways to protect herself from the flu: Receive the yearly flu vaccine, practice good hand hygiene washing with soap and also using hand sanitizer when available, eat a nutritious meals, get adequate rest, hydrate appropriately   Please contact  the office if your symptoms worsen or you have concerns that you are not improving.   Thank you for choosing [facility] for your healthcare, and for allowing [place] to partner with you on your healthcare journey. I am thankful to be able to provide care to you today.   Wyn Quaker FNP-C    Living With Sleep Apnea Sleep apnea is a condition in which breathing pauses or becomes shallow during sleep. Sleep apnea is most commonly caused by a collapsed or blocked airway. People with sleep apnea snore loudly and have times when they gasp and stop breathing for 10 seconds or more during sleep. This happens over and over during the night. This disrupts your sleep and keeps your body  from getting the rest that it needs, which can cause tiredness and lack of energy (fatigue) during the day. The breaks in breathing also interrupt the deep sleep that you need to feel rested. Even if you do not completely wake up from the gaps in breathing, your sleep may not be restful. You may also have a headache in the morning and low energy during the day, and you may feel anxious or depressed. How can sleep apnea affect me? Sleep apnea increases your chances of extreme tiredness during the day (daytime fatigue). It can also increase your risk for health conditions, such as:  Heart attack.  Stroke.  Diabetes.  Heart failure.  Irregular heartbeat.  High blood pressure. If you have daytime fatigue as a result of sleep apnea, you may be more likely to:  Perform poorly at school or work.  Fall asleep while driving.  Have difficulty with attention.  Develop depression or anxiety.  Become severely overweight (obese).  Have sexual dysfunction. What actions can I take to manage sleep apnea? Sleep apnea treatment   If you were given a device to open your airway while you sleep, use it only as told by your health care provider. You may be given: ? An oral appliance. This is a custom-made mouthpiece that shifts your lower jaw forward. ? A continuous positive airway pressure (CPAP) device. This device blows air through a mask when you breathe out (exhale). ? A nasal expiratory positive airway pressure (EPAP) device. This device has valves that you put into each nostril. ? A bi-level positive airway pressure (BPAP) device. This device blows air through a mask when you breathe in (inhale) and breathe out (exhale).  You may need surgery if other treatments do not work for you. Sleep habits  Go to sleep and wake up at the same time every day. This helps set your internal clock (circadian rhythm) for sleeping. ? If you stay up later than usual, such as on weekends, try to get up in the  morning within 2 hours of your normal wake time.  Try to get at least 7-9 hours of sleep each night.  Stop computer, tablet, and mobile phone use a few hours before bedtime.  Do not take long naps during the day. If you nap, limit it to 30 minutes.  Have a relaxing bedtime routine. Reading or listening to music may relax you and help you sleep.  Use your bedroom only for sleep. ? Keep your television and computer out of your bedroom. ? Keep your bedroom cool, dark, and quiet. ? Use a supportive mattress and pillows.  Follow your health care provider's instructions for other changes to sleep habits. Nutrition  Do not eat heavy meals in the evening.  Do  not have caffeine in the later part of the day. The effects of caffeine can last for more than 5 hours.  Follow your health care provider's or dietitian's instructions for any diet changes. Lifestyle      Do not drink alcohol before bedtime. Alcohol can cause you to fall asleep at first, but then it can cause you to wake up in the middle of the night and have trouble getting back to sleep.  Do not use any products that contain nicotine or tobacco, such as cigarettes and e-cigarettes. If you need help quitting, ask your health care provider. Medicines  Take over-the-counter and prescription medicines only as told by your health care provider.  Do not use over-the-counter sleep medicine. You can become dependent on this medicine, and it can make sleep apnea worse.  Do not use medicines, such as sedatives and narcotics, unless told by your health care provider. Activity  Exercise on most days, but avoid exercising in the evening. Exercising near bedtime can interfere with sleeping.  If possible, spend time outside every day. Natural light helps regulate your circadian rhythm. General information  Lose weight if you need to, and maintain a healthy weight.  Keep all follow-up visits as told by your health care provider. This is  important.  If you are having surgery, make sure to tell your health care provider that you have sleep apnea. You may need to bring your device with you. Where to find more information Learn more about sleep apnea and daytime fatigue from:  American Sleep Association: sleepassociation.[place]: sleepfoundation.org  National Heart, Lung, and Blood Institute: https://www.[place].biz/ Summary  Sleep apnea can cause daytime fatigue and other serious health conditions.  Both sleep apnea and daytime fatigue can be bad for your health and well-being.  You may need to wear a device while sleeping to help keep your airway open.  If you are having surgery, make sure to tell your health care provider that you have sleep apnea. You may need to bring your device with you.  Making changes to sleep habits, diet, lifestyle, and activity can help you manage sleep apnea. This information is not intended to replace advice given to you by your health care provider. Make sure you discuss any questions you have with your health care provider. Document Released: 05/25/2017 Document Revised: 06/22/2018 Document Reviewed: 05/25/2017 Elsevier Patient Education  [place].    CPAP and BPAP Information CPAP and BPAP are methods of helping a person breathe with the use of air pressure. CPAP stands for "continuous positive airway pressure." BPAP stands for "bi-level positive airway pressure." In both methods, air is blown through your nose or mouth and into your air passages to help you breathe well. CPAP and BPAP use different amounts of pressure to blow air. With CPAP, the amount of pressure stays the same while you breathe in and out. With BPAP, the amount of pressure is increased when you breathe in (inhale) so that you can take larger breaths. Your health care provider will recommend whether CPAP or BPAP would be more helpful for you. Why are CPAP and BPAP treatments used? CPAP or BPAP can  be helpful if you have:  Sleep apnea.  Chronic obstructive pulmonary disease (COPD).  Heart failure.  Medical conditions that weaken the muscles of the chest including muscular dystrophy, or neurological diseases such as amyotrophic lateral sclerosis (ALS).  Other problems that cause breathing to be weak, abnormal, or difficult. CPAP is most commonly  used for obstructive sleep apnea (OSA) to keep the airways from collapsing when the muscles relax during sleep. How is CPAP or BPAP administered? Both CPAP and BPAP are provided by a small machine with a flexible plastic tube that attaches to a plastic mask. You wear the mask. Air is blown through the mask into your nose or mouth. The amount of pressure that is used to blow the air can be adjusted on the machine. Your health care provider will determine the pressure setting that should be used based on your individual needs. When should CPAP or BPAP be used? In most cases, the mask only needs to be worn during sleep. Generally, the mask needs to be worn throughout the night and during any daytime naps. People with certain medical conditions may also need to wear the mask at other times when they are awake. Follow instructions from your health care provider about when to use the machine. What are some tips for using the mask?   Because the mask needs to be snug, some people feel trapped or closed-in (claustrophobic) when first using the mask. If you feel this way, you may need to get used to the mask. One way to do this is by holding the mask loosely over your nose or mouth and then gradually applying the mask more snugly. You can also gradually increase the amount of time that you use the mask.  Masks are available in various types and sizes. Some fit over your mouth and nose while others fit over just your nose. If your mask does not fit well, talk with your health care provider about getting a different one.  If you are using a mask that fits  over your nose and you tend to breathe through your mouth, a chin strap may be applied to help keep your mouth closed.  The CPAP and BPAP machines have alarms that may sound if the mask comes off or develops a leak.  If you have trouble with the mask, it is very important that you talk with your health care provider about finding a way to make the mask easier to tolerate. Do not stop using the mask. Stopping the use of the mask could have a negative impact on your health. What are some tips for using the machine?  Place your CPAP or BPAP machine on a secure table or stand near an electrical outlet.  Know where the on/off switch is located on the machine.  Follow instructions from your health care provider about how to set the pressure on your machine and when you should use it.  Do not eat or drink while the CPAP or BPAP machine is on. Food or fluids could get pushed into your lungs by the pressure of the CPAP or BPAP.  Do not smoke. Tobacco smoke residue can damage the machine.  For home use, CPAP and BPAP machines can be rented or purchased through home health care companies. Many different brands of machines are available. Renting a machine before purchasing may help you find out which particular machine works well for you.  Keep the CPAP or BPAP machine and attachments clean. Ask your health care provider for specific instructions. Get help right away if:  You have redness or open areas around your nose or mouth where the mask fits.  You have trouble using the CPAP or BPAP machine.  You cannot tolerate wearing the CPAP or BPAP mask.  You have pain, discomfort, and bloating in your abdomen. Summary  CPAP and BPAP are methods of helping a person breathe with the use of air pressure.  Both CPAP and BPAP are provided by a small machine with a flexible plastic tube that attaches to a plastic mask.  If you have trouble with the mask, it is very important that you talk with your  health care provider about finding a way to make the mask easier to tolerate. This information is not intended to replace advice given to you by your health care provider. Make sure you discuss any questions you have with your health care provider. Document Released: 11/27/2003 Document Revised: 06/20/2018 Document Reviewed: 01/18/2016 Elsevier Patient Education  2020 [employer].

## 2018-11-27 NOTE — Assessment & Plan Note (Signed)
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

## 2018-11-27 NOTE — Assessment & Plan Note (Signed)
Plan: We will order pulmonary function testing Return to office for follow-up to see Dr. Melvyn Novas in 2 to 4 weeks as he has not been seen since 2018

## 2018-11-27 NOTE — Assessment & Plan Note (Signed)
Plan: Continue CPAP We will order new CPAP today Follow-up in 1 year with Dr. Elsworth Soho

## 2018-11-27 NOTE — Addendum Note (Signed)
Addended by: Valerie Salts on: 11/27/2018 02:45 PM   Modules accepted: Orders

## 2018-11-27 NOTE — Progress Notes (Signed)
@Patient  ID: [name]., male    DOB: 15-Jul-1945, 73 y.o.   MRN: [id number]  Chief Complaint  Patient presents with  . Follow-up    Per patient, his CPAP no longer works. Received the message "motor has exceeded its lifespan". Uses [facility] as his DME.     Referring provider: Lajean Manes, MD  HPI:  73 year old male .  Former smoker followed in our office for cough variant asthma by Dr. Melvyn Novas as well as severe obstructive sleep apnea by Dr. Elsworth Soho  PMH: Chest pain, type 2 diabetes, BPH, obesity Smoker/ Smoking History: Former smoker.  Quit 1992.  30-pack-year smoking Maintenance: None Pt of: [facility] for pulm, [place] for sleep   Of note: Patient has previous environmental exposures to work in [employer], educational, sugar, barley, multiple environmental exposures over the course of his employment.  11/27/2018  - Visit   73 year old male former smoker followed in our office for severe obstructive sleep apnea as well as upper airway cough syndrome and cough variant asthma.  This appointment today was made because patient reported that his CPAP is starting to not work properly.  He got a warning from his CPAP stating that the motor was starting to breakdown 2 days ago.  He has had the CPAP for over 5 years.  He reports that he tried to obtain a new [facility]AP through [facility] but they told him that he would need to have a follow-up with our office.  Patient was last seen in our office for sleep management in 2017.  Patient was last seen in our office for pulmonary concerns in 2018.  Patient would like a new CPAP today.  CPAP compliance report today shows excellent compliance.  08/29/2018-11/26/2018 20-90 of the last 90 days used, 89 of those days greater than 4 hours, average usage 7 hours and 40 minutes, APAP setting 8-12.4, 95th percentile 9, AHI 1.9  Through chart review it appears that back in 1997 patient's RDI was 50 and his initial sleep study.  Patient has since had bariatric surgery and has lost a  considerable amount of weight.   Patient is also exceptionally anxious.  He reports that this is his baseline.  He is currently out of his Xanax which he uses to manage his anxiety.  He is following up with primary care regarding this.  Patient is also recently completed a high-resolution CT of his chest.  Those results are listed below:  10/18/2018-CT chest high-res- mild bulb pulmonary fibrosis in a pattern with apical to basilar gradient features, mild tubular bronchiectasis, minimal subpleural bronchiolectasis ptosis, peripheral interstitial opacity and extensive associated groundglass, findings suggest an alternative diagnosis pattern by ATS guidelines.  Considerable differential generation should be NSIP.,  Mild enlargement of main pulmonary artery   Tests:   08/03/1995 -sleep study: RDI - 50  Titration study 2008 with optimal pressure 14cm. S/p bariatric surgery, started at 350 , lowest 222lbs  Auto 11/2012: optimal pressure 10cm  10/18/2018-CT chest high-res- mild bulb pulmonary fibrosis in a pattern with apical to basilar gradient features, mild tubular bronchiectasis, minimal subpleural bronchiolectasis ptosis, peripheral interstitial opacity and extensive associated groundglass, findings suggest an alternative diagnosis pattern by ATS guidelines.  Considerable differential generation should be NSIP.,  Mild enlargement of main pulmonary artery  FENO:  Lab Results  Component Value Date   NITRICOXIDE 40 11/23/2016    PFT: No flowsheet data found.  Imaging: No results found.    Specialty Problems      Pulmonary Problems  OSA on CPAP    Dxed 1996  08/03/1995 -sleep study: RDI - 50 Titration study 2008 with optimal pressure 14cm. S/p bariatric surgery.  Auto 11/2012: optimal pressure 10cm - 11/30/2015  Using > 4h 100% with autoset 7 and ahi 3.6         Cough variant asthma    FENO 11/23/2016  =   40 off all rx - Spirometry 11/23/2016  FEV1 4.06 (106%)  Ratio 82 with min  curvature  Off all rx       Upper airway cough syndrome    -Sinus CT 11/23/12 Nearly pansinus paranasal sinus inflammatory changes with fluid levels and bubbly opacity most compatible with acute sinusitis. - [facility]  eval 2014 rec surgery > declined - Allergy profile 11/23/2016 >  Eos 0.4 /  IgE  29  RAST neg      ILD (interstitial lung disease) ([place])    10/18/2018-CT chest high-res- mild bulb pulmonary fibrosis in a pattern with apical to basilar gradient features, mild tubular bronchiectasis, minimal subpleural bronchiolectasis ptosis, peripheral interstitial opacity and extensive associated groundglass, findings suggest an alternative diagnosis pattern by ATS guidelines.  Considerable differential generation should be NSIP.,  Mild enlargement of main pulmonary artery         Allergies  Allergen Reactions  . Breo Ellipta [Fluticasone Furoate-Vilanterol] Other (See Comments)    Thrush  . Demerol [Meperidine] Itching  . Lamotrigine Hives and Itching  . Nsaids Nausea Only    Gastric bypass    Immunization History  Administered Date(s) Administered  . Influenza Whole 12/13/2011  . Influenza,inj,Quad PF,6+ Mos 12/20/2012, 12/01/2014    Past Medical History:  Diagnosis Date  . Arthritis   . Asthma    anxiety, tree/grass pollen  . BPH (benign prostatic hyperplasia)   . Bronchitis    hx of  . Bulge of cervical disc without myelopathy   . Chest pain    a. 06/2015 MV: EF 56%, no ischemia/infarct.  . Complication of anesthesia [id number]   no complications but just says he typically needs "more than expected" to anesthetize; needs CPAP (setting 10) in recovery  . Depression    ADD  . Diabetes mellitus    PCP [name], type 2  . Insomnia   . Mitral valve prolapse    a. 06/2015 Echo: EF 65-70%, mild LVH, no rwma, mild MVP of ant leaflet and mild to mod posteriorly directed MR, mildly dil La.  . Obesity   . PVC's (premature ventricular contractions)   . Recurrent upper respiratory  infection (URI)    states Dr Wynelle Link aware- no fever- states is improving  . Sleep apnea    settings 10.6  / followed by [name] study years ago  . Tremor    d/t lithium    Tobacco History: Social History   Tobacco Use  Smoking Status Former Smoker  . Packs/day: 1.50  . Years: 20.00  . Pack years: 30.00  . Types: Cigarettes  . Quit date: 04/17/1990  . Years since quitting: 28.6  Smokeless Tobacco Never Used   Counseling given: Yes   Continue to not smoke  Outpatient Encounter Medications as of 11/27/2018  Medication Sig  . acetaminophen (TYLENOL) 500 MG tablet Take 1,000 mg by mouth daily as needed for headache.  . albuterol (PROVENTIL HFA;VENTOLIN HFA) 108 (90 Base) MCG/ACT inhaler Inhale 2 puffs into the lungs every 6 (six) hours as needed for wheezing or shortness of breath.  . ALPRAZolam (XANAX) 0.5 MG tablet Take  0.5-1 mg by mouth 3 (three) times daily as needed for anxiety. Reported on 05/19/2015  . aspirin EC 81 MG tablet Take 81 mg by mouth daily as needed for mild pain.  . DULoxetine (CYMBALTA) 60 MG capsule Take 2 capsules by mouth daily.  Marland Kitchen dutasteride (AVODART) 0.5 MG capsule Take 0.5 mg by mouth daily.  . fluticasone (FLONASE) 50 MCG/ACT nasal spray Place 2 sprays into the nose 2 (two) times daily.  Marland Kitchen HYDROcodone-acetaminophen (NORCO) 10-325 MG per tablet Take 1-2 tablets by mouth every 6 (six) hours as needed for moderate pain. (Patient taking differently: Take 1 tablet by mouth as needed for moderate pain. )  . metFORMIN (GLUCOPHAGE) 500 MG tablet Take 1,000 mg by mouth 2 (two) times daily with a meal.  . Multiple Vitamins-Minerals (CENTRUM SILVER ADULT 50+ PO) Take 1 tablet by mouth daily.  . pioglitazone (ACTOS) 45 MG tablet Take 45 mg by mouth daily.   . pravastatin (PRAVACHOL) 40 MG tablet Take 40 mg by mouth daily.  Marland Kitchen tiZANidine (ZANAFLEX) 4 MG tablet Take 1 tablet (4 mg total) by mouth every 6 (six) hours as needed for muscle spasms.  . traMADol  (ULTRAM) 50 MG tablet TAKE 1 TABLET BY MOUTH AS NEEDED  . UNABLE TO FIND Med Name: CPAP 10 cm   No facility-administered encounter medications on file as of 11/27/2018.      Review of Systems  Review of Systems  Constitutional: Positive for fatigue. Negative for activity change, chills, fever and unexpected weight change.  HENT: Negative for rhinorrhea, sinus pressure, sinus pain and sore throat.   Eyes: Negative.   Respiratory: Positive for cough and shortness of breath (feels its incresed from baseline). Negative for wheezing.   Cardiovascular: Negative for chest pain and palpitations.  Gastrointestinal: Negative for constipation, diarrhea, nausea and vomiting.  Endocrine: Negative.   Genitourinary: Negative.   Musculoskeletal: Negative.   Skin: Negative.   Neurological: Negative for dizziness and headaches.  Psychiatric/Behavioral: Negative for dysphoric mood. The patient is nervous/anxious.   All other systems reviewed and are negative.    Physical Exam  BP 116/72   Pulse 78   Temp (!) 96.7 F (35.9 C) (Temporal)   Ht 6\' 2"  (1.88 m)   Wt 217 lb 3.2 oz (98.5 kg)   SpO2 96%   BMI 27.89 kg/m   Wt Readings from Last 5 Encounters:  11/27/18 217 lb 3.2 oz (98.5 kg)  11/09/18 223 lb (101.2 kg)  08/03/17 245 lb (111.1 kg)  01/16/17 249 lb (112.9 kg)  11/23/16 254 lb (115.2 kg)    Physical Exam Vitals signs and nursing note reviewed.  Constitutional:      General: He is not in acute distress.    Appearance: Normal appearance. He is normal weight.  HENT:     Head: Normocephalic and atraumatic.     Right Ear: Hearing, tympanic membrane, ear canal and external ear normal.     Left Ear: Hearing, tympanic membrane, ear canal and external ear normal.     Nose: No mucosal edema.     Right Turbinates: Not enlarged.     Left Turbinates: Not enlarged.     Comments: Unable to eval due to pt wearing mask     Mouth/Throat:     Comments: Unable to eval due to pt wearing mask   Eyes:     Pupils: Pupils are equal, round, and reactive to light.  Neck:     Musculoskeletal: Normal range of motion.  Cardiovascular:  Rate and Rhythm: Normal rate and regular rhythm.     Pulses: Normal pulses.     Heart sounds: Normal heart sounds. No murmur.  Pulmonary:     Effort: Pulmonary effort is normal. No respiratory distress.     Breath sounds: Normal breath sounds. No decreased breath sounds, wheezing or rales.  Musculoskeletal:     Right lower leg: No edema.     Left lower leg: No edema.  Lymphadenopathy:     Cervical: No cervical adenopathy.  Skin:    General: Skin is warm and dry.     Capillary Refill: Capillary refill takes less than 2 seconds.     Findings: No erythema or rash.  Neurological:     General: No focal deficit present.     Mental Status: He is alert and oriented to person, place, and time.     Motor: No weakness.     Coordination: Coordination normal.     Gait: Gait is intact. Gait normal.  Psychiatric:        Attention and Perception: Attention normal.        Mood and Affect: Mood is anxious.        Speech: Speech is rapid and pressured.        Behavior: Behavior normal. Behavior is cooperative.        Thought Content: Thought content normal.        Judgment: Judgment normal.      Lab Results:  CBC    Component Value Date/Time   WBC 5.8 11/23/2016 1121   RBC 4.38 11/23/2016 1121   HGB 12.6 (L) 11/23/2016 1121   HCT 38.7 (L) 11/23/2016 1121   PLT 251.0 11/23/2016 1121   MCV 88.3 11/23/2016 1121   MCH 27.3 06/29/2015 1929   MCHC 32.6 11/23/2016 1121   RDW 15.2 11/23/2016 1121   LYMPHSABS 1.6 11/23/2016 1121   MONOABS 0.7 11/23/2016 1121   EOSABS 0.4 11/23/2016 1121   BASOSABS 0.1 11/23/2016 1121    BMET    Component Value Date/Time   NA 138 06/29/2015 1122   K 4.3 06/29/2015 1122   CL 104 06/29/2015 1122   CO2 22 06/29/2015 1122   GLUCOSE 262 (H) 06/29/2015 1122   BUN 14 06/29/2015 1122   CREATININE 0.89 06/29/2015 1929    CALCIUM 9.0 06/29/2015 1122   GFRNONAA >60 06/29/2015 1929   GFRAA >60 06/29/2015 1929    BNP No results found for: BNP  ProBNP No results found for: PROBNP    Assessment & Plan:   OSA on CPAP Plan: Continue CPAP We will order new CPAP today Follow-up in 1 year with Dr. Elsworth Soho  Cough variant asthma Plan: We will order pulmonary function testing Return to office for follow-up to see Dr. Melvyn Novas in 2 to 4 weeks as he has not been seen since 2018  Abnormal findings on diagnostic imaging of lung Plan: Pulmonary function test to be ordered Likely ILD due to history of environmental exposures as patient works in [employer], educational as well as food processing  ILD (interstitial lung disease) ([place]) Plan: We will order pulmonary function testing May need to consider repeat echo in the future  Close follow-up with Dr. Melvyn Novas in 2 to 4 weeks to return office as patient is not been seen since 2018 Could consider referral to ILD specialist in the future if there is progression We will tentatively plan on repeating a CT in 1 year     Return in about 2 weeks (around 12/11/2018),  or if symptoms worsen or fail to improve, for Follow up with Dr. Melvyn Novas.   Lauraine Rinne, NP 11/27/2018   This appointment was 28 minutes long with over 50% of the time in direct face-to-face patient care, assessment, plan of care, and follow-up.

## 2018-11-27 NOTE — Assessment & Plan Note (Addendum)
Plan: We will order pulmonary function testing May need to consider repeat echo in the future  Close follow-up with Dr. Melvyn Novas in 2 to 4 weeks to return office as patient is not been seen since 2018 Could consider referral to ILD specialist in the future if there is progression We will tentatively plan on repeating a CT in 1 year

## 2018-11-28 NOTE — Telephone Encounter (Signed)
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  Upon review of the chart order was placed and [facility] confirmed receipt of order on 9/15 at 1638.  Emailed patient back this information. Nothing further needed at this time.

## 2018-11-29 ENCOUNTER — Telehealth: Payer: Self-pay | Admitting: Internal Medicine

## 2018-11-30 NOTE — Telephone Encounter (Signed)
pft being scheduled by front desk staff - pr

## 2018-12-03 DIAGNOSIS — Z23 Encounter for immunization: Secondary | ICD-10-CM | POA: Diagnosis not present

## 2018-12-05 DIAGNOSIS — F3130 Bipolar disorder, current episode depressed, mild or moderate severity, unspecified: Secondary | ICD-10-CM | POA: Diagnosis not present

## 2018-12-07 ENCOUNTER — Ambulatory Visit: Payer: Medicare Other | Admitting: Internal Medicine

## 2018-12-11 ENCOUNTER — Ambulatory Visit: Payer: Medicare Other | Admitting: Internal Medicine

## 2018-12-19 ENCOUNTER — Telehealth: Payer: Self-pay | Admitting: Internal Medicine

## 2018-12-20 ENCOUNTER — Other Ambulatory Visit: Payer: Self-pay | Admitting: Internal Medicine

## 2018-12-20 NOTE — Telephone Encounter (Signed)
Pt returning call to set up his Covid Test appointment. He can be reached at [phone]

## 2018-12-20 NOTE — Telephone Encounter (Signed)
Attempted to call patient back at both numbers on patient's chart, no answer, left messages to call back.

## 2018-12-21 NOTE — Telephone Encounter (Signed)
Attempted to call patient, no answer, left message to call back.   I see that his pre-procedure covid testing has already been scheduled. Will close encounter.

## 2018-12-25 DIAGNOSIS — M25811 Other specified joint disorders, right shoulder: Secondary | ICD-10-CM | POA: Diagnosis not present

## 2018-12-25 DIAGNOSIS — M25511 Pain in right shoulder: Secondary | ICD-10-CM | POA: Diagnosis not present

## 2019-01-08 ENCOUNTER — Ambulatory Visit: Payer: Medicare Other | Admitting: Internal Medicine

## 2019-01-15 DIAGNOSIS — M4316 Spondylolisthesis, lumbar region: Secondary | ICD-10-CM | POA: Diagnosis not present

## 2019-01-15 DIAGNOSIS — M47816 Spondylosis without myelopathy or radiculopathy, lumbar region: Secondary | ICD-10-CM | POA: Diagnosis not present

## 2019-01-15 DIAGNOSIS — M545 Low back pain: Secondary | ICD-10-CM | POA: Diagnosis not present

## 2019-01-15 DIAGNOSIS — M5136 Other intervertebral disc degeneration, lumbar region: Secondary | ICD-10-CM | POA: Diagnosis not present

## 2019-01-18 DIAGNOSIS — K5901 Slow transit constipation: Secondary | ICD-10-CM | POA: Diagnosis not present

## 2019-01-18 DIAGNOSIS — Z1389 Encounter for screening for other disorder: Secondary | ICD-10-CM | POA: Diagnosis not present

## 2019-01-18 DIAGNOSIS — R634 Abnormal weight loss: Secondary | ICD-10-CM | POA: Diagnosis not present

## 2019-01-18 DIAGNOSIS — J849 Interstitial pulmonary disease, unspecified: Secondary | ICD-10-CM | POA: Diagnosis not present

## 2019-01-18 DIAGNOSIS — Z79899 Other long term (current) drug therapy: Secondary | ICD-10-CM | POA: Diagnosis not present

## 2019-01-18 DIAGNOSIS — I700 Atherosclerosis of aorta: Secondary | ICD-10-CM | POA: Diagnosis not present

## 2019-01-18 DIAGNOSIS — E1169 Type 2 diabetes mellitus with other specified complication: Secondary | ICD-10-CM | POA: Diagnosis not present

## 2019-01-18 DIAGNOSIS — E7800 Pure hypercholesterolemia, unspecified: Secondary | ICD-10-CM | POA: Diagnosis not present

## 2019-01-18 DIAGNOSIS — D509 Iron deficiency anemia, unspecified: Secondary | ICD-10-CM | POA: Diagnosis not present

## 2019-01-18 DIAGNOSIS — Z0000 Encounter for general adult medical examination without abnormal findings: Secondary | ICD-10-CM | POA: Diagnosis not present

## 2019-01-23 ENCOUNTER — Telehealth: Payer: Self-pay | Admitting: Adult Health

## 2019-01-23 NOTE — Telephone Encounter (Signed)
Received a new hem referral from Dr. Felipa Eth for iron deficiency anemia possible absorption issue evaluation treatment. [name] has been cld and scheduled to see Wilber Bihari on 11/23 at 930am w/labs at 9am. Pt aware to arrive 15 minutes early

## 2019-01-28 DIAGNOSIS — M47816 Spondylosis without myelopathy or radiculopathy, lumbar region: Secondary | ICD-10-CM | POA: Diagnosis not present

## 2019-01-31 ENCOUNTER — Other Ambulatory Visit (HOSPITAL_COMMUNITY): Admission: RE | Admit: 2019-01-31 | Payer: Medicare Other | Source: Ambulatory Visit

## 2019-02-01 ENCOUNTER — Other Ambulatory Visit (HOSPITAL_COMMUNITY)
Admission: RE | Admit: 2019-02-01 | Discharge: 2019-02-01 | Disposition: A | Discharge status: Home / Self Care | Payer: Medicare Other | Source: Ambulatory Visit | Attending: Internal Medicine | Admitting: Internal Medicine

## 2019-02-01 ENCOUNTER — Other Ambulatory Visit: Payer: Self-pay

## 2019-02-01 DIAGNOSIS — Z20828 Contact with and (suspected) exposure to other viral communicable diseases: Secondary | ICD-10-CM | POA: Insufficient documentation

## 2019-02-01 DIAGNOSIS — Z01812 Encounter for preprocedural laboratory examination: Secondary | ICD-10-CM | POA: Insufficient documentation

## 2019-02-01 LAB — SARS CORONAVIRUS 2 (TAT 6-24 HRS): SARS Coronavirus 2: NEGATIVE

## 2019-02-04 ENCOUNTER — Encounter: Payer: Self-pay | Admitting: Adult Health

## 2019-02-04 ENCOUNTER — Inpatient Hospital Stay: Payer: Medicare Other

## 2019-02-04 ENCOUNTER — Telehealth: Payer: Self-pay

## 2019-02-04 ENCOUNTER — Telehealth: Payer: Self-pay | Admitting: Adult Health

## 2019-02-04 ENCOUNTER — Inpatient Hospital Stay: Payer: Medicare Other | Attending: Adult Health | Admitting: Adult Health

## 2019-02-04 ENCOUNTER — Other Ambulatory Visit: Payer: Self-pay

## 2019-02-04 VITALS — BP 117/81 | HR 62 | Temp 98.2°F | Resp 17 | Ht 74.0 in | Wt 213.2 lb

## 2019-02-04 DIAGNOSIS — E119 Type 2 diabetes mellitus without complications: Secondary | ICD-10-CM | POA: Insufficient documentation

## 2019-02-04 DIAGNOSIS — Z7984 Long term (current) use of oral hypoglycemic drugs: Secondary | ICD-10-CM | POA: Insufficient documentation

## 2019-02-04 DIAGNOSIS — N400 Enlarged prostate without lower urinary tract symptoms: Secondary | ICD-10-CM | POA: Diagnosis not present

## 2019-02-04 DIAGNOSIS — R5383 Other fatigue: Secondary | ICD-10-CM | POA: Diagnosis not present

## 2019-02-04 DIAGNOSIS — R634 Abnormal weight loss: Secondary | ICD-10-CM

## 2019-02-04 DIAGNOSIS — Z7982 Long term (current) use of aspirin: Secondary | ICD-10-CM | POA: Diagnosis not present

## 2019-02-04 DIAGNOSIS — Z79899 Other long term (current) drug therapy: Secondary | ICD-10-CM | POA: Insufficient documentation

## 2019-02-04 DIAGNOSIS — G4730 Sleep apnea, unspecified: Secondary | ICD-10-CM | POA: Diagnosis not present

## 2019-02-04 DIAGNOSIS — I2510 Atherosclerotic heart disease of native coronary artery without angina pectoris: Secondary | ICD-10-CM | POA: Diagnosis not present

## 2019-02-04 DIAGNOSIS — D509 Iron deficiency anemia, unspecified: Secondary | ICD-10-CM | POA: Insufficient documentation

## 2019-02-04 DIAGNOSIS — J8410 Pulmonary fibrosis, unspecified: Secondary | ICD-10-CM | POA: Diagnosis not present

## 2019-02-04 DIAGNOSIS — M1990 Unspecified osteoarthritis, unspecified site: Secondary | ICD-10-CM | POA: Diagnosis not present

## 2019-02-04 DIAGNOSIS — K909 Intestinal malabsorption, unspecified: Secondary | ICD-10-CM | POA: Diagnosis not present

## 2019-02-04 DIAGNOSIS — Z87891 Personal history of nicotine dependence: Secondary | ICD-10-CM | POA: Insufficient documentation

## 2019-02-04 DIAGNOSIS — F418 Other specified anxiety disorders: Secondary | ICD-10-CM | POA: Insufficient documentation

## 2019-02-04 DIAGNOSIS — Z9884 Bariatric surgery status: Secondary | ICD-10-CM | POA: Diagnosis not present

## 2019-02-04 DIAGNOSIS — M25519 Pain in unspecified shoulder: Secondary | ICD-10-CM | POA: Diagnosis not present

## 2019-02-04 DIAGNOSIS — D649 Anemia, unspecified: Secondary | ICD-10-CM | POA: Insufficient documentation

## 2019-02-04 DIAGNOSIS — D508 Other iron deficiency anemias: Secondary | ICD-10-CM

## 2019-02-04 DIAGNOSIS — Z9989 Dependence on other enabling machines and devices: Secondary | ICD-10-CM | POA: Diagnosis not present

## 2019-02-04 LAB — CBC WITH DIFFERENTIAL (CANCER CENTER ONLY)
Abs Immature Granulocytes: 0.01 10*3/uL (ref 0.00–0.07)
Basophils Absolute: 0.1 10*3/uL (ref 0.0–0.1)
Basophils Relative: 1 %
Eosinophils Absolute: 0.3 10*3/uL (ref 0.0–0.5)
Eosinophils Relative: 6 %
HCT: 37.1 % — ABNORMAL LOW (ref 39.0–52.0)
Hemoglobin: 12.1 g/dL — ABNORMAL LOW (ref 13.0–17.0)
Immature Granulocytes: 0 %
Lymphocytes Relative: 30 %
Lymphs Abs: 1.5 10*3/uL (ref 0.7–4.0)
MCH: 30.3 pg (ref 26.0–34.0)
MCHC: 32.6 g/dL (ref 30.0–36.0)
MCV: 93 fL (ref 80.0–100.0)
Monocytes Absolute: 0.5 10*3/uL (ref 0.1–1.0)
Monocytes Relative: 10 %
Neutro Abs: 2.6 10*3/uL (ref 1.7–7.7)
Neutrophils Relative %: 53 %
Platelet Count: 203 10*3/uL (ref 150–400)
RBC: 3.99 MIL/uL — ABNORMAL LOW (ref 4.22–5.81)
RDW: 13.1 % (ref 11.5–15.5)
WBC Count: 5 10*3/uL (ref 4.0–10.5)
nRBC: 0 % (ref 0.0–0.2)

## 2019-02-04 LAB — IRON AND TIBC
Iron: 72 ug/dL (ref 42–163)
Saturation Ratios: 23 % (ref 20–55)
TIBC: 311 ug/dL (ref 202–409)
UIBC: 240 ug/dL (ref 117–376)

## 2019-02-04 LAB — CMP (CANCER CENTER ONLY)
ALT: 19 U/L (ref 0–44)
AST: 21 U/L (ref 15–41)
Albumin: 3.9 g/dL (ref 3.5–5.0)
Alkaline Phosphatase: 53 U/L (ref 38–126)
Anion gap: 8 (ref 5–15)
BUN: 15 mg/dL (ref 8–23)
CO2: 28 mmol/L (ref 22–32)
Calcium: 9.3 mg/dL (ref 8.9–10.3)
Chloride: 105 mmol/L (ref 98–111)
Creatinine: 0.85 mg/dL (ref 0.61–1.24)
GFR, Est AFR Am: >60 mL/min (ref >60)
GFR, Estimated: >60 mL/min (ref >60)
Glucose, Bld: 133 mg/dL — ABNORMAL HIGH (ref 70–99)
Potassium: 4.7 mmol/L (ref 3.5–5.1)
Sodium: 141 mmol/L (ref 135–145)
Total Bilirubin: 0.4 mg/dL (ref 0.3–1.2)
Total Protein: 6.8 g/dL (ref 6.5–8.1)

## 2019-02-04 LAB — RETIC PANEL
Immature Retic Fract: 5.4 % (ref 2.3–15.9)
RBC.: 3.99 MIL/uL — ABNORMAL LOW (ref 4.22–5.81)
Retic Count, Absolute: 47.5 10*3/uL (ref 19.0–186.0)
Retic Ct Pct: 1.2 % (ref 0.4–3.1)
Reticulocyte Hemoglobin: 35.4 pg (ref >27.9)

## 2019-02-04 LAB — SAVE SMEAR(SSMR), FOR PROVIDER SLIDE REVIEW

## 2019-02-04 LAB — VITAMIN B12: Vitamin B-12: 5204 pg/mL — ABNORMAL HIGH (ref 180–914)

## 2019-02-04 LAB — LACTATE DEHYDROGENASE: LDH: 277 U/L — ABNORMAL HIGH (ref 98–192)

## 2019-02-04 LAB — FOLATE: Folate: 15.6 ng/mL (ref >5.9)

## 2019-02-04 NOTE — Telephone Encounter (Signed)
Spoke with patient to inform that iron and Shirlean Kelly is approved.  Patient voiced understanding and is waiting for some one to call and set up his first appt.

## 2019-02-04 NOTE — Telephone Encounter (Signed)
Thanks Cecille Rubin.  Please let him know that we were able to add the labs on that we wanted today, and he will not need the lab appointment on Friday.  We can cancel it.    Thanks,    Mendel Ryder

## 2019-02-04 NOTE — Telephone Encounter (Signed)
I talk with patient regarding schedule  

## 2019-02-04 NOTE — Progress Notes (Addendum)
[place]  [phone] [phone]     ID: [name]. 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  He notes that since 2014 he has been told that he has anemia and iron deficiency.  He has been taking iron supplementation which has been constipating for him, particularly since he is taking Hydrocodone for his knee, hip, and shoulder pain.  He underwent colonoscopy in 2016 and FOBT last in 2019.    [name] notes since February or March of this year he has lost about 40 pounds.  He says that he underwent his upper teeth dental extractions and had swollen gums that took 10-12 weeks to heal to make denture implants.  The dentures he received in September of this year.  During the time of swollen gums, he ate minimal food, mainly soft, and just enough to satiate his appetite.     The patient's subsequent history is as detailed below.  INTERVAL HISTORY: [name] is here today with his wife [name].  He is doing moderately well, though he does note anxiety because he is at a cancer center for iron deficiency.  He notes that he is fatigued.  He has pulmonary fibrosis and will f/u with Ronald. Melvyn Gillespie a bout this.   REVIEW OF SYSTEMS:  Other than his fatigue, he is feeling moderately well.  He does wear a CPAP at night for sleep.  He takes frequent naps throughout the day.  He isn't short of breath.  He has no recent fever, chills, night sweats, lymphadenopathy.  He has no nausea, vomiting, bowel/bladder changes, melena, hematochezia.  He is without chest pain, palpitations, cough.  He does note he tore  muscles and his rotator cuff recently and is scheduled for surgery on 02/22/2019 with Ronald. Veverly Gillespie. A detailed ROS was otherwise non contributory.    PAST MEDICAL HISTORY: Past Medical History:  Diagnosis Date   Arthritis    Asthma    anxiety, tree/grass pollen   BPH (benign prostatic hyperplasia)    Bronchitis    hx of   Bulge of cervical disc without myelopathy    Chest pain    a. 06/2015 MV: EF 56%, no ischemia/infarct.   Complication of anesthesia 6/81/27   no complications but just says he typically needs "more than expected" to anesthetize; needs CPAP (setting 10) in recovery   Depression    ADD   Diabetes mellitus    PCP [name], type 2   Insomnia    Mitral valve prolapse    a. 06/2015 Echo: EF 65-70%, mild LVH, no rwma, mild MVP of ant leaflet and mild to mod posteriorly directed MR, mildly dil La.   Obesity    PVC's (premature ventricular contractions)    Recurrent upper respiratory infection (URI)    states [name] aware- no fever- states is improving   Sleep apnea    settings 10.6  / followed by Ronald [name] study years ago   Tremor    d/t lithium    PAST SURGICAL HISTORY: Past Surgical History:  Procedure Laterality Date   CARDIAC CATHETERIZATION  2008 pre op gastric bypass   COLONOSCOPY W/ POLYPECTOMY     GASTRIC BYPASS  2008   Roux en y   JOINT REPLACEMENT     Left, Right Knee, Right hip   KNEE ARTHROSCOPY     right x 3-4, left x 2   KNEE ARTHROTOMY  ~1991   right   LUMBAR FUSION  04/03/2013   [name], L5-S1   POLYPECTOMY     vocal cords 1992   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   Procedure: LEFT TOTAL HIP ARTHROPLASTY ANTERIOR APPROACH;  Surgeon: Ronald Cancel, MD;  Location: [facility];  Service: Orthopedics;  Laterality: Left;   TOTAL HIP REVISION  04/22/2011   Procedure: TOTAL HIP REVISION;  Surgeon: Ronald Alf, MD;  Location: [facility];  Service: Orthopedics;  Laterality: Right;    FAMILY HISTORY Family History    Problem Relation Age of Onset   Heart disease Father    Anxiety disorder Mother    Asthma Maternal Grandmother     SOCIAL HISTORY:  [name] is married and lives with his wife in [place], [place].  His sister in law visits from time to time.  He is retired, and did so in 07/2012.  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       ADVANCED DIRECTIVES: Not in place, planning to complete.    HEALTH MAINTENANCE: Social History   Tobacco Use   Smoking status: Former Smoker    Packs/day: 1.50    Years: 20.00    Pack years: 30.00    Types: Cigarettes    Quit date: 04/17/1990    Years since quitting: 28.8   Smokeless tobacco: Never Used  Substance Use Topics   Alcohol use: No   Drug use: No     Colonoscopy:  PAP:  Bone density:   Allergies  Allergen Reactions   Breo Ellipta [Fluticasone Furoate-Vilanterol] Other (See Comments)    Thrush   Demerol [Meperidine] Itching   Lamotrigine Hives and Itching   Nsaids Nausea Only    Gastric bypass    Current Outpatient Medications  Medication Sig Dispense Refill   acetaminophen (TYLENOL) 500 MG tablet Take 1,000 mg by mouth daily as needed for headache.     ALPRAZolam (XANAX) 0.5 MG tablet Take 0.5-1 mg by mouth 3 (three) times daily as needed for anxiety. Reported on 05/19/2015     aspirin EC 81 MG tablet Take 81 mg by mouth daily as needed for mild pain.     DULoxetine (CYMBALTA) 60 MG capsule Take 2 capsules by mouth daily.  5   dutasteride (AVODART) 0.5 MG capsule Take 0.5 mg by mouth daily.     fluticasone (FLONASE) 50 MCG/ACT nasal spray Place 2 sprays into the nose 2 (two) times daily.     HYDROcodone-acetaminophen (NORCO) 10-325 MG per tablet Take 1-2 tablets by mouth every 6 (six) hours as needed for moderate pain. (Patient taking differently: Take 1 tablet by mouth as needed for moderate pain. ) 90 tablet 0   metFORMIN (GLUCOPHAGE) 500 MG tablet Take 1,000 mg by mouth 2 (two) times  daily with a meal.     methocarbamol (ROBAXIN) 500 MG tablet Take 500 mg by mouth 3 (three) times daily as needed.     Multiple Vitamins-Minerals (CENTRUM SILVER ADULT 50+ PO) Take 1 tablet by mouth daily.     pioglitazone (ACTOS) 45 MG tablet Take 45 mg by mouth daily.      pravastatin (PRAVACHOL) 40  MG tablet Take 40 mg by mouth daily.     traMADol (ULTRAM) 50 MG tablet TAKE 1 TABLET BY MOUTH AS NEEDED  2   UNABLE TO FIND Med Name: CPAP 10 cm     albuterol (PROVENTIL HFA;VENTOLIN HFA) 108 (90 Base) MCG/ACT inhaler Inhale 2 puffs into the lungs every 6 (six) hours as needed for wheezing or shortness of breath.     No current facility-administered medications for this visit.     OBJECTIVE:  Vitals:   02/04/19 0921  BP: 117/81  Pulse: 62  Resp: 17  Temp: 98.2 F (36.8 C)  SpO2: 99%     Body mass index is 27.37 kg/m.   Wt Readings from Last 3 Encounters:  02/04/19 213 lb 3.2 oz (96.7 kg)  11/27/18 217 lb 3.2 oz (98.5 kg)  11/09/18 223 lb (101.2 kg)  ECOG FS:1 - Symptomatic but completely ambulatory GENERAL: Patient is a well appearing male in no acute distress HEENT:  Sclerae anicteric.  Oropharynx clear and moist.  Mask in place. Neck is supple.  NODES:  No cervical, supraclavicular, or axillary lymphadenopathy palpated.  LUNGS:  Clear to auscultation bilaterally.  No wheezes or rhonchi. HEART:  Regular rate and rhythm. No murmur appreciated. ABDOMEN:  Soft, nontender.  Positive, normoactive bowel sounds. No organomegaly palpated. MSK:  No focal spinal tenderness to palpation.  EXTREMITIES:  No peripheral edema.   SKIN:  Clear with no obvious rashes or skin changes. No nail dyscrasia. NEURO:  Nonfocal. Well oriented.  Appropriate affect.     LAB RESULTS:  CMP     Component Value Date/Time   NA 141 02/04/2019 0854   K 4.7 02/04/2019 0854   CL 105 02/04/2019 0854   CO2 28 02/04/2019 0854   GLUCOSE 133 (H) 02/04/2019 0854   BUN 15 02/04/2019 0854   CREATININE  0.85 02/04/2019 0854   CALCIUM 9.3 02/04/2019 0854   PROT 6.8 02/04/2019 0854   ALBUMIN 3.9 02/04/2019 0854   AST 21 02/04/2019 0854   ALT 19 02/04/2019 0854   ALKPHOS 53 02/04/2019 0854   BILITOT 0.4 02/04/2019 0854   GFRNONAA >60 02/04/2019 0854   GFRAA >60 02/04/2019 0854    No results found for: TOTALPROTELP, ALBUMINELP, A1GS, A2GS, BETS, BETA2SER, GAMS, MSPIKE, SPEI  No results found for: Nils Pyle, [facility]  Lab Results  Component Value Date   WBC 5.0 02/04/2019   NEUTROABS 2.6 02/04/2019   HGB 12.1 (L) 02/04/2019   HCT 37.1 (L) 02/04/2019   MCV 93.0 02/04/2019   PLT 203 02/04/2019      Chemistry      Component Value Date/Time   NA 141 02/04/2019 0854   K 4.7 02/04/2019 0854   CL 105 02/04/2019 0854   CO2 28 02/04/2019 0854   BUN 15 02/04/2019 0854   CREATININE 0.85 02/04/2019 0854      Component Value Date/Time   CALCIUM 9.3 02/04/2019 0854   ALKPHOS 53 02/04/2019 0854   AST 21 02/04/2019 0854   ALT 19 02/04/2019 0854   BILITOT 0.4 02/04/2019 0854       No results found for: LABCA2  No components found for: [id number]  No results for input(s): INR in the last 168 hours.  No results found for: LABCA2  No results found for: HAL937  No results found for: TKW409  No results found for: BDZ329  No results found for: CA2729  No components found for: HGQUANT  No results found for: CEA1 / No results found  for: CEA1   No results found for: AFPTUMOR  No results found for: [place]  No results found for: PSA1  Appointment on 02/04/2019  Component Date Value Ref Range Status   Sodium 02/04/2019 141  135 - 145 mmol/L Final   Potassium 02/04/2019 4.7  3.5 - 5.1 mmol/L Final   Chloride 02/04/2019 105  98 - 111 mmol/L Final   CO2 02/04/2019 28  22 - 32 mmol/L Final   Glucose, Bld 02/04/2019 133* 70 - 99 mg/dL Final   BUN 02/04/2019 15  8 - 23 mg/dL Final   Creatinine 02/04/2019 0.85  0.61 - 1.24 mg/dL Final   Calcium  02/04/2019 9.3  8.9 - 10.3 mg/dL Final   Total Protein 02/04/2019 6.8  6.5 - 8.1 g/dL Final   Albumin 02/04/2019 3.9  3.5 - 5.0 g/dL Final   AST 02/04/2019 21  15 - 41 U/L Final   ALT 02/04/2019 19  0 - 44 U/L Final   Alkaline Phosphatase 02/04/2019 53  38 - 126 U/L Final   Total Bilirubin 02/04/2019 0.4  0.3 - 1.2 mg/dL Final   GFR, Est Non Af Am 02/04/2019 >60  >60 mL/min Final   GFR, Est AFR Am 02/04/2019 >60  >60 mL/min Final   Anion gap 02/04/2019 8  5 - 15 Final   Performed at [facility], [place] [address], [place], [place] [address]   Smear Review 02/04/2019 SMEAR STAINED AND AVAILABLE FOR REVIEW   Final   Performed at [facility], [place] [address] [place], [place] [address]   WBC Count 02/04/2019 5.0  4.0 - 10.5 K/uL Final   RBC 02/04/2019 3.99* 4.22 - 5.81 MIL/uL Final   Hemoglobin 02/04/2019 12.1* 13.0 - 17.0 g/dL Final   HCT 02/04/2019 37.1* 39.0 - 52.0 % Final   MCV 02/04/2019 93.0  80.0 - 100.0 fL Final   MCH 02/04/2019 30.3  26.0 - 34.0 pg Final   MCHC 02/04/2019 32.6  30.0 - 36.0 g/dL Final   RDW 02/04/2019 13.1  11.5 - 15.5 % Final   Platelet Count 02/04/2019 203  150 - 400 K/uL Final   nRBC 02/04/2019 0.0  0.0 - 0.2 % Final   Neutrophils Relative % 02/04/2019 53  % Final   Neutro Abs 02/04/2019 2.6  1.7 - 7.7 K/uL Final   Lymphocytes Relative 02/04/2019 30  % Final   Lymphs Abs 02/04/2019 1.5  0.7 - 4.0 K/uL Final   Monocytes Relative 02/04/2019 10  % Final   Monocytes Absolute 02/04/2019 0.5  0.1 - 1.0 K/uL Final   Eosinophils Relative 02/04/2019 6  % Final   Eosinophils Absolute 02/04/2019 0.3  0.0 - 0.5 K/uL Final   Basophils Relative 02/04/2019 1  % Final   Basophils Absolute 02/04/2019 0.1  0.0 - 0.1 K/uL Final   Immature Granulocytes 02/04/2019 0  % Final   Abs Immature Granulocytes 02/04/2019 0.01  0.00 - 0.07 K/uL Final   Performed at [facility], [place] [address] [place], [place] [address]   LDH 02/04/2019 277* 98 - 192 U/L Final   Performed at [facility], [place] [address] [place], [place] [address]   Folate 02/04/2019 15.6  >5.9 ng/mL Final   Performed at [facility], [place] [address] [place], [place] [address]   Retic Ct Pct 02/04/2019 1.2  0.4 - 3.1 % Final   RBC. 02/04/2019 3.99* 4.22 - 5.81 MIL/uL Final  Retic Count, Absolute 02/04/2019 47.5  19.0 - 186.0 K/uL Final   Immature Retic Fract 02/04/2019 5.4  2.3 - 15.9 % Final   Reticulocyte Hemoglobin 02/04/2019 35.4  >27.9 pg Final   Comment:        Given the high negative predictive value of a RET-He result > 32 pg iron deficiency is essentially excluded. If this patient is anemic other etiologies should be considered. Performed at [facility], [place] [address], [place], [place] [address]    Iron 02/04/2019 72  42 - 163 ug/dL Final   TIBC 02/04/2019 311  202 - 409 ug/dL Final   Saturation Ratios 02/04/2019 23  20 - 55 % Final   UIBC 02/04/2019 240  117 - 376 ug/dL Final   Performed at [facility], [place] [address] [place], [place] [address]    (this displays the last labs from the last 3 days)  No results found for: TOTALPROTELP, ALBUMINELP, A1GS, A2GS, BETS, BETA2SER, GAMS, MSPIKE, SPEI (this displays SPEP labs)  No results found for: KPAFRELGTCHN, LAMBDASER, KAPLAMBRATIO (kappa/lambda light chains)  No results found for: HGBA, HGBA2QUANT, HGBFQUANT, HGBSQUAN (Hemoglobinopathy evaluation)   Lab Results  Component Value Date   LDH 277 (H) 02/04/2019    Lab Results  Component Value Date   IRON 72 02/04/2019   TIBC 311 02/04/2019   IRONPCTSAT 23 02/04/2019   (Iron and TIBC)  No results found for: FERRITIN  Urinalysis    Component Value Date/Time   COLORURINE AMBER (A) 10/14/2014 0944   APPEARANCEUR CLOUDY (A) 10/14/2014 0944   LABSPEC 1.023 10/14/2014  0944   PHURINE 5.0 10/14/2014 0944   GLUCOSEU 500 (A) 10/14/2014 0944   HGBUR LARGE (A) 10/14/2014 [place] NEGATIVE 10/14/2014 0944   KETONESUR NEGATIVE 10/14/2014 0944   PROTEINUR 30 (A) 10/14/2014 0944   UROBILINOGEN 0.2 10/14/2014 0944   NITRITE NEGATIVE 10/14/2014 0944   LEUKOCYTESUR LARGE (A) 10/14/2014 0944     STUDIES: No results found.   ASSESSMENT: 73 y.o. [place] man, with malabsorptive iron deficiency  1. S/p gastric bypass in 2008  2. Ferritin most recently 15 in 01/2019  3. To receive Feraheme x 2 weeks beginning on 02/08/2019  PLAN:  [name] met with Ronald. Jana Hakim and myself who reviewed his preliminary lab work.  He is slightly anemic and iron deficienct.  Ronald. Jana Hakim reviewed the anemia and that it is likely malabsorpitve from gastric bypass surgery.  We have added on some lab work that will evaluate his b12, folate, further iron studies, a retic count, and LDH.  [name] was recommended feraheme x 2 to start on Friday, 02/08/2019 and he will receive it again on 02/15/2019.  He was given information about this in his AVS.    The weight loss is likely due to his significant diet changes due to his dental work.  We can assess further in three months as he continues to adjust to his new dentures and work on his diet.    We will see [name] back in 3 months for labs and f/u to discuss his iron deficiency further.  He has a good understanding of the plan and agrees with it.  he was recommended to continue with the appropriate pandemic precautions.  he knows to call for any questions that may arise between now and his next appointment.  We are happy to see him sooner if needed.   Ronald Bihari, NP   02/04/2019 12:38 PM Medical Oncology and  Hematology [facility] [place], [place] [address] Tel. [phone]    Fax. [phone]   ADDENDUM: Mr Ashworth ferritin is 15 with a normal iron saturation.  His hemoglobin is just under  normal with an inappropriately normal reticulocyte count.  He has no evidence of renal insufficiency and review of his peripheral blood film shows the red cells to be unremarkable, with mild anisocytosis, no fragments, no nucleated red cells, no spherocytes, and no significant hypochromia.  I think most likely what we are dealing with is malabsorption secondary to his prior bypass surgery.  We are checking his folate and B12 as well today.  In addition to the difficulty absorbing iron it has been difficult for him to take oral iron because of constipation, gastric upset and other side effects.  I think you will benefit from intravenous iron.  We discussed the possible toxicities side effects and complications of these infusions and he is very eager to proceed.  I do think that he will feel better once his iron stores have been replenished.  The issue is not his very minimal anemia but the fact that iron is used in many enzymes in the body and he may be of sensing that deficiency  We discussed the fact that his significant weight loss is likely due to his recent dental work.  The recent CT scan of the chest that he had certainly shows no evidence of metastatic disease.  This does not require any further work at this point but if when I see him in 3 months his weight has not stabilized we will proceed for a more thorough work-up.  I personally saw this patient and performed a substantive portion of this encounter with the listed APP documented above.    Ronald Cruel, MD Medical Oncology and Hematology [facility] [place] [address] [place], [place] [address] Tel. [phone]    Fax. [phone]

## 2019-02-04 NOTE — Patient Instructions (Signed)

## 2019-02-05 ENCOUNTER — Telehealth: Payer: Self-pay

## 2019-02-05 NOTE — H&P (Signed)
Patient's anticipated LOS is less than 2 midnights, meeting these requirements: - Younger than 33 - Lives within 1 hour of care - Has a competent adult at home to recover with post-op recover - NO history of  - Chronic pain requiring opiods  - Diabetes  - Coronary Artery Disease  - Heart failure  - Heart attack  - Stroke  - DVT/VTE  - Cardiac arrhythmia  - Respiratory Failure/COPD  - Renal failure  - Anemia  - Advanced Liver disease       [name]. is an 73 y.o. male.    Chief Complaint: right shoulder pain  HPI: Pt is a 73 y.o. male complaining of right shoulder  pain for multiple years. Pain had continually increased since the beginning. X-rays in the clinic show end-stage arthritic changes of the right shoulder. Pt has tried various conservative treatments which have failed to alleviate their symptoms, including injections and therapy. Various options are discussed with the patient. Risks, benefits and expectations were discussed with the patient. Patient understand the risks, benefits and expectations and wishes to proceed with surgery.   PCP:  Lajean Manes, MD  D/C Plans: Home  PMH: Past Medical History:  Diagnosis Date  . Arthritis   . Asthma    anxiety, tree/grass pollen  . BPH (benign prostatic hyperplasia)   . Bronchitis    hx of  . Bulge of cervical disc without myelopathy   . Chest pain    a. 06/2015 MV: EF 56%, no ischemia/infarct.  . Complication of anesthesia 6/81/15   no complications but just says he typically needs "more than expected" to anesthetize; needs CPAP (setting 10) in recovery  . Depression    ADD  . Diabetes mellitus    PCP [name], type 2  . Insomnia   . Mitral valve prolapse    a. 06/2015 Echo: EF 65-70%, mild LVH, no rwma, mild MVP of ant leaflet and mild to mod posteriorly directed MR, mildly dil La.  . Obesity   . PVC's (premature ventricular contractions)   . Recurrent upper respiratory infection (URI)    states Dr  Wynelle Link aware- no fever- states is improving  . Sleep apnea    settings 10.6  / followed by [name] study years ago  . Tremor    d/t lithium    PSH: Past Surgical History:  Procedure Laterality Date  . CARDIAC CATHETERIZATION     2008 pre op gastric bypass  . COLONOSCOPY W/ POLYPECTOMY    . 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 JOINT REPLACEMENT     Left, Right Knee, Right hip  . KNEE ARTHROSCOPY     right x 3-4, left x 2  . KNEE ARTHROTOMY  ~1991   right  . LUMBAR FUSION  04/03/2013   [name], L5-S1  . POLYPECTOMY     vocal cords 1992  . 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   Procedure: LEFT TOTAL HIP ARTHROPLASTY ANTERIOR APPROACH;  Surgeon: [name], MD;  Location: [facility];  Service: Orthopedics;  Laterality: Left;  . TOTAL HIP REVISION  04/22/2011   Procedure: TOTAL HIP REVISION;  Surgeon: Gearlean Alf, MD;  Location: [facility];  Service: Orthopedics;  Laterality: Right;    Social History:  reports that he quit smoking about 28 years ago. His smoking use included cigarettes. He has a 30.00 pack-year smoking history. He has never used smokeless tobacco. He reports that he does not drink alcohol  or use drugs.  Allergies:  Allergies  Allergen Reactions  . Breo Ellipta [Fluticasone Furoate-Vilanterol] Other (See Comments)    Thrush  . Demerol [Meperidine] Itching  . Lamotrigine Hives and Itching  . Nsaids Nausea Only    Gastric bypass    Medications: No current facility-administered medications for this encounter.    Current Outpatient Medications  Medication Sig Dispense Refill  . acetaminophen (TYLENOL) 500 MG tablet Take 1,000 mg by mouth daily as needed for headache.    . albuterol (PROVENTIL HFA;VENTOLIN HFA) 108 (90 Base) MCG/ACT inhaler Inhale 2 puffs into the lungs every 6 (six) hours as needed for wheezing or shortness of breath.    . ALPRAZolam (XANAX) 0.5 MG tablet Take 0.5-1 mg by mouth 3 (three) times daily as needed for anxiety. Reported on  05/19/2015    . aspirin EC 81 MG tablet Take 81 mg by mouth daily as needed for mild pain.    . DULoxetine (CYMBALTA) 60 MG capsule Take 2 capsules by mouth daily.  5  . dutasteride (AVODART) 0.5 MG capsule Take 0.5 mg by mouth daily.    . fluticasone (FLONASE) 50 MCG/ACT nasal spray Place 2 sprays into the nose 2 (two) times daily.    Marland Kitchen HYDROcodone-acetaminophen (NORCO) 10-325 MG per tablet Take 1-2 tablets by mouth every 6 (six) hours as needed for moderate pain. (Patient taking differently: Take 1 tablet by mouth as needed for moderate pain. ) 90 tablet 0  . metFORMIN (GLUCOPHAGE) 500 MG tablet Take 1,000 mg by mouth 2 (two) times daily with a meal.    . methocarbamol (ROBAXIN) 500 MG tablet Take 500 mg by mouth 3 (three) times daily as needed.    . Multiple Vitamins-Minerals (CENTRUM SILVER ADULT 50+ PO) Take 1 tablet by mouth daily.    . pioglitazone (ACTOS) 45 MG tablet Take 45 mg by mouth daily.     . pravastatin (PRAVACHOL) 40 MG tablet Take 40 mg by mouth daily.    . traMADol (ULTRAM) 50 MG tablet TAKE 1 TABLET BY MOUTH AS NEEDED  2  . UNABLE TO FIND Med Name: CPAP 10 cm      Results for orders placed or performed in visit on 02/04/19 (from the past 48 hour(s))  CMP ([place] only)     Status: Abnormal   Collection Time: 02/04/19  8:54 AM  Result Value Ref Range   Sodium 141 135 - 145 mmol/L   Potassium 4.7 3.5 - 5.1 mmol/L   Chloride 105 98 - 111 mmol/L   CO2 28 22 - 32 mmol/L   Glucose, Bld 133 (H) 70 - 99 mg/dL   BUN 15 8 - 23 mg/dL   Creatinine 0.85 0.61 - 1.24 mg/dL   Calcium 9.3 8.9 - 10.3 mg/dL   Total Protein 6.8 6.5 - 8.1 g/dL   Albumin 3.9 3.5 - 5.0 g/dL   AST 21 15 - 41 U/L   ALT 19 0 - 44 U/L   Alkaline Phosphatase 53 38 - 126 U/L   Total Bilirubin 0.4 0.3 - 1.2 mg/dL   GFR, Est Non Af Am >60 >60 mL/min   GFR, Est AFR Am >60 >60 mL/min   Anion gap 8 5 - 15    Comment: Performed at [facility], [address], [place], [place]  [address]  [facility])     Status: None   Collection Time: 02/04/19  8:54 AM  Result Value Ref Range   Smear Review  SMEAR STAINED AND AVAILABLE FOR REVIEW     Comment: Performed at [facility], [place] [address], [place], [place] [address]  CBC with Differential ([place] Only)     Status: Abnormal   Collection Time: 02/04/19  8:54 AM  Result Value Ref Range   WBC Count 5.0 4.0 - 10.5 K/uL   RBC 3.99 (L) 4.22 - 5.81 MIL/uL   Hemoglobin 12.1 (L) 13.0 - 17.0 g/dL   HCT 37.1 (L) 39.0 - 52.0 %   MCV 93.0 80.0 - 100.0 fL   MCH 30.3 26.0 - 34.0 pg   MCHC 32.6 30.0 - 36.0 g/dL   RDW 13.1 11.5 - 15.5 %   Platelet Count 203 150 - 400 K/uL   nRBC 0.0 0.0 - 0.2 %   Neutrophils Relative % 53 %   Neutro Abs 2.6 1.7 - 7.7 K/uL   Lymphocytes Relative 30 %   Lymphs Abs 1.5 0.7 - 4.0 K/uL   Monocytes Relative 10 %   Monocytes Absolute 0.5 0.1 - 1.0 K/uL   Eosinophils Relative 6 %   Eosinophils Absolute 0.3 0.0 - 0.5 K/uL   Basophils Relative 1 %   Basophils Absolute 0.1 0.0 - 0.1 K/uL   Immature Granulocytes 0 %   Abs Immature Granulocytes 0.01 0.00 - 0.07 K/uL    Comment: Performed at [facility], [address], [place], [place] [address]  Lactate dehydrogenase (LDH)     Status: Abnormal   Collection Time: 02/04/19  8:54 AM  Result Value Ref Range   LDH 277 (H) 98 - 192 U/L    Comment: Performed at [facility], [place] [address] [place], [place] [address]  Folate, Serum     Status: None   Collection Time: 02/04/19  8:54 AM  Result Value Ref Range   Folate 15.6 >5.9 ng/mL    Comment: Performed at [facility], [place] [address] [place], [place] [address]  Vitamin B12     Status: Abnormal   Collection Time: 02/04/19  8:54 AM  Result Value Ref Range   Vitamin B-12 5,204 (H) 180 - 914 pg/mL    Comment: RESULTS CONFIRMED BY MANUAL DILUTION (NOTE) This assay is not validated for testing  neonatal or myeloproliferative syndrome specimens for Vitamin B12 levels. Performed at [facility], [place] [address] [place], [place] [address]   Retic Panel     Status: Abnormal   Collection Time: 02/04/19  8:54 AM  Result Value Ref Range   Retic Ct Pct 1.2 0.4 - 3.1 %   RBC. 3.99 (L) 4.22 - 5.81 MIL/uL   Retic Count, Absolute 47.5 19.0 - 186.0 K/uL   Immature Retic Fract 5.4 2.3 - 15.9 %   Reticulocyte Hemoglobin 35.4 >27.9 pg    Comment:        Given the high negative predictive value of a RET-He result > 32 pg iron deficiency is essentially excluded. If this patient is anemic other etiologies should be considered. Performed at [facility], [place] [address], [place], [place] [address]   Iron and TIBC     Status: None   Collection Time: 02/04/19  8:54 AM  Result Value Ref Range   Iron 72 42 - 163 ug/dL   TIBC 311 202 - 409 ug/dL   Saturation Ratios 23 20 - 55 %   UIBC 240 117 - 376 ug/dL    Comment: Performed at [facility], [place] [address], [place], [place] [address]   No results found.  ROS: Pain with rom of the right upper extremity  Physical Exam: Alert and oriented 73 y.o. male in no acute distress Cranial nerves 2-12 intact Cervical spine: full rom with no tenderness, nv intact distally Chest: active breath sounds bilaterally, no wheeze rhonchi or rales Heart: regular rate and rhythm, no murmur Abd: non tender non distended with active bowel sounds Hip is stable with rom  Right shoulder painful rom Weakness with ER and IR No rashes or edema distally  Assessment/Plan Assessment: right shoulder cuff arthropathy  Plan:  Patient will undergo a right reverse total shoulder by Dr. Veverly Fells at [facility]. Risks benefits and expectations were discussed with the patient. Patient understand risks, benefits and expectations and wishes to proceed. Preoperative templating of the joint replacement has  been completed, documented, and submitted to the Operating Room personnel in order to optimize intra-operative equipment management.   Merla Riches PA-C, MPAS [facility] is now [employer] [address], [place], [place], [place] [address] Phone: [phone] [email or web address] Facebook  [employer]

## 2019-02-05 NOTE — Telephone Encounter (Signed)
Spoke with patient to inform him that lab appt for 02/08/19 at 8 am can be cancelled.  Lab was able to get add-ons from extra tube drawn.  Patient voiced understanding and thanks for call.

## 2019-02-06 ENCOUNTER — Other Ambulatory Visit: Payer: Self-pay

## 2019-02-06 ENCOUNTER — Other Ambulatory Visit: Payer: Self-pay | Admitting: *Deleted

## 2019-02-06 ENCOUNTER — Ambulatory Visit (INDEPENDENT_AMBULATORY_CARE_PROVIDER_SITE_OTHER): Payer: Medicare Other | Admitting: Internal Medicine

## 2019-02-06 DIAGNOSIS — D508 Other iron deficiency anemias: Secondary | ICD-10-CM

## 2019-02-06 DIAGNOSIS — J45991 Cough variant asthma: Secondary | ICD-10-CM

## 2019-02-06 DIAGNOSIS — R918 Other nonspecific abnormal finding of lung field: Secondary | ICD-10-CM

## 2019-02-06 LAB — PULMONARY FUNCTION TEST
DL/VA % pred: 86 %
DL/VA: 3.43 ml/min/mmHg/L
DLCO unc % pred: 84 %
DLCO unc: 23.86 ml/min/mmHg
FEF 25-75 Post: 5.89 L/sec
FEF 25-75 Pre: 4.88 L/sec
FEF2575-%Change-Post: 20 %
FEF2575-%Pred-Post: 217 %
FEF2575-%Pred-Pre: 180 %
FEV1-%Change-Post: 3 %
FEV1-%Pred-Post: 120 %
FEV1-%Pred-Pre: 115 %
FEV1-Post: 4.34 L
FEV1-Pre: 4.18 L
FEV1FVC-%Change-Post: 3 %
FEV1FVC-%Pred-Pre: 114 %
FEV6-%Change-Post: 0 %
FEV6-%Pred-Post: 107 %
FEV6-%Pred-Pre: 106 %
FEV6-Post: 4.98 L
FEV6-Pre: 4.94 L
FEV6FVC-%Pred-Post: 105 %
FEV6FVC-%Pred-Pre: 105 %
FVC-%Change-Post: 0 %
FVC-%Pred-Post: 101 %
FVC-%Pred-Pre: 100 %
FVC-Post: 5 L
FVC-Pre: 4.95 L
Post FEV1/FVC ratio: 87 %
Post FEV6/FVC ratio: 100 %
Pre FEV1/FVC ratio: 84 %
Pre FEV6/FVC Ratio: 100 %
RV % pred: 71 %
RV: 1.89 L
TLC % pred: 92 %
TLC: 7.04 L

## 2019-02-06 NOTE — Progress Notes (Signed)
PFT done today. 

## 2019-02-08 ENCOUNTER — Inpatient Hospital Stay: Payer: Medicare Other

## 2019-02-08 ENCOUNTER — Other Ambulatory Visit: Payer: Self-pay

## 2019-02-08 VITALS — BP 104/67 | HR 59 | Temp 97.9°F | Resp 16

## 2019-02-08 DIAGNOSIS — K909 Intestinal malabsorption, unspecified: Secondary | ICD-10-CM | POA: Diagnosis not present

## 2019-02-08 DIAGNOSIS — R5383 Other fatigue: Secondary | ICD-10-CM | POA: Diagnosis not present

## 2019-02-08 DIAGNOSIS — D508 Other iron deficiency anemias: Secondary | ICD-10-CM | POA: Diagnosis not present

## 2019-02-08 DIAGNOSIS — M25519 Pain in unspecified shoulder: Secondary | ICD-10-CM | POA: Diagnosis not present

## 2019-02-08 DIAGNOSIS — R634 Abnormal weight loss: Secondary | ICD-10-CM | POA: Diagnosis not present

## 2019-02-08 DIAGNOSIS — Z9884 Bariatric surgery status: Secondary | ICD-10-CM | POA: Diagnosis not present

## 2019-02-08 MED ORDER — SODIUM CHLORIDE 0.9 % IV SOLN
510.0000 mg | Freq: Once | INTRAVENOUS | Status: AC
  Administered 2019-02-08: 510 mg via INTRAVENOUS
  Filled 2019-02-08: qty 17

## 2019-02-08 MED ORDER — SODIUM CHLORIDE 0.9 % IV SOLN
Freq: Once | INTRAVENOUS | Status: AC
  Administered 2019-02-08: 10:00:00 via INTRAVENOUS
  Filled 2019-02-08: qty 250

## 2019-02-08 NOTE — Progress Notes (Signed)
Please contact the patient and let him know that overall his pulmonary function testing today looks stable.  This is good news.  Patient does need to have follow-up with our office in 2 to 4 weeks with a appointment with Dr. Melvyn Novas to further review these results.  Needs to be with Dr. Melvyn Novas as patient is not seeing Dr. Melvyn Novas since 2018.  Wyn Quaker, FNP

## 2019-02-08 NOTE — Patient Instructions (Signed)

## 2019-02-11 NOTE — Progress Notes (Signed)
Needs follow up with Dr. Melvyn Novas. See last message.   Aaron Edelman

## 2019-02-13 DIAGNOSIS — F3130 Bipolar disorder, current episode depressed, mild or moderate severity, unspecified: Secondary | ICD-10-CM | POA: Diagnosis not present

## 2019-02-15 ENCOUNTER — Other Ambulatory Visit: Payer: Self-pay

## 2019-02-15 ENCOUNTER — Inpatient Hospital Stay: Payer: Medicare Other | Attending: Adult Health

## 2019-02-15 VITALS — BP 98/60 | HR 55 | Temp 98.3°F | Resp 18

## 2019-02-15 DIAGNOSIS — Z9884 Bariatric surgery status: Secondary | ICD-10-CM | POA: Diagnosis not present

## 2019-02-15 DIAGNOSIS — K909 Intestinal malabsorption, unspecified: Secondary | ICD-10-CM | POA: Insufficient documentation

## 2019-02-15 DIAGNOSIS — D508 Other iron deficiency anemias: Secondary | ICD-10-CM | POA: Diagnosis not present

## 2019-02-15 MED ORDER — SODIUM CHLORIDE 0.9 % IV SOLN
Freq: Once | INTRAVENOUS | Status: AC
  Administered 2019-02-15: 09:00:00 via INTRAVENOUS
  Filled 2019-02-15: qty 250

## 2019-02-15 MED ORDER — SODIUM CHLORIDE 0.9 % IV SOLN
510.0000 mg | Freq: Once | INTRAVENOUS | Status: AC
  Administered 2019-02-15: 510 mg via INTRAVENOUS
  Filled 2019-02-15: qty 17

## 2019-02-15 NOTE — Patient Instructions (Signed)

## 2019-02-15 NOTE — Progress Notes (Signed)
Pt declined to stay for 30 minute post observation, VS stable.

## 2019-02-18 ENCOUNTER — Other Ambulatory Visit: Payer: Self-pay

## 2019-02-18 ENCOUNTER — Encounter: Payer: Self-pay | Admitting: Internal Medicine

## 2019-02-18 ENCOUNTER — Ambulatory Visit (INDEPENDENT_AMBULATORY_CARE_PROVIDER_SITE_OTHER): Payer: Medicare Other | Admitting: Internal Medicine

## 2019-02-18 DIAGNOSIS — Z9989 Dependence on other enabling machines and devices: Secondary | ICD-10-CM

## 2019-02-18 DIAGNOSIS — R05 Cough: Secondary | ICD-10-CM | POA: Diagnosis not present

## 2019-02-18 DIAGNOSIS — I2510 Atherosclerotic heart disease of native coronary artery without angina pectoris: Secondary | ICD-10-CM

## 2019-02-18 DIAGNOSIS — J45991 Cough variant asthma: Secondary | ICD-10-CM | POA: Diagnosis not present

## 2019-02-18 DIAGNOSIS — G4733 Obstructive sleep apnea (adult) (pediatric): Secondary | ICD-10-CM

## 2019-02-18 DIAGNOSIS — R058 Other specified cough: Secondary | ICD-10-CM

## 2019-02-18 DIAGNOSIS — R059 Cough, unspecified: Secondary | ICD-10-CM

## 2019-02-18 MED ORDER — ALBUTEROL SULFATE HFA 108 (90 BASE) MCG/ACT IN AERS: 2.0000 | INHALATION_SPRAY | Freq: Four times a day (QID) | RESPIRATORY_TRACT | 3 refills | Status: DC | PRN

## 2019-02-18 NOTE — Progress Notes (Signed)
Subjective:    Patient ID: [name], male    DOB: 1945/04/10   MRN: [id number]    Brief patient profile:  73 yowm quit smoking 1993 with h/o tree allergies age 73 produced seasonal sob and chest tight worse in spring but not requiring maint rx and then onset of same problems starting around 2010 around tgiving and worse symptoms year round since  2013   So self referred  to pulmonary clinic 09/27/2012 for eval of ? Asthma/copd.     History of Present Illness  09/27/2012 1st pulmonary eval / cc  Recurrent chest tightness and sob x 4 years seasonally does fine on cpap x 10 years [place].  Maintained on inderol 40 mg qid and multiple inhalers including laba/lama > no better.  Albuterol helps 10% .  Prednisone helps a lot.  Assoc with mostly dry cough day > night.   Sob is variably present at rest and with activity, much worse when anxious, much worse last 4 weeks. symbicort 80 Take 2 puffs first thing in am and then another 2 puffs about 12 hours later.  Increase prilosec to 20 mg 2 Take 30-60 min before first meal of the day  Wean off the inderal  For cough use delsym 2 tsp every 12 hours and supplement with tramadol 50 mg every 4 hours if needed  GERD diet/   10/11/2012 f/u ov/[name] re cough Chief Complaint  Patient presents with  . Follow-up    Pt states that overall his breathing is much improved since the last visit. He is still clearing his throat, but this is some less since the last visit.   sob completely resolved, not limited by doe from desired activities  rec Work on inhaler technique:   Only use  symbicort 80 2 every 12 hours if you feel you need it for any respiratory flare For throat congestion/ cough try chlortrimeton 4 mg at bedtime and every 6 hours as needed      Sinus CT 11/23/12 > severe sinusitis > rx x 21 days augmentin   12/20/2012 f/u ov/[name] re: chronic cough/ ? All sinus related?  Chief Complaint  Patient presents with  . Follow-up    still  feels like can't take a deep breath,had ct sinus 2 wks. ago,feels pr. in sinus ,pnd,cough-dry occass,feels like something in throat,sob slightly better,occass. wheezing,mid chest tightness occass.,wants flu shot if ok  rec Please see patient coordinator before you leave today  to schedule ent eval> [facility] declined surgery  Once your cough and breathing have normalized you can try tapering off the symbicort 80 but restart immediately if worsen off it> stopped it and made no difference   11/23/2016  Extended f/u ov/[name] re: uacs/ chronic rhinitis no maint rx other than flonase  Chief Complaint  Patient presents with  . Follow-up    nasal congestion x years/sporadic  March and April ar the worst time for nasal congestion/assoc wheeze  but also peaks in June and late August x decades Assoc chest tightness resolves with xanax and or  mucinex but does correlate with nasal congestion Better 50% mucinex Better p 75% albuterol last used 6 m prior to OV   rec Nasal steroids (flonase)  have no immediate benefit in terms of improving symptoms.     02/18/2019  f/u ov/[name] re: chronic rhinitis/sinusitis  Chief Complaint  Patient presents with  . Follow-up    last seen by MW on 11/2016.  PFT on 11/25, in [place].  Dyspnea:  MMRC1 = can walk nl pace, flat grade, can't hurry or go uphills or steps s sob  / limited by knees hips esp steps  Cough: none  Sleeping: none on cpap / 30 degrees  SABA use: none  02: none  Xanax helps the breathing   No obvious day to day or daytime variability or assoc excess/ purulent sputum or mucus plugs or hemoptysis or cp or chest tightness, subjective wheeze or overt sinus or hb symptoms.   Sleeping as above  without nocturnal  or early am exacerbation  of respiratory  c/o's or need for noct saba. Also denies any obvious fluctuation of symptoms with weather or environmental changes or other aggravating or alleviating factors except as outlined above   No unusual exposure hx  or h/o childhood pna/ asthma or knowledge of premature birth.  Current Allergies, Complete Past Medical History, Past Surgical History, Family History, and Social History were reviewed in [employer] record.  ROS  The following are not active complaints unless bolded Hoarseness, sore throat, dysphagia, dental problems, itching, sneezing,  nasal congestion or discharge of excess mucus or purulent secretions, ear ache,   fever, chills, sweats, unintended wt loss or wt gain, classically pleuritic or exertional cp,  orthopnea pnd or arm/hand swelling  or leg swelling, presyncope, palpitations, abdominal pain, anorexia, nausea, vomiting, diarrhea  or change in bowel habits or change in bladder habits, change in stools or change in urine, dysuria, hematuria,  rash, arthralgias, visual complaints, headache, numbness, weakness or ataxia or problems with walking or coordination,  change in mood or  memory.        Current Meds  Medication Sig  . acetaminophen (TYLENOL) 500 MG tablet Take 1,000 mg by mouth daily as needed for headache.  . albuterol (PROVENTIL HFA;VENTOLIN HFA) 108 (90 Base) MCG/ACT inhaler Inhale 2 puffs into the lungs every 6 (six) hours as needed for wheezing or shortness of breath.  . ALPRAZolam (XANAX) 0.5 MG tablet Take 0.5-1 mg by mouth 3 (three) times daily as needed for anxiety. Reported on 05/19/2015  . aspirin EC 81 MG tablet Take 81 mg by mouth daily as needed for mild pain.  . DULoxetine (CYMBALTA) 60 MG capsule Take 2 capsules by mouth daily.  Marland Kitchen dutasteride (AVODART) 0.5 MG capsule Take 0.5 mg by mouth daily.  . fluticasone (FLONASE) 50 MCG/ACT nasal spray Place 2 sprays into the nose 2 (two) times daily.  Marland Kitchen HYDROcodone-acetaminophen (NORCO) 10-325 MG per tablet Take 1-2 tablets by mouth every 6 (six) hours as needed for moderate pain. (Patient taking differently: Take 1 tablet by mouth as needed for moderate pain. )  . metFORMIN (GLUCOPHAGE) 500 MG tablet  Take 1,000 mg by mouth 2 (two) times daily with a meal.  . methocarbamol (ROBAXIN) 500 MG tablet Take 500 mg by mouth 3 (three) times daily as needed.  . Multiple Vitamins-Minerals (CENTRUM SILVER ADULT 50+ PO) Take 1 tablet by mouth daily.  . pioglitazone (ACTOS) 45 MG tablet Take 45 mg by mouth daily.   . pravastatin (PRAVACHOL) 40 MG tablet Take 40 mg by mouth daily.  . traMADol (ULTRAM) 50 MG tablet TAKE 1 TABLET BY MOUTH AS NEEDED  . UNABLE TO FIND Med Name: CPAP 10 cm                        Objective:   Physical Exam  amb wm nad      02/18/2019  213 11/23/2016       254  12/20/2012       266  11/22/2012       259  10/11/2012       254     09/27/12 249 lb (112.946 kg)  04/22/11 257 lb 15 oz (117 kg)  04/22/11 257 lb 15 oz (117 kg)    Vital signs reviewed - Note on arrival 02 sats  100% on RA     HEENT : pt wearing mask not removed for exam due to covid -19 concerns.    NECK :  without JVD/Nodes/TM/ nl carotid upstrokes bilaterally   LUNGS: no acc muscle use,  Nl contour chest which is clear to A and P bilaterally without cough on insp or exp maneuvers   CV:  RRR  no s3 or murmur or increase in P2, and no edema   ABD:  soft and nontender with nl inspiratory excursion in the supine position. No bruits or organomegaly appreciated, bowel sounds nl  MS:  Nl gait/ ext warm without deformities, calf tenderness, cyanosis or clubbing No obvious joint restrictions   SKIN: warm and dry without lesions    NEURO:  alert, approp, nl sensorium with  no motor or cerebellar deficits apparent.                          Assessment & Plan:

## 2019-02-18 NOTE — Patient Instructions (Signed)
Your lung function is still normal   If your breathing continues to bother you I would consider another ENT opinion but defer this to Dr Felipa Eth    Pulmonary follow up is as needed  - contact Dr Elsworth Soho for any issues related to CPAP machine

## 2019-02-20 ENCOUNTER — Encounter: Payer: Self-pay | Admitting: Internal Medicine

## 2019-02-20 DIAGNOSIS — M4316 Spondylolisthesis, lumbar region: Secondary | ICD-10-CM | POA: Diagnosis not present

## 2019-02-20 DIAGNOSIS — R05 Cough: Secondary | ICD-10-CM | POA: Insufficient documentation

## 2019-02-20 DIAGNOSIS — M47816 Spondylosis without myelopathy or radiculopathy, lumbar region: Secondary | ICD-10-CM | POA: Diagnosis not present

## 2019-02-20 DIAGNOSIS — R059 Cough, unspecified: Secondary | ICD-10-CM | POA: Insufficient documentation

## 2019-02-20 NOTE — Assessment & Plan Note (Addendum)
Onset 2014  FENO 11/23/2016  =   40 off all rx - Spirometry 11/23/2016  FEV1 4.06 (106%)  Ratio 82 with min curvature  Off all rx  02/06/2019-pulmonary function test-FVC 4.95 (100% predicted), postbronchodilator ratio 87, postbronchodilator FEV1 4.34 (120% predicted), no positive bronchodilator response, DLCO 23.86 (84% predicted), normal flow volume loops  Reviewed pfts in detail with pt   Pattern of symptoms is chronic s tendency to variability/ flares and likely mostly upper airway so f/u here can be prn

## 2019-02-20 NOTE — Assessment & Plan Note (Signed)
Dxed 1996 and fu By Dr Elsworth Soho   08/03/1995 -sleep study: RDI - 50 Titration study 2008 with optimal pressure 14cm. S/p bariatric surgery.  Auto 11/2012: optimal pressure 10cm - 11/30/2015  Using > 4h 100% with autoset 7 and ahi 3.6   Adequate control on present rx, reviewed in detail with pt > no change in rx needed  / f/u Dr Elsworth Soho prn

## 2019-02-20 NOTE — Assessment & Plan Note (Signed)
-  Sinus CT 11/23/12 Nearly pansinus paranasal sinus inflammatory changes with fluid levels and bubbly opacity most compatible with acute sinusitis. - [facility]  eval 2014 rec surgery > declined - Allergy profile 11/23/2016 >  Eos 0.4 /  IgE  29  RAST neg  He continues to have somewhat atypical symptoms but still stronlgy support dx of uacs  Upper airway cough syndrome (previously labeled PNDS),  is so named because it's frequently impossible to sort out how much is  CR/sinusitis with freq throat clearing (which can be related to primary GERD)   vs  causing  secondary (" extra esophageal")  GERD from wide swings in gastric pressure that occur with throat clearing, often  promoting self use of mint and menthol lozenges that reduce the lower esophageal sphincter tone and exacerbate the problem further in a cyclical fashion.   These are the same pts (now being labeled as having "irritable larynx syndrome" by some cough centers) who not infrequently have a history of having failed to tolerate ace inhibitors,  dry powder inhalers or biphosphonates or report having atypical/extraesophageal reflux symptoms that don't respond to standard doses of PPI  and are easily confused as having aecopd or asthma flares by even experienced allergists/ pulmonologists (myself included).   rec ent f/u prn/ no further pulmonary f/u needed   I had an extended discussion with the patient reviewing all relevant studies completed to date and  lasting 15 to 20 minutes of a 25 minute visit    Each maintenance medication was reviewed in detail including most importantly the difference between maintenance and prns and under what circumstances the prns are to be triggered using an action plan format that is not reflected in the computer generated alphabetically organized AVS.     Please see AVS for specific instructions unique to this visit that I personally wrote and verbalized to the the pt in detail and then reviewed with pt  by my  nurse highlighting any  changes in therapy recommended at today's visit to their plan of care.

## 2019-02-21 DIAGNOSIS — Z79891 Long term (current) use of opiate analgesic: Secondary | ICD-10-CM | POA: Diagnosis not present

## 2019-02-21 DIAGNOSIS — Z79899 Other long term (current) drug therapy: Secondary | ICD-10-CM | POA: Diagnosis not present

## 2019-02-22 ENCOUNTER — Encounter (HOSPITAL_COMMUNITY): Admission: RE | Payer: Self-pay | Source: Home / Self Care

## 2019-02-22 ENCOUNTER — Inpatient Hospital Stay (HOSPITAL_COMMUNITY): Admission: RE | Admit: 2019-02-22 | Payer: Medicare Other | Source: Home / Self Care | Admitting: Orthopedic Surgery

## 2019-02-22 SURGERY — ARTHROPLASTY, SHOULDER, TOTAL, REVERSE
Anesthesia: General | Site: Shoulder | Laterality: Right

## 2019-04-08 DIAGNOSIS — F3130 Bipolar disorder, current episode depressed, mild or moderate severity, unspecified: Secondary | ICD-10-CM | POA: Diagnosis not present

## 2019-04-09 ENCOUNTER — Ambulatory Visit: Payer: Medicare Other

## 2019-04-18 ENCOUNTER — Ambulatory Visit: Payer: Medicare Other

## 2019-04-22 DIAGNOSIS — J0111 Acute recurrent frontal sinusitis: Secondary | ICD-10-CM | POA: Diagnosis not present

## 2019-04-30 ENCOUNTER — Ambulatory Visit: Payer: Medicare Other

## 2019-04-30 ENCOUNTER — Inpatient Hospital Stay: Payer: Medicare Other | Attending: Adult Health

## 2019-05-02 ENCOUNTER — Telehealth: Payer: Self-pay | Admitting: Oncology

## 2019-05-02 NOTE — Telephone Encounter (Signed)
Rescheduled per 2/16 sch msg, pt req. Called and left a msg. Mailing printout

## 2019-05-07 ENCOUNTER — Ambulatory Visit: Payer: Medicare Other | Admitting: Oncology

## 2019-05-13 ENCOUNTER — Other Ambulatory Visit: Payer: Self-pay | Admitting: Oncology

## 2019-05-13 ENCOUNTER — Other Ambulatory Visit: Payer: Self-pay

## 2019-05-13 ENCOUNTER — Inpatient Hospital Stay: Payer: Medicare Other | Attending: Adult Health

## 2019-05-13 ENCOUNTER — Encounter: Payer: Self-pay | Admitting: Oncology

## 2019-05-13 DIAGNOSIS — D508 Other iron deficiency anemias: Secondary | ICD-10-CM | POA: Diagnosis not present

## 2019-05-13 DIAGNOSIS — Z87891 Personal history of nicotine dependence: Secondary | ICD-10-CM | POA: Diagnosis not present

## 2019-05-13 DIAGNOSIS — Z7982 Long term (current) use of aspirin: Secondary | ICD-10-CM | POA: Diagnosis not present

## 2019-05-13 DIAGNOSIS — Z9884 Bariatric surgery status: Secondary | ICD-10-CM | POA: Insufficient documentation

## 2019-05-13 DIAGNOSIS — N400 Enlarged prostate without lower urinary tract symptoms: Secondary | ICD-10-CM | POA: Insufficient documentation

## 2019-05-13 DIAGNOSIS — M25511 Pain in right shoulder: Secondary | ICD-10-CM | POA: Insufficient documentation

## 2019-05-13 DIAGNOSIS — Z77090 Contact with and (suspected) exposure to asbestos: Secondary | ICD-10-CM | POA: Insufficient documentation

## 2019-05-13 DIAGNOSIS — K909 Intestinal malabsorption, unspecified: Secondary | ICD-10-CM | POA: Insufficient documentation

## 2019-05-13 DIAGNOSIS — E119 Type 2 diabetes mellitus without complications: Secondary | ICD-10-CM | POA: Diagnosis not present

## 2019-05-13 DIAGNOSIS — Z79899 Other long term (current) drug therapy: Secondary | ICD-10-CM | POA: Insufficient documentation

## 2019-05-13 DIAGNOSIS — F418 Other specified anxiety disorders: Secondary | ICD-10-CM | POA: Insufficient documentation

## 2019-05-13 LAB — CBC WITH DIFFERENTIAL (CANCER CENTER ONLY)
Abs Immature Granulocytes: 0.01 10*3/uL (ref 0.00–0.07)
Basophils Absolute: 0.1 10*3/uL (ref 0.0–0.1)
Basophils Relative: 1 %
Eosinophils Absolute: 0.3 10*3/uL (ref 0.0–0.5)
Eosinophils Relative: 6 %
HCT: 38.8 % — ABNORMAL LOW (ref 39.0–52.0)
Hemoglobin: 12.5 g/dL — ABNORMAL LOW (ref 13.0–17.0)
Immature Granulocytes: 0 %
Lymphocytes Relative: 34 %
Lymphs Abs: 1.9 10*3/uL (ref 0.7–4.0)
MCH: 30.2 pg (ref 26.0–34.0)
MCHC: 32.2 g/dL (ref 30.0–36.0)
MCV: 93.7 fL (ref 80.0–100.0)
Monocytes Absolute: 0.5 10*3/uL (ref 0.1–1.0)
Monocytes Relative: 10 %
Neutro Abs: 2.7 10*3/uL (ref 1.7–7.7)
Neutrophils Relative %: 49 %
Platelet Count: 198 10*3/uL (ref 150–400)
RBC: 4.14 MIL/uL — ABNORMAL LOW (ref 4.22–5.81)
RDW: 13.3 % (ref 11.5–15.5)
WBC Count: 5.5 10*3/uL (ref 4.0–10.5)
nRBC: 0 % (ref 0.0–0.2)

## 2019-05-13 LAB — IRON AND TIBC
Iron: 80 ug/dL (ref 42–163)
Saturation Ratios: 32 % (ref 20–55)
TIBC: 255 ug/dL (ref 202–409)
UIBC: 175 ug/dL (ref 117–376)

## 2019-05-13 LAB — FERRITIN: Ferritin: 124 ng/mL (ref 24–336)

## 2019-05-19 NOTE — Progress Notes (Signed)
[place]  [phone] [phone]) [phone]     ID: [name]. DOB: 10-17-1945  MR#: [id number]  [id number]  Patient Care Team: Lajean Manes, MD as PCP - General (Internal Medicine) Cleone Hulick, Virgie Dad, MD as Consulting Physician (Oncology) Chauncey Cruel, MD OTHER MD:  CHIEF COMPLAINT: iron deficiency  CURRENT TREATMENT: oral iron   INTERVAL HISTORY: [name] returns today for follow up of his iron deficiency accompanied by his wife.  He received feraheme on 02/08/2019 and 02/15/2019.  He tolerated these well and his ferritin has nicely improved and remains adequate at 124.  His hemoglobin is up to 12.5.   REVIEW OF SYSTEMS: [name] and his wife both have received the second dose of the COVID-19 vaccine, on 05/14/2019.  They had no significant side effects from that.  What bothers [name] the most is his right shoulder which is extremely painful and which he tells me needs to undergo extensive surgery.  His lungs are "fair" and he worries because he has a history of asbestos exposure.  There has been no recent cough or worsening shortness of breath.  He continues to worry about losing weight.  A detailed review of systems today was otherwise stable.    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 He went from 350 to 222 pounds, then increased back up to about 245-250 and stayed there since 2014.  He notes that since 2014 he has been told that he has anemia and iron deficiency.  He has been taking iron supplementation which has been constipating for him, particularly since he is taking Hydrocodone for his knee, hip, and shoulder pain.  He underwent colonoscopy in 2016 and FOBT last in 2019 with no evidence of cancer.    [name] notes since February or March of this year he has lost about 40 pounds.  He says that he underwent his upper teeth dental extractions and had swollen  gums that took 10-12 weeks to heal to make denture implants.  The dentures he received in September of this year.  During the time of swollen gums, he ate minimal food, mainly soft, and just enough to satiate his appetite.    The patient's subsequent history is as detailed below.   PAST MEDICAL HISTORY: Past Medical History:  Diagnosis Date  . Arthritis   . Asthma    anxiety, tree/grass pollen  . BPH (benign prostatic hyperplasia)   . Bronchitis    hx of  . Bulge of cervical disc without myelopathy   . Chest pain    a. 06/2015 MV: EF 56%, no ischemia/infarct.  . Complication of anesthesia [id number]   no complications but just says he typically needs "more than expected" to anesthetize; needs CPAP (setting 10) in recovery  . Depression    ADD  . Diabetes mellitus    PCP [name], type 2  . Insomnia   . Mitral valve prolapse    a. 06/2015 Echo: EF 65-70%, mild LVH, no rwma, mild MVP of ant leaflet and mild to mod posteriorly directed MR, mildly dil La.  . Obesity   . PVC's (premature ventricular contractions)   . Recurrent upper respiratory infection (URI)    states Dr Wynelle Link aware- no fever- states is improving  . Sleep apnea    settings 10.6  / followed by [name] study years ago  . Tremor    d/t lithium  PAST SURGICAL HISTORY: Past Surgical History:  Procedure Laterality Date  . CARDIAC CATHETERIZATION     2008 pre op gastric bypass  . COLONOSCOPY W/ POLYPECTOMY    . 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 JOINT REPLACEMENT     Left, Right Knee, Right hip  . KNEE ARTHROSCOPY     right x 3-4, left x 2  . KNEE ARTHROTOMY  ~1991   right  . LUMBAR FUSION  04/03/2013   [name], L5-S1  . POLYPECTOMY     vocal cords 1992  . 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   Procedure: LEFT TOTAL HIP ARTHROPLASTY ANTERIOR APPROACH;  Surgeon: [name], MD;  Location: [facility];  Service: Orthopedics;  Laterality: Left;  . TOTAL HIP REVISION  04/22/2011   Procedure: TOTAL HIP  REVISION;  Surgeon: Gearlean Alf, MD;  Location: [facility];  Service: Orthopedics;  Laterality: Right;    FAMILY HISTORY Family History  Problem Relation Age of Onset  . Heart disease Father   . Anxiety disorder Mother   . Asthma Maternal Grandmother     SOCIAL HISTORY:  [name] is married and lives with his wife in [place], [place].  His sister in law visits from time to time.  He is retired, as of 07/2012.  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      ADVANCED DIRECTIVES: Not in place, planning to complete.     HEALTH MAINTENANCE: Social History   Tobacco Use  . Smoking status: Former Smoker    Packs/day: 1.50    Years: 20.00    Pack years: 30.00    Types: Cigarettes    Quit date: 04/17/1990    Years since quitting: 29.1  . Smokeless tobacco: Never Used  Substance Use Topics  . Alcohol use: No  . Drug use: No     Colonoscopy:  PAP:  Bone density:   Allergies  Allergen Reactions  . Breo Ellipta [Fluticasone Furoate-Vilanterol] Other (See Comments)    Thrush  . Demerol [Meperidine] Itching  . Lamotrigine Hives and Itching  . Nsaids Nausea Only    Gastric bypass    Current Outpatient Medications  Medication Sig Dispense Refill  . acetaminophen (TYLENOL) 500 MG tablet Take 1,000 mg by mouth daily as needed for headache.    . albuterol (PROVENTIL HFA;VENTOLIN HFA) 108 (90 Base) MCG/ACT inhaler Inhale 2 puffs into the lungs every 6 (six) hours as needed for wheezing or shortness of breath.    Marland Kitchen albuterol (VENTOLIN HFA) 108 (90 Base) MCG/ACT inhaler Inhale 2 puffs into the lungs every 6 (six) hours as needed for wheezing or shortness of breath. 18 g 3  . ALPRAZolam (XANAX) 0.5 MG tablet Take 0.5-1 mg by mouth 3 (three) times daily as needed for anxiety. Reported on 05/19/2015    . aspirin EC 81 MG tablet Take 81 mg by mouth daily as needed for mild pain.    . DULoxetine (CYMBALTA) 60 MG capsule Take 2 capsules by mouth daily.  5  .  dutasteride (AVODART) 0.5 MG capsule Take 0.5 mg by mouth daily.    . fluticasone (FLONASE) 50 MCG/ACT nasal spray Place 2 sprays into the nose 2 (two) times daily.    Marland Kitchen HYDROcodone-acetaminophen (NORCO) 10-325 MG per tablet Take 1-2 tablets by mouth every 6 (six) hours as needed for moderate pain. (Patient taking differently: Take 1 tablet by mouth as needed for moderate pain. ) 90 tablet 0  .  metFORMIN (GLUCOPHAGE) 500 MG tablet Take 1,000 mg by mouth 2 (two) times daily with a meal.    . methocarbamol (ROBAXIN) 500 MG tablet Take 500 mg by mouth 3 (three) times daily as needed.    . Multiple Vitamins-Minerals (CENTRUM SILVER ADULT 50+ PO) Take 1 tablet by mouth daily.    . pioglitazone (ACTOS) 45 MG tablet Take 45 mg by mouth daily.     . pravastatin (PRAVACHOL) 40 MG tablet Take 40 mg by mouth daily.    . traMADol (ULTRAM) 50 MG tablet TAKE 1 TABLET BY MOUTH AS NEEDED  2  . UNABLE TO FIND Med Name: CPAP 10 cm     No current facility-administered medications for this visit.    OBJECTIVE: Middle-aged white [name] in no acute distress  Vitals:   05/20/19 1405  BP: (!) 124/54  Pulse: 67  Resp: 18  Temp: 98 F (36.7 C)  SpO2: 100%     Body mass index is 26.37 kg/m.   Wt Readings from Last 3 Encounters:  05/20/19 205 lb 6.4 oz (93.2 kg)  02/18/19 213 lb (96.6 kg)  02/04/19 213 lb 3.2 oz (96.7 kg)  ECOG FS:1 - Symptomatic but completely ambulatory  Sclerae unicteric, EOMs intact Wearing a mask No cervical or supraclavicular adenopathy Lungs no rales or rhonchi Heart in bigeminy, no murmurs  Abd soft, nontender, positive bowel sounds MSK severely limited range of motion right upper extremity Neuro: nonfocal, well oriented, appropriate affect   LAB RESULTS:  CMP     Component Value Date/Time   NA 141 02/04/2019 0854   K 4.7 02/04/2019 0854   CL 105 02/04/2019 0854   CO2 28 02/04/2019 0854   GLUCOSE 133 (H) 02/04/2019 0854   BUN 15 02/04/2019 0854   CREATININE 0.85  02/04/2019 0854   CALCIUM 9.3 02/04/2019 0854   PROT 6.8 02/04/2019 0854   ALBUMIN 3.9 02/04/2019 0854   AST 21 02/04/2019 0854   ALT 19 02/04/2019 0854   ALKPHOS 53 02/04/2019 0854   BILITOT 0.4 02/04/2019 0854   GFRNONAA >60 02/04/2019 0854   GFRAA >60 02/04/2019 0854    No results found for: Ronnald Ramp, A1GS, A2GS, BETS, BETA2SER, GAMS, MSPIKE, SPEI  No results found for: Nils Pyle, [facility]  Lab Results  Component Value Date   WBC 5.5 05/13/2019   NEUTROABS 2.7 05/13/2019   HGB 12.5 (L) 05/13/2019   HCT 38.8 (L) 05/13/2019   MCV 93.7 05/13/2019   PLT 198 05/13/2019      Chemistry      Component Value Date/Time   NA 141 02/04/2019 0854   K 4.7 02/04/2019 0854   CL 105 02/04/2019 0854   CO2 28 02/04/2019 0854   BUN 15 02/04/2019 0854   CREATININE 0.85 02/04/2019 0854      Component Value Date/Time   CALCIUM 9.3 02/04/2019 0854   ALKPHOS 53 02/04/2019 0854   AST 21 02/04/2019 0854   ALT 19 02/04/2019 0854   BILITOT 0.4 02/04/2019 0854      No results found for: LABCA2  No components found for: [id number]  No results for input(s): INR in the last 168 hours.  No results found for: LABCA2  No results found for: [id number]  No results found for: [id number]  No results found for: [id number]  No results found for: CA2729  No components found for: HGQUANT  No results found for: CEA1 / No results found for: CEA1   No results found for: AFPTUMOR  No results  found for: CHROMOGRNA  No results found for: PSA1  No results found for: HGBA, HGBA2QUANT, HGBFQUANT, HGBSQUAN (Hemoglobinopathy evaluation)   Lab Results  Component Value Date   LDH 277 (H) 02/04/2019    Lab Results  Component Value Date   IRON 80 05/13/2019   TIBC 255 05/13/2019   IRONPCTSAT 32 05/13/2019   (Iron and TIBC)  Lab Results  Component Value Date   FERRITIN 124 05/13/2019    Urinalysis    Component Value Date/Time   COLORURINE AMBER (A)  10/14/2014 0944   APPEARANCEUR CLOUDY (A) 10/14/2014 0944   LABSPEC 1.023 10/14/2014 0944   PHURINE 5.0 10/14/2014 0944   GLUCOSEU 500 (A) 10/14/2014 0944   HGBUR LARGE (A) 10/14/2014 0944   BILIRUBINUR NEGATIVE 10/14/2014 0944   KETONESUR NEGATIVE 10/14/2014 0944   PROTEINUR 30 (A) 10/14/2014 0944   UROBILINOGEN 0.2 10/14/2014 0944   NITRITE NEGATIVE 10/14/2014 0944   LEUKOCYTESUR LARGE (A) 10/14/2014 0944    STUDIES: No results found.   ASSESSMENT: 74 y.o. [place] [name], with malabsorptive iron deficiency  1. S/p gastric bypass in 2008  2. Ferritin most recently 15 in 01/2019  3.  Status post Feraheme x 2 weeks on 02/08/2019 and 02/15/2019  (a) on 05/20/2019 hemoglobin is 12.5, MCV 93.7, ferritin 124, iron saturation 32%   PLAN: [name] continues to have a nice response to the iron supplementation he received last year.  I do not think his hemoglobin will go much beyond the current 12.5 since he has significant diabetes issues.  Most of my patients with diabetes under the best of circumstances have a hemoglobin in the 11.5 range.  He was extremely worried that the monocyte percentage was 10, even though that is in the normal range.  He did look this up and of course if the absolute monocyte count is 10 as opposed to the relative count that can be alarming but that is not a situation.  He was reassured by this discussion.  He has good follow-up through cardiology pulmonary gerontology and GI as well of course as orthopedics.  Accordingly we discussed the possibility of discharging from follow-up here, but he tells me he feels reassured by having his labs followed on a regular basis and we can operationalize not for him easily enough.  Accordingly he will have repeat labs in July and November and see me again March of next year.  We will follow his hemoglobin, MCV and iron studies.  He knows to call for any other issue that may develop before the next visit.  Total encounter  time 25 minutes.Sarajane Jews C. Brittanie Dosanjh, MD  Medical Oncology and Hematology [facility] [place], [place] [address] Tel. [phone]    Fax. [phone]   I, Wilburn Mylar, am acting as scribe for Dr. Virgie Dad. [name].  I, Lurline Del MD, have reviewed the above documentation for accuracy and completeness, and I agree with the above.    *Total Encounter Time as defined by the Centers for Medicare and Medicaid Services includes, in addition to the face-to-face time of a patient visit (documented in the note above) non-face-to-face time: obtaining and reviewing outside history, ordering and reviewing medications, tests or procedures, care coordination (communications with other health care professionals or caregivers) and documentation in the medical record.

## 2019-05-20 ENCOUNTER — Other Ambulatory Visit: Payer: Self-pay

## 2019-05-20 ENCOUNTER — Inpatient Hospital Stay (HOSPITAL_BASED_OUTPATIENT_CLINIC_OR_DEPARTMENT_OTHER): Payer: Medicare Other | Admitting: Oncology

## 2019-05-20 VITALS — BP 124/54 | HR 67 | Temp 98.0°F | Resp 18 | Ht 74.0 in | Wt 205.4 lb

## 2019-05-20 DIAGNOSIS — N400 Enlarged prostate without lower urinary tract symptoms: Secondary | ICD-10-CM | POA: Diagnosis not present

## 2019-05-20 DIAGNOSIS — K909 Intestinal malabsorption, unspecified: Secondary | ICD-10-CM | POA: Diagnosis not present

## 2019-05-20 DIAGNOSIS — E119 Type 2 diabetes mellitus without complications: Secondary | ICD-10-CM

## 2019-05-20 DIAGNOSIS — M25511 Pain in right shoulder: Secondary | ICD-10-CM | POA: Diagnosis not present

## 2019-05-20 DIAGNOSIS — D508 Other iron deficiency anemias: Secondary | ICD-10-CM | POA: Diagnosis not present

## 2019-05-20 DIAGNOSIS — Z9884 Bariatric surgery status: Secondary | ICD-10-CM | POA: Diagnosis not present

## 2019-05-21 ENCOUNTER — Telehealth: Payer: Self-pay | Admitting: Oncology

## 2019-05-21 NOTE — Telephone Encounter (Signed)
Called pt and left voicemail with new appt dates and time. Mailed out a reminder letter and calendar.

## 2019-05-24 DIAGNOSIS — M5136 Other intervertebral disc degeneration, lumbar region: Secondary | ICD-10-CM | POA: Diagnosis not present

## 2019-05-24 DIAGNOSIS — M4716 Other spondylosis with myelopathy, lumbar region: Secondary | ICD-10-CM | POA: Diagnosis not present

## 2019-05-24 DIAGNOSIS — M47816 Spondylosis without myelopathy or radiculopathy, lumbar region: Secondary | ICD-10-CM | POA: Diagnosis not present

## 2019-05-24 DIAGNOSIS — M4316 Spondylolisthesis, lumbar region: Secondary | ICD-10-CM | POA: Diagnosis not present

## 2019-05-24 DIAGNOSIS — M7062 Trochanteric bursitis, left hip: Secondary | ICD-10-CM | POA: Diagnosis not present

## 2019-05-24 DIAGNOSIS — M5416 Radiculopathy, lumbar region: Secondary | ICD-10-CM | POA: Diagnosis not present

## 2019-05-29 ENCOUNTER — Other Ambulatory Visit: Payer: Self-pay | Admitting: Orthopaedic Surgery

## 2019-05-29 DIAGNOSIS — M47816 Spondylosis without myelopathy or radiculopathy, lumbar region: Secondary | ICD-10-CM | POA: Diagnosis not present

## 2019-06-05 DIAGNOSIS — M47816 Spondylosis without myelopathy or radiculopathy, lumbar region: Secondary | ICD-10-CM | POA: Diagnosis not present

## 2019-06-11 ENCOUNTER — Other Ambulatory Visit: Payer: Self-pay

## 2019-06-11 ENCOUNTER — Ambulatory Visit
Admission: RE | Admit: 2019-06-11 | Discharge: 2019-06-11 | Disposition: A | Discharge status: Home / Self Care | Payer: Medicare Other | Source: Ambulatory Visit | Attending: Orthopaedic Surgery | Admitting: Orthopaedic Surgery

## 2019-06-11 DIAGNOSIS — M47816 Spondylosis without myelopathy or radiculopathy, lumbar region: Secondary | ICD-10-CM

## 2019-06-11 DIAGNOSIS — M5126 Other intervertebral disc displacement, lumbar region: Secondary | ICD-10-CM | POA: Diagnosis not present

## 2019-06-17 DIAGNOSIS — M47816 Spondylosis without myelopathy or radiculopathy, lumbar region: Secondary | ICD-10-CM | POA: Diagnosis not present

## 2019-06-19 DIAGNOSIS — E7800 Pure hypercholesterolemia, unspecified: Secondary | ICD-10-CM | POA: Diagnosis not present

## 2019-06-19 DIAGNOSIS — I700 Atherosclerosis of aorta: Secondary | ICD-10-CM | POA: Diagnosis not present

## 2019-06-19 DIAGNOSIS — E1169 Type 2 diabetes mellitus with other specified complication: Secondary | ICD-10-CM | POA: Diagnosis not present

## 2019-06-19 DIAGNOSIS — Z79899 Other long term (current) drug therapy: Secondary | ICD-10-CM | POA: Diagnosis not present

## 2019-06-19 DIAGNOSIS — Z7984 Long term (current) use of oral hypoglycemic drugs: Secondary | ICD-10-CM | POA: Diagnosis not present

## 2019-06-26 DIAGNOSIS — M5416 Radiculopathy, lumbar region: Secondary | ICD-10-CM | POA: Diagnosis not present

## 2019-06-26 DIAGNOSIS — M961 Postlaminectomy syndrome, not elsewhere classified: Secondary | ICD-10-CM | POA: Diagnosis not present

## 2019-06-26 DIAGNOSIS — Z79891 Long term (current) use of opiate analgesic: Secondary | ICD-10-CM | POA: Diagnosis not present

## 2019-06-26 DIAGNOSIS — M5136 Other intervertebral disc degeneration, lumbar region: Secondary | ICD-10-CM | POA: Diagnosis not present

## 2019-07-02 DIAGNOSIS — M19011 Primary osteoarthritis, right shoulder: Secondary | ICD-10-CM | POA: Diagnosis not present

## 2019-07-02 DIAGNOSIS — M25511 Pain in right shoulder: Secondary | ICD-10-CM | POA: Diagnosis not present

## 2019-07-15 DIAGNOSIS — M4316 Spondylolisthesis, lumbar region: Secondary | ICD-10-CM | POA: Diagnosis not present

## 2019-07-15 DIAGNOSIS — M5136 Other intervertebral disc degeneration, lumbar region: Secondary | ICD-10-CM | POA: Diagnosis not present

## 2019-07-15 DIAGNOSIS — M47816 Spondylosis without myelopathy or radiculopathy, lumbar region: Secondary | ICD-10-CM | POA: Diagnosis not present

## 2019-07-23 ENCOUNTER — Inpatient Hospital Stay (HOSPITAL_COMMUNITY)
Admission: EM | Admit: 2019-07-23 | Discharge: 2019-07-25 | DRG: 534 | Disposition: A | Discharge status: Home Health | Payer: Medicare Other | Attending: Family Medicine | Admitting: Family Medicine

## 2019-07-23 ENCOUNTER — Inpatient Hospital Stay (HOSPITAL_COMMUNITY): Payer: Medicare Other

## 2019-07-23 ENCOUNTER — Emergency Department (HOSPITAL_COMMUNITY): Payer: Medicare Other

## 2019-07-23 ENCOUNTER — Other Ambulatory Visit: Payer: Self-pay

## 2019-07-23 ENCOUNTER — Encounter (HOSPITAL_COMMUNITY): Payer: Self-pay | Admitting: Emergency Medicine

## 2019-07-23 DIAGNOSIS — Z886 Allergy status to analgesic agent status: Secondary | ICD-10-CM | POA: Diagnosis not present

## 2019-07-23 DIAGNOSIS — Z96643 Presence of artificial hip joint, bilateral: Secondary | ICD-10-CM | POA: Diagnosis present

## 2019-07-23 DIAGNOSIS — Z6830 Body mass index (BMI) 30.0-30.9, adult: Secondary | ICD-10-CM | POA: Diagnosis not present

## 2019-07-23 DIAGNOSIS — Z20822 Contact with and (suspected) exposure to covid-19: Secondary | ICD-10-CM | POA: Diagnosis present

## 2019-07-23 DIAGNOSIS — G4700 Insomnia, unspecified: Secondary | ICD-10-CM | POA: Diagnosis present

## 2019-07-23 DIAGNOSIS — R262 Difficulty in walking, not elsewhere classified: Secondary | ICD-10-CM | POA: Diagnosis not present

## 2019-07-23 DIAGNOSIS — R296 Repeated falls: Secondary | ICD-10-CM | POA: Diagnosis present

## 2019-07-23 DIAGNOSIS — I959 Hypotension, unspecified: Secondary | ICD-10-CM

## 2019-07-23 DIAGNOSIS — M5001 Cervical disc disorder with myelopathy,  high cervical region: Secondary | ICD-10-CM | POA: Diagnosis not present

## 2019-07-23 DIAGNOSIS — M1990 Unspecified osteoarthritis, unspecified site: Secondary | ICD-10-CM | POA: Diagnosis present

## 2019-07-23 DIAGNOSIS — N400 Enlarged prostate without lower urinary tract symptoms: Secondary | ICD-10-CM | POA: Diagnosis present

## 2019-07-23 DIAGNOSIS — R4182 Altered mental status, unspecified: Secondary | ICD-10-CM | POA: Diagnosis not present

## 2019-07-23 DIAGNOSIS — M5124 Other intervertebral disc displacement, thoracic region: Secondary | ICD-10-CM | POA: Diagnosis not present

## 2019-07-23 DIAGNOSIS — J849 Interstitial pulmonary disease, unspecified: Secondary | ICD-10-CM | POA: Diagnosis present

## 2019-07-23 DIAGNOSIS — R531 Weakness: Secondary | ICD-10-CM | POA: Diagnosis present

## 2019-07-23 DIAGNOSIS — G4733 Obstructive sleep apnea (adult) (pediatric): Secondary | ICD-10-CM | POA: Diagnosis not present

## 2019-07-23 DIAGNOSIS — R260 Ataxic gait: Secondary | ICD-10-CM | POA: Diagnosis present

## 2019-07-23 DIAGNOSIS — I951 Orthostatic hypotension: Secondary | ICD-10-CM | POA: Diagnosis present

## 2019-07-23 DIAGNOSIS — R7989 Other specified abnormal findings of blood chemistry: Secondary | ICD-10-CM | POA: Diagnosis present

## 2019-07-23 DIAGNOSIS — S72411A Displaced unspecified condyle fracture of lower end of right femur, initial encounter for closed fracture: Secondary | ICD-10-CM | POA: Diagnosis present

## 2019-07-23 DIAGNOSIS — E119 Type 2 diabetes mellitus without complications: Secondary | ICD-10-CM

## 2019-07-23 DIAGNOSIS — G9340 Encephalopathy, unspecified: Secondary | ICD-10-CM | POA: Diagnosis present

## 2019-07-23 DIAGNOSIS — S72433A Displaced fracture of medial condyle of unspecified femur, initial encounter for closed fracture: Secondary | ICD-10-CM | POA: Diagnosis present

## 2019-07-23 DIAGNOSIS — Z7984 Long term (current) use of oral hypoglycemic drugs: Secondary | ICD-10-CM

## 2019-07-23 DIAGNOSIS — Z96641 Presence of right artificial hip joint: Secondary | ICD-10-CM | POA: Diagnosis present

## 2019-07-23 DIAGNOSIS — M545 Low back pain: Secondary | ICD-10-CM | POA: Diagnosis not present

## 2019-07-23 DIAGNOSIS — S299XXA Unspecified injury of thorax, initial encounter: Secondary | ICD-10-CM | POA: Diagnosis not present

## 2019-07-23 DIAGNOSIS — Z87891 Personal history of nicotine dependence: Secondary | ICD-10-CM | POA: Diagnosis not present

## 2019-07-23 DIAGNOSIS — R509 Fever, unspecified: Secondary | ICD-10-CM

## 2019-07-23 DIAGNOSIS — S72434A Nondisplaced fracture of medial condyle of right femur, initial encounter for closed fracture: Secondary | ICD-10-CM | POA: Diagnosis not present

## 2019-07-23 DIAGNOSIS — N3000 Acute cystitis without hematuria: Secondary | ICD-10-CM | POA: Diagnosis present

## 2019-07-23 DIAGNOSIS — Z96653 Presence of artificial knee joint, bilateral: Secondary | ICD-10-CM | POA: Diagnosis present

## 2019-07-23 DIAGNOSIS — M25561 Pain in right knee: Secondary | ICD-10-CM | POA: Diagnosis not present

## 2019-07-23 DIAGNOSIS — R2689 Other abnormalities of gait and mobility: Secondary | ICD-10-CM | POA: Diagnosis not present

## 2019-07-23 DIAGNOSIS — E1142 Type 2 diabetes mellitus with diabetic polyneuropathy: Secondary | ICD-10-CM | POA: Diagnosis present

## 2019-07-23 DIAGNOSIS — E669 Obesity, unspecified: Secondary | ICD-10-CM | POA: Diagnosis present

## 2019-07-23 DIAGNOSIS — R609 Edema, unspecified: Secondary | ICD-10-CM | POA: Diagnosis not present

## 2019-07-23 DIAGNOSIS — Z9884 Bariatric surgery status: Secondary | ICD-10-CM

## 2019-07-23 DIAGNOSIS — R0902 Hypoxemia: Secondary | ICD-10-CM | POA: Diagnosis not present

## 2019-07-23 DIAGNOSIS — B9689 Other specified bacterial agents as the cause of diseases classified elsewhere: Secondary | ICD-10-CM | POA: Diagnosis present

## 2019-07-23 DIAGNOSIS — Z79899 Other long term (current) drug therapy: Secondary | ICD-10-CM

## 2019-07-23 DIAGNOSIS — M4802 Spinal stenosis, cervical region: Secondary | ICD-10-CM | POA: Diagnosis not present

## 2019-07-23 DIAGNOSIS — M5020 Other cervical disc displacement, unspecified cervical region: Secondary | ICD-10-CM | POA: Diagnosis present

## 2019-07-23 DIAGNOSIS — Z825 Family history of asthma and other chronic lower respiratory diseases: Secondary | ICD-10-CM

## 2019-07-23 DIAGNOSIS — J329 Chronic sinusitis, unspecified: Secondary | ICD-10-CM | POA: Diagnosis present

## 2019-07-23 DIAGNOSIS — J45909 Unspecified asthma, uncomplicated: Secondary | ICD-10-CM | POA: Diagnosis present

## 2019-07-23 DIAGNOSIS — Z981 Arthrodesis status: Secondary | ICD-10-CM

## 2019-07-23 DIAGNOSIS — W19XXXD Unspecified fall, subsequent encounter: Secondary | ICD-10-CM | POA: Diagnosis not present

## 2019-07-23 DIAGNOSIS — D509 Iron deficiency anemia, unspecified: Secondary | ICD-10-CM | POA: Diagnosis present

## 2019-07-23 DIAGNOSIS — W19XXXA Unspecified fall, initial encounter: Secondary | ICD-10-CM | POA: Diagnosis not present

## 2019-07-23 DIAGNOSIS — R52 Pain, unspecified: Secondary | ICD-10-CM | POA: Diagnosis not present

## 2019-07-23 DIAGNOSIS — Z9989 Dependence on other enabling machines and devices: Secondary | ICD-10-CM | POA: Diagnosis not present

## 2019-07-23 DIAGNOSIS — Z8249 Family history of ischemic heart disease and other diseases of the circulatory system: Secondary | ICD-10-CM

## 2019-07-23 DIAGNOSIS — F418 Other specified anxiety disorders: Secondary | ICD-10-CM | POA: Diagnosis present

## 2019-07-23 DIAGNOSIS — S3992XA Unspecified injury of lower back, initial encounter: Secondary | ICD-10-CM | POA: Diagnosis not present

## 2019-07-23 DIAGNOSIS — Z7982 Long term (current) use of aspirin: Secondary | ICD-10-CM

## 2019-07-23 DIAGNOSIS — S72414A Nondisplaced unspecified condyle fracture of lower end of right femur, initial encounter for closed fracture: Secondary | ICD-10-CM

## 2019-07-23 DIAGNOSIS — M5116 Intervertebral disc disorders with radiculopathy, lumbar region: Secondary | ICD-10-CM | POA: Diagnosis present

## 2019-07-23 DIAGNOSIS — S0990XA Unspecified injury of head, initial encounter: Secondary | ICD-10-CM | POA: Diagnosis not present

## 2019-07-23 DIAGNOSIS — M5030 Other cervical disc degeneration, unspecified cervical region: Secondary | ICD-10-CM | POA: Diagnosis present

## 2019-07-23 DIAGNOSIS — Z818 Family history of other mental and behavioral disorders: Secondary | ICD-10-CM

## 2019-07-23 DIAGNOSIS — Z888 Allergy status to other drugs, medicaments and biological substances status: Secondary | ICD-10-CM

## 2019-07-23 LAB — ETHANOL: Alcohol, Ethyl (B): <10 mg/dL (ref <10)

## 2019-07-23 LAB — RETICULOCYTES
Immature Retic Fract: 8.7 % (ref 2.3–15.9)
RBC.: 3.04 MIL/uL — ABNORMAL LOW (ref 4.22–5.81)
Retic Count, Absolute: 43.5 10*3/uL (ref 19.0–186.0)
Retic Ct Pct: 1.4 % (ref 0.4–3.1)

## 2019-07-23 LAB — I-STAT CHEM 8, ED
BUN: 12 mg/dL (ref 8–23)
Calcium, Ion: 1.17 mmol/L (ref 1.15–1.40)
Chloride: 101 mmol/L (ref 98–111)
Creatinine, Ser: 0.7 mg/dL (ref 0.61–1.24)
Glucose, Bld: 109 mg/dL — ABNORMAL HIGH (ref 70–99)
HCT: 33 % — ABNORMAL LOW (ref 39.0–52.0)
Hemoglobin: 11.2 g/dL — ABNORMAL LOW (ref 13.0–17.0)
Potassium: 4.1 mmol/L (ref 3.5–5.1)
Sodium: 140 mmol/L (ref 135–145)
TCO2: 31 mmol/L (ref 22–32)

## 2019-07-23 LAB — CBC
HCT: 35.4 % — ABNORMAL LOW (ref 39.0–52.0)
Hemoglobin: 11.3 g/dL — ABNORMAL LOW (ref 13.0–17.0)
MCH: 31.8 pg (ref 26.0–34.0)
MCHC: 31.9 g/dL (ref 30.0–36.0)
MCV: 99.7 fL (ref 80.0–100.0)
Platelets: 154 10*3/uL (ref 150–400)
RBC: 3.55 MIL/uL — ABNORMAL LOW (ref 4.22–5.81)
RDW: 13.2 % (ref 11.5–15.5)
WBC: 5.6 10*3/uL (ref 4.0–10.5)
nRBC: 0 % (ref 0.0–0.2)

## 2019-07-23 LAB — DIFFERENTIAL
Abs Immature Granulocytes: 0.02 10*3/uL (ref 0.00–0.07)
Basophils Absolute: 0 10*3/uL (ref 0.0–0.1)
Basophils Relative: 1 %
Eosinophils Absolute: 0.1 10*3/uL (ref 0.0–0.5)
Eosinophils Relative: 2 %
Immature Granulocytes: 0 %
Lymphocytes Relative: 9 %
Lymphs Abs: 0.5 10*3/uL — ABNORMAL LOW (ref 0.7–4.0)
Monocytes Absolute: 0.3 10*3/uL (ref 0.1–1.0)
Monocytes Relative: 6 %
Neutro Abs: 4.6 10*3/uL (ref 1.7–7.7)
Neutrophils Relative %: 82 %

## 2019-07-23 LAB — RAPID URINE DRUG SCREEN, HOSP PERFORMED
Amphetamines: NOT DETECTED
Barbiturates: NOT DETECTED
Benzodiazepines: POSITIVE — AB
Cocaine: NOT DETECTED
Opiates: POSITIVE — AB
Tetrahydrocannabinol: NOT DETECTED

## 2019-07-23 LAB — IRON AND TIBC
Iron: 21 ug/dL — ABNORMAL LOW (ref 45–182)
Saturation Ratios: 7 % — ABNORMAL LOW (ref 17.9–39.5)
TIBC: 294 ug/dL (ref 250–450)
UIBC: 273 ug/dL

## 2019-07-23 LAB — COMPREHENSIVE METABOLIC PANEL
ALT: 44 U/L (ref 0–44)
AST: 35 U/L (ref 15–41)
Albumin: 3.7 g/dL (ref 3.5–5.0)
Alkaline Phosphatase: 52 U/L (ref 38–126)
Anion gap: 12 (ref 5–15)
BUN: 13 mg/dL (ref 8–23)
CO2: 28 mmol/L (ref 22–32)
Calcium: 8.8 mg/dL — ABNORMAL LOW (ref 8.9–10.3)
Chloride: 101 mmol/L (ref 98–111)
Creatinine, Ser: 0.71 mg/dL (ref 0.61–1.24)
GFR calc Af Amer: >60 mL/min (ref >60)
GFR calc non Af Amer: >60 mL/min (ref >60)
Glucose, Bld: 114 mg/dL — ABNORMAL HIGH (ref 70–99)
Potassium: 4.3 mmol/L (ref 3.5–5.1)
Sodium: 141 mmol/L (ref 135–145)
Total Bilirubin: 0.6 mg/dL (ref 0.3–1.2)
Total Protein: 6.5 g/dL (ref 6.5–8.1)

## 2019-07-23 LAB — HEMOGLOBIN A1C
Hgb A1c MFr Bld: 5.9 % — ABNORMAL HIGH (ref 4.8–5.6)
Mean Plasma Glucose: 122.63 mg/dL

## 2019-07-23 LAB — URINALYSIS, ROUTINE W REFLEX MICROSCOPIC
Bilirubin Urine: NEGATIVE
Glucose, UA: NEGATIVE mg/dL
Hgb urine dipstick: NEGATIVE
Ketones, ur: NEGATIVE mg/dL
Nitrite: NEGATIVE
Protein, ur: NEGATIVE mg/dL
Specific Gravity, Urine: 1.012 (ref 1.005–1.030)
pH: 6 (ref 5.0–8.0)

## 2019-07-23 LAB — TSH: TSH: 0.706 u[IU]/mL (ref 0.350–4.500)

## 2019-07-23 LAB — GLUCOSE, CAPILLARY
Glucose-Capillary: 154 mg/dL — ABNORMAL HIGH (ref 70–99)
Glucose-Capillary: 126 mg/dL — ABNORMAL HIGH (ref 70–99)

## 2019-07-23 LAB — LACTIC ACID, PLASMA
Lactic Acid, Venous: 1.8 mmol/L (ref 0.5–1.9)
Lactic Acid, Venous: 1.4 mmol/L (ref 0.5–1.9)

## 2019-07-23 LAB — PROTIME-INR
INR: 1 (ref 0.8–1.2)
Prothrombin Time: 12.9 seconds (ref 11.4–15.2)

## 2019-07-23 LAB — SARS CORONAVIRUS 2 BY RT PCR (HOSPITAL ORDER, PERFORMED IN ~~LOC~~ HOSPITAL LAB): SARS Coronavirus 2: NEGATIVE

## 2019-07-23 LAB — FOLATE: Folate: 29.7 ng/mL (ref >5.9)

## 2019-07-23 LAB — FERRITIN: Ferritin: 101 ng/mL (ref 24–336)

## 2019-07-23 LAB — MRSA PCR SCREENING: MRSA by PCR: NEGATIVE

## 2019-07-23 LAB — APTT: aPTT: 28 seconds (ref 24–36)

## 2019-07-23 LAB — VITAMIN B12: Vitamin B-12: >7500 pg/mL — ABNORMAL HIGH (ref 180–914)

## 2019-07-23 MED ORDER — SODIUM CHLORIDE 0.9% FLUSH
3.0000 mL | Freq: Two times a day (BID) | INTRAVENOUS | Status: DC
  Administered 2019-07-23 – 2019-07-25 (×5): 3 mL via INTRAVENOUS

## 2019-07-23 MED ORDER — CLONAZEPAM 0.5 MG PO TABS
0.5000 mg | ORAL_TABLET | Freq: Every day | ORAL | Status: DC
  Administered 2019-07-23: 0.5 mg via ORAL
  Filled 2019-07-23: qty 1

## 2019-07-23 MED ORDER — DUTASTERIDE 0.5 MG PO CAPS
0.5000 mg | ORAL_CAPSULE | Freq: Every day | ORAL | Status: DC
  Administered 2019-07-24 – 2019-07-25 (×2): 0.5 mg via ORAL
  Filled 2019-07-23 (×4): qty 1

## 2019-07-23 MED ORDER — CHLORHEXIDINE GLUCONATE CLOTH 2 % EX PADS
6.0000 | MEDICATED_PAD | Freq: Every day | CUTANEOUS | Status: DC
  Administered 2019-07-24 – 2019-07-25 (×2): 6 via TOPICAL

## 2019-07-23 MED ORDER — INSULIN ASPART 100 UNIT/ML ~~LOC~~ SOLN: 0.0000 [IU] | Freq: Every day | SUBCUTANEOUS | Status: DC

## 2019-07-23 MED ORDER — INSULIN ASPART 100 UNIT/ML ~~LOC~~ SOLN
0.0000 [IU] | Freq: Three times a day (TID) | SUBCUTANEOUS | Status: DC
  Administered 2019-07-23: 2 [IU] via SUBCUTANEOUS
  Administered 2019-07-24 (×3): 1 [IU] via SUBCUTANEOUS
  Administered 2019-07-25: 2 [IU] via SUBCUTANEOUS
  Administered 2019-07-25: 1 [IU] via SUBCUTANEOUS

## 2019-07-23 MED ORDER — SODIUM CHLORIDE 0.9 % IV SOLN: 250.0000 mL | INTRAVENOUS | Status: DC | PRN

## 2019-07-23 MED ORDER — HYDROCODONE-ACETAMINOPHEN 10-325 MG PO TABS
1.0000 | ORAL_TABLET | Freq: Four times a day (QID) | ORAL | Status: DC | PRN
  Administered 2019-07-23 – 2019-07-25 (×5): 2 via ORAL
  Filled 2019-07-23 (×5): qty 2

## 2019-07-23 MED ORDER — TAMSULOSIN HCL 0.4 MG PO CAPS
0.4000 mg | ORAL_CAPSULE | Freq: Every day | ORAL | Status: DC
  Administered 2019-07-23 – 2019-07-25 (×3): 0.4 mg via ORAL
  Filled 2019-07-23 (×3): qty 1

## 2019-07-23 MED ORDER — FINASTERIDE 5 MG PO TABS
5.0000 mg | ORAL_TABLET | Freq: Every day | ORAL | Status: DC
  Filled 2019-07-23 (×2): qty 1

## 2019-07-23 MED ORDER — ALBUTEROL SULFATE (2.5 MG/3ML) 0.083% IN NEBU: 3.0000 mL | INHALATION_SOLUTION | Freq: Four times a day (QID) | RESPIRATORY_TRACT | Status: DC | PRN

## 2019-07-23 MED ORDER — ACETAMINOPHEN 650 MG RE SUPP: 650.0000 mg | Freq: Four times a day (QID) | RECTAL | Status: DC | PRN

## 2019-07-23 MED ORDER — PRAVASTATIN SODIUM 40 MG PO TABS
40.0000 mg | ORAL_TABLET | Freq: Every day | ORAL | Status: DC
  Administered 2019-07-23 – 2019-07-24 (×2): 40 mg via ORAL
  Filled 2019-07-23 (×2): qty 1

## 2019-07-23 MED ORDER — ACETAMINOPHEN 325 MG PO TABS: 650.0000 mg | ORAL_TABLET | Freq: Four times a day (QID) | ORAL | Status: DC | PRN

## 2019-07-23 MED ORDER — ONDANSETRON HCL 4 MG PO TABS
4.0000 mg | ORAL_TABLET | Freq: Four times a day (QID) | ORAL | Status: DC | PRN
  Administered 2019-07-24: 12:00:00 4 mg via ORAL
  Filled 2019-07-23: qty 1

## 2019-07-23 MED ORDER — ONDANSETRON HCL 4 MG/2ML IJ SOLN: 4.0000 mg | Freq: Four times a day (QID) | INTRAMUSCULAR | Status: DC | PRN

## 2019-07-23 MED ORDER — HYDROCODONE-ACETAMINOPHEN 5-325 MG PO TABS
1.0000 | ORAL_TABLET | Freq: Once | ORAL | Status: AC
  Administered 2019-07-23: 1 via ORAL
  Filled 2019-07-23: qty 1

## 2019-07-23 MED ORDER — IBUPROFEN 800 MG PO TABS
800.0000 mg | ORAL_TABLET | Freq: Once | ORAL | Status: AC
  Administered 2019-07-23: 800 mg via ORAL
  Filled 2019-07-23: qty 1

## 2019-07-23 MED ORDER — ENOXAPARIN SODIUM 40 MG/0.4ML ~~LOC~~ SOLN
40.0000 mg | SUBCUTANEOUS | Status: DC
  Administered 2019-07-23 – 2019-07-24 (×2): 40 mg via SUBCUTANEOUS
  Filled 2019-07-23 (×2): qty 0.4

## 2019-07-23 MED ORDER — MORPHINE SULFATE (PF) 4 MG/ML IV SOLN
4.0000 mg | Freq: Once | INTRAVENOUS | Status: AC
  Administered 2019-07-23: 12:00:00 4 mg via INTRAVENOUS
  Filled 2019-07-23: qty 1

## 2019-07-23 MED ORDER — LORAZEPAM 0.5 MG PO TABS: 0.5000 mg | ORAL_TABLET | Freq: Once | ORAL | Status: DC

## 2019-07-23 MED ORDER — SODIUM CHLORIDE 0.9% FLUSH: 3.0000 mL | INTRAVENOUS | Status: DC | PRN

## 2019-07-23 MED ORDER — ALPRAZOLAM 0.5 MG PO TABS
0.5000 mg | ORAL_TABLET | Freq: Once | ORAL | Status: AC
  Administered 2019-07-23: 0.5 mg via ORAL
  Filled 2019-07-23: qty 1

## 2019-07-23 MED ORDER — SODIUM CHLORIDE 0.9 % IV BOLUS
1000.0000 mL | Freq: Once | INTRAVENOUS | Status: AC
  Administered 2019-07-23: 1000 mL via INTRAVENOUS

## 2019-07-23 MED ORDER — SODIUM CHLORIDE 0.9 % IV BOLUS
500.0000 mL | Freq: Once | INTRAVENOUS | Status: AC
  Administered 2019-07-23: 500 mL via INTRAVENOUS

## 2019-07-23 MED ORDER — FLUTICASONE PROPIONATE 50 MCG/ACT NA SUSP
2.0000 | Freq: Two times a day (BID) | NASAL | Status: DC
  Administered 2019-07-23 – 2019-07-25 (×5): 2 via NASAL
  Filled 2019-07-23 (×2): qty 16

## 2019-07-23 MED ORDER — ASPIRIN EC 81 MG PO TBEC: 81.0000 mg | DELAYED_RELEASE_TABLET | Freq: Every day | ORAL | Status: DC | PRN

## 2019-07-23 MED ORDER — SODIUM CHLORIDE 0.9 % IV SOLN
1.0000 g | INTRAVENOUS | Status: DC
  Administered 2019-07-24 – 2019-07-25 (×2): 1 g via INTRAVENOUS
  Filled 2019-07-23 (×2): qty 10

## 2019-07-23 MED ORDER — METHOCARBAMOL 500 MG PO TABS
1000.0000 mg | ORAL_TABLET | Freq: Every day | ORAL | Status: DC
  Administered 2019-07-23 – 2019-07-24 (×2): 1000 mg via ORAL
  Filled 2019-07-23 (×2): qty 2

## 2019-07-23 MED ORDER — MIDODRINE HCL 5 MG PO TABS
10.0000 mg | ORAL_TABLET | Freq: Three times a day (TID) | ORAL | Status: DC
  Administered 2019-07-23 – 2019-07-25 (×6): 10 mg via ORAL
  Filled 2019-07-23 (×6): qty 2

## 2019-07-23 MED ORDER — GABAPENTIN 300 MG PO CAPS
300.0000 mg | ORAL_CAPSULE | Freq: Every day | ORAL | Status: DC
  Administered 2019-07-23 – 2019-07-24 (×2): 300 mg via ORAL
  Filled 2019-07-23 (×2): qty 1

## 2019-07-23 MED ORDER — BUPROPION HCL ER (XL) 150 MG PO TB24
300.0000 mg | ORAL_TABLET | Freq: Every day | ORAL | Status: DC
  Administered 2019-07-23 – 2019-07-24 (×2): 300 mg via ORAL
  Filled 2019-07-23 (×4): qty 2

## 2019-07-23 MED ORDER — SODIUM CHLORIDE 0.9 % IV SOLN
1.0000 g | Freq: Once | INTRAVENOUS | Status: AC
  Administered 2019-07-23: 08:00:00 1 g via INTRAVENOUS
  Filled 2019-07-23: qty 10

## 2019-07-23 NOTE — ED Provider Notes (Signed)
[facility] EMERGENCY DEPARTMENT Provider Note   CSN: [id number] Arrival date & time: 07/23/19  0601     History Chief Complaint  Patient presents with  . Fall    [name]. is a 74 y.o. male.  HPI     This is a 74 year old male with a history of bilateral hip and knee replacements, lumbar fusions, diabetes who presents following multiple falls.  Patient reports over the last 24 hours he has had 3 falls.  He describes ongoing issues with lumbar pain and radiculopathy.  He states that he has had multiple procedures for the symptoms.  He states he was doing well yesterday when all of a sudden he felt like he was having difficulty "knowing where my legs were in space."  He states that this made him walk unevenly and he fell.  He denies dizziness or syncope.  He states he has had several more episodes causing him to fall.  He describes an ataxic gait and proprioceptive issues.  He states that previously he has had issues with his right knee regarding mobility which sometimes makes it difficult for him to walk.  He has not had any headache.  He does report that he hit his head but he did not lose consciousness.  Currently he is complaining of back pain, right knee pain.  Denies any recent fevers or systemic symptoms.  Past Medical History:  Diagnosis Date  . Arthritis   . Asthma    anxiety, tree/grass pollen  . BPH (benign prostatic hyperplasia)   . Bronchitis    hx of  . Bulge of cervical disc without myelopathy   . Chest pain    a. 06/2015 MV: EF 56%, no ischemia/infarct.  . Complication of anesthesia Q000111Q   no complications but just says he typically needs "more than expected" to anesthetize; needs CPAP (setting 10) in recovery  . Depression    ADD  . Diabetes mellitus    PCP [name], type 2  . Insomnia   . Mitral valve prolapse    a. 06/2015 Echo: EF 65-70%, mild LVH, no rwma, mild MVP of ant leaflet and mild to mod posteriorly directed MR, mildly dil La.  .  Obesity   . PVC's (premature ventricular contractions)   . Recurrent upper respiratory infection (URI)    states Dr Wynelle Link aware- no fever- states is improving  . Sleep apnea    settings 10.6  / followed by [name] study years ago  . Tremor    d/t lithium    Patient Active Problem List   Diagnosis Date Noted  . Cough 02/20/2019  . Absolute anemia 02/04/2019  . Iron deficiency anemia 02/04/2019  . Abnormal findings on diagnostic imaging of lung 11/27/2018  . ILD (interstitial lung disease) ([place]) 11/27/2018  . Mild obesity 08/05/2017  . Type 2 diabetes mellitus treated without insulin ([place]) 08/05/2017  . MVP (mitral valve prolapse) 08/05/2017  . Orthostatic hypotension 01/18/2017  . Upper airway cough syndrome 11/23/2016  . Cough variant asthma 11/23/2016  . 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 Left ventricular hypertrophy 10/03/2015  . Mixed hyperlipidemia 10/03/2015  . PVCs (premature ventricular contractions) 07/13/2015  . Obesity (BMI 30.0-34.9) 07/13/2015  . Chest pain 06/29/2015  . Uncontrolled diabetes mellitus without complication, without long-term current use of insulin 06/29/2015  . Depression 06/29/2015  . BPH (benign prostatic hyperplasia) 06/29/2015  . OSA on CPAP 12/26/2006    Past Surgical History:  Procedure Laterality Date  .  CARDIAC CATHETERIZATION     2008 pre op gastric bypass  . COLONOSCOPY W/ POLYPECTOMY    . 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 JOINT REPLACEMENT     Left, Right Knee, Right hip  . KNEE ARTHROSCOPY     right x 3-4, left x 2  . KNEE ARTHROTOMY  ~1991   right  . LUMBAR FUSION  04/03/2013   [name], L5-S1  . POLYPECTOMY     vocal cords 1992  . 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   Procedure: LEFT TOTAL HIP ARTHROPLASTY ANTERIOR APPROACH;  Surgeon: [name], MD;  Location: [facility];  Service: Orthopedics;  Laterality: Left;  . TOTAL HIP REVISION  04/22/2011   Procedure: TOTAL HIP REVISION;  Surgeon: Gearlean Alf, MD;  Location: [facility];  Service: Orthopedics;  Laterality: Right;       Family History  Problem Relation Age of Onset  . Heart disease Father   . Anxiety disorder Mother   . Asthma Maternal Grandmother     Social History   Tobacco Use  . Smoking status: Former Smoker    Packs/day: 1.50    Years: 20.00    Pack years: 30.00    Types: Cigarettes    Quit date: 04/17/1990    Years since quitting: 29.2  . Smokeless tobacco: Never Used  Substance Use Topics  . Alcohol use: No  . Drug use: No    Home Medications Prior to Admission medications   Medication Sig Start Date End Date Taking? Authorizing Provider  acetaminophen (TYLENOL) 500 MG tablet Take 1,000 mg by mouth daily as needed for headache.    [provider]  albuterol (VENTOLIN HFA) 108 (90 Base) MCG/ACT inhaler Inhale 2 puffs into the lungs every 6 (six) hours as needed for wheezing or shortness of breath. 02/18/19   Tanda Rockers, MD  aspirin EC 81 MG tablet Take 81 mg by mouth daily as needed for mild pain.    [provider]  clonazePAM (KLONOPIN) 0.5 MG tablet Take 0.5 mg by mouth 2 (two) times daily.  05/18/19   [provider]  dutasteride (AVODART) 0.5 MG capsule Take 0.5 mg by mouth daily.    [provider]  fluticasone (FLONASE) 50 MCG/ACT nasal spray Place 2 sprays into the nose 2 (two) times daily. 04/15/14   [provider]  HYDROcodone-acetaminophen (NORCO) 10-325 MG per tablet Take 1-2 tablets by mouth every 6 (six) hours as needed for moderate pain. Patient taking differently: Take 1 tablet by mouth in the morning, at noon, and at bedtime.  04/05/13   Eustace Moore, MD  metFORMIN (GLUCOPHAGE) 500 MG tablet Take 500 mg by mouth 2 (two) times daily with a meal.     [provider]  methocarbamol (ROBAXIN) 500 MG tablet Take 1,000 mg by mouth at bedtime.  01/12/19   [provider]  Multiple Vitamins-Minerals (CENTRUM SILVER ADULT 50+ PO) Take 1  tablet by mouth daily.    [provider]  pioglitazone (ACTOS) 45 MG tablet Take 45 mg by mouth daily.  07/15/14   [provider]  pravastatin (PRAVACHOL) 40 MG tablet Take 40 mg by mouth daily.    [provider]  UNABLE TO FIND Med Name: CPAP 10 cm    [provider]    Allergies    Breo ellipta [fluticasone furoate-vilanterol], Demerol [meperidine], Lamotrigine, and Nsaids  Review of Systems   Review of Systems  Respiratory:  Negative for shortness of breath.   Cardiovascular: Negative for chest pain.  Musculoskeletal:       Right knee pain, back pain  Neurological: Positive for weakness. Negative for numbness.       Gait disturbance  All other systems reviewed and are negative.   Physical Exam Updated Vital Signs BP (!) 128/58   Pulse 66   Temp 100.3 F (37.9 C) (Oral)   Resp 18   Ht 1.88 m (6\' 2" )   Wt 93.9 kg   SpO2 97%   BMI 26.58 kg/m   Physical Exam Vitals and nursing note reviewed.  Constitutional:      Appearance: He is well-developed.  HENT:     Head: Normocephalic and atraumatic.     Nose: Nose normal.     Mouth/Throat:     Mouth: Mucous membranes are moist.  Eyes:     Pupils: Pupils are equal, round, and reactive to light.  Neck:     Comments: No midline C-spine tenderness palpation, step-off, deformity Cardiovascular:     Rate and Rhythm: Normal rate and regular rhythm.     Heart sounds: Normal heart sounds. No murmur.  Pulmonary:     Effort: Pulmonary effort is normal. No respiratory distress.     Breath sounds: Normal breath sounds. No wheezing.  Abdominal:     General: Bowel sounds are normal.     Palpations: Abdomen is soft.     Tenderness: There is no abdominal tenderness. There is no rebound.  Musculoskeletal:     Cervical back: Neck supple.     Comments: Scarring bilateral knees, limited flexion and extension of the right knee secondary to pain, tenderness palpation of the joint line, no obvious  deformities, normal range of motion of bilateral hips  Lymphadenopathy:     Cervical: No cervical adenopathy.  Skin:    General: Skin is warm and dry.  Neurological:     Mental Status: He is alert and oriented to person, place, and time.     Comments: Fluent speech, cranial nerves II through XII intact, no dysmetria to finger-nose-finger, unable to do heel-to-shin secondary to knee mobility, 5 out of 5 strength in all 4 extremities  Psychiatric:        Mood and Affect: Mood normal.     ED Results / Procedures / Treatments   Labs (all labs ordered are listed, but only abnormal results are displayed) Labs Reviewed  ETHANOL  PROTIME-INR  APTT  CBC  DIFFERENTIAL  COMPREHENSIVE METABOLIC PANEL  RAPID URINE DRUG SCREEN, HOSP PERFORMED  URINALYSIS, ROUTINE W REFLEX MICROSCOPIC  I-STAT CHEM 8, ED    EKG None  Radiology No results found.  Procedures Procedures (including critical care time)  Medications Ordered in ED Medications  HYDROcodone-acetaminophen (NORCO/VICODIN) 5-325 MG per tablet 1 tablet (has no administration in time range)    ED Course  I have reviewed the triage vital signs and the nursing notes.  Pertinent labs & imaging results that were available during my care of the patient were reviewed by me and considered in my medical decision making (see chart for details).    MDM Rules/Calculators/A&P                       Patient reports several falls in the last 24 hours.  History of multiple orthopedic surgeries and procedures for radicular symptoms and bilateral hip and knee replacements.  He is overall nontoxic.  Vital signs notable for temperature of 100.3.  He denies any infectious symptoms.  It is unclear exactly the etiology of his falls but he describes proprioceptive issues and possible ataxia.  I did not get him up and ambulate him for fear of falling.  Feel that he needs a stroke work-up and likely MRI.  He also needs CT head to rule out acute  traumatic injury.  Will obtain x-rays of everything that hurts.  Regarding his temperature of 100.3.  He is not septic appearing.  Will obtain chest x-ray and urinalysis to evaluate for infection.  Patient will be signed out to oncoming provider.  Final Clinical Impression(s) / ED Diagnoses Final diagnoses:  None    Rx / DC Orders ED Discharge Orders    None       Terrance Usery, Barbette Hair, MD 07/23/19 0630

## 2019-07-23 NOTE — ED Notes (Signed)
Pt having chills, urine is hazy with sediment and foul smelling.  Dr Gilford Raid informed.

## 2019-07-23 NOTE — ED Notes (Signed)
Pt received Rocephin prior to blood cultures.

## 2019-07-23 NOTE — ED Notes (Signed)
Attempted to call report, nurse in another pt's room and will call back for report

## 2019-07-23 NOTE — ED Provider Notes (Signed)
Pt signed out by Dr. Dina Rich pending Xrays and labs.  Pt's main complaint is his right knee.  He has a questionable nondisplaced medial femoral condyle fx.  He was placed in a knee immobilizer.  Pt has a UTI and was given rocephin.  Urine sent for cx.  Fever has worsened, so he was given additional ibuprofen.  Pt was incontinent of urine several times while here.  He is unable to ambulate.  MRI brain attempted due to ambulatory dysfunction.  However, there was too much motion artifact.  Pt d/w Dr. Manuella Ghazi for admission.   Isla Pence, MD 07/23/19 1141

## 2019-07-23 NOTE — H&P (Addendum)
History and Physical    [name]. ZE:[id number] DOB: [birth year] DOA: 07/23/2019  PCP: Lajean Manes, MD   Patient coming from: Home  Chief Complaint: Confusion and recurrent falls with LE weakness  HPI: [name]. is a 74 y.o. male with medical history significant for anxiety/depression, BPH, type 2 diabetes, and lumbar fusion with multiple joint replacements, who presented to the ED with over 3 falls in the last 24 hours.  He states that he began developing some intermittent lower extremity weakness approximately 2 weeks ago when he was walking through a hardware store and his legs suddenly froze up.  He is noted significant trouble with stiffness of his lower extremities as well as weakness and has had some urinary incontinence as well.  His wife at the bedside states that he is also been quite confused and has been having issues with his memory in the last 1 week.  He denies any headaches, dizziness, syncopal episodes, fevers, or chills.  He has had quite an ataxic gait and has bumped his knee at home which has caused significant pain in his knee.  He states that his back pain has worsened as well.   ED Course: Vital signs are stable, however patient has had a temperature of 103.2.  Blood cultures have been collected and he has been started on Rocephin with suspicion of UTI in the setting of urinary incontinence and some leukocytes noted in his urine.  His brain MRI initially had shown too much motion artifact and head CT does not demonstrate any acute findings.  Other imaging demonstrates right knee nondisplaced distal femoral medial condyle fracture with effusion for which she has had an immobilizer placed.  His Covid testing is pending.  Review of Systems: All others reviewed and otherwise negative except as noted above.  Past Medical History:  Diagnosis Date  . Arthritis   . Asthma    anxiety, tree/grass pollen  . BPH (benign prostatic hyperplasia)   . Bronchitis    hx of  . Bulge of cervical disc without myelopathy   . Chest pain    a. 06/2015 MV: EF 56%, no ischemia/infarct.  . Complication of anesthesia [id number]   no complications but just says he typically needs "more than expected" to anesthetize; needs CPAP (setting 10) in recovery  . Depression    ADD  . Diabetes mellitus    PCP [name], type 2  . Insomnia   . Mitral valve prolapse    a. 06/2015 Echo: EF 65-70%, mild LVH, no rwma, mild MVP of ant leaflet and mild to mod posteriorly directed MR, mildly dil La.  . Obesity   . PVC's (premature ventricular contractions)   . Recurrent upper respiratory infection (URI)    states Dr Wynelle Link aware- no fever- states is improving  . Sleep apnea    settings 10.6  / followed by [name] study years ago  . Tremor    d/t lithium    Past Surgical History:  Procedure Laterality Date  . CARDIAC CATHETERIZATION     2008 pre op gastric bypass  . COLONOSCOPY W/ POLYPECTOMY    . 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 JOINT REPLACEMENT     Left, Right Knee, Right hip  . KNEE ARTHROSCOPY     right x 3-4, left x 2  . KNEE ARTHROTOMY  ~1991   right  . LUMBAR FUSION  04/03/2013   [name], L5-S1  . POLYPECTOMY  vocal cords 1992  . 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   Procedure: LEFT TOTAL HIP ARTHROPLASTY ANTERIOR APPROACH;  Surgeon: [name], MD;  Location: [facility];  Service: Orthopedics;  Laterality: Left;  . TOTAL HIP REVISION  04/22/2011   Procedure: TOTAL HIP REVISION;  Surgeon: Gearlean Alf, MD;  Location: [facility];  Service: Orthopedics;  Laterality: Right;     reports that he quit smoking about 29 years ago. His smoking use included cigarettes. He has a 30.00 pack-year smoking history. He has never used smokeless tobacco. He reports that he does not drink alcohol or use drugs.  Allergies  Allergen Reactions  . Breo Ellipta [Fluticasone Furoate-Vilanterol] Other (See Comments)    Thrush  . Demerol [Meperidine] Itching  .  Lamotrigine Hives and Itching  . Nsaids Nausea Only    Gastric bypass    Family History  Problem Relation Age of Onset  . Heart disease Father   . Anxiety disorder Mother   . Asthma Maternal Grandmother     Prior to Admission medications   Medication Sig Start Date End Date Taking? Authorizing Provider  acetaminophen (TYLENOL) 500 MG tablet Take 1,000 mg by mouth daily as needed for headache.   Yes [provider]  albuterol (VENTOLIN HFA) 108 (90 Base) MCG/ACT inhaler Inhale 2 puffs into the lungs every 6 (six) hours as needed for wheezing or shortness of breath. 02/18/19  Yes Tanda Rockers, MD  aspirin EC 81 MG tablet Take 81 mg by mouth daily as needed for mild pain.   Yes [provider]  buPROPion (WELLBUTRIN XL) 150 MG 24 hr tablet Take 2 tablets by mouth daily. Pt started on 1 tab po on 07/16/19 now should be starting 2 tablets by mouth in the morning. 07/16/19  Yes [provider]  clonazePAM (KLONOPIN) 0.5 MG tablet Take 0.5 mg by mouth daily.  05/18/19  Yes [provider]  dutasteride (AVODART) 0.5 MG capsule Take 0.5 mg by mouth daily.   Yes [provider]  finasteride (PROSCAR) 5 MG tablet Take 1 tablet by mouth daily.   Yes [provider]  fluticasone (FLONASE) 50 MCG/ACT nasal spray Place 2 sprays into the nose 2 (two) times daily. 04/15/14  Yes [provider]  gabapentin (NEURONTIN) 300 MG capsule Take 1 capsule by mouth at bedtime. Pt takes 1 capsule in morning and 1 capsule by mouth at bedtime. 07/11/19  Yes [provider]  HYDROcodone-acetaminophen (NORCO) 10-325 MG per tablet Take 1-2 tablets by mouth every 6 (six) hours as needed for moderate pain. Patient taking differently: Take 1 tablet by mouth in the morning, at noon, and at bedtime.  04/05/13  Yes Eustace Moore, MD  metFORMIN (GLUCOPHAGE) 500 MG tablet Take 500 mg by mouth 2 (two) times daily with a meal.    Yes [provider]    methocarbamol (ROBAXIN) 500 MG tablet Take 1,000 mg by mouth at bedtime.  01/12/19  Yes [provider]  Multiple Vitamins-Minerals (CENTRUM SILVER ADULT 50+ PO) Take 1 tablet by mouth daily.   Yes [provider]  pioglitazone (ACTOS) 45 MG tablet Take 45 mg by mouth daily.  07/15/14  Yes [provider]  pravastatin (PRAVACHOL) 40 MG tablet Take 40 mg by mouth daily.   Yes [provider]  tamsulosin (FLOMAX) 0.4 MG CAPS capsule Take 0.4 mg by mouth daily. 06/30/19  Yes [provider]  UNABLE TO FIND Med Name: CPAP 10 cm  Yes [provider]    Physical Exam: Vitals:   07/23/19 0929 07/23/19 0930 07/23/19 1000 07/23/19 1253  BP:  136/81 127/66 (!) 72/55  Pulse:  (!) 106 86 96  Resp:  (!) 26 10 12   Temp: (!) 103.2 F (39.6 C)   98 F (36.7 C)  TempSrc: Oral   Oral  SpO2:  (!) 86% 90% 93%  Weight:      Height:        Constitutional: NAD, calm, comfortable Vitals:   07/23/19 0929 07/23/19 0930 07/23/19 1000 07/23/19 1253  BP:  136/81 127/66 (!) 72/55  Pulse:  (!) 106 86 96  Resp:  (!) 26 10 12   Temp: (!) 103.2 F (39.6 C)   98 F (36.7 C)  TempSrc: Oral   Oral  SpO2:  (!) 86% 90% 93%  Weight:      Height:       Eyes: lids and conjunctivae normal ENMT: Mucous membranes are moist.  Neck: normal, supple Respiratory: clear to auscultation bilaterally. Normal respiratory effort. No accessory muscle use.  Currently on room air Cardiovascular: Regular rate and rhythm, no murmurs. No extremity edema. Abdomen: no tenderness, no distention. Bowel sounds positive.  Musculoskeletal: Right knee immobilizer present Skin: no rashes, lesions, ulcers.  Psychiatric: Normal judgment and insight. Alert and oriented x 3. Normal mood.   Labs on Admission: I have personally reviewed following labs and imaging studies  CBC: Recent Labs  Lab 07/23/19 0629 07/23/19 0635  WBC 5.6  --   NEUTROABS 4.6  --   HGB 11.3* 11.2*  HCT 35.4*  33.0*  MCV 99.7  --   PLT 154  --    Basic Metabolic Panel: Recent Labs  Lab 07/23/19 0629 07/23/19 0635  NA 141 140  K 4.3 4.1  CL 101 101  CO2 28  --   GLUCOSE 114* 109*  BUN 13 12  CREATININE 0.71 0.70  CALCIUM 8.8*  --    GFR: Estimated Creatinine Clearance: 97 mL/min (by C-G formula based on SCr of 0.7 mg/dL). Liver Function Tests: Recent Labs  Lab 07/23/19 0629  AST 35  ALT 44  ALKPHOS 52  BILITOT 0.6  PROT 6.5  ALBUMIN 3.7   No results for input(s): LIPASE, AMYLASE in the last 168 hours. No results for input(s): AMMONIA in the last 168 hours. Coagulation Profile: Recent Labs  Lab 07/23/19 0629  INR 1.0   Cardiac Enzymes: No results for input(s): CKTOTAL, CKMB, CKMBINDEX, TROPONINI in the last 168 hours. BNP (last 3 results) No results for input(s): PROBNP in the last 8760 hours. HbA1C: No results for input(s): HGBA1C in the last 72 hours. CBG: No results for input(s): GLUCAP in the last 168 hours. Lipid Profile: No results for input(s): CHOL, HDL, LDLCALC, TRIG, CHOLHDL, LDLDIRECT in the last 72 hours. Thyroid Function Tests: No results for input(s): TSH, T4TOTAL, FREET4, T3FREE, THYROIDAB in the last 72 hours. Anemia Panel: No results for input(s): VITAMINB12, FOLATE, FERRITIN, TIBC, IRON, RETICCTPCT in the last 72 hours. Urine analysis:    Component Value Date/Time   COLORURINE YELLOW 07/23/2019 0623   APPEARANCEUR HAZY (A) 07/23/2019 0623   LABSPEC 1.012 07/23/2019 0623   PHURINE 6.0 07/23/2019 0623   GLUCOSEU NEGATIVE 07/23/2019 0623   HGBUR NEGATIVE 07/23/2019 0623   BILIRUBINUR NEGATIVE 07/23/2019 0623   KETONESUR NEGATIVE 07/23/2019 0623   PROTEINUR NEGATIVE 07/23/2019 0623   UROBILINOGEN 0.2 10/14/2014 0944   NITRITE NEGATIVE 07/23/2019 0623   LEUKOCYTESUR MODERATE (A) 07/23/2019  Sisters on Admission: DG Chest 1 View  Result Date: 07/23/2019 CLINICAL DATA:  Weakness, lower back pain, fell 3 times yesterday,  diabetes mellitus, asthma, former smoker EXAM: CHEST  1 VIEW COMPARISON:  08/22/2018 FINDINGS: Upper normal heart size with slight vascular congestion. Mediastinal contours normal. Atherosclerotic calcification aorta. Chronic interstitial changes at the mid to lower lungs greatest at LEFT base. No acute infiltrate, pleural effusion or pneumothorax. Bones demineralized. IMPRESSION: Chronic interstitial changes. No acute abnormalities. Electronically Signed   By: Lavonia Dana M.D.   On: 07/23/2019 08:36   DG Lumbar Spine Complete  Result Date: 07/23/2019 CLINICAL DATA:  Weakness, lower back pain, fell 3 times yesterday, history BILATERAL hip replacements, lumbar fusion, diabetes mellitus, smoker EXAM: LUMBAR SPINE - COMPLETE 4+ VIEW COMPARISON:  01/27/2014 FINDINGS: Osseous demineralization. Five lumbar vertebra. Disc space narrowing and facet degenerative changes L4-L5. Grade 1 anterolisthesis L4-L5 new since prior exam. Chronic anterolisthesis L5-S1 post posterior lumbar fusion. Vertebral body heights maintained. No fracture, bone destruction or definite spondylolysis. SI joints preserved. BILATERAL hip prostheses. Increased stool in colon in pelvis. IMPRESSION: Prior L5-S1 fusion. Increased disc space narrowing and facet degenerative changes L4-L5 with new grade 1 anterolisthesis. Electronically Signed   By: Lavonia Dana M.D.   On: 07/23/2019 08:32   CT HEAD WO CONTRAST  Result Date: 07/23/2019 CLINICAL DATA:  Fall. EXAM: CT HEAD WITHOUT CONTRAST TECHNIQUE: Contiguous axial images were obtained from the base of the skull through the vertex without intravenous contrast. COMPARISON:  CT head dated March 04, 2013. FINDINGS: Brain: No evidence of acute infarction, hemorrhage, hydrocephalus, extra-axial collection or mass lesion/mass effect. Progressive mild generalized cerebral atrophy. Vascular: No hyperdense vessel or unexpected calcification. Skull: Normal. Negative for fracture or focal lesion.  Sinuses/Orbits: Chronic partial opacification of the left frontal sinus and bilateral ethmoid air cells. Remaining paranasal sinuses and mastoid air cells are clear. The orbits are unremarkable. Other: None. IMPRESSION: 1. No acute intracranial abnormality. Progressive mild generalized cerebral atrophy. 2. Chronic sinusitis. Electronically Signed   By: Titus Dubin M.D.   On: 07/23/2019 08:07   MR BRAIN WO CONTRAST  Result Date: 07/23/2019 CLINICAL DATA:  Altered mental status EXAM: MRI HEAD WITHOUT CONTRAST TECHNIQUE: Multiplanar, multiecho pulse sequences of the brain and surrounding structures were obtained without intravenous contrast. COMPARISON:  None. FINDINGS: Sagittal T1, axial DWI, and coronal DWI sequences were attempted and are nondiagnostic due to motion artifact. Patient could not tolerate remainder of study. IMPRESSION: Nondiagnostic study. Electronically Signed   By: Macy Mis M.D.   On: 07/23/2019 11:03   DG Knee Complete 4 Views Right  Result Date: 07/23/2019 CLINICAL DATA:  RIGHT knee pain, fell 3 times yesterday EXAM: RIGHT KNEE - COMPLETE 4+ VIEW COMPARISON:  None FINDINGS: Osseous demineralization. Components of a RIGHT knee prosthesis are identified. Joint effusion present with a few small calcifications anteriorly at the suprapatellar region, some of which are extra-articular, additional of which may represent intra-articular loose bodies. Question nondisplaced fracture of the superior margin of the medial femoral condyle. No periprosthetic lucency. IMPRESSION: Osseous demineralization with RIGHT knee prosthesis. RIGHT knee joint effusion with question of a nondisplaced fracture of the medial femoral condyle. Electronically Signed   By: Lavonia Dana M.D.   On: 07/23/2019 08:35    EKG: Independently reviewed.  Sinus rhythm 64 bpm with RBBB.  Assessment/Plan Active Problems:   Falls    Acute encephalopathy with lower extremity weakness and falls -Concern for possible  normal pressure  hydrocephalus in setting of urinary incontinence, confusion, and gait abnormalities -Repeat brain MRI with contrast -Concern for cervical myelopathy as well, unfortunately cervical MRI could not be performed given patient's body habitus -He may also have parkinsonism and/or dementia -Initial MRI was quite motion degraded and CVA cannot be ruled out -Fall precautions and PT evaluation -Neurology consultation for further evaluation appreciated  Fever with possible UTI -Blood and urine cultures pending -Maintain on Rocephin for now -Monitor fevers  Right femoral condyle fracture nondisplaced secondary to fall -With effusion noted -Currently in immobilizer with no need for inpatient orthopedic management, will need to follow-up outpatient  Normocytic anemia -Obtain anemia panel -No overt bleeding, repeat CBC  Type 2 diabetes-stable -Hold home Metformin and maintain on SSI -Carb modified diet  Diabetic peripheral neuropathy -Continue home gabapentin  Anxiety/depression -Continue Klonopin and bupropion  BPH -Continue Flomax,Avodart, and finasteride  Multiple prior orthopedic procedures -Prior bilateral knee replacements -L5-S1 lumbar fusion -Prior hip replacement   DVT prophylaxis: Lovenox Code Status: Full Family Communication: Wife at bedside Disposition Plan:Admit for Neurology evaluation of ataxia and likely placement Consults called:Neurology Admission status: Inpatient, MedSurg Status is: Inpatient  Remains inpatient appropriate because:Altered mental status and Unsafe d/c plan   Dispo: The patient is from: Home              Anticipated d/c is to: SNF              Anticipated d/c date is: 2 days              Patient currently is not medically stable to d/c.  He needs further neurology evaluation and likely placement with ongoing falls at home.  [name] D Manuella Ghazi DO [facility]  If 7PM-7AM, please contact  night-coverage [email or web address]  07/23/2019, 1:08 PM

## 2019-07-23 NOTE — ED Notes (Signed)
Wife changed mind and says she wants pt to have Motrin, concerned because pt had gastric by-pass.

## 2019-07-23 NOTE — Progress Notes (Signed)
[facility] night shift.  The staff informed me that the patient's most recent blood pressure measurement was low at 74/49 mmHg.  The patient also told the staff that he has been taking midodrine 3 times a day.  He did not specified if he was 5 or 10 mg midodrine tablets.  I have started midodrine 10 mg p.o. 3 times daily and I added to his medication profile.  Tennis Must, MD

## 2019-07-23 NOTE — ED Triage Notes (Signed)
Pt here after having 3 falls tonight at home due to weakness. Pt states that he hit his head several times. C/o pain to lower back, right knee, and both thighs are numb.

## 2019-07-23 NOTE — ED Notes (Signed)
Wife refused Motrin, MD aware.

## 2019-07-23 NOTE — Consult Note (Addendum)
[place] A. Merlene Laughter, MD     [email or web address]          [name]. is an 74 y.o. male.   ASSESSMENT/PLAN: 1.  Acute gait impairment of unclear etiology: The patient does not have signs of neuropathy and therefore Guillain-Barr syndrome is unlikely.  He does have brisk reflexes and does have a history of cervical disc disease raising the possibility of cervical myelopathy.  Given his fever, myopathy of the thoracic region from infection is also concerning.  Patient does not have parkinsonian features and the history and imaging does not support diagnosis of normal pressure hydrocephalus.  The acuteness of the presentation also suggest an ischemic stroke not seen on CT.  Medication effect may be a significant concern as the patient was recently started on Wellbutrin which is known to cause neurological problems especially when taken in combination with other psychotropic medications.  The patient appears to have had fracture of the right knee but it is unlikely that this alone is the cause of the patient's etiology.  The patient does appear to have acute cystitis which I think is contributed to his balance on the not the sole etiology.  Cervical spine MRI along with MRI of the thoracic spine will be obtained.  We should hold the Wellbutrin for now.  Additional labs will also be done.  Physical and occupational therapies are recommended.  We should also check the patient for orthostatic hypotension.    This is a 74 year old right-handed white male who presents with the acute onset of gait impairment.  The patient tells me that he simply could not move his legs.  His gait essentially froze and he was not able to move.  He knew what he wanted to do but his legs just would not cooperate.  This happened about 3 days ago and seem to fluctuate over the last 3 days.  He has tended to lean towards the left at times but sometimes towards the right.  He has had a couple of falls.   The patient does not report having clear focal weakness or numbness.  He does not report diplopia, dysarthria or dysphagia.  He reports having a history of low back problems in the past and has had surgery there.  He also is supposed to have right shoulder surgery.  He denies any neck pain.  The patient does not report having bladder or bowel incontinence.  The review of systems is otherwise negative.    GENERAL: This is a talkative pleasant male who is in no acute distress although he appears in some discomfort.  HEENT: The neck is supple no trauma appreciated.  Appears to be somewhat hearing impaired.  ABDOMEN: soft  EXTREMITIES: No edema; the right knee is in the brace and the moderately painful  BACK: Normal  SKIN: Normal by inspection.    MENTAL STATUS: Alert and oriented. Speech, language and cognition are generally intact. Judgment and insight normal.   CRANIAL NERVES: Pupils are equal, round and reactive to light and accomodation; extra ocular movements are full, there is no significant nystagmus; visual fields are full; upper and lower facial muscles are normal in strength and symmetric, there is no flattening of the nasolabial folds; tongue is midline; uvula is midline; shoulder elevation is normal.  MOTOR: Upper extremity shows normal tone, bulk and strength.  Right lower extremity proximally and distally graded as 4+/5 with normal bulk and tone.  The left lower extremity shows normal tone, bulk and  strength.  COORDINATION: Left finger to nose is normal, right finger to nose is normal, No rest tremor; no intention tremor; no postural tremor; no bradykinesia.  REFLEXES: Deep tendon reflexes are symmetrical and normal in the upper extremities but brisk in the legs with cross adductors noted. Plantar reflexes are extensor on the right and flexor.   SENSATION: Normal to light touch, temperature, and pain.        Blood pressure (!) 77/60, pulse 83, temperature 98 F (36.7 C),  temperature source Oral, resp. rate 12, height 6\' 2"  (1.88 m), weight 103.1 kg, SpO2 99 %.  Past Medical History:  Diagnosis Date  . Arthritis   . Asthma    anxiety, tree/grass pollen  . BPH (benign prostatic hyperplasia)   . Bronchitis    hx of  . Bulge of cervical disc without myelopathy   . Chest pain    a. 06/2015 MV: EF 56%, no ischemia/infarct.  . Complication of anesthesia [id number]   no complications but just says he typically needs "more than expected" to anesthetize; needs CPAP (setting 10) in recovery  . Depression    ADD  . Diabetes mellitus    PCP [name], type 2  . Insomnia   . Mitral valve prolapse    a. 06/2015 Echo: EF 65-70%, mild LVH, no rwma, mild MVP of ant leaflet and mild to mod posteriorly directed MR, mildly dil La.  . Obesity   . PVC's (premature ventricular contractions)   . Recurrent upper respiratory infection (URI)    states Dr Wynelle Link aware- no fever- states is improving  . Sleep apnea    settings 10.6  / followed by [name] study years ago  . Tremor    d/t lithium    Past Surgical History:  Procedure Laterality Date  . CARDIAC CATHETERIZATION     2008 pre op gastric bypass  . COLONOSCOPY W/ POLYPECTOMY    . 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 JOINT REPLACEMENT     Left, Right Knee, Right hip  . KNEE ARTHROSCOPY     right x 3-4, left x 2  . KNEE ARTHROTOMY  ~1991   right  . LUMBAR FUSION  04/03/2013   [name], L5-S1  . POLYPECTOMY     vocal cords 1992  . 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   Procedure: LEFT TOTAL HIP ARTHROPLASTY ANTERIOR APPROACH;  Surgeon: [name], MD;  Location: [facility];  Service: Orthopedics;  Laterality: Left;  . TOTAL HIP REVISION  04/22/2011   Procedure: TOTAL HIP REVISION;  Surgeon: Gearlean Alf, MD;  Location: [facility];  Service: Orthopedics;  Laterality: Right;    Family History  Problem Relation Age of Onset  . Heart disease Father   . Anxiety disorder Mother   . Asthma Maternal Grandmother       Social History:  reports that he quit smoking about 29 years ago. His smoking use included cigarettes. He has a 30.00 pack-year smoking history. He has never used smokeless tobacco. He reports that he does not drink alcohol or use drugs.  Allergies:  Allergies  Allergen Reactions  . Breo Ellipta [Fluticasone Furoate-Vilanterol] Other (See Comments)    Thrush  . Demerol [Meperidine] Itching  . Lamotrigine Hives and Itching  . Nsaids Nausea Only    Gastric bypass    Medications: Prior to Admission medications   Medication Sig Start Date End Date Taking? Authorizing Provider  acetaminophen (TYLENOL) 500 MG tablet Take 1,000  mg by mouth daily as needed for headache.   Yes [provider]  albuterol (VENTOLIN HFA) 108 (90 Base) MCG/ACT inhaler Inhale 2 puffs into the lungs every 6 (six) hours as needed for wheezing or shortness of breath. 02/18/19  Yes Tanda Rockers, MD  aspirin EC 81 MG tablet Take 81 mg by mouth daily as needed for mild pain.   Yes [provider]  buPROPion (WELLBUTRIN XL) 150 MG 24 hr tablet Take 2 tablets by mouth daily. Pt started on 1 tab po on 07/16/19 now should be starting 2 tablets by mouth in the morning. 07/16/19  Yes [provider]  clonazePAM (KLONOPIN) 0.5 MG tablet Take 0.5 mg by mouth daily.  05/18/19  Yes [provider]  dutasteride (AVODART) 0.5 MG capsule Take 0.5 mg by mouth daily.   Yes [provider]  fluticasone (FLONASE) 50 MCG/ACT nasal spray Place 2 sprays into the nose 2 (two) times daily. 04/15/14  Yes [provider]  gabapentin (NEURONTIN) 300 MG capsule Take 1 capsule by mouth at bedtime. Pt takes 1 capsule in morning and 1 capsule by mouth at bedtime. 07/11/19  Yes [provider]  HYDROcodone-acetaminophen (NORCO) 10-325 MG per tablet Take 1-2 tablets by mouth every 6 (six) hours as needed for moderate pain. Patient taking differently: Take 1 tablet by mouth in the morning, at noon,  and at bedtime.  04/05/13  Yes Eustace Moore, MD  metFORMIN (GLUCOPHAGE) 500 MG tablet Take 500 mg by mouth 2 (two) times daily with a meal.    Yes [provider]  methocarbamol (ROBAXIN) 500 MG tablet Take 1,000 mg by mouth at bedtime.  01/12/19  Yes [provider]  Multiple Vitamins-Minerals (CENTRUM SILVER ADULT 50+ PO) Take 1 tablet by mouth daily.   Yes [provider]  pioglitazone (ACTOS) 45 MG tablet Take 45 mg by mouth daily.  07/15/14  Yes [provider]  pravastatin (PRAVACHOL) 40 MG tablet Take 40 mg by mouth daily.   Yes [provider]  tamsulosin (FLOMAX) 0.4 MG CAPS capsule Take 0.4 mg by mouth daily. 06/30/19  Yes [provider]  UNABLE TO FIND Med Name: CPAP 10 cm   Yes [provider]  finasteride (PROSCAR) 5 MG tablet Take 1 tablet by mouth daily.    [provider]    Scheduled Meds: . buPROPion  300 mg Oral Daily  . clonazePAM  0.5 mg Oral Daily  . dutasteride  0.5 mg Oral Daily  . enoxaparin (LOVENOX) injection  40 mg Subcutaneous Q24H  . fluticasone  2 spray Each Nare BID  . gabapentin  300 mg Oral QHS  . insulin aspart  0-5 Units Subcutaneous QHS  . insulin aspart  0-9 Units Subcutaneous TID WC  . methocarbamol  1,000 mg Oral QHS  . pravastatin  40 mg Oral QHS  . sodium chloride flush  3 mL Intravenous Q12H  . tamsulosin  0.4 mg Oral Daily   Continuous Infusions: . sodium chloride    . [START ON 07/24/2019] cefTRIAXone (ROCEPHIN)  IV    . sodium chloride     PRN Meds:.sodium chloride, acetaminophen **OR** acetaminophen, albuterol, aspirin EC, HYDROcodone-acetaminophen, ondansetron **OR** ondansetron (ZOFRAN) IV, sodium chloride flush     Results for orders placed or performed during the hospital encounter of 07/23/19 (from the past 48 hour(s))  Urine rapid drug screen (hosp performed)     Status: Abnormal   Collection Time: 07/23/19  6:23 AM  Result Value Ref Range   Opiates  POSITIVE (A) NONE DETECTED   Cocaine NONE DETECTED NONE DETECTED   Benzodiazepines POSITIVE (A) NONE DETECTED   Amphetamines NONE DETECTED NONE DETECTED   Tetrahydrocannabinol NONE DETECTED NONE DETECTED   Barbiturates NONE DETECTED NONE DETECTED    Comment: (NOTE) DRUG SCREEN FOR MEDICAL PURPOSES ONLY.  IF CONFIRMATION IS NEEDED FOR ANY PURPOSE, NOTIFY LAB WITHIN 5 DAYS. LOWEST DETECTABLE LIMITS FOR URINE DRUG SCREEN Drug Class                     Cutoff (ng/mL) Amphetamine and metabolites    1000 Barbiturate and metabolites    200 Benzodiazepine                 [id number] Tricyclics and metabolites     300 Opiates and metabolites        300 Cocaine and metabolites        300 THC                            50 Performed at [facility] [place], [place] [address]   Urinalysis, Routine w reflex microscopic     Status: Abnormal   Collection Time: 07/23/19  6:23 AM  Result Value Ref Range   Color, Urine YELLOW YELLOW   APPearance HAZY (A) CLEAR   Specific Gravity, Urine 1.012 1.005 - 1.030   pH 6.0 5.0 - 8.0   Glucose, UA NEGATIVE NEGATIVE mg/dL   Hgb urine dipstick NEGATIVE NEGATIVE   Bilirubin Urine NEGATIVE NEGATIVE   Ketones, ur NEGATIVE NEGATIVE mg/dL   Protein, ur NEGATIVE NEGATIVE mg/dL   Nitrite NEGATIVE NEGATIVE   Leukocytes,Ua MODERATE (A) NEGATIVE   RBC / HPF 0-5 0 - 5 RBC/hpf   WBC, UA 11-20 0 - 5 WBC/hpf   Bacteria, UA RARE (A) NONE SEEN   Hyaline Casts, UA PRESENT     Comment: Performed at [facility], [address] [place], [place] [address]  Ethanol     Status: None   Collection Time: 07/23/19  6:29 AM  Result Value Ref Range   Alcohol, Ethyl (B) <10 <10 mg/dL    Comment: (NOTE) Lowest detectable limit for serum alcohol is 10 mg/dL. For medical purposes only. Performed at [facility], [address], [place], [place] [address]   Protime-INR     Status: None   Collection Time: 07/23/19  6:29 AM  Result Value Ref Range   Prothrombin Time 12.9  11.4 - 15.2 seconds   INR 1.0 0.8 - 1.2    Comment: (NOTE) INR goal varies based on device and disease states. Performed at [facility], [address] [place], [place] [address]   APTT     Status: None   Collection Time: 07/23/19  6:29 AM  Result Value Ref Range   aPTT 28 24 - 36 seconds    Comment: Performed at [facility], [address], [place], [place] [address]  CBC     Status: Abnormal   Collection Time: 07/23/19  6:29 AM  Result Value Ref Range   WBC 5.6 4.0 - 10.5 K/uL   RBC 3.55 (L) 4.22 - 5.81 MIL/uL   Hemoglobin 11.3 (L) 13.0 - 17.0 g/dL   HCT 35.4 (L) 39.0 - 52.0 %   MCV 99.7 80.0 - 100.0 fL   MCH 31.8 26.0 - 34.0 pg   MCHC 31.9 30.0 - 36.0 g/dL  RDW 13.2 11.5 - 15.5 %   Platelets 154 150 - 400 K/uL   nRBC 0.0 0.0 - 0.2 %    Comment: Performed at [facility], [address] [place], [place] [address]  Differential     Status: Abnormal   Collection Time: 07/23/19  6:29 AM  Result Value Ref Range   Neutrophils Relative % 82 %   Neutro Abs 4.6 1.7 - 7.7 K/uL   Lymphocytes Relative 9 %   Lymphs Abs 0.5 (L) 0.7 - 4.0 K/uL   Monocytes Relative 6 %   Monocytes Absolute 0.3 0.1 - 1.0 K/uL   Eosinophils Relative 2 %   Eosinophils Absolute 0.1 0.0 - 0.5 K/uL   Basophils Relative 1 %   Basophils Absolute 0.0 0.0 - 0.1 K/uL   Immature Granulocytes 0 %   Abs Immature Granulocytes 0.02 0.00 - 0.07 K/uL    Comment: Performed at [facility], [address] [place], [place] [address]  Comprehensive metabolic panel     Status: Abnormal   Collection Time: 07/23/19  6:29 AM  Result Value Ref Range   Sodium 141 135 - 145 mmol/L   Potassium 4.3 3.5 - 5.1 mmol/L   Chloride 101 98 - 111 mmol/L   CO2 28 22 - 32 mmol/L   Glucose, Bld 114 (H) 70 - 99 mg/dL    Comment: Glucose reference range applies only to samples taken after fasting for at least 8 hours.   BUN 13 8 - 23 mg/dL   Creatinine, Ser 0.71 0.61 - 1.24 mg/dL   Calcium 8.8 (L) 8.9 - 10.3 mg/dL   Total Protein 6.5  6.5 - 8.1 g/dL   Albumin 3.7 3.5 - 5.0 g/dL   AST 35 15 - 41 U/L   ALT 44 0 - 44 U/L   Alkaline Phosphatase 52 38 - 126 U/L   Total Bilirubin 0.6 0.3 - 1.2 mg/dL   GFR calc non Af Amer >60 >60 mL/min   GFR calc Af Amer >60 >60 mL/min   Anion gap 12 5 - 15    Comment: Performed at [facility], [address] [place], [place] [address]  Hemoglobin A1c     Status: Abnormal   Collection Time: 07/23/19  6:29 AM  Result Value Ref Range   Hgb A1c MFr Bld 5.9 (H) 4.8 - 5.6 %    Comment: (NOTE) Pre diabetes:          5.7%-6.4% Diabetes:              >6.4% Glycemic control for   <7.0% adults with diabetes    Mean Plasma Glucose 122.63 mg/dL    Comment: Performed at [facility] [address] [place], [place] [address]  TSH     Status: None   Collection Time: 07/23/19  6:29 AM  Result Value Ref Range   TSH 0.706 0.350 - 4.500 uIU/mL    Comment: Performed by a 3rd Generation assay with a functional sensitivity of <=0.01 uIU/mL. Performed at [facility], [address], [place], [place] [address]   Vitamin B12     Status: Abnormal   Collection Time: 07/23/19  6:29 AM  Result Value Ref Range   Vitamin B-12 >7,500 (H) 180 - 914 pg/mL    Comment: RESULTS CONFIRMED BY MANUAL DILUTION Performed at [facility], [address], [place], [place] [address]   Folate     Status: None   Collection Time: 07/23/19  6:29 AM  Result Value  Ref Range   Folate 29.7 >5.9 ng/mL    Comment: RESULTS CONFIRMED BY MANUAL DILUTION Performed at [facility], [address] [place], [place] [address]   Iron and TIBC     Status: Abnormal   Collection Time: 07/23/19  6:29 AM  Result Value Ref Range   Iron 21 (L) 45 - 182 ug/dL   TIBC 294 250 - 450 ug/dL   Saturation Ratios 7 (L) 17.9 - 39.5 %   UIBC 273 ug/dL    Comment: Performed at [facility], [address] [place], [place] [address]  Ferritin     Status: None   Collection Time: 07/23/19  6:29 AM  Result Value Ref Range   Ferritin 101 24 -  336 ng/mL    Comment: Performed at [facility], [address] [place], [place] [address]  I-stat chem 8, ED     Status: Abnormal   Collection Time: 07/23/19  6:35 AM  Result Value Ref Range   Sodium 140 135 - 145 mmol/L   Potassium 4.1 3.5 - 5.1 mmol/L   Chloride 101 98 - 111 mmol/L   BUN 12 8 - 23 mg/dL   Creatinine, Ser 0.70 0.61 - 1.24 mg/dL   Glucose, Bld 109 (H) 70 - 99 mg/dL    Comment: Glucose reference range applies only to samples taken after fasting for at least 8 hours.   Calcium, Ion 1.17 1.15 - 1.40 mmol/L   TCO2 31 22 - 32 mmol/L   Hemoglobin 11.2 (L) 13.0 - 17.0 g/dL   HCT 33.0 (L) 39.0 - 52.0 %  SARS Coronavirus 2 by RT PCR (hospital order, performed in [facility] hospital lab) Nasopharyngeal Nasopharyngeal Swab     Status: None   Collection Time: 07/23/19 10:32 AM   Specimen: Nasopharyngeal Swab  Result Value Ref Range   SARS Coronavirus 2 NEGATIVE NEGATIVE    Comment: (NOTE) SARS-CoV-2 target nucleic acids are NOT DETECTED. The SARS-CoV-2 RNA is generally detectable in upper and lower respiratory specimens during the acute phase of infection. The lowest concentration of SARS-CoV-2 viral copies this assay can detect is 250 copies / mL. A negative result does not preclude SARS-CoV-2 infection and should not be used as the sole basis for treatment or other patient management decisions.  A negative result may occur with improper specimen collection / handling, submission of specimen other than nasopharyngeal swab, presence of viral mutation(s) within the areas targeted by this assay, and inadequate number of viral copies (<250 copies / mL). A negative result must be combined with clinical observations, patient history, and epidemiological information. Fact Sheet for Patients:   StrictlyIdeas.no Fact Sheet for Healthcare Providers: BankingDealers.co.za This test is not yet approved or cleared  by the [place] FDA  and has been authorized for detection and/or diagnosis of SARS-CoV-2 by FDA under an Emergency Use Authorization (EUA).  This EUA will remain in effect (meaning this test can be used) for the duration of the COVID-19 declaration under Section 564(b)(1) of the Act, 21 U.S.C. section 360bbb-3(b)(1), unless the authorization is terminated or revoked sooner. Performed at [facility], [address], [place], [place] [address]   Lactic acid, plasma     Status: None   Collection Time: 07/23/19 10:59 AM  Result Value Ref Range   Lactic Acid, Venous 1.8 0.5 - 1.9 mmol/L    Comment: Performed at [facility], [address] [place], [place] [address]  Lactic acid, plasma     Status: None  Collection Time: 07/23/19 12:56 PM  Result Value Ref Range   Lactic Acid, Venous 1.4 0.5 - 1.9 mmol/L    Comment: Performed at [facility], [address] [place], [place] [address]  Reticulocytes     Status: Abnormal   Collection Time: 07/23/19  3:23 PM  Result Value Ref Range   Retic Ct Pct 1.4 0.4 - 3.1 %   RBC. 3.04 (L) 4.22 - 5.81 MIL/uL   Retic Count, Absolute 43.5 19.0 - 186.0 K/uL   Immature Retic Fract 8.7 2.3 - 15.9 %    Comment: Performed at [facility], [address], [place], [place] [address]    Studies/Results:   HEAD CT FINDINGS: Brain: No evidence of acute infarction, hemorrhage, hydrocephalus, extra-axial collection or mass lesion/mass effect. Progressive mild generalized cerebral atrophy.  Vascular: No hyperdense vessel or unexpected calcification.  Skull: Normal. Negative for fracture or focal lesion.  Sinuses/Orbits: Chronic partial opacification of the left frontal sinus and bilateral ethmoid air cells. Remaining paranasal sinuses and mastoid air cells are clear. The orbits are unremarkable.  Other: None.  IMPRESSION: 1. No acute intracranial abnormality. Progressive mild generalized cerebral atrophy. 2. Chronic sinusitis.   The head CT scan is reviewed in  person and shows no acute changes.  There are no hyperdensities, excessive fluid collection or lesions.     Denese Mentink A. Merlene Laughter, M.D.  Diplomate, Tax adviser of Psychiatry and Neurology ( Neurology). 07/23/2019, 4:45 PM

## 2019-07-23 NOTE — ED Triage Notes (Signed)
Pt also was biten by tick 2 days ago.

## 2019-07-23 NOTE — H&P (Signed)
Patient's anticipated LOS is less than 2 midnights, meeting these requirements: - Younger than 76 - Lives within 1 hour of care - Has a competent adult at home to recover with post-op recover - NO history of  - Chronic pain requiring opiods  - Diabetes  - Coronary Artery Disease  - Heart failure  - Heart attack  - Stroke  - DVT/VTE  - Cardiac arrhythmia  - Respiratory Failure/COPD  - Renal failure  - Anemia  - Advanced Liver disease       [name]. is an 74 y.o. male.    Chief Complaint: right shoulder pain  HPI: Pt is a 74 y.o. male complaining of right shoulder pain for multiple years. Pain had continually increased since the beginning. X-rays in the clinic show end-stage arthritic changes of the right shoulder. Pt has tried various conservative treatments which have failed to alleviate their symptoms, including injections and therapy. Various options are discussed with the patient. Risks, benefits and expectations were discussed with the patient. Patient understand the risks, benefits and expectations and wishes to proceed with surgery.   PCP:  Lajean Manes, MD  D/C Plans: Home  PMH: Past Medical History:  Diagnosis Date  . Arthritis   . Asthma    anxiety, tree/grass pollen  . BPH (benign prostatic hyperplasia)   . Bronchitis    hx of  . Bulge of cervical disc without myelopathy   . Chest pain    a. 06/2015 MV: EF 56%, no ischemia/infarct.  . Complication of anesthesia [id number]   no complications but just says he typically needs "more than expected" to anesthetize; needs CPAP (setting 10) in recovery  . Depression    ADD  . Diabetes mellitus    PCP [name], type 2  . Insomnia   . Mitral valve prolapse    a. 06/2015 Echo: EF 65-70%, mild LVH, no rwma, mild MVP of ant leaflet and mild to mod posteriorly directed MR, mildly dil La.  . Obesity   . PVC's (premature ventricular contractions)   . Recurrent upper respiratory infection (URI)    states Dr  Wynelle Link aware- no fever- states is improving  . Sleep apnea    settings 10.6  / followed by [name] study years ago  . Tremor    d/t lithium    PSH: Past Surgical History:  Procedure Laterality Date  . CARDIAC CATHETERIZATION     2008 pre op gastric bypass  . COLONOSCOPY W/ POLYPECTOMY    . 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 JOINT REPLACEMENT     Left, Right Knee, Right hip  . KNEE ARTHROSCOPY     right x 3-4, left x 2  . KNEE ARTHROTOMY  ~1991   right  . LUMBAR FUSION  04/03/2013   [name], L5-S1  . POLYPECTOMY     vocal cords 1992  . 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   Procedure: LEFT TOTAL HIP ARTHROPLASTY ANTERIOR APPROACH;  Surgeon: [name], MD;  Location: [facility];  Service: Orthopedics;  Laterality: Left;  . TOTAL HIP REVISION  04/22/2011   Procedure: TOTAL HIP REVISION;  Surgeon: Gearlean Alf, MD;  Location: [facility];  Service: Orthopedics;  Laterality: Right;    Social History:  reports that he quit smoking about 29 years ago. His smoking use included cigarettes. He has a 30.00 pack-year smoking history. He has never used smokeless tobacco. He reports that he does not drink alcohol or  use drugs.  Allergies:  Allergies  Allergen Reactions  . Breo Ellipta [Fluticasone Furoate-Vilanterol] Other (See Comments)    Thrush  . Demerol [Meperidine] Itching  . Lamotrigine Hives and Itching  . Nsaids Nausea Only    Gastric bypass    Medications: No current facility-administered medications for this encounter.   Current Outpatient Medications  Medication Sig Dispense Refill  . acetaminophen (TYLENOL) 500 MG tablet Take 1,000 mg by mouth daily as needed for headache.    . albuterol (VENTOLIN HFA) 108 (90 Base) MCG/ACT inhaler Inhale 2 puffs into the lungs every 6 (six) hours as needed for wheezing or shortness of breath. 18 g 3  . aspirin EC 81 MG tablet Take 81 mg by mouth daily as needed for mild pain.    . clonazePAM (KLONOPIN) 0.5 MG tablet Take 0.5 mg  by mouth 2 (two) times daily.     Marland Kitchen dutasteride (AVODART) 0.5 MG capsule Take 0.5 mg by mouth daily.    . fluticasone (FLONASE) 50 MCG/ACT nasal spray Place 2 sprays into the nose 2 (two) times daily.    Marland Kitchen HYDROcodone-acetaminophen (NORCO) 10-325 MG per tablet Take 1-2 tablets by mouth every 6 (six) hours as needed for moderate pain. (Patient taking differently: Take 1 tablet by mouth in the morning, at noon, and at bedtime. ) 90 tablet 0  . metFORMIN (GLUCOPHAGE) 500 MG tablet Take 500 mg by mouth 2 (two) times daily with a meal.     . methocarbamol (ROBAXIN) 500 MG tablet Take 1,000 mg by mouth at bedtime.     . Multiple Vitamins-Minerals (CENTRUM SILVER ADULT 50+ PO) Take 1 tablet by mouth daily.    . pioglitazone (ACTOS) 45 MG tablet Take 45 mg by mouth daily.     . pravastatin (PRAVACHOL) 40 MG tablet Take 40 mg by mouth daily.    Marland Kitchen UNABLE TO FIND Med Name: CPAP 10 cm      Results for orders placed or performed during the hospital encounter of 07/23/19 (from the past 48 hour(s))  Urine rapid drug screen (hosp performed)     Status: Abnormal   Collection Time: 07/23/19  6:23 AM  Result Value Ref Range   Opiates POSITIVE (A) NONE DETECTED   Cocaine NONE DETECTED NONE DETECTED   Benzodiazepines POSITIVE (A) NONE DETECTED   Amphetamines NONE DETECTED NONE DETECTED   Tetrahydrocannabinol NONE DETECTED NONE DETECTED   Barbiturates NONE DETECTED NONE DETECTED    Comment: (NOTE) DRUG SCREEN FOR MEDICAL PURPOSES ONLY.  IF CONFIRMATION IS NEEDED FOR ANY PURPOSE, NOTIFY LAB WITHIN 5 DAYS. LOWEST DETECTABLE LIMITS FOR URINE DRUG SCREEN Drug Class                     Cutoff (ng/mL) Amphetamine and metabolites    1000 Barbiturate and metabolites    200 Benzodiazepine                 [id number] Tricyclics and metabolites     300 Opiates and metabolites        300 Cocaine and metabolites        300 THC                            50 Performed at [facility], [address] [place], [place]  [address]   Urinalysis, Routine w reflex microscopic     Status: Abnormal   Collection Time: 07/23/19  6:23 AM  Result Value  Ref Range   Color, Urine YELLOW YELLOW   APPearance HAZY (A) CLEAR   Specific Gravity, Urine 1.012 1.005 - 1.030   pH 6.0 5.0 - 8.0   Glucose, UA NEGATIVE NEGATIVE mg/dL   Hgb urine dipstick NEGATIVE NEGATIVE   Bilirubin Urine NEGATIVE NEGATIVE   Ketones, ur NEGATIVE NEGATIVE mg/dL   Protein, ur NEGATIVE NEGATIVE mg/dL   Nitrite NEGATIVE NEGATIVE   Leukocytes,Ua MODERATE (A) NEGATIVE   RBC / HPF 0-5 0 - 5 RBC/hpf   WBC, UA 11-20 0 - 5 WBC/hpf   Bacteria, UA RARE (A) NONE SEEN   Hyaline Casts, UA PRESENT     Comment: Performed at [facility], [address] [place], [place] [address]  Ethanol     Status: None   Collection Time: 07/23/19  6:29 AM  Result Value Ref Range   Alcohol, Ethyl (B) <10 <10 mg/dL    Comment: (NOTE) Lowest detectable limit for serum alcohol is 10 mg/dL. For medical purposes only. Performed at [facility], [address], [place], [place] [address]   Protime-INR     Status: None   Collection Time: 07/23/19  6:29 AM  Result Value Ref Range   Prothrombin Time 12.9 11.4 - 15.2 seconds   INR 1.0 0.8 - 1.2    Comment: (NOTE) INR goal varies based on device and disease states. Performed at [facility], [address] [place], [place] [address]   APTT     Status: None   Collection Time: 07/23/19  6:29 AM  Result Value Ref Range   aPTT 28 24 - 36 seconds    Comment: Performed at [facility], [address] [place], [place] [address]  CBC     Status: Abnormal   Collection Time: 07/23/19  6:29 AM  Result Value Ref Range   WBC 5.6 4.0 - 10.5 K/uL   RBC 3.55 (L) 4.22 - 5.81 MIL/uL   Hemoglobin 11.3 (L) 13.0 - 17.0 g/dL   HCT 35.4 (L) 39.0 - 52.0 %   MCV 99.7 80.0 - 100.0 fL   MCH 31.8 26.0 - 34.0 pg   MCHC 31.9 30.0 - 36.0 g/dL   RDW 13.2 11.5 - 15.5 %   Platelets 154 150 - 400 K/uL   nRBC 0.0 0.0 - 0.2 %    Comment: Performed at  [facility], [address] [place], [place] [address]  Differential     Status: Abnormal   Collection Time: 07/23/19  6:29 AM  Result Value Ref Range   Neutrophils Relative % 82 %   Neutro Abs 4.6 1.7 - 7.7 K/uL   Lymphocytes Relative 9 %   Lymphs Abs 0.5 (L) 0.7 - 4.0 K/uL   Monocytes Relative 6 %   Monocytes Absolute 0.3 0.1 - 1.0 K/uL   Eosinophils Relative 2 %   Eosinophils Absolute 0.1 0.0 - 0.5 K/uL   Basophils Relative 1 %   Basophils Absolute 0.0 0.0 - 0.1 K/uL   Immature Granulocytes 0 %   Abs Immature Granulocytes 0.02 0.00 - 0.07 K/uL    Comment: Performed at [facility], [address] [place],  [address]  Comprehensive metabolic panel     Status: Abnormal   Collection Time: 07/23/19  6:29 AM  Result Value Ref Range   Sodium 141 135 - 145 mmol/L   Potassium 4.3 3.5 - 5.1 mmol/L   Chloride 101 98 - 111 mmol/L   CO2 28 22 - 32 mmol/L   Glucose, Bld 114 (H)  70 - 99 mg/dL    Comment: Glucose reference range applies only to samples taken after fasting for at least 8 hours.   BUN 13 8 - 23 mg/dL   Creatinine, Ser 0.71 0.61 - 1.24 mg/dL   Calcium 8.8 (L) 8.9 - 10.3 mg/dL   Total Protein 6.5 6.5 - 8.1 g/dL   Albumin 3.7 3.5 - 5.0 g/dL   AST 35 15 - 41 U/L   ALT 44 0 - 44 U/L   Alkaline Phosphatase 52 38 - 126 U/L   Total Bilirubin 0.6 0.3 - 1.2 mg/dL   GFR calc non Af Amer >60 >60 mL/min   GFR calc Af Amer >60 >60 mL/min   Anion gap 12 5 - 15    Comment: Performed at [facility], [address] [place], [place] [address]  I-stat chem 8, ED     Status: Abnormal   Collection Time: 07/23/19  6:35 AM  Result Value Ref Range   Sodium 140 135 - 145 mmol/L   Potassium 4.1 3.5 - 5.1 mmol/L   Chloride 101 98 - 111 mmol/L   BUN 12 8 - 23 mg/dL   Creatinine, Ser 0.70 0.61 - 1.24 mg/dL   Glucose, Bld 109 (H) 70 - 99 mg/dL    Comment: Glucose reference range applies only to samples taken after fasting for at least 8 hours.   Calcium, Ion 1.17 1.15 - 1.40 mmol/L    TCO2 31 22 - 32 mmol/L   Hemoglobin 11.2 (L) 13.0 - 17.0 g/dL   HCT 33.0 (L) 39.0 - 52.0 %   DG Chest 1 View  Result Date: 07/23/2019 CLINICAL DATA:  Weakness, lower back pain, fell 3 times yesterday, diabetes mellitus, asthma, former smoker EXAM: CHEST  1 VIEW COMPARISON:  08/22/2018 FINDINGS: Upper normal heart size with slight vascular congestion. Mediastinal contours normal. Atherosclerotic calcification aorta. Chronic interstitial changes at the mid to lower lungs greatest at LEFT base. No acute infiltrate, pleural effusion or pneumothorax. Bones demineralized. IMPRESSION: Chronic interstitial changes. No acute abnormalities. Electronically Signed   By: Lavonia Dana M.D.   On: 07/23/2019 08:36   DG Lumbar Spine Complete  Result Date: 07/23/2019 CLINICAL DATA:  Weakness, lower back pain, fell 3 times yesterday, history BILATERAL hip replacements, lumbar fusion, diabetes mellitus, smoker EXAM: LUMBAR SPINE - COMPLETE 4+ VIEW COMPARISON:  01/27/2014 FINDINGS: Osseous demineralization. Five lumbar vertebra. Disc space narrowing and facet degenerative changes L4-L5. Grade 1 anterolisthesis L4-L5 new since prior exam. Chronic anterolisthesis L5-S1 post posterior lumbar fusion. Vertebral body heights maintained. No fracture, bone destruction or definite spondylolysis. SI joints preserved. BILATERAL hip prostheses. Increased stool in colon in pelvis. IMPRESSION: Prior L5-S1 fusion. Increased disc space narrowing and facet degenerative changes L4-L5 with new grade 1 anterolisthesis. Electronically Signed   By: Lavonia Dana M.D.   On: 07/23/2019 08:32   CT HEAD WO CONTRAST  Result Date: 07/23/2019 CLINICAL DATA:  Fall. EXAM: CT HEAD WITHOUT CONTRAST TECHNIQUE: Contiguous axial images were obtained from the base of the skull through the vertex without intravenous contrast. COMPARISON:  CT head dated March 04, 2013. FINDINGS: Brain: No evidence of acute infarction, hemorrhage, hydrocephalus, extra-axial  collection or mass lesion/mass effect. Progressive mild generalized cerebral atrophy. Vascular: No hyperdense vessel or unexpected calcification. Skull: Normal. Negative for fracture or focal lesion. Sinuses/Orbits: Chronic partial opacification of the left frontal sinus and bilateral ethmoid air cells. Remaining paranasal sinuses and mastoid air cells are clear. The orbits are unremarkable. Other: None. IMPRESSION: 1.  No acute intracranial abnormality. Progressive mild generalized cerebral atrophy. 2. Chronic sinusitis. Electronically Signed   By: Titus Dubin M.D.   On: 07/23/2019 08:07   DG Knee Complete 4 Views Right  Result Date: 07/23/2019 CLINICAL DATA:  RIGHT knee pain, fell 3 times yesterday EXAM: RIGHT KNEE - COMPLETE 4+ VIEW COMPARISON:  None FINDINGS: Osseous demineralization. Components of a RIGHT knee prosthesis are identified. Joint effusion present with a few small calcifications anteriorly at the suprapatellar region, some of which are extra-articular, additional of which may represent intra-articular loose bodies. Question nondisplaced fracture of the superior margin of the medial femoral condyle. No periprosthetic lucency. IMPRESSION: Osseous demineralization with RIGHT knee prosthesis. RIGHT knee joint effusion with question of a nondisplaced fracture of the medial femoral condyle. Electronically Signed   By: Lavonia Dana M.D.   On: 07/23/2019 08:35    ROS: Pain with rom of the right upper extremity  Physical Exam: Alert and oriented 74 y.o. male in no acute distress Cranial nerves 2-12 intact Cervical spine: full rom with no tenderness, nv intact distally Chest: active breath sounds bilaterally, no wheeze rhonchi or rales Heart: regular rate and rhythm, no murmur Abd: non tender non distended with active bowel sounds Hip is stable with rom  Right shoulder painful rom  Moderate weakness with ER and IR No rashes or edema distally  Assessment/Plan Assessment: right shoulder  cuff arthropathy  Plan:  Patient will undergo a right reverse total shoulder by Dr. Veverly Fells at [facility] Risks benefits and expectations were discussed with the patient. Patient understand risks, benefits and expectations and wishes to proceed. Preoperative templating of the joint replacement has been completed, documented, and submitted to the Operating Room personnel in order to optimize intra-operative equipment management.   Merla Riches PA-C, MPAS [facility] is now [employer] [address], [place], [place], [place] [address] Phone: [phone] [email or web address] Facebook  [employer]

## 2019-07-24 ENCOUNTER — Inpatient Hospital Stay (HOSPITAL_COMMUNITY): Payer: Medicare Other

## 2019-07-24 DIAGNOSIS — M5030 Other cervical disc degeneration, unspecified cervical region: Secondary | ICD-10-CM | POA: Diagnosis present

## 2019-07-24 DIAGNOSIS — M5020 Other cervical disc displacement, unspecified cervical region: Secondary | ICD-10-CM | POA: Diagnosis present

## 2019-07-24 DIAGNOSIS — W19XXXD Unspecified fall, subsequent encounter: Secondary | ICD-10-CM

## 2019-07-24 DIAGNOSIS — S72414A Nondisplaced unspecified condyle fracture of lower end of right femur, initial encounter for closed fracture: Secondary | ICD-10-CM

## 2019-07-24 DIAGNOSIS — R262 Difficulty in walking, not elsewhere classified: Secondary | ICD-10-CM

## 2019-07-24 LAB — GLUCOSE, CAPILLARY
Glucose-Capillary: 126 mg/dL — ABNORMAL HIGH (ref 70–99)
Glucose-Capillary: 126 mg/dL — ABNORMAL HIGH (ref 70–99)
Glucose-Capillary: 138 mg/dL — ABNORMAL HIGH (ref 70–99)
Glucose-Capillary: 139 mg/dL — ABNORMAL HIGH (ref 70–99)

## 2019-07-24 LAB — BASIC METABOLIC PANEL
Anion gap: 9 (ref 5–15)
BUN: 19 mg/dL (ref 8–23)
CO2: 24 mmol/L (ref 22–32)
Calcium: 7.9 mg/dL — ABNORMAL LOW (ref 8.9–10.3)
Chloride: 103 mmol/L (ref 98–111)
Creatinine, Ser: 0.68 mg/dL (ref 0.61–1.24)
GFR calc Af Amer: >60 mL/min (ref >60)
GFR calc non Af Amer: >60 mL/min (ref >60)
Glucose, Bld: 127 mg/dL — ABNORMAL HIGH (ref 70–99)
Potassium: 3.7 mmol/L (ref 3.5–5.1)
Sodium: 136 mmol/L (ref 135–145)

## 2019-07-24 LAB — CBC
HCT: 28 % — ABNORMAL LOW (ref 39.0–52.0)
Hemoglobin: 9.1 g/dL — ABNORMAL LOW (ref 13.0–17.0)
MCH: 31.4 pg (ref 26.0–34.0)
MCHC: 32.5 g/dL (ref 30.0–36.0)
MCV: 96.6 fL (ref 80.0–100.0)
Platelets: 121 10*3/uL — ABNORMAL LOW (ref 150–400)
RBC: 2.9 MIL/uL — ABNORMAL LOW (ref 4.22–5.81)
RDW: 13.2 % (ref 11.5–15.5)
WBC: 4.5 10*3/uL (ref 4.0–10.5)
nRBC: 0 % (ref 0.0–0.2)

## 2019-07-24 LAB — MAGNESIUM: Magnesium: 1.8 mg/dL (ref 1.7–2.4)

## 2019-07-24 MED ORDER — CALCIUM CARBONATE ANTACID 500 MG PO CHEW
1.0000 | CHEWABLE_TABLET | ORAL | Status: DC | PRN
  Administered 2019-07-24: 200 mg via ORAL

## 2019-07-24 MED ORDER — LORAZEPAM 2 MG/ML IJ SOLN
1.0000 mg | INTRAMUSCULAR | Status: DC | PRN
  Administered 2019-07-24 (×2): 1 mg via INTRAVENOUS
  Filled 2019-07-24 (×2): qty 1

## 2019-07-24 NOTE — Evaluation (Signed)
Physical Therapy Evaluation Patient Details Name: [name]. MRN: [id number] DOB: Dec 10, 1945 Today's Date: 07/24/2019   History of Present Illness  [name]. is a 74 y.o. male with medical history significant for anxiety/depression, BPH, type 2 diabetes, and lumbar fusion with multiple joint replacements, who presented to the ED with over 3 falls in the last 24 hours.  He states that he began developing some intermittent lower extremity weakness approximately 2 weeks ago when he was walking through a hardware store and his legs suddenly froze up.  He is noted significant trouble with stiffness of his lower extremities as well as weakness and has had some urinary incontinence as well.  His wife at the bedside states that he is also been quite confused and has been having issues with his memory in the last 1 week.  He denies any headaches, dizziness, syncopal episodes, fevers, or chills.  He has had quite an ataxic gait and has bumped his knee at home which has caused significant pain in his knee.  He states that his back pain has worsened as well.    Clinical Impression  Patient presents with immobilizer to right knee and demonstrates good return for moving RLE during bed mobility, able to transfer without AD, but requires leaning on nearby objects for support, safer using RW and demonstrates slightly labored cadence without loss of balance during ambulation and limited due to fatigue.  Patient tolerated sitting up in chair after therapy - RN notified.  Patient will benefit from continued physical therapy in hospital and recommended venue below to increase strength, balance, endurance for safe ADLs and gait.     Follow Up Recommendations Home health PT;Supervision for mobility/OOB;Supervision - Intermittent    Equipment Recommendations  None recommended by PT    Recommendations for Other Services       Precautions / Restrictions Precautions Precautions: Fall Required Braces  or Orthoses: Knee Immobilizer - Right Knee Immobilizer - Right: On when out of bed or walking Restrictions Weight Bearing Restrictions: No      Mobility  Bed Mobility Overal bed mobility: Modified Independent                Transfers Overall transfer level: Needs assistance Equipment used: Rolling walker (2 wheeled);None Transfers: Sit to/from [employer] to Stand: Supervision Stand pivot transfers: Supervision;Min guard       General transfer comment: inceased time labored movement, has to lean on nearby objects for support during transfers without AD  Ambulation/Gait Ambulation/Gait assistance: Supervision Gait Distance (Feet): 80 Feet Assistive device: Rolling walker (2 wheeled) Gait Pattern/deviations: Decreased step length - right;Decreased stance time - right;Decreased stride length;Antalgic Gait velocity: decreased   General Gait Details: slow slightly labored cadence without loss of balance while wearing right knee immobilizer  Stairs            Wheelchair Mobility    Modified Rankin (Stroke Patients Only)       Balance Overall balance assessment: Needs assistance Sitting-balance support: Feet supported;No upper extremity supported Sitting balance-Leahy Scale: Good Sitting balance - Comments: seated at EOB   Standing balance support: During functional activity;No upper extremity supported Standing balance-Leahy Scale: Poor Standing balance comment: fair/poor without AD, fair/good using RW                             Pertinent Vitals/Pain Pain Assessment: Faces Faces Pain Scale: Hurts little more Pain Location: right knee  Pain Descriptors / Indicators: Sore;Discomfort Pain Intervention(s): Limited activity within patient's tolerance;Monitored during session    Home Living Family/patient expects to be discharged to:: Private residence Living Arrangements: Spouse/significant other Available Help at  Discharge: Family;Available 24 hours/day Type of Home: House Home Access: Stairs to enter Entrance Stairs-Rails: None Entrance Stairs-Number of Steps: 1 Home Layout: One level Home Equipment: Walker - 2 wheels;Cane - single point;Wheelchair - manual;Shower seat;Hospital bed;Bedside commode      Prior Function Level of Independence: Independent         Comments: Hydrographic surveyor, drives     Hand Dominance   Dominant Hand: Right    Extremity/Trunk Assessment   Upper Extremity Assessment Upper Extremity Assessment: Overall WFL for tasks assessed;RUE deficits/detail RUE Deficits / Details: Right shoulder grossly -3/5 (baseline due to chronic weakness, planning to have surgery), rest of RUE grossly 4/5    Lower Extremity Assessment Lower Extremity Assessment: RLE deficits/detail;Generalized weakness;LLE deficits/detail RLE Deficits / Details: grossly -4/5 except right knee not tested due to in immobilizer RLE: Unable to fully assess due to immobilization;Unable to fully assess due to pain RLE Sensation: WNL RLE Coordination: WNL LLE Deficits / Details: grossly 5/5 LLE Sensation: WNL LLE Coordination: WNL    Cervical / Trunk Assessment Cervical / Trunk Assessment: Normal  Communication   Communication: No difficulties  Cognition Arousal/Alertness: Awake/alert Behavior During Therapy: WFL for tasks assessed/performed Overall Cognitive Status: Within Functional Limits for tasks assessed                                        General Comments      Exercises     Assessment/Plan    PT Assessment Patient needs continued PT services  PT Problem List Decreased strength;Decreased activity tolerance;Decreased balance;Decreased mobility       PT Treatment Interventions Gait training;Stair training;Functional mobility training;Therapeutic activities;Therapeutic exercise;Patient/family education    PT Goals (Current goals can be found in the Care Plan  section)  Acute Rehab PT Goals Patient Stated Goal: return home with spouse to assist PT Goal Formulation: With patient Time For Goal Achievement: 07/29/19 Potential to Achieve Goals: Good    Frequency Min 3X/week   Barriers to discharge        Co-evaluation               AM-PAC PT "6 Clicks" Mobility  Outcome Measure Help needed turning from your back to your side while in a flat bed without using bedrails?: None Help needed moving from lying on your back to sitting on the side of a flat bed without using bedrails?: None Help needed moving to and from a bed to a chair (including a wheelchair)?: A Little Help needed standing up from a chair using your arms (e.g., wheelchair or bedside chair)?: A Little Help needed to walk in hospital room?: A Little Help needed climbing 3-5 steps with a railing? : A Lot 6 Click Score: 19    End of Session   Activity Tolerance: Patient tolerated treatment well;Patient limited by fatigue Patient left: in chair;with call bell/phone within reach Nurse Communication: Mobility status PT Visit Diagnosis: Unsteadiness on feet (R26.81);Other abnormalities of gait and mobility (R26.89);Muscle weakness (generalized) (M62.81)    Time: [id number] PT Time Calculation (min) (ACUTE ONLY): 27 min   Charges:   PT Evaluation $PT Eval Moderate Complexity: 1 Mod PT Treatments $Therapeutic Activity: 23-37 mins  2:09 PM, 07/24/19 Lonell Grandchild, MPT Physical Therapist with [facility] 336 [phone] office [phone] mobile phone

## 2019-07-24 NOTE — TOC Initial Note (Signed)
Transition of Care (TOC) - Initial/Assessment Note    Patient Details  Name: [name]. MRN: [id number] Date of Birth: 05-Jul-1945  Transition of Care [facility]) CM/SW Contact:    Ihor Gully, LCSW Phone Number: 07/24/2019, 1:25 PM  Clinical Narrative:                 PT recommeded HHPT. Patient agreeable. Referral made.   Expected Discharge Plan: [place] Barriers to Discharge: Continued Medical Work up   Patient Goals and CMS Choice Patient states their goals for this hospitalization and ongoing recovery are:: home with home health. CMS Medicare.gov Compare Post Acute Care list provided to:: Patient Choice offered to / list presented to : Patient  Expected Discharge Plan and Services Expected Discharge Plan: [place]: PT [place]: [place] ([place]) Date [place]: 07/24/19 Time [place]: [id number] Representative spoke with at [place]: Vaughan Basta  Prior Living Arrangements/Services       Do you feel safe going back to the place where you live?: Yes               Activities of Daily Living Home Assistive Devices/Equipment: Cane (specify quad or straight), CBG Meter, CPAP, Dentures (specify type), Eyeglasses, Walker (specify type), Wheelchair ADL Screening (condition at time of admission) Patient's cognitive ability adequate to safely complete daily activities?: Yes Is the patient deaf or have difficulty hearing?: No Does the patient have difficulty seeing, even when wearing glasses/contacts?: No Does the patient have difficulty concentrating, remembering, or making decisions?: No Patient able to express need for assistance with ADLs?: Yes Does the patient have difficulty dressing or bathing?: No Independently performs ADLs?: Yes (appropriate for developmental age) Does the patient have difficulty walking or climbing stairs?:  Yes Weakness of Legs: Both Weakness of Arms/Hands: None  Permission Sought/Granted                  Emotional Assessment              Admission diagnosis:  Fever [R50.9] Acute cystitis without hematuria [N30.00] Falls [W19.XXXA] Fall, initial encounter [id number]] Closed nondisplaced fracture of condyle of right femur, initial encounter [facility]) [S72.414A] Ambulatory dysfunction [R26.2] Patient Active Problem List   Diagnosis Date Noted  . Falls 07/23/2019  . Cough 02/20/2019  . Absolute anemia 02/04/2019  . Iron deficiency anemia 02/04/2019  . Abnormal findings on diagnostic imaging of lung 11/27/2018  . ILD (interstitial lung disease) ([place]) 11/27/2018  . Mild obesity 08/05/2017  . Type 2 diabetes mellitus treated without insulin ([place]) 08/05/2017  . MVP (mitral valve prolapse) 08/05/2017  . Orthostatic hypotension 01/18/2017  . Upper airway cough syndrome 11/23/2016  . Cough variant asthma 11/23/2016  . 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 Left ventricular hypertrophy 10/03/2015  . Mixed hyperlipidemia 10/03/2015  . PVCs (premature ventricular contractions) 07/13/2015  . Obesity (BMI 30.0-34.9) 07/13/2015  . Chest pain 06/29/2015  . Uncontrolled diabetes mellitus without complication, without long-term current use of insulin 06/29/2015  . Depression 06/29/2015  . BPH (benign prostatic hyperplasia) 06/29/2015  . OSA on CPAP 12/26/2006   PCP:  Lajean Manes, MD Pharmacy:   [id number] [place], [address]  [place] [address] Phone: [phone] Fax: [phone]  [place] [address] [place] [id number] [place] [facility] [place] [address] Phone: [phone] Fax: [phone]  [place] [address] [place] [address] [place] [address] Phone: [phone] Fax:  [phone]  [place], [place] [address] Phone: [phone] Fax: [phone]     Social Determinants of Health (SDOH) Interventions    Readmission Risk Interventions No flowsheet data found.

## 2019-07-24 NOTE — Plan of Care (Signed)
  Problem: Acute Rehab PT Goals(only PT should resolve) Goal: Pt Will Go Supine/Side To Sit Outcome: Progressing Flowsheets (Taken 07/24/2019 1413) Pt will go Supine/Side to Sit:  Independently  with modified independence Goal: Patient Will Transfer Sit To/From Stand Outcome: Progressing Flowsheets (Taken 07/24/2019 1413) Patient will transfer sit to/from stand:  with modified independence  with supervision Goal: Pt Will Transfer Bed To Chair/Chair To Bed Outcome: Progressing Flowsheets (Taken 07/24/2019 1413) Pt will Transfer Bed to Chair/Chair to Bed:  with modified independence  with supervision Goal: Pt Will Ambulate Outcome: Progressing Flowsheets (Taken 07/24/2019 1413) Pt will Ambulate:  > 125 feet  with supervision  with modified independence  with rolling walker   2:13 PM, 07/24/19 Lonell Grandchild, MPT Physical Therapist with [facility] [phone] office [phone] mobile phone

## 2019-07-24 NOTE — Progress Notes (Signed)
Pt has home CPAP and will place on self shortly.  RT will continue to monitor.

## 2019-07-24 NOTE — Progress Notes (Signed)
PROGRESS NOTE   [name].  GR:[id number] DOB: 03-19-1945 DOA: 07/23/2019 PCP: Lajean Manes, MD   Chief Complaint  Patient presents with  . Fall    Brief Narrative:  74 y.o. male with medical history significant for anxiety/depression, BPH, type 2 diabetes, and lumbar fusion with multiple joint replacements, who presented to the ED with over 3 falls in the last 24 hours.  He states that he began developing some intermittent lower extremity weakness approximately 2 weeks ago when he was walking through a hardware store and his legs suddenly froze up.    Assessment & Plan:   Active Problems:   OSA on CPAP   BPH (benign prostatic hyperplasia)   Orthostatic hypotension   Type 2 diabetes mellitus treated without insulin (HCC)   ILD (interstitial lung disease) (HCC)   Iron deficiency anemia   Falls   Ambulatory dysfunction   Closed nondisplaced fracture of condyle of right femur (HCC)   Bulge of cervical disc without myelopathy   1. Acute encephalopathy -we will make another attempt to get an MRI brain as the one attempted yesterday was unable to be read due to motion degradation.  Appreciate neurology consultation and recommendations MRI and lab studies are pending.  He has much improved symptoms today and seems to be back to his baseline mentation. 2. Fever with UTI-continue ceftriaxone.  Temperature defervesced at this time. 3. Right femoral condyle fracture nondisplaced secondary to fall-he remains in a immobilizer.  Outpatient orthopedic follow-up. 4. Normocytic anemia-stable. 5. Chronic cervical disc disease-MRI of the cervical thoracic and lumbar spine ordered to rule out myelopathy. 6. Diabetes mellitus-holding home oral medications treating with SSI coverage and carb modified diet. 7. Diabetic peripheral neuropathy-stable on home gabapentin. 8. Anxiety depression-bupropion temporarily on hold per recommendations of neurologist.  Klonopin for anxiety as  needed. 9. BPH-resume Flomax Avodart and finish steroid. 10. Lower extremity weakness-this is improving as well.  MRI of the lumbar spine ordered.  Difficult to assess strength in the right lower extremity due to the immobilizer that is in place. 11. Hypotension - He responded well to fluid boluses and has been resumed on his home midodrine with good results.   DVT prophylaxis: Lovenox Code Status: Full Family Communication: Wife telephone Disposition:   Status is: Inpatient  Remains inpatient appropriate because:Ongoing diagnostic testing needed not appropriate for outpatient work up   Dispo: The patient is from: Home              Anticipated d/c is to: Home              Anticipated d/c date is: 1 day              Patient currently is not medically stable to d/c.        Consultants:   Neurology   Procedures:   MRI   Antimicrobials: ceftriaxone     Subjective: Patient reports that his confusion is a lot better today and he is having more strength in the left lower extremity than yesterday and difficult to assess the right lower extremity due to being in the immobilizer.  Objective: Vitals:   07/24/19 1108 07/24/19 1400 07/24/19 1626 07/24/19 1627  BP:  91/62 109/62   Pulse: 62 (!) 54    Resp: 13 (!) 22 16 16   Temp: 98.1 F (36.7 C)   98.3 F (36.8 C)  TempSrc: Oral   Oral  SpO2: 98%     Weight:  Height:        Intake/Output Summary (Last 24 hours) at 07/24/2019 1657 Last data filed at 07/24/2019 0943 Gross per 24 hour  Intake 760 ml  Output --  Net 760 ml   Filed Weights   07/23/19 0606 07/23/19 1629 07/24/19 0500  Weight: 93.9 kg 103.1 kg 103.5 kg    Examination:  General exam: Appears calm and comfortable  Respiratory system: Clear to auscultation. Respiratory effort normal. Cardiovascular system: S1 & S2 heard, RRR. No JVD, murmurs, rubs, gallops or clicks. No pedal edema. Gastrointestinal system: Abdomen is nondistended, soft and nontender.  No organomegaly or masses felt. Normal bowel sounds heard. Central nervous system: Alert and oriented. No focal neurological deficits. Extremities: LLL 4/5 strength, RLE 3-4/5 strength. Skin: No rashes, lesions or ulcers Psychiatry: Judgement and insight appear normal. Mood & affect appropriate.   Data Reviewed: I have personally reviewed following labs and imaging studies  CBC: Recent Labs  Lab 07/23/19 0629 07/23/19 0635 07/24/19 0417  WBC 5.6  --  4.5  NEUTROABS 4.6  --   --   HGB 11.3* 11.2* 9.1*  HCT 35.4* 33.0* 28.0*  MCV 99.7  --  96.6  PLT 154  --  121*    Basic Metabolic Panel: Recent Labs  Lab 07/23/19 0629 07/23/19 0635 07/24/19 0417  NA 141 140 136  K 4.3 4.1 3.7  CL 101 101 103  CO2 28  --  24  GLUCOSE 114* 109* 127*  BUN 13 12 19   CREATININE 0.71 0.70 0.68  CALCIUM 8.8*  --  7.9*  MG  --   --  1.8    GFR: Estimated Creatinine Clearance: 107.1 mL/min (by C-G formula based on SCr of 0.68 mg/dL).  Liver Function Tests: Recent Labs  Lab 07/23/19 0629  AST 35  ALT 44  ALKPHOS 52  BILITOT 0.6  PROT 6.5  ALBUMIN 3.7    CBG: Recent Labs  Lab 07/23/19 1716 07/23/19 2116 07/24/19 0748 07/24/19 1107 07/24/19 1625  GLUCAP 154* 126* 126* 126* 138*     Recent Results (from the past 240 hour(s))  Urine culture     Status: Abnormal (Preliminary result)   Collection Time: 07/23/19  6:29 AM   Specimen: Urine, Random  Result Value Ref Range Status   Specimen Description   Final    URINE, RANDOM Performed at [facility], [address] [place], [place] [address]    Special Requests   Final    NONE Performed at [facility], [address], [place], [place] [address]    Culture (A)  Final    >=100,000 COLONIES/mL ENTEROBACTER SPECIES CULTURE REINCUBATED FOR BETTER GROWTH Performed at [facility], [place] [address], [place], [place] [address]    Report Status PENDING  Incomplete  Culture, blood (routine x 2)     Status: None  (Preliminary result)   Collection Time: 07/23/19  9:36 AM   Specimen: BLOOD  Result Value Ref Range Status   Specimen Description BLOOD LEFT ANTECUBITAL  Final   Special Requests   Final    BOTTLES DRAWN AEROBIC AND ANAEROBIC Blood Culture results may not be optimal due to an inadequate volume of blood received in culture bottles   Culture   Final    NO GROWTH < 24 HOURS Performed at [facility], [address] [place], [place] [address]    Report Status PENDING  Incomplete  Culture, blood (routine x 2)     Status: None (Preliminary result)  Collection Time: 07/23/19  9:43 AM   Specimen: BLOOD RIGHT HAND  Result Value Ref Range Status   Specimen Description BLOOD RIGHT HAND  Final   Special Requests   Final    BOTTLES DRAWN AEROBIC AND ANAEROBIC Blood Culture results may not be optimal due to an inadequate volume of blood received in culture bottles   Culture   Final    NO GROWTH < 24 HOURS Performed at [facility], [address], [place], [place] [address]    Report Status PENDING  Incomplete  SARS Coronavirus 2 by RT PCR (hospital order, performed in [place] hospital lab) Nasopharyngeal Nasopharyngeal Swab     Status: None   Collection Time: 07/23/19 10:32 AM   Specimen: Nasopharyngeal Swab  Result Value Ref Range Status   SARS Coronavirus 2 NEGATIVE NEGATIVE Final    Comment: (NOTE) SARS-CoV-2 target nucleic acids are NOT DETECTED. The SARS-CoV-2 RNA is generally detectable in upper and lower respiratory specimens during the acute phase of infection. The lowest concentration of SARS-CoV-2 viral copies this assay can detect is 250 copies / mL. A negative result does not preclude SARS-CoV-2 infection and should not be used as the sole basis for treatment or other patient management decisions.  A negative result may occur with improper specimen collection / handling, submission of specimen other than nasopharyngeal swab, presence of viral mutation(s) within the areas  targeted by this assay, and inadequate number of viral copies (<250 copies / mL). A negative result must be combined with clinical observations, patient history, and epidemiological information. Fact Sheet for Patients:   StrictlyIdeas.no Fact Sheet for Healthcare Providers: BankingDealers.co.za This test is not yet approved or cleared  by the [place] FDA and has been authorized for detection and/or diagnosis of SARS-CoV-2 by FDA under an Emergency Use Authorization (EUA).  This EUA will remain in effect (meaning this test can be used) for the duration of the COVID-19 declaration under Section 564(b)(1) of the Act, 21 U.S.C. section 360bbb-3(b)(1), unless the authorization is terminated or revoked sooner. Performed at [facility], [address] [place], [place] [address]   MRSA PCR Screening     Status: None   Collection Time: 07/23/19  4:53 PM   Specimen: Nasal Mucosa; Nasopharyngeal  Result Value Ref Range Status   MRSA by PCR NEGATIVE NEGATIVE Final    Comment:        The GeneXpert MRSA Assay (FDA approved for NASAL specimens only), is one component of a comprehensive MRSA colonization surveillance program. It is not intended to diagnose MRSA infection nor to guide or monitor treatment for MRSA infections. Performed at [facility], [address] [place], [place] [address]     Radiology Studies: DG Chest 1 View  Result Date: 07/23/2019 CLINICAL DATA:  Weakness, lower back pain, fell 3 times yesterday, diabetes mellitus, asthma, former smoker EXAM: CHEST  1 VIEW COMPARISON:  08/22/2018 FINDINGS: Upper normal heart size with slight vascular congestion. Mediastinal contours normal. Atherosclerotic calcification aorta. Chronic interstitial changes at the mid to lower lungs greatest at LEFT base. No acute infiltrate, pleural effusion or pneumothorax. Bones demineralized. IMPRESSION: Chronic interstitial changes. No acute  abnormalities. Electronically Signed   By: Lavonia Dana M.D.   On: 07/23/2019 08:36   DG Lumbar Spine Complete  Result Date: 07/23/2019 CLINICAL DATA:  Weakness, lower back pain, fell 3 times yesterday, history BILATERAL hip replacements, lumbar fusion, diabetes mellitus, smoker EXAM: LUMBAR SPINE - COMPLETE 4+ VIEW COMPARISON:  01/27/2014 FINDINGS: Osseous demineralization.  Five lumbar vertebra. Disc space narrowing and facet degenerative changes L4-L5. Grade 1 anterolisthesis L4-L5 new since prior exam. Chronic anterolisthesis L5-S1 post posterior lumbar fusion. Vertebral body heights maintained. No fracture, bone destruction or definite spondylolysis. SI joints preserved. BILATERAL hip prostheses. Increased stool in colon in pelvis. IMPRESSION: Prior L5-S1 fusion. Increased disc space narrowing and facet degenerative changes L4-L5 with new grade 1 anterolisthesis. Electronically Signed   By: Lavonia Dana M.D.   On: 07/23/2019 08:32   CT HEAD WO CONTRAST  Result Date: 07/23/2019 CLINICAL DATA:  Fall. EXAM: CT HEAD WITHOUT CONTRAST TECHNIQUE: Contiguous axial images were obtained from the base of the skull through the vertex without intravenous contrast. COMPARISON:  CT head dated March 04, 2013. FINDINGS: Brain: No evidence of acute infarction, hemorrhage, hydrocephalus, extra-axial collection or mass lesion/mass effect. Progressive mild generalized cerebral atrophy. Vascular: No hyperdense vessel or unexpected calcification. Skull: Normal. Negative for fracture or focal lesion. Sinuses/Orbits: Chronic partial opacification of the left frontal sinus and bilateral ethmoid air cells. Remaining paranasal sinuses and mastoid air cells are clear. The orbits are unremarkable. Other: None. IMPRESSION: 1. No acute intracranial abnormality. Progressive mild generalized cerebral atrophy. 2. Chronic sinusitis. Electronically Signed   By: Titus Dubin M.D.   On: 07/23/2019 08:07   MR BRAIN WO CONTRAST  Result  Date: 07/24/2019 CLINICAL DATA:  Encephalopathy. Additional history provided: 3 falls in 24 hours, right shoulder pain, patient began developing intermittent lower extremity weakness approximately 2 weeks ago. EXAM: MRI HEAD WITHOUT CONTRAST TECHNIQUE: Multiplanar, multiecho pulse sequences of the brain and surrounding structures were obtained without intravenous contrast. COMPARISON:  Brain MRI 07/23/2019, head CT 07/23/2018 FINDINGS: Brain: The examination is intermittently motion degraded. Most notably, there is moderate motion degradation of the axial T2/FLAIR sequence. Mild scattered T2/FLAIR hyperintensity within the cerebral white matter is nonspecific, but consistent with chronic small vessel ischemic disease. 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 Mild generalized parenchymal atrophy. There is no acute infarct. No evidence of intracranial mass. No chronic intracranial blood products. No extra-axial fluid collection. No midline shift. Vascular: Expected proximal arterial flow voids. Skull and upper cervical spine: No focal marrow lesion. Sinuses/Orbits: Visualized orbits show no acute finding. Paranasal sinus mucosal thickening most notably within the inferior left frontal and bilateral ethmoid sinuses. Trace fluid within left mastoid air cells. IMPRESSION: 1. Mildly motion degraded examination. 2. No evidence of acute intracranial abnormality, including acute infarction. 3. Mild generalized parenchymal atrophy and chronic small vessel ischemic disease. This includes a small chronic lacunar infarct within the left cerebellum. 4. Paranasal sinus mucosal thickening. Electronically Signed   By: Kellie Simmering DO   On: 07/24/2019 16:17   MR BRAIN WO CONTRAST  Result Date: 07/23/2019 CLINICAL DATA:  Altered mental status EXAM: MRI HEAD WITHOUT CONTRAST TECHNIQUE: Multiplanar, multiecho pulse sequences of the brain and surrounding structures were obtained without intravenous contrast.  COMPARISON:  None. FINDINGS: Sagittal T1, axial DWI, and coronal DWI sequences were attempted and are nondiagnostic due to motion artifact. Patient could not tolerate remainder of study. IMPRESSION: Nondiagnostic study. Electronically Signed   By: Macy Mis M.D.   On: 07/23/2019 11:03   MR CERVICAL SPINE WO CONTRAST  Result Date: 07/24/2019 CLINICAL DATA:  Neuro deficit, acute, stroke suspected. Additional provided: 3 falls in the last 24 hours, right shoulder pain, patient developed some intermittent lower extremity weakness approximately 2 weeks ago. EXAM: MRI CERVICAL SPINE WITHOUT CONTRAST TECHNIQUE: Multiplanar, multisequence MR imaging of the cervical spine was performed.  No intravenous contrast was administered. COMPARISON:  Cervical spine MRI 03/13/2013. FINDINGS: The examination is mildly motion degraded. Alignment: Trace multilevel spondylolisthesis as follows. C3-C4 grade 1 retrolisthesis. C4-C5 and C5-C6 grade 1 anterolisthesis. C7-T1, T1-T2 and T2-T3 grade 1 anterolisthesis. Vertebrae: Vertebral body height is maintained. There is diffuse osseous marrow heterogeneity. No focal suspicious osseous lesion. Cord: No spinal cord signal abnormality is identified Posterior Fossa, vertebral arteries, paraspinal tissues: A chronic lacunar infarct within the left cerebellum was better appreciated on same-day brain MRI. Flow voids preserved within the imaged cervical vertebral arteries. Paraspinal soft tissues within normal limits. Disc levels: Unless otherwise stated, the level by level findings below have not significantly changed since prior MRI 03/13/2013. There has been interval progression of multilevel disc degeneration. Most notably, there is now moderate/advanced disc degeneration at C4-C5, C5-C6 and C6-C7. C2-C3: Minimal disc bulge. Mild left-sided facet hypertrophy. No significant spinal canal stenosis. Mild left neural foraminal narrowing. Right neural foramen patent. C3-C4: Progressive disc  bulge. Progressive left greater than right uncinate/facet hypertrophy. Mild spinal canal stenosis. Progressive bilateral neural foraminal narrowing (moderate right, moderate/severe left). C4-C5: A previously demonstrated left center disc protrusion has regressed. Disc bulge. Uncinate, facet and ligamentum flavum hypertrophy. Mild/moderate spinal canal stenosis, improved. Unchanged moderate bilateral neural foraminal narrowing (greater on the left). C5-C6: Progressive disc bulge with new superimposed tiny central disc protrusion. Progressive uncinate, facet and ligamentum flavum hypertrophy. Progressive moderate spinal canal stenosis now with contact upon the ventral spinal cord. Progressive moderate right neural foraminal narrowing. Left neural foramen patent. C6-C7: Disc bulge. Redemonstrated moderate-sized superimposed left center disc protrusion. Progressive uncinate/facet hypertrophy. Unchanged mild spinal canal stenosis with possible contact upon the ventral spinal cord. Unchanged mild left neural foraminal narrowing. C7-T1: Unchanged tiny central disc protrusion. Progressive facet hypertrophy. Minimal effacement of the ventral thecal sac without significant central canal stenosis. Neural foramina patent. IMPRESSION: At C4-C5, a previously demonstrated left center disc protrusion has regressed since MRI 03/13/2013. Improved multifactorial mild/moderate spinal canal narrowing. Persistent moderate bilateral neural foraminal narrowing. Cervical spondylosis has otherwise progressed or remain stable as compared to this prior examination. Findings are most notably as follows. At C5-C6, progressive multifactorial moderate spinal canal stenosis with possible contact upon the ventral spinal cord. Progressive moderate right neural foraminal narrowing. At C6-C7, and unchanged moderate-sized left center disc protrusion contributes to unchanged multifactorial mild spinal canal stenosis with possible contact upon the ventral  spinal cord. Unchanged mild left neural foraminal narrowing. No more than mild spinal canal stenosis at the remaining levels. Additional sites of neural foraminal narrowing as described and greatest bilaterally at C3-C4 (moderate right, moderate/severe left). Electronically Signed   By: Kellie Simmering DO   On: 07/24/2019 16:33   MR THORACIC SPINE WO CONTRAST  Result Date: 07/24/2019 CLINICAL DATA:  Neuro deficit, acute, stroke suspected. Additional history provided: 3 falls in the last 24 hours with right shoulder pain, patient began developing intermittent lower extremity weakness approximately 2 weeks ago. EXAM: MRI THORACIC SPINE WITHOUT CONTRAST TECHNIQUE: Multiplanar, multisequence MR imaging of the thoracic spine was performed. No intravenous contrast was administered. COMPARISON:  Thoracic spine MRI 02/20/2013 FINDINGS: Multiple sequences are motion degraded. Most notably, there is moderate/severe motion degradation of the sagittal STIR sequence Alignment: Mild T1-T2 and T2-T3 grade 1 anterolisthesis. No other significant spondylolisthesis. Vertebrae: Vertebral body height is maintained. There is nonspecific diffuse heterogeneity of the marrow signal. Moderate/severe motion degradation of the sagittal STIR sequence limits evaluation for marrow edema. There is a 10 mm STIR hyperintense  lesion within the posterolateral right T6 vertebral body which was present on prior MRI 02/20/2013. This lesion demonstrates precontrast T1 hyperintensity and likely reflects a hemangioma. Multilevel degenerative endplate irregularity with small Schmorl nodes. Cord: No spinal cord signal abnormality is identified within the limitations of motion degradation. Paraspinal and other soft tissues: No abnormality is identified within included portions of the thorax or upper abdomen. Paraspinal soft tissues within normal limits. Disc levels: Unless otherwise stated, the level by level findings below have not significantly changed  since prior MRI 02/20/2013. Mild-to-moderate multilevel disc degeneration throughout the thoracic spine appears progressed. There are multilevel disc bulges. Additionally, there is multilevel mild facet arthrosis, greatest within the lower thoracic spine. Level by level findings most notably as follows. At T1-T2, there is grade 1 anterolisthesis. Disc uncovering. Facet arthrosis. No significant spinal canal or foraminal narrowing. At T2-T3, there is grade 1 anterolisthesis. Small disc bulge. This mildly effaces the ventral thecal sac without significant central canal stenosis. At T3-T4, there is a small disc bulge eccentric to the right. Superimposed small right center disc protrusion. Mild effacement of the ventral thecal sac with possible contact upon the ventral spinal cord. At T6-T7, there is a shallow disc bulge. No significant spinal canal stenosis. At T7-T8, there is a shallow disc bulge. No significant spinal canal stenosis. At T8-T9, there is a shallow disc bulge. No significant spinal canal stenosis. At T9-T10, there is a shallow disc bulge. No significant spinal canal stenosis. No significant foraminal narrowing at any level. IMPRESSION: Motion degraded exam. There is nonspecific diffuse heterogeneity of marrow signal throughout the thoracic spine. No thoracic compression deformity. Moderate/severe motion degradation of the sagittal STIR sequence limits evaluation for marrow edema or focal osseous lesions. Progressive mild-to-moderate disc degeneration throughout the thoracic spine as compared to MRI 02/20/2013. Thoracic spondylosis is otherwise similar to this prior exam. No more than mild spinal canal stenosis at any level. At T3-T4, a small right center disc protrusion may contact the ventral spinal cord. No significant foraminal narrowing at any level. No spinal cord signal abnormality is identified within limitations of motion degradation. Electronically Signed   By: Kellie Simmering DO   On: 07/24/2019  16:54   DG Knee Complete 4 Views Right  Result Date: 07/23/2019 CLINICAL DATA:  RIGHT knee pain, fell 3 times yesterday EXAM: RIGHT KNEE - COMPLETE 4+ VIEW COMPARISON:  None FINDINGS: Osseous demineralization. Components of a RIGHT knee prosthesis are identified. Joint effusion present with a few small calcifications anteriorly at the suprapatellar region, some of which are extra-articular, additional of which may represent intra-articular loose bodies. Question nondisplaced fracture of the superior margin of the medial femoral condyle. No periprosthetic lucency. IMPRESSION: Osseous demineralization with RIGHT knee prosthesis. RIGHT knee joint effusion with question of a nondisplaced fracture of the medial femoral condyle. Electronically Signed   By: Lavonia Dana M.D.   On: 07/23/2019 08:35   Scheduled Meds: . Chlorhexidine Gluconate Cloth  6 each Topical Q0600  . dutasteride  0.5 mg Oral Daily  . enoxaparin (LOVENOX) injection  40 mg Subcutaneous Q24H  . fluticasone  2 spray Each Nare BID  . gabapentin  300 mg Oral QHS  . insulin aspart  0-5 Units Subcutaneous QHS  . insulin aspart  0-9 Units Subcutaneous TID WC  . methocarbamol  1,000 mg Oral QHS  . midodrine  10 mg Oral TID WC  . pravastatin  40 mg Oral QHS  . sodium chloride flush  3 mL Intravenous Q12H  .  tamsulosin  0.4 mg Oral Daily   Continuous Infusions: . sodium chloride    . cefTRIAXone (ROCEPHIN)  IV 1 g (07/24/19 0943)     LOS: 1 day    Critical Care Procedure Note Authorized and Performed by: Murvin Natal MD  Total Critical Care time:  35 mins Due to a high probability of clinically significant, life threatening deterioration, the patient required my highest level of preparedness to intervene emergently and I personally spent this critical care time directly and personally managing the patient.  This critical care time included obtaining a history; examining the patient, pulse oximetry; ordering and review of studies;  arranging urgent treatment with development of a management plan; evaluation of patient's response of treatment; frequent reassessment; and discussions with other providers.  This critical care time was performed to assess and manage the high probability of imminent and life threatening deterioration that could result in multi-organ failure.  It was exclusive of separately billable procedures and treating other patients and teaching time.    Irwin Brakeman, MD [facility]   To contact the attending provider between 7A-7P or the covering provider during after hours 7P-7A, please log into the web site [email or web address] and access using universal  password for that web site. If you do not have the password, please call the hospital operator.  07/24/2019, 4:57 PM

## 2019-07-25 DIAGNOSIS — G4733 Obstructive sleep apnea (adult) (pediatric): Secondary | ICD-10-CM

## 2019-07-25 DIAGNOSIS — N400 Enlarged prostate without lower urinary tract symptoms: Secondary | ICD-10-CM

## 2019-07-25 DIAGNOSIS — J849 Interstitial pulmonary disease, unspecified: Secondary | ICD-10-CM

## 2019-07-25 DIAGNOSIS — I951 Orthostatic hypotension: Secondary | ICD-10-CM

## 2019-07-25 DIAGNOSIS — R262 Difficulty in walking, not elsewhere classified: Secondary | ICD-10-CM

## 2019-07-25 DIAGNOSIS — E119 Type 2 diabetes mellitus without complications: Secondary | ICD-10-CM

## 2019-07-25 DIAGNOSIS — M5020 Other cervical disc displacement, unspecified cervical region: Secondary | ICD-10-CM

## 2019-07-25 DIAGNOSIS — Z9989 Dependence on other enabling machines and devices: Secondary | ICD-10-CM

## 2019-07-25 LAB — CBC WITH DIFFERENTIAL/PLATELET
Abs Immature Granulocytes: 0.01 10*3/uL (ref 0.00–0.07)
Basophils Absolute: 0 10*3/uL (ref 0.0–0.1)
Basophils Relative: 1 %
Eosinophils Absolute: 0.2 10*3/uL (ref 0.0–0.5)
Eosinophils Relative: 4 %
HCT: 30.6 % — ABNORMAL LOW (ref 39.0–52.0)
Hemoglobin: 9.8 g/dL — ABNORMAL LOW (ref 13.0–17.0)
Immature Granulocytes: 0 %
Lymphocytes Relative: 17 %
Lymphs Abs: 0.8 10*3/uL (ref 0.7–4.0)
MCH: 31.4 pg (ref 26.0–34.0)
MCHC: 32 g/dL (ref 30.0–36.0)
MCV: 98.1 fL (ref 80.0–100.0)
Monocytes Absolute: 0.6 10*3/uL (ref 0.1–1.0)
Monocytes Relative: 14 %
Neutro Abs: 2.8 10*3/uL (ref 1.7–7.7)
Neutrophils Relative %: 64 %
Platelets: 128 10*3/uL — ABNORMAL LOW (ref 150–400)
RBC: 3.12 MIL/uL — ABNORMAL LOW (ref 4.22–5.81)
RDW: 13.1 % (ref 11.5–15.5)
WBC: 4.4 10*3/uL (ref 4.0–10.5)
nRBC: 0 % (ref 0.0–0.2)

## 2019-07-25 LAB — COMPREHENSIVE METABOLIC PANEL
ALT: 100 U/L — ABNORMAL HIGH (ref 0–44)
AST: 120 U/L — ABNORMAL HIGH (ref 15–41)
Albumin: 2.9 g/dL — ABNORMAL LOW (ref 3.5–5.0)
Alkaline Phosphatase: 46 U/L (ref 38–126)
Anion gap: 6 (ref 5–15)
BUN: 16 mg/dL (ref 8–23)
CO2: 28 mmol/L (ref 22–32)
Calcium: 8.5 mg/dL — ABNORMAL LOW (ref 8.9–10.3)
Chloride: 105 mmol/L (ref 98–111)
Creatinine, Ser: 0.75 mg/dL (ref 0.61–1.24)
GFR calc Af Amer: >60 mL/min (ref >60)
GFR calc non Af Amer: >60 mL/min (ref >60)
Glucose, Bld: 130 mg/dL — ABNORMAL HIGH (ref 70–99)
Potassium: 4.5 mmol/L (ref 3.5–5.1)
Sodium: 139 mmol/L (ref 135–145)
Total Bilirubin: 0.6 mg/dL (ref 0.3–1.2)
Total Protein: 5.3 g/dL — ABNORMAL LOW (ref 6.5–8.1)

## 2019-07-25 LAB — MAGNESIUM: Magnesium: 1.9 mg/dL (ref 1.7–2.4)

## 2019-07-25 LAB — GLUCOSE, CAPILLARY
Glucose-Capillary: 123 mg/dL — ABNORMAL HIGH (ref 70–99)
Glucose-Capillary: 169 mg/dL — ABNORMAL HIGH (ref 70–99)

## 2019-07-25 MED ORDER — CEFDINIR 300 MG PO CAPS
300.0000 mg | ORAL_CAPSULE | Freq: Every day | ORAL | 0 refills | Status: AC
Start: 2019-07-25 — End: 2019-07-28

## 2019-07-25 NOTE — Discharge Instructions (Signed)
IMPORTANT INFORMATION: PAY CLOSE ATTENTION   PHYSICIAN DISCHARGE INSTRUCTIONS  Follow with Primary care provider  Lajean Manes, MD  and other consultants as instructed by your Hospitalist Physician  [place] IF SYMPTOMS COME BACK, WORSEN OR NEW PROBLEM DEVELOPS   Please note: You were cared for by a hospitalist during your hospital stay. Every effort will be made to forward records to your primary care provider.  You can request that your primary care provider send for your hospital records if they have not received them.  Once you are discharged, your primary care physician will handle any further medical issues. Please note that NO REFILLS for any discharge medications will be authorized once you are discharged, as it is imperative that you return to your primary care physician (or establish a relationship with a primary care physician if you do not have one) for your post hospital discharge needs so that they can reassess your need for medications and monitor your lab values.  Please get a complete blood count and chemistry panel checked by your Primary MD at your next visit, and again as instructed by your Primary MD.  Get Medicines reviewed and adjusted: Please take all your medications with you for your next visit with your Primary MD  Laboratory/radiological data: Please request your Primary MD to go over all hospital tests and procedure/radiological results at the follow up, please ask your primary care provider to get all Hospital records sent to his/her office.  In some cases, they will be blood work, cultures and biopsy results pending at the time of your discharge. Please request that your primary care provider follow up on these results.  If you are diabetic, please bring your blood sugar readings with you to your follow up appointment with primary care.    Please call and make your follow up appointments as soon as possible.    Also Note  the following: If you experience worsening of your admission symptoms, develop shortness of breath, life threatening emergency, suicidal or homicidal thoughts you must seek medical attention immediately by calling 911 or calling your MD immediately  if symptoms less severe.  You must read complete instructions/literature along with all the possible adverse reactions/side effects for all the Medicines you take and that have been prescribed to you. Take any new Medicines after you have completely understood and accpet all the possible adverse reactions/side effects.   Do not drive when taking Pain medications or sleeping medications (Benzodiazepines)  Do not take more than prescribed Pain, Sleep and Anxiety Medications. It is not advisable to combine anxiety,sleep and pain medications without talking with your primary care practitioner  Special Instructions: If you have smoked or chewed Tobacco  in the last 2 yrs please stop smoking, stop any regular Alcohol  and or any Recreational drug use.  Wear Seat belts while driving.  Do not drive if taking any narcotic, mind altering or controlled substances or recreational drugs or alcohol.

## 2019-07-25 NOTE — Discharge Summary (Addendum)
Physician Discharge Summary  [name]. ZE:[id number] DOB: 1945/09/12 DOA: 07/23/2019  PCP: Lajean Manes, MD Ortho/Spine:  Dr. Patrice Paradise  Admit date: 07/23/2019 Discharge date: 07/25/2019  Admitted From:  Home  Disposition:  Home with [facility]   Recommendations for Outpatient Follow-up:  1. Follow up with Dr. Patrice Paradise in 1 week to review MRI results 2. Follow up with Dr. Veverly Fells as scheduled for preop exam 3. Follow up with PCP in 1-2 weeks 4. Please follow up final urine culture results.  5. Please recheck LFTs in 1-2 weeks.   Home Health: PT   Discharge Condition: STABLE  CODE STATUS: FULL    Brief Hospitalization Summary: Please see all hospital notes, images, labs for full details of the hospitalization. ADMISSION HPI: [name]. is a 74 y.o. male with medical history significant for anxiety/depression, BPH, type 2 diabetes, and lumbar fusion with multiple joint replacements, who presented to the ED with over 3 falls in the last 24 hours.  He states that he began developing some intermittent lower extremity weakness approximately 2 weeks ago when he was walking through a hardware store and his legs suddenly froze up.  He is noted significant trouble with stiffness of his lower extremities as well as weakness and has had some urinary incontinence as well.  His wife at the bedside states that he is also been quite confused and has been having issues with his memory in the last 1 week.  He denies any headaches, dizziness, syncopal episodes, fevers, or chills.  He has had quite an ataxic gait and has bumped his knee at home which has caused significant pain in his knee.  He states that his back pain has worsened as well.   ED Course: Vital signs are stable, however patient has had a temperature of 103.2.  Blood cultures have been collected and he has been started on Rocephin with suspicion of UTI in the setting of urinary incontinence and some leukocytes noted in his urine.  His brain  MRI initially had shown too much motion artifact and head CT does not demonstrate any acute findings.  Other imaging demonstrates right knee nondisplaced distal femoral medial condyle fracture with effusion for which she has had an immobilizer placed.  His Covid testing is negative. Brief Narrative:  74 y.o.malewith medical history significant foranxiety/depression, BPH, type 2 diabetes, and lumbar fusion with multiple joint replacements, who presented to the ED with over 3 falls in the last 24 hours. He states that he began developing some intermittent lower extremity weakness approximately 2 weeks ago when he was walking through a hardware store and his legs suddenly froze up.   Assessment & Plan:   Active Problems:   OSA on CPAP   BPH (benign prostatic hyperplasia)   Orthostatic hypotension   Type 2 diabetes mellitus treated without insulin (HCC)   ILD (interstitial lung disease) (HCC)   Iron deficiency anemia   Falls   Ambulatory dysfunction   Closed nondisplaced fracture of condyle of right femur (HCC)   Bulge of cervical disc without myelopathy   1. Acute encephalopathy -RESOLVED.  He has much improved symptoms today and seems to be back to his baseline mentation.  MRI brain no acute findings.  2. Enterobacter UTI-treated with ceftriaxone.  Afebrile now.  DC on omnicef x 3 more days.  3. Right femoral condyle fracture nondisplaced secondary to fall-he remains in a immobilizer.  Outpatient orthopedic follow-up. 4. Normocytic anemia-stable. 5. Chronic cervical disc disease-chronic findings on MRI  with slightly worse findings at C5-6.  Discussed with patient.  He prefers to review with Dr. Patrice Paradise who he is in close contact with.   6. Diabetes mellitus-holding home oral medications treating with SSI coverage and carb modified diet. 7. Diabetic peripheral neuropathy-stable on home gabapentin. 8. Anxiety depression-bupropion temporarily on hold per recommendations of neurologist.   Klonopin for anxiety as needed. 9. BPH-resume Flomax Avodart and finish steroid. 10. Lower extremity weakness-this is improving as well.  MRI of the lumbar/thoracic spine with chronic findings of spondylosis.  Difficult to assess strength in the right lower extremity due to the immobilizer that is in place but no gross deficits or abnormalities.  11. Chronic Hypotension - asymptomatic, He responded well to fluid boluses and has been resumed on his home midodrine with good results.  BPs have been stable.  12. Elevated LFTs - recommend rechecking with PCP in 1-2 weeks.    Discharge Diagnoses:  Active Problems:   OSA on CPAP   BPH (benign prostatic hyperplasia)   Orthostatic hypotension   Type 2 diabetes mellitus treated without insulin (HCC)   ILD (interstitial lung disease) (HCC)   Iron deficiency anemia   Falls   Ambulatory dysfunction   Closed nondisplaced fracture of condyle of right femur (HCC)   Bulge of cervical disc without myelopathy   Discharge Instructions:  Allergies as of 07/25/2019      Reactions   Breo Ellipta [fluticasone Furoate-vilanterol] Other (See Comments)   Thrush   Demerol [meperidine] Itching   Lamotrigine Hives, Itching   Nsaids Nausea Only   Gastric bypass      Medication List    STOP taking these medications   buPROPion 150 MG 24 hr tablet Commonly known as: WELLBUTRIN XL   pioglitazone 45 MG tablet Commonly known as: ACTOS     TAKE these medications   acetaminophen 500 MG tablet Commonly known as: TYLENOL Take 1,000 mg by mouth daily as needed for headache.   albuterol 108 (90 Base) MCG/ACT inhaler Commonly known as: VENTOLIN HFA Inhale 2 puffs into the lungs every 6 (six) hours as needed for wheezing or shortness of breath.   aspirin EC 81 MG tablet Take 81 mg by mouth daily as needed for mild pain.   cefdinir 300 MG capsule Commonly known as: OMNICEF Take 1 capsule (300 mg total) by mouth daily for 3 days.   CENTRUM SILVER ADULT  50+ PO Take 1 tablet by mouth daily.   clonazePAM 0.5 MG tablet Commonly known as: KLONOPIN Take 0.5 mg by mouth daily.   dutasteride 0.5 MG capsule Commonly known as: AVODART Take 0.5 mg by mouth daily.   finasteride 5 MG tablet Commonly known as: PROSCAR Take 1 tablet by mouth daily.   fluticasone 50 MCG/ACT nasal spray Commonly known as: FLONASE Place 2 sprays into the nose 2 (two) times daily.   gabapentin 300 MG capsule Commonly known as: NEURONTIN Take 1 capsule by mouth at bedtime. Pt takes 1 capsule in morning and 1 capsule by mouth at bedtime.   HYDROcodone-acetaminophen 10-325 MG tablet Commonly known as: NORCO Take 1-2 tablets by mouth every 6 (six) hours as needed for moderate pain. What changed:   how much to take  when to take this   metFORMIN 500 MG tablet Commonly known as: GLUCOPHAGE Take 500 mg by mouth 2 (two) times daily with a meal.   methocarbamol 500 MG tablet Commonly known as: ROBAXIN Take 1,000 mg by mouth at bedtime.   midodrine 10  MG tablet Commonly known as: PROAMATINE Take 10 mg by mouth 3 (three) times daily.   pravastatin 40 MG tablet Commonly known as: PRAVACHOL Take 40 mg by mouth daily.   tamsulosin 0.4 MG Caps capsule Commonly known as: FLOMAX Take 0.4 mg by mouth daily.   UNABLE TO FIND Med Name: CPAP 10 cm      Follow-up Information    Stoneking, Hal, MD. Schedule an appointment as soon as possible for a visit in 5 week(s).   Specialty: Internal Medicine Contact information: [address] [place] [address] [phone]        Starling Manns, MD. Schedule an appointment as soon as possible for a visit in 1 week(s).   Specialty: Orthopedic Surgery Contact information: [address] [place] [address] [phone]        Phillips Odor, MD. Schedule an appointment as soon as possible for a visit in 2 week(s).   Specialty: Neurology Why: Hospital Follow up  Contact  information: [address] [place] [address] [phone]        Wilford Corner, MD. Schedule an appointment as soon as possible for a visit in 2 week(s).   Specialty: Gastroenterology Why: Hospital Follow Up, Elevated liver enzymes Contact information: [address] [place] [address] [phone]          Allergies  Allergen Reactions  . Breo Ellipta [Fluticasone Furoate-Vilanterol] Other (See Comments)    Thrush  . Demerol [Meperidine] Itching  . Lamotrigine Hives and Itching  . Nsaids Nausea Only    Gastric bypass   Allergies as of 07/25/2019      Reactions   Breo Ellipta [fluticasone Furoate-vilanterol] Other (See Comments)   Thrush   Demerol [meperidine] Itching   Lamotrigine Hives, Itching   Nsaids Nausea Only   Gastric bypass      Medication List    STOP taking these medications   buPROPion 150 MG 24 hr tablet Commonly known as: WELLBUTRIN XL   pioglitazone 45 MG tablet Commonly known as: ACTOS     TAKE these medications   acetaminophen 500 MG tablet Commonly known as: TYLENOL Take 1,000 mg by mouth daily as needed for headache.   albuterol 108 (90 Base) MCG/ACT inhaler Commonly known as: VENTOLIN HFA Inhale 2 puffs into the lungs every 6 (six) hours as needed for wheezing or shortness of breath.   aspirin EC 81 MG tablet Take 81 mg by mouth daily as needed for mild pain.   cefdinir 300 MG capsule Commonly known as: OMNICEF Take 1 capsule (300 mg total) by mouth daily for 3 days.   CENTRUM SILVER ADULT 50+ PO Take 1 tablet by mouth daily.   clonazePAM 0.5 MG tablet Commonly known as: KLONOPIN Take 0.5 mg by mouth daily.   dutasteride 0.5 MG capsule Commonly known as: AVODART Take 0.5 mg by mouth daily.   finasteride 5 MG tablet Commonly known as: PROSCAR Take 1 tablet by mouth daily.   fluticasone 50 MCG/ACT nasal spray Commonly known as: FLONASE Place 2 sprays into the nose 2 (two) times daily.    gabapentin 300 MG capsule Commonly known as: NEURONTIN Take 1 capsule by mouth at bedtime. Pt takes 1 capsule in morning and 1 capsule by mouth at bedtime.   HYDROcodone-acetaminophen 10-325 MG tablet Commonly known as: NORCO Take 1-2 tablets by mouth every 6 (six) hours as needed for moderate pain. What changed:   how much to take  when to take this   metFORMIN 500 MG tablet Commonly known as: GLUCOPHAGE Take 500 mg by mouth 2 (two) times daily with a meal.   methocarbamol 500 MG tablet Commonly known as: ROBAXIN Take 1,000 mg by mouth at bedtime.   midodrine 10 MG tablet Commonly known as: PROAMATINE Take 10 mg by mouth 3 (three) times daily.   pravastatin 40 MG tablet Commonly known as: PRAVACHOL Take 40 mg by mouth daily.   tamsulosin 0.4 MG Caps capsule Commonly known as: FLOMAX Take 0.4 mg by mouth daily.   UNABLE TO FIND Med Name: CPAP 10 cm       Procedures/Studies: DG Chest 1 View  Result Date: 07/23/2019 CLINICAL DATA:  Weakness, lower back pain, fell 3 times yesterday, diabetes mellitus, asthma, former smoker EXAM: CHEST  1 VIEW COMPARISON:  08/22/2018 FINDINGS: Upper normal heart size with slight vascular congestion. Mediastinal contours normal. Atherosclerotic calcification aorta. Chronic interstitial changes at the mid to lower lungs greatest at LEFT base. No acute infiltrate, pleural effusion or pneumothorax. Bones demineralized. IMPRESSION: Chronic interstitial changes. No acute abnormalities. Electronically Signed   By: Lavonia Dana M.D.   On: 07/23/2019 08:36   DG Lumbar Spine Complete  Result Date: 07/23/2019 CLINICAL DATA:  Weakness, lower back pain, fell 3 times yesterday, history BILATERAL hip replacements, lumbar fusion, diabetes mellitus, smoker EXAM: LUMBAR SPINE - COMPLETE 4+ VIEW COMPARISON:  01/27/2014 FINDINGS: Osseous demineralization. Five lumbar vertebra. Disc space narrowing and facet degenerative changes L4-L5. Grade 1 anterolisthesis  L4-L5 new since prior exam. Chronic anterolisthesis L5-S1 post posterior lumbar fusion. Vertebral body heights maintained. No fracture, bone destruction or definite spondylolysis. SI joints preserved. BILATERAL hip prostheses. Increased stool in colon in pelvis. IMPRESSION: Prior L5-S1 fusion. Increased disc space narrowing and facet degenerative changes L4-L5 with new grade 1 anterolisthesis. Electronically Signed   By: Lavonia Dana M.D.   On: 07/23/2019 08:32   CT HEAD WO CONTRAST  Result Date: 07/23/2019 CLINICAL DATA:  Fall. EXAM: CT HEAD WITHOUT CONTRAST TECHNIQUE: Contiguous axial images were obtained from the base of the skull through the vertex without intravenous contrast. COMPARISON:  CT head dated March 04, 2013. FINDINGS: Brain: No evidence of acute infarction, hemorrhage, hydrocephalus, extra-axial collection or mass lesion/mass effect. Progressive mild generalized cerebral atrophy. Vascular: No hyperdense vessel or unexpected calcification. Skull: Normal. Negative for fracture or focal lesion. Sinuses/Orbits: Chronic partial opacification of the left frontal sinus and bilateral ethmoid air cells. Remaining paranasal sinuses and mastoid air cells are clear. The orbits are unremarkable. Other: None. IMPRESSION: 1. No acute intracranial abnormality. Progressive mild generalized cerebral atrophy. 2. Chronic sinusitis. Electronically Signed   By: Titus Dubin M.D.   On: 07/23/2019 08:07   MR BRAIN WO CONTRAST  Result Date: 07/24/2019 CLINICAL DATA:  Encephalopathy. Additional history provided: 3 falls in 24 hours, right shoulder pain, patient began developing intermittent lower extremity weakness approximately 2 weeks ago. EXAM: MRI HEAD WITHOUT CONTRAST TECHNIQUE: Multiplanar, multiecho pulse sequences of the brain and surrounding structures were obtained without intravenous contrast. COMPARISON:  Brain MRI 07/23/2019, head CT 07/23/2018 FINDINGS: Brain: The examination is intermittently  motion degraded. Most notably, there is moderate motion degradation of the axial T2/FLAIR sequence. Mild scattered T2/FLAIR hyperintensity within the cerebral white matter is nonspecific, but consistent with chronic small vessel ischemic disease. 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 Mild generalized parenchymal atrophy. There is no acute infarct. No evidence of intracranial mass. No chronic intracranial blood products. No extra-axial fluid collection.  No midline shift. Vascular: Expected proximal arterial flow voids. Skull and upper cervical spine: No focal marrow lesion. Sinuses/Orbits: Visualized orbits show no acute finding. Paranasal sinus mucosal thickening most notably within the inferior left frontal and bilateral ethmoid sinuses. Trace fluid within left mastoid air cells. IMPRESSION: 1. Mildly motion degraded examination. 2. No evidence of acute intracranial abnormality, including acute infarction. 3. Mild generalized parenchymal atrophy and chronic small vessel ischemic disease. This includes a small chronic lacunar infarct within the left cerebellum. 4. Paranasal sinus mucosal thickening. Electronically Signed   By: Kellie Simmering DO   On: 07/24/2019 16:17   MR BRAIN WO CONTRAST  Result Date: 07/23/2019 CLINICAL DATA:  Altered mental status EXAM: MRI HEAD WITHOUT CONTRAST TECHNIQUE: Multiplanar, multiecho pulse sequences of the brain and surrounding structures were obtained without intravenous contrast. COMPARISON:  None. FINDINGS: Sagittal T1, axial DWI, and coronal DWI sequences were attempted and are nondiagnostic due to motion artifact. Patient could not tolerate remainder of study. IMPRESSION: Nondiagnostic study. Electronically Signed   By: Macy Mis M.D.   On: 07/23/2019 11:03   MR CERVICAL SPINE WO CONTRAST  Result Date: 07/24/2019 CLINICAL DATA:  Neuro deficit, acute, stroke suspected. Additional provided: 3 falls in the last 24 hours, right shoulder pain, patient  developed some intermittent lower extremity weakness approximately 2 weeks ago. EXAM: MRI CERVICAL SPINE WITHOUT CONTRAST TECHNIQUE: Multiplanar, multisequence MR imaging of the cervical spine was performed. No intravenous contrast was administered. COMPARISON:  Cervical spine MRI 03/13/2013. FINDINGS: The examination is mildly motion degraded. Alignment: Trace multilevel spondylolisthesis as follows. C3-C4 grade 1 retrolisthesis. C4-C5 and C5-C6 grade 1 anterolisthesis. C7-T1, T1-T2 and T2-T3 grade 1 anterolisthesis. Vertebrae: Vertebral body height is maintained. There is diffuse osseous marrow heterogeneity. No focal suspicious osseous lesion. Cord: No spinal cord signal abnormality is identified Posterior Fossa, vertebral arteries, paraspinal tissues: A chronic lacunar infarct within the left cerebellum was better appreciated on same-day brain MRI. Flow voids preserved within the imaged cervical vertebral arteries. Paraspinal soft tissues within normal limits. Disc levels: Unless otherwise stated, the level by level findings below have not significantly changed since prior MRI 03/13/2013. There has been interval progression of multilevel disc degeneration. Most notably, there is now moderate/advanced disc degeneration at C4-C5, C5-C6 and C6-C7. C2-C3: Minimal disc bulge. Mild left-sided facet hypertrophy. No significant spinal canal stenosis. Mild left neural foraminal narrowing. Right neural foramen patent. C3-C4: Progressive disc bulge. Progressive left greater than right uncinate/facet hypertrophy. Mild spinal canal stenosis. Progressive bilateral neural foraminal narrowing (moderate right, moderate/severe left). C4-C5: A previously demonstrated left center disc protrusion has regressed. Disc bulge. Uncinate, facet and ligamentum flavum hypertrophy. Mild/moderate spinal canal stenosis, improved. Unchanged moderate bilateral neural foraminal narrowing (greater on the left). C5-C6: Progressive disc bulge with  new superimposed tiny central disc protrusion. Progressive uncinate, facet and ligamentum flavum hypertrophy. Progressive moderate spinal canal stenosis now with contact upon the ventral spinal cord. Progressive moderate right neural foraminal narrowing. Left neural foramen patent. C6-C7: Disc bulge. Redemonstrated moderate-sized superimposed left center disc protrusion. Progressive uncinate/facet hypertrophy. Unchanged mild spinal canal stenosis with possible contact upon the ventral spinal cord. Unchanged mild left neural foraminal narrowing. C7-T1: Unchanged tiny central disc protrusion. Progressive facet hypertrophy. Minimal effacement of the ventral thecal sac without significant central canal stenosis. Neural foramina patent. IMPRESSION: At C4-C5, a previously demonstrated left center disc protrusion has regressed since MRI 03/13/2013. Improved multifactorial mild/moderate spinal canal narrowing. Persistent moderate bilateral neural foraminal narrowing. Cervical spondylosis has otherwise progressed or remain  stable as compared to this prior examination. Findings are most notably as follows. At C5-C6, progressive multifactorial moderate spinal canal stenosis with possible contact upon the ventral spinal cord. Progressive moderate right neural foraminal narrowing. At C6-C7, and unchanged moderate-sized left center disc protrusion contributes to unchanged multifactorial mild spinal canal stenosis with possible contact upon the ventral spinal cord. Unchanged mild left neural foraminal narrowing. No more than mild spinal canal stenosis at the remaining levels. Additional sites of neural foraminal narrowing as described and greatest bilaterally at C3-C4 (moderate right, moderate/severe left). Electronically Signed   By: Kellie Simmering DO   On: 07/24/2019 16:33   MR THORACIC SPINE WO CONTRAST  Result Date: 07/24/2019 CLINICAL DATA:  Neuro deficit, acute, stroke suspected. Additional history provided: 3 falls in the  last 24 hours with right shoulder pain, patient began developing intermittent lower extremity weakness approximately 2 weeks ago. EXAM: MRI THORACIC SPINE WITHOUT CONTRAST TECHNIQUE: Multiplanar, multisequence MR imaging of the thoracic spine was performed. No intravenous contrast was administered. COMPARISON:  Thoracic spine MRI 02/20/2013 FINDINGS: Multiple sequences are motion degraded. Most notably, there is moderate/severe motion degradation of the sagittal STIR sequence Alignment: Mild T1-T2 and T2-T3 grade 1 anterolisthesis. No other significant spondylolisthesis. Vertebrae: Vertebral body height is maintained. There is nonspecific diffuse heterogeneity of the marrow signal. Moderate/severe motion degradation of the sagittal STIR sequence limits evaluation for marrow edema. There is a 10 mm STIR hyperintense lesion within the posterolateral right T6 vertebral body which was present on prior MRI 02/20/2013. This lesion demonstrates precontrast T1 hyperintensity and likely reflects a hemangioma. Multilevel degenerative endplate irregularity with small Schmorl nodes. Cord: No spinal cord signal abnormality is identified within the limitations of motion degradation. Paraspinal and other soft tissues: No abnormality is identified within included portions of the thorax or upper abdomen. Paraspinal soft tissues within normal limits. Disc levels: Unless otherwise stated, the level by level findings below have not significantly changed since prior MRI 02/20/2013. Mild-to-moderate multilevel disc degeneration throughout the thoracic spine appears progressed. There are multilevel disc bulges. Additionally, there is multilevel mild facet arthrosis, greatest within the lower thoracic spine. Level by level findings most notably as follows. At T1-T2, there is grade 1 anterolisthesis. Disc uncovering. Facet arthrosis. No significant spinal canal or foraminal narrowing. At T2-T3, there is grade 1 anterolisthesis. Small disc  bulge. This mildly effaces the ventral thecal sac without significant central canal stenosis. At T3-T4, there is a small disc bulge eccentric to the right. Superimposed small right center disc protrusion. Mild effacement of the ventral thecal sac with possible contact upon the ventral spinal cord. At T6-T7, there is a shallow disc bulge. No significant spinal canal stenosis. At T7-T8, there is a shallow disc bulge. No significant spinal canal stenosis. At T8-T9, there is a shallow disc bulge. No significant spinal canal stenosis. At T9-T10, there is a shallow disc bulge. No significant spinal canal stenosis. No significant foraminal narrowing at any level. IMPRESSION: Motion degraded exam. There is nonspecific diffuse heterogeneity of marrow signal throughout the thoracic spine. No thoracic compression deformity. Moderate/severe motion degradation of the sagittal STIR sequence limits evaluation for marrow edema or focal osseous lesions. Progressive mild-to-moderate disc degeneration throughout the thoracic spine as compared to MRI 02/20/2013. Thoracic spondylosis is otherwise similar to this prior exam. No more than mild spinal canal stenosis at any level. At T3-T4, a small right center disc protrusion may contact the ventral spinal cord. No significant foraminal narrowing at any level. No spinal cord signal  abnormality is identified within limitations of motion degradation. Electronically Signed   By: Kellie Simmering DO   On: 07/24/2019 16:54   MR LUMBAR SPINE WO CONTRAST  Result Date: 07/24/2019 CLINICAL DATA:  Multiple recent falls. Lower extremity weakness. EXAM: MRI LUMBAR SPINE WITHOUT CONTRAST TECHNIQUE: Multiplanar, multisequence MR imaging of the lumbar spine was performed. No intravenous contrast was administered. COMPARISON:  Lumbar spine MRI 06/11/2019 FINDINGS: Segmentation:  Standard Alignment:  Grade 1 anterolisthesis at L5-S1, unchanged Vertebrae: Diffusely heterogeneous bone marrow signal is  unchanged since prior examination. No fracture. L5-S1 posterior instrument fusion. Conus medullaris and cauda equina: Conus extends to the L1 level. Conus and cauda equina appear normal. Paraspinal and other soft tissues: Negative Disc levels: T10-L1 levels are imaged only in the sagittal plane but are normal. L1-2: Disc desiccation without herniation. No stenosis. L2-3: Mild facet hypertrophy with small disc bulge. No spinal canal or neural foraminal stenosis. L3-4: Disc desiccation and small central disc protrusion. Mild facet hypertrophy. No spinal canal or neural foraminal stenosis. L4-5: Unchanged small central disc protrusion with narrowing of both lateral recesses. No central spinal canal stenosis. Unchanged severe bilateral neural foraminal stenosis. L5-S1: Grade 1 anterolisthesis. Posterior fusion with decompression. Thecal sac is widely patent. Unchanged severe bilateral neural foraminal stenosis. IMPRESSION: 1. L5-S1 posterior instrumented fusion and decompression with unchanged severe bilateral neural foraminal stenosis. 2. L4-L5 central disc protrusion with narrowing of both lateral recesses and unchanged severe bilateral neural foraminal stenosis. 3. Unchanged diffusely heterogeneous bone marrow signal. Electronically Signed   By: Ulyses Jarred M.D.   On: 07/24/2019 19:32   DG Knee Complete 4 Views Right  Result Date: 07/23/2019 CLINICAL DATA:  RIGHT knee pain, fell 3 times yesterday EXAM: RIGHT KNEE - COMPLETE 4+ VIEW COMPARISON:  None FINDINGS: Osseous demineralization. Components of a RIGHT knee prosthesis are identified. Joint effusion present with a few small calcifications anteriorly at the suprapatellar region, some of which are extra-articular, additional of which may represent intra-articular loose bodies. Question nondisplaced fracture of the superior margin of the medial femoral condyle. No periprosthetic lucency. IMPRESSION: Osseous demineralization with RIGHT knee prosthesis. RIGHT  knee joint effusion with question of a nondisplaced fracture of the medial femoral condyle. Electronically Signed   By: Lavonia Dana M.D.   On: 07/23/2019 08:35     Subjective: Patient says his weakness is much better and resolved now on the lower extremities.  He would like to go home today.  Also of bowel or bladder control.  He has been working with physical therapy and agreeable to home health PT.  He is sitting up in a chair today.  Discharge Exam: Vitals:   07/25/19 0735 07/25/19 0740  BP: 119/68   Pulse:  90  Resp:  14  Temp: 98.3 F (36.8 C)   SpO2:     Vitals:   07/25/19 0000 07/25/19 0500 07/25/19 0735 07/25/19 0740  BP: (!) 107/58  119/68   Pulse:    90  Resp: 13   14  Temp:  97.9 F (36.6 C) 98.3 F (36.8 C)   TempSrc:  Oral Oral   SpO2:      Weight:      Height:        General: Pt is alert, awake, not in acute distress Cardiovascular: RRR, S1/S2 +, no rubs, no gallops Respiratory: CTA bilaterally, no wheezing, no rhonchi Abdominal: Soft, NT, ND, bowel sounds + Extremities: RLE in immobilizer, LLE good ROM and strength 5/5 Neuro: nonfocal exam.  The results of significant diagnostics from this hospitalization (including imaging, microbiology, ancillary and laboratory) are listed below for reference.     Microbiology: Recent Results (from the past 240 hour(s))  Urine culture     Status: Abnormal (Preliminary result)   Collection Time: 07/23/19  6:29 AM   Specimen: Urine, Random  Result Value Ref Range Status   Specimen Description   Final    URINE, RANDOM Performed at [facility], [address], [place], [place] [address]    Special Requests   Final    NONE Performed at [facility], [address], [place], [place] [address]    Culture (A)  Final    >=100,000 COLONIES/mL ENTEROBACTER SPECIES CULTURE REINCUBATED FOR BETTER GROWTH Performed at [facility], [place] [address], [place], [place] [address]    Report Status PENDING  Incomplete   Culture, blood (routine x 2)     Status: None (Preliminary result)   Collection Time: 07/23/19  9:36 AM   Specimen: BLOOD  Result Value Ref Range Status   Specimen Description BLOOD LEFT ANTECUBITAL  Final   Special Requests   Final    BOTTLES DRAWN AEROBIC AND ANAEROBIC Blood Culture results may not be optimal due to an inadequate volume of blood received in culture bottles   Culture   Final    NO GROWTH < 24 HOURS Performed at [facility], [address] [place], [place] [address]    Report Status PENDING  Incomplete  Culture, blood (routine x 2)     Status: None (Preliminary result)   Collection Time: 07/23/19  9:43 AM   Specimen: BLOOD RIGHT HAND  Result Value Ref Range Status   Specimen Description BLOOD RIGHT HAND  Final   Special Requests   Final    BOTTLES DRAWN AEROBIC AND ANAEROBIC Blood Culture results may not be optimal due to an inadequate volume of blood received in culture bottles   Culture   Final    NO GROWTH < 24 HOURS Performed at [facility], [address] [place], [place] [address]    Report Status PENDING  Incomplete  SARS Coronavirus 2 by RT PCR (hospital order, performed in [place] hospital lab) Nasopharyngeal Nasopharyngeal Swab     Status: None   Collection Time: 07/23/19 10:32 AM   Specimen: Nasopharyngeal Swab  Result Value Ref Range Status   SARS Coronavirus 2 NEGATIVE NEGATIVE Final    Comment: (NOTE) SARS-CoV-2 target nucleic acids are NOT DETECTED. The SARS-CoV-2 RNA is generally detectable in upper and lower respiratory specimens during the acute phase of infection. The lowest concentration of SARS-CoV-2 viral copies this assay can detect is 250 copies / mL. A negative result does not preclude SARS-CoV-2 infection and should not be used as the sole basis for treatment or other patient management decisions.  A negative result may occur with improper specimen collection / handling, submission of specimen other than nasopharyngeal swab,  presence of viral mutation(s) within the areas targeted by this assay, and inadequate number of viral copies (<250 copies / mL). A negative result must be combined with clinical observations, patient history, and epidemiological information. Fact Sheet for Patients:   StrictlyIdeas.no Fact Sheet for Healthcare Providers: BankingDealers.co.za This test is not yet approved or cleared  by the [place] FDA and has been authorized for detection and/or diagnosis of SARS-CoV-2 by FDA under an Emergency Use Authorization (EUA).  This EUA will remain in effect (meaning this test can be used) for the duration  of the COVID-19 declaration under Section 564(b)(1) of the Act, 21 U.S.C. section 360bbb-3(b)(1), unless the authorization is terminated or revoked sooner. Performed at [facility], [address] [place], [place] [address]   MRSA PCR Screening     Status: None   Collection Time: 07/23/19  4:53 PM   Specimen: Nasal Mucosa; Nasopharyngeal  Result Value Ref Range Status   MRSA by PCR NEGATIVE NEGATIVE Final    Comment:        The GeneXpert MRSA Assay (FDA approved for NASAL specimens only), is one component of a comprehensive MRSA colonization surveillance program. It is not intended to diagnose MRSA infection nor to guide or monitor treatment for MRSA infections. Performed at [facility], [address], [place], [place] [address]      Labs: BNP (last 3 results) No results for input(s): BNP in the last 8760 hours. Basic Metabolic Panel: Recent Labs  Lab 07/23/19 0629 07/23/19 0635 07/24/19 0417 07/25/19 0440  NA 141 140 136 139  K 4.3 4.1 3.7 4.5  CL 101 101 103 105  CO2 28  --  24 28  GLUCOSE 114* 109* 127* 130*  BUN 13 12 19 16   CREATININE 0.71 0.70 0.68 0.75  CALCIUM 8.8*  --  7.9* 8.5*  MG  --   --  1.8 1.9   Liver Function Tests: Recent Labs  Lab 07/23/19 0629 07/25/19 0440  AST 35 120*  ALT 44 100*   ALKPHOS 52 46  BILITOT 0.6 0.6  PROT 6.5 5.3*  ALBUMIN 3.7 2.9*   No results for input(s): LIPASE, AMYLASE in the last 168 hours. No results for input(s): AMMONIA in the last 168 hours. CBC: Recent Labs  Lab 07/23/19 0629 07/23/19 0635 07/24/19 0417 07/25/19 0440  WBC 5.6  --  4.5 4.4  NEUTROABS 4.6  --   --  2.8  HGB 11.3* 11.2* 9.1* 9.8*  HCT 35.4* 33.0* 28.0* 30.6*  MCV 99.7  --  96.6 98.1  PLT 154  --  121* 128*   Cardiac Enzymes: No results for input(s): CKTOTAL, CKMB, CKMBINDEX, TROPONINI in the last 168 hours. BNP: Invalid input(s): POCBNP CBG: Recent Labs  Lab 07/24/19 1107 07/24/19 1625 07/24/19 2142 07/25/19 0737 07/25/19 1140  GLUCAP 126* 138* 139* 123* 169*   D-Dimer No results for input(s): DDIMER in the last 72 hours. Hgb A1c Recent Labs    07/23/19 0629  HGBA1C 5.9*   Lipid Profile No results for input(s): CHOL, HDL, LDLCALC, TRIG, CHOLHDL, LDLDIRECT in the last 72 hours. Thyroid function studies Recent Labs    07/23/19 0629  TSH 0.706   Anemia work up Recent Labs    07/23/19 0629 07/23/19 1523  VITAMINB12 >7,500*  --   FOLATE 29.7  --   FERRITIN 101  --   TIBC 294  --   IRON 21*  --   RETICCTPCT  --  1.4   Urinalysis    Component Value Date/Time   COLORURINE YELLOW 07/23/2019 0623   APPEARANCEUR HAZY (A) 07/23/2019 0623   LABSPEC 1.012 07/23/2019 0623   PHURINE 6.0 07/23/2019 0623   GLUCOSEU NEGATIVE 07/23/2019 0623   HGBUR NEGATIVE 07/23/2019 0623   BILIRUBINUR NEGATIVE 07/23/2019 0623   KETONESUR NEGATIVE 07/23/2019 0623   PROTEINUR NEGATIVE 07/23/2019 0623   UROBILINOGEN 0.2 10/14/2014 0944   NITRITE NEGATIVE 07/23/2019 0623   LEUKOCYTESUR MODERATE (A) 07/23/2019 0623   Sepsis Labs Invalid input(s): PROCALCITONIN,  WBC,  LACTICIDVEN Microbiology Recent Results (from the past 240 hour(s))  Urine culture     Status: Abnormal (Preliminary result)   Collection Time: 07/23/19  6:29 AM   Specimen: Urine, Random   Result Value Ref Range Status   Specimen Description   Final    URINE, RANDOM Performed at [facility], [address], [place], [place] [address]    Special Requests   Final    NONE Performed at [facility], [address] [place], [place] [address]    Culture (A)  Final    >=100,000 COLONIES/mL ENTEROBACTER SPECIES CULTURE REINCUBATED FOR BETTER GROWTH Performed at [facility], [place] [address], [place], [place] [address]    Report Status PENDING  Incomplete  Culture, blood (routine x 2)     Status: None (Preliminary result)   Collection Time: 07/23/19  9:36 AM   Specimen: BLOOD  Result Value Ref Range Status   Specimen Description BLOOD LEFT ANTECUBITAL  Final   Special Requests   Final    BOTTLES DRAWN AEROBIC AND ANAEROBIC Blood Culture results may not be optimal due to an inadequate volume of blood received in culture bottles   Culture   Final    NO GROWTH < 24 HOURS Performed at [facility], [address], [place], [place] [address]    Report Status PENDING  Incomplete  Culture, blood (routine x 2)     Status: None (Preliminary result)   Collection Time: 07/23/19  9:43 AM   Specimen: BLOOD RIGHT HAND  Result Value Ref Range Status   Specimen Description BLOOD RIGHT HAND  Final   Special Requests   Final    BOTTLES DRAWN AEROBIC AND ANAEROBIC Blood Culture results may not be optimal due to an inadequate volume of blood received in culture bottles   Culture   Final    NO GROWTH < 24 HOURS Performed at [facility], [address] [place],  [address]    Report Status PENDING  Incomplete  SARS Coronavirus 2 by RT PCR (hospital order, performed in [place] hospital lab) Nasopharyngeal Nasopharyngeal Swab     Status: None   Collection Time: 07/23/19 10:32 AM   Specimen: Nasopharyngeal Swab  Result Value Ref Range Status   SARS Coronavirus 2 NEGATIVE NEGATIVE Final    Comment: (NOTE) SARS-CoV-2 target nucleic acids are NOT DETECTED. The SARS-CoV-2 RNA  is generally detectable in upper and lower respiratory specimens during the acute phase of infection. The lowest concentration of SARS-CoV-2 viral copies this assay can detect is 250 copies / mL. A negative result does not preclude SARS-CoV-2 infection and should not be used as the sole basis for treatment or other patient management decisions.  A negative result may occur with improper specimen collection / handling, submission of specimen other than nasopharyngeal swab, presence of viral mutation(s) within the areas targeted by this assay, and inadequate number of viral copies (<250 copies / mL). A negative result must be combined with clinical observations, patient history, and epidemiological information. Fact Sheet for Patients:   StrictlyIdeas.no Fact Sheet for Healthcare Providers: BankingDealers.co.za This test is not yet approved or cleared  by the [place] FDA and has been authorized for detection and/or diagnosis of SARS-CoV-2 by FDA under an Emergency Use Authorization (EUA).  This EUA will remain in effect (meaning this test can be used) for the duration of the COVID-19 declaration under Section 564(b)(1) of the Act, 21 U.S.C. section 360bbb-3(b)(1), unless the authorization is terminated or revoked sooner. Performed at [facility], [address], [place], [place]  [address]   MRSA PCR Screening     Status: None   Collection Time: 07/23/19  4:53 PM   Specimen: Nasal Mucosa; Nasopharyngeal  Result Value Ref Range Status   MRSA by PCR NEGATIVE NEGATIVE Final    Comment:        The GeneXpert MRSA Assay (FDA approved for NASAL specimens only), is one component of a comprehensive MRSA colonization surveillance program. It is not intended to diagnose MRSA infection nor to guide or monitor treatment for MRSA infections. Performed at [facility], [address], [place], [place] [address]     Time coordinating  discharge:   SIGNED:  Irwin Brakeman, MD  [facility] 07/25/2019, 12:24 PM How to contact the [facility] Attending or Consulting provider [place] or covering provider during after hours [place], for this patient?  1. Check the care team in [facility] and look for a) attending/consulting [facility] provider listed and b) the [facility] team listed 2. Log into [email or web address] and use [place]'s universal password to access. If you do not have the password, please contact the hospital operator. 3. Locate the [facility] provider you are looking for under [facility] and page to a number that you can be directly reached. 4. If you still have difficulty reaching the provider, please page the [facility] (Director on Call) for the Hospitalists listed on amion for assistance.

## 2019-07-25 NOTE — Patient Instructions (Addendum)
DUE TO COVID-19 ONLY ONE VISITOR IS ALLOWED TO COME WITH YOU AND STAY IN THE WAITING ROOM ONLY DURING PRE OP AND PROCEDURE DAY OF SURGERY. THE 1 VISITOR MAY VISIT WITH YOU AFTER SURGERY IN YOUR PRIVATE ROOM DURING VISITING HOURS ONLY!  YOU NEED TO HAVE A COVID 19 TEST ON 07-30-19 @ 10:30 am, THIS TEST MUST BE DONE BEFORE SURGERY, COME  [place], [place] , [address].  ([place]) ONCE YOUR COVID TEST IS COMPLETED, PLEASE BEGIN THE QUARANTINE INSTRUCTIONS AS OUTLINED IN YOUR HANDOUT.                [name].  07/25/2019   Your procedure is scheduled on: 08-02-19   Report to [facility] Main  Entrance    Report to Admitting at 7:30 AM     Call this number if you have problems the morning of surgery [phone]    Remember: AFTER MIDNIGHT THE NIGHT PRIOR TO SURGERY. NOTHING BY MOUTH EXCEPT CLEAR LIQUIDS UNTIL  7:00 AM. PLEASE FINISH G2 DRINK PER SURGEON ORDER  WHICH NEEDS TO BE COMPLETED AT 7:00 AM .    CLEAR LIQUID DIET   Foods Allowed                                                                     Foods Excluded  Coffee and tea, regular and decaf                             liquids that you cannot  Plain Jell-O any favor except red or purple                                           see through such as: Fruit ices (not with fruit pulp)                                     milk, soups, orange juice  Iced Popsicles                                    All solid food Carbonated beverages, regular and diet                                    Cranberry, grape and apple juices Sports drinks like Gatorade Lightly seasoned clear broth or consume(fat free) Sugar, honey syrup  Sample Menu Breakfast                                Lunch                                     Supper Cranberry juice  Beef broth                            Chicken broth Jell-O                                     Grape juice                            Apple juice Coffee or tea                        Jell-O                                      Popsicle                                                Coffee or tea                        Coffee or tea  _____________________________________________________________________    Take these medicines the morning of surgery with A SIP OF WATER: Wellbutrin XL, Clonazepam (Klonopin), Dutasteride (Avodart), Finasteride (Proscar), Gabapentin (Neurontin), and Flomax. You may also use your nasal spray and inhaler  BRUSH YOUR TEETH MORNING OF SURGERY AND RINSE YOUR MOUTH OUT, NO CHEWING GUM CANDY OR MINTS.   DO NOT TAKE ANY DIABETIC MEDICATIONS DAY OF YOUR SURGERY                               You may not have any metal on your body including hair pins and              piercings   Do not wear jewelry,cologne, lotions, powders or deodorant               Men may shave face and neck.   Do not bring valuables to the hospital. [place].  Contacts, dentures or bridgework may not be worn into surgery.  You may bring a small overnight bag     :  Special Instructions: N/A              Please read over the following fact sheets you were given: _____________________________________________________________________  How to Manage Your Diabetes Before and After Surgery  Why is it important to control my blood sugar before and after surgery? . Improving blood sugar levels before and after surgery helps healing and can limit problems. . A way of improving blood sugar control is eating a healthy diet by: o  Eating less sugar and carbohydrates o  Increasing activity/exercise o  Talking with your doctor about reaching your blood sugar goals . High blood sugars (greater than 180 mg/dL) can raise your risk of infections and slow your recovery, so you will need to focus on controlling your diabetes during the weeks before surgery. . Make sure that the doctor  who takes care of your diabetes knows  about your planned surgery including the date and location.  How do I manage my blood sugar before surgery? . Check your blood sugar at least 4 times a day, starting 2 days before surgery, to make sure that the level is not too high or low. o Check your blood sugar the morning of your surgery when you wake up and every 2 hours until you get to the Short Stay unit. . If your blood sugar is less than 70 mg/dL, you will need to treat for low blood sugar: o Do not take insulin. o Treat a low blood sugar (less than 70 mg/dL) with  cup of clear juice (cranberry or apple), 4 glucose tablets, OR glucose gel. o Recheck blood sugar in 15 minutes after treatment (to make sure it is greater than 70 mg/dL). If your blood sugar is not greater than 70 mg/dL on recheck, call [phone] for further instructions. . Report your blood sugar to the short stay nurse when you get to Short Stay.  . If you are admitted to the hospital after surgery: o Your blood sugar will be checked by the staff and you will probably be given insulin after surgery (instead of oral diabetes medicines) to make sure you have good blood sugar levels. o The goal for blood sugar control after surgery is 80-180 mg/dL.   WHAT DO I DO ABOUT MY DIABETES MEDICATION?  Marland Kitchen Do not take oral diabetes medicines (pills) the morning of surgery.  . THE DAY BEFORE SURGERY, take your usual prescribed Metformin and Actos      (correction) dose of insulin.     Reviewed and Endorsed by [facility] Patient Education Committee, August 2015  [facility]- Preparing for Total Shoulder Arthroplasty    Before surgery, you can play an important role. Because skin is not sterile, your skin needs to be as free of germs as possible. You can reduce the number of germs on your skin by using the following products. . Benzoyl Peroxide Gel o Reduces the number of germs present on the skin o Applied twice a day to shoulder area  starting two days before surgery    ==================================================================  Please follow these instructions carefully:  BENZOYL PEROXIDE 5% GEL  Please do not use if you have an allergy to benzoyl peroxide.   If your skin becomes reddened/irritated stop using the benzoyl peroxide.  Starting two days before surgery, apply as follows: 1. Apply benzoyl peroxide in the morning and at night. Apply after taking a shower. If you are not taking a shower clean entire shoulder front, back, and side along with the armpit with a clean wet washcloth.  2. Place a quarter-sized dollop on your shoulder and rub in thoroughly, making sure to cover the front, back, and side of your shoulder, along with the armpit.   2 days before ____ AM   ____ PM              1 day before ____ AM   ____ PM                         3. Do this twice a day for two days.  (Last application is the night before surgery, AFTER using the CHG soap as described below).  4. Do NOT apply benzoyl peroxide gel on the day of surgery.            [place] - Preparing for Surgery Before surgery, you can play  an important role.  Because skin is not sterile, your skin needs to be as free of germs as possible.  You can reduce the number of germs on your skin by washing with CHG (chlorahexidine gluconate) soap before surgery.  CHG is an antiseptic cleaner which kills germs and bonds with the skin to continue killing germs even after washing. Please DO NOT use if you have an allergy to CHG or antibacterial soaps.  If your skin becomes reddened/irritated stop using the CHG and inform your nurse when you arrive at Short Stay. Do not shave (including legs and underarms) for at least 48 hours prior to the first CHG shower.  You may shave your face/neck. Please follow these instructions carefully:  1.  Shower with CHG Soap the night before surgery and the  morning of Surgery.  2.  If you choose to wash your hair, wash  your hair first as usual with your  normal  shampoo.  3.  After you shampoo, rinse your hair and body thoroughly to remove the  shampoo.                           4.  Use CHG as you would any other liquid soap.  You can apply chg directly  to the skin and wash                       Gently with a scrungie or clean washcloth.  5.  Apply the CHG Soap to your body ONLY FROM THE NECK DOWN.   Do not use on face/ open                           Wound or open sores. Avoid contact with eyes, ears mouth and genitals (private parts).                       Wash face,  Genitals (private parts) with your normal soap.             6.  Wash thoroughly, paying special attention to the area where your surgery  will be performed.  7.  Thoroughly rinse your body with warm water from the neck down.  8.  DO NOT shower/wash with your normal soap after using and rinsing off  the CHG Soap.                9.  Pat yourself dry with a clean towel.            10.  Wear clean pajamas.            11.  Place clean sheets on your bed the night of your first shower and do not  sleep with pets. Day of Surgery : Do not apply any lotions/deodorants the morning of surgery.  Please wear clean clothes to the hospital/surgery center.  FAILURE TO FOLLOW THESE INSTRUCTIONS MAY RESULT IN THE CANCELLATION OF YOUR SURGERY PATIENT SIGNATURE_________________________________  NURSE SIGNATURE__________________________________  ________________________________________________________________________

## 2019-07-25 NOTE — Progress Notes (Signed)
PCP - Lajean Manes, MD Cardiologist -   Chest x-ray - 07-23-19 EKG - 07-23-19 Stress Test -  ECHO -  Cardiac Cath -   Sleep Study -  CPAP -   HGA1C 5.9 in Epic dated 07-23-19 Fasting Blood Sugar -  Checks Blood Sugar _____ times a day  Blood Thinner Instructions: Aspirin Instructions: 81 mg  Last Dose:  Anesthesia review:   Patient denies shortness of breath, fever, cough and chest pain at PAT appointment   Patient verbalized understanding of instructions that were given to them at the PAT appointment. Patient was also instructed that they will need to review over the PAT instructions again at home before surgery.

## 2019-07-25 NOTE — Progress Notes (Signed)
OT Cancellation Note  Patient Details Name: [name]. MRN: [id number] DOB: 01-Jun-1945   Cancelled Treatment:    Reason Eval/Treat Not Completed: Patient at procedure or test/ unavailable. Pt up in chair, just received breakfast and beginning to eat, nsg working with pt as well. Will check back at a later time for OT evaluation.   Guadelupe Sabin, OTR/L  [phone] 07/25/2019, 8:13 AM

## 2019-07-26 ENCOUNTER — Encounter (HOSPITAL_COMMUNITY): Payer: Self-pay

## 2019-07-26 ENCOUNTER — Encounter (HOSPITAL_COMMUNITY)
Admission: RE | Admit: 2019-07-26 | Discharge: 2019-07-26 | Disposition: A | Discharge status: Home / Self Care | Payer: Medicare Other | Source: Ambulatory Visit | Attending: Orthopedic Surgery | Admitting: Orthopedic Surgery

## 2019-07-26 ENCOUNTER — Other Ambulatory Visit: Payer: Self-pay

## 2019-07-26 ENCOUNTER — Encounter (HOSPITAL_COMMUNITY): Payer: Self-pay | Admitting: Physician Assistant

## 2019-07-26 DIAGNOSIS — Z9181 History of falling: Secondary | ICD-10-CM | POA: Diagnosis not present

## 2019-07-26 DIAGNOSIS — G4733 Obstructive sleep apnea (adult) (pediatric): Secondary | ICD-10-CM | POA: Diagnosis not present

## 2019-07-26 DIAGNOSIS — Z79891 Long term (current) use of opiate analgesic: Secondary | ICD-10-CM | POA: Diagnosis not present

## 2019-07-26 DIAGNOSIS — Z6826 Body mass index (BMI) 26.0-26.9, adult: Secondary | ICD-10-CM | POA: Diagnosis not present

## 2019-07-26 DIAGNOSIS — E1169 Type 2 diabetes mellitus with other specified complication: Secondary | ICD-10-CM | POA: Diagnosis not present

## 2019-07-26 DIAGNOSIS — N390 Urinary tract infection, site not specified: Secondary | ICD-10-CM | POA: Diagnosis not present

## 2019-07-26 DIAGNOSIS — J849 Interstitial pulmonary disease, unspecified: Secondary | ICD-10-CM | POA: Diagnosis not present

## 2019-07-26 DIAGNOSIS — Z7984 Long term (current) use of oral hypoglycemic drugs: Secondary | ICD-10-CM | POA: Diagnosis not present

## 2019-07-26 DIAGNOSIS — E1142 Type 2 diabetes mellitus with diabetic polyneuropathy: Secondary | ICD-10-CM | POA: Diagnosis not present

## 2019-07-26 DIAGNOSIS — S72411A Displaced unspecified condyle fracture of lower end of right femur, initial encounter for closed fracture: Secondary | ICD-10-CM | POA: Diagnosis not present

## 2019-07-26 DIAGNOSIS — E669 Obesity, unspecified: Secondary | ICD-10-CM | POA: Diagnosis not present

## 2019-07-26 DIAGNOSIS — M1990 Unspecified osteoarthritis, unspecified site: Secondary | ICD-10-CM | POA: Diagnosis not present

## 2019-07-26 DIAGNOSIS — Z79899 Other long term (current) drug therapy: Secondary | ICD-10-CM | POA: Diagnosis not present

## 2019-07-26 DIAGNOSIS — F419 Anxiety disorder, unspecified: Secondary | ICD-10-CM | POA: Diagnosis not present

## 2019-07-26 DIAGNOSIS — I058 Other rheumatic mitral valve diseases: Secondary | ICD-10-CM | POA: Diagnosis not present

## 2019-07-26 DIAGNOSIS — I951 Orthostatic hypotension: Secondary | ICD-10-CM | POA: Diagnosis not present

## 2019-07-26 DIAGNOSIS — J45909 Unspecified asthma, uncomplicated: Secondary | ICD-10-CM | POA: Diagnosis not present

## 2019-07-26 DIAGNOSIS — W19XXXD Unspecified fall, subsequent encounter: Secondary | ICD-10-CM | POA: Diagnosis not present

## 2019-07-26 DIAGNOSIS — S72414D Nondisplaced unspecified condyle fracture of lower end of right femur, subsequent encounter for closed fracture with routine healing: Secondary | ICD-10-CM | POA: Diagnosis not present

## 2019-07-26 DIAGNOSIS — N400 Enlarged prostate without lower urinary tract symptoms: Secondary | ICD-10-CM | POA: Diagnosis not present

## 2019-07-26 DIAGNOSIS — Z87891 Personal history of nicotine dependence: Secondary | ICD-10-CM | POA: Diagnosis not present

## 2019-07-26 DIAGNOSIS — F329 Major depressive disorder, single episode, unspecified: Secondary | ICD-10-CM | POA: Diagnosis not present

## 2019-07-26 DIAGNOSIS — D509 Iron deficiency anemia, unspecified: Secondary | ICD-10-CM | POA: Diagnosis not present

## 2019-07-26 DIAGNOSIS — B9689 Other specified bacterial agents as the cause of diseases classified elsewhere: Secondary | ICD-10-CM | POA: Diagnosis not present

## 2019-07-26 DIAGNOSIS — M50221 Other cervical disc displacement at C4-C5 level: Secondary | ICD-10-CM | POA: Diagnosis not present

## 2019-07-26 DIAGNOSIS — Z7982 Long term (current) use of aspirin: Secondary | ICD-10-CM | POA: Diagnosis not present

## 2019-07-26 HISTORY — DX: Hypotension, unspecified: I95.9

## 2019-07-26 LAB — URINE CULTURE: Culture: >=100000 — AB

## 2019-07-28 LAB — CULTURE, BLOOD (ROUTINE X 2)
Culture: NO GROWTH
Culture: NO GROWTH

## 2019-07-30 ENCOUNTER — Encounter (HOSPITAL_COMMUNITY): Payer: Medicare Other

## 2019-07-30 ENCOUNTER — Other Ambulatory Visit (HOSPITAL_COMMUNITY): Payer: Medicare Other

## 2019-07-30 DIAGNOSIS — E1142 Type 2 diabetes mellitus with diabetic polyneuropathy: Secondary | ICD-10-CM | POA: Diagnosis not present

## 2019-07-30 DIAGNOSIS — B9689 Other specified bacterial agents as the cause of diseases classified elsewhere: Secondary | ICD-10-CM | POA: Diagnosis not present

## 2019-07-30 DIAGNOSIS — W19XXXD Unspecified fall, subsequent encounter: Secondary | ICD-10-CM | POA: Diagnosis not present

## 2019-07-30 DIAGNOSIS — S72414A Nondisplaced unspecified condyle fracture of lower end of right femur, initial encounter for closed fracture: Secondary | ICD-10-CM | POA: Diagnosis not present

## 2019-07-30 DIAGNOSIS — N390 Urinary tract infection, site not specified: Secondary | ICD-10-CM | POA: Diagnosis not present

## 2019-07-30 DIAGNOSIS — S72414D Nondisplaced unspecified condyle fracture of lower end of right femur, subsequent encounter for closed fracture with routine healing: Secondary | ICD-10-CM | POA: Diagnosis not present

## 2019-07-30 DIAGNOSIS — M50221 Other cervical disc displacement at C4-C5 level: Secondary | ICD-10-CM | POA: Diagnosis not present

## 2019-07-30 DIAGNOSIS — S72434A Nondisplaced fracture of medial condyle of right femur, initial encounter for closed fracture: Secondary | ICD-10-CM | POA: Diagnosis not present

## 2019-07-30 DIAGNOSIS — M25561 Pain in right knee: Secondary | ICD-10-CM | POA: Diagnosis not present

## 2019-07-31 DIAGNOSIS — F3130 Bipolar disorder, current episode depressed, mild or moderate severity, unspecified: Secondary | ICD-10-CM | POA: Diagnosis not present

## 2019-08-01 DIAGNOSIS — B9689 Other specified bacterial agents as the cause of diseases classified elsewhere: Secondary | ICD-10-CM | POA: Diagnosis not present

## 2019-08-01 DIAGNOSIS — M47816 Spondylosis without myelopathy or radiculopathy, lumbar region: Secondary | ICD-10-CM | POA: Diagnosis not present

## 2019-08-01 DIAGNOSIS — S72414D Nondisplaced unspecified condyle fracture of lower end of right femur, subsequent encounter for closed fracture with routine healing: Secondary | ICD-10-CM | POA: Diagnosis not present

## 2019-08-01 DIAGNOSIS — N390 Urinary tract infection, site not specified: Secondary | ICD-10-CM | POA: Diagnosis not present

## 2019-08-01 DIAGNOSIS — W19XXXD Unspecified fall, subsequent encounter: Secondary | ICD-10-CM | POA: Diagnosis not present

## 2019-08-01 DIAGNOSIS — E1142 Type 2 diabetes mellitus with diabetic polyneuropathy: Secondary | ICD-10-CM | POA: Diagnosis not present

## 2019-08-01 DIAGNOSIS — M50221 Other cervical disc displacement at C4-C5 level: Secondary | ICD-10-CM | POA: Diagnosis not present

## 2019-08-02 ENCOUNTER — Ambulatory Visit (HOSPITAL_COMMUNITY): Admission: RE | Admit: 2019-08-02 | Payer: Medicare Other | Source: Home / Self Care | Admitting: Orthopedic Surgery

## 2019-08-02 ENCOUNTER — Encounter (HOSPITAL_COMMUNITY): Admission: RE | Payer: Self-pay | Source: Home / Self Care

## 2019-08-02 SURGERY — ARTHROPLASTY, SHOULDER, TOTAL, REVERSE
Anesthesia: General | Site: Shoulder | Laterality: Right

## 2019-08-06 DIAGNOSIS — B9689 Other specified bacterial agents as the cause of diseases classified elsewhere: Secondary | ICD-10-CM | POA: Diagnosis not present

## 2019-08-06 DIAGNOSIS — E1142 Type 2 diabetes mellitus with diabetic polyneuropathy: Secondary | ICD-10-CM | POA: Diagnosis not present

## 2019-08-06 DIAGNOSIS — W19XXXD Unspecified fall, subsequent encounter: Secondary | ICD-10-CM | POA: Diagnosis not present

## 2019-08-06 DIAGNOSIS — M50221 Other cervical disc displacement at C4-C5 level: Secondary | ICD-10-CM | POA: Diagnosis not present

## 2019-08-06 DIAGNOSIS — S72414D Nondisplaced unspecified condyle fracture of lower end of right femur, subsequent encounter for closed fracture with routine healing: Secondary | ICD-10-CM | POA: Diagnosis not present

## 2019-08-06 DIAGNOSIS — N390 Urinary tract infection, site not specified: Secondary | ICD-10-CM | POA: Diagnosis not present

## 2019-08-08 DIAGNOSIS — B9689 Other specified bacterial agents as the cause of diseases classified elsewhere: Secondary | ICD-10-CM | POA: Diagnosis not present

## 2019-08-08 DIAGNOSIS — M50221 Other cervical disc displacement at C4-C5 level: Secondary | ICD-10-CM | POA: Diagnosis not present

## 2019-08-08 DIAGNOSIS — N390 Urinary tract infection, site not specified: Secondary | ICD-10-CM | POA: Diagnosis not present

## 2019-08-08 DIAGNOSIS — E1142 Type 2 diabetes mellitus with diabetic polyneuropathy: Secondary | ICD-10-CM | POA: Diagnosis not present

## 2019-08-08 DIAGNOSIS — W19XXXD Unspecified fall, subsequent encounter: Secondary | ICD-10-CM | POA: Diagnosis not present

## 2019-08-08 DIAGNOSIS — S72414D Nondisplaced unspecified condyle fracture of lower end of right femur, subsequent encounter for closed fracture with routine healing: Secondary | ICD-10-CM | POA: Diagnosis not present

## 2019-08-13 DIAGNOSIS — B9689 Other specified bacterial agents as the cause of diseases classified elsewhere: Secondary | ICD-10-CM | POA: Diagnosis not present

## 2019-08-13 DIAGNOSIS — E1142 Type 2 diabetes mellitus with diabetic polyneuropathy: Secondary | ICD-10-CM | POA: Diagnosis not present

## 2019-08-13 DIAGNOSIS — W19XXXD Unspecified fall, subsequent encounter: Secondary | ICD-10-CM | POA: Diagnosis not present

## 2019-08-13 DIAGNOSIS — N390 Urinary tract infection, site not specified: Secondary | ICD-10-CM | POA: Diagnosis not present

## 2019-08-13 DIAGNOSIS — M50221 Other cervical disc displacement at C4-C5 level: Secondary | ICD-10-CM | POA: Diagnosis not present

## 2019-08-13 DIAGNOSIS — S72414D Nondisplaced unspecified condyle fracture of lower end of right femur, subsequent encounter for closed fracture with routine healing: Secondary | ICD-10-CM | POA: Diagnosis not present

## 2019-08-15 DIAGNOSIS — W19XXXD Unspecified fall, subsequent encounter: Secondary | ICD-10-CM | POA: Diagnosis not present

## 2019-08-15 DIAGNOSIS — M50221 Other cervical disc displacement at C4-C5 level: Secondary | ICD-10-CM | POA: Diagnosis not present

## 2019-08-15 DIAGNOSIS — E1142 Type 2 diabetes mellitus with diabetic polyneuropathy: Secondary | ICD-10-CM | POA: Diagnosis not present

## 2019-08-15 DIAGNOSIS — N390 Urinary tract infection, site not specified: Secondary | ICD-10-CM | POA: Diagnosis not present

## 2019-08-15 DIAGNOSIS — S72414D Nondisplaced unspecified condyle fracture of lower end of right femur, subsequent encounter for closed fracture with routine healing: Secondary | ICD-10-CM | POA: Diagnosis not present

## 2019-08-15 DIAGNOSIS — B9689 Other specified bacterial agents as the cause of diseases classified elsewhere: Secondary | ICD-10-CM | POA: Diagnosis not present

## 2019-08-16 DIAGNOSIS — Z79899 Other long term (current) drug therapy: Secondary | ICD-10-CM | POA: Diagnosis not present

## 2019-08-18 DIAGNOSIS — R8290 Unspecified abnormal findings in urine: Secondary | ICD-10-CM | POA: Diagnosis not present

## 2019-08-21 DIAGNOSIS — B9689 Other specified bacterial agents as the cause of diseases classified elsewhere: Secondary | ICD-10-CM | POA: Diagnosis not present

## 2019-08-21 DIAGNOSIS — S72414D Nondisplaced unspecified condyle fracture of lower end of right femur, subsequent encounter for closed fracture with routine healing: Secondary | ICD-10-CM | POA: Diagnosis not present

## 2019-08-21 DIAGNOSIS — N390 Urinary tract infection, site not specified: Secondary | ICD-10-CM | POA: Diagnosis not present

## 2019-08-21 DIAGNOSIS — E1142 Type 2 diabetes mellitus with diabetic polyneuropathy: Secondary | ICD-10-CM | POA: Diagnosis not present

## 2019-08-21 DIAGNOSIS — M50221 Other cervical disc displacement at C4-C5 level: Secondary | ICD-10-CM | POA: Diagnosis not present

## 2019-08-21 DIAGNOSIS — W19XXXD Unspecified fall, subsequent encounter: Secondary | ICD-10-CM | POA: Diagnosis not present

## 2019-08-25 DIAGNOSIS — G4733 Obstructive sleep apnea (adult) (pediatric): Secondary | ICD-10-CM | POA: Diagnosis not present

## 2019-08-25 DIAGNOSIS — F329 Major depressive disorder, single episode, unspecified: Secondary | ICD-10-CM | POA: Diagnosis not present

## 2019-08-25 DIAGNOSIS — Z9181 History of falling: Secondary | ICD-10-CM | POA: Diagnosis not present

## 2019-08-25 DIAGNOSIS — F419 Anxiety disorder, unspecified: Secondary | ICD-10-CM | POA: Diagnosis not present

## 2019-08-25 DIAGNOSIS — Z7984 Long term (current) use of oral hypoglycemic drugs: Secondary | ICD-10-CM | POA: Diagnosis not present

## 2019-08-25 DIAGNOSIS — Z7982 Long term (current) use of aspirin: Secondary | ICD-10-CM | POA: Diagnosis not present

## 2019-08-25 DIAGNOSIS — D509 Iron deficiency anemia, unspecified: Secondary | ICD-10-CM | POA: Diagnosis not present

## 2019-08-25 DIAGNOSIS — J849 Interstitial pulmonary disease, unspecified: Secondary | ICD-10-CM | POA: Diagnosis not present

## 2019-08-25 DIAGNOSIS — N390 Urinary tract infection, site not specified: Secondary | ICD-10-CM | POA: Diagnosis not present

## 2019-08-25 DIAGNOSIS — M1990 Unspecified osteoarthritis, unspecified site: Secondary | ICD-10-CM | POA: Diagnosis not present

## 2019-08-25 DIAGNOSIS — E1142 Type 2 diabetes mellitus with diabetic polyneuropathy: Secondary | ICD-10-CM | POA: Diagnosis not present

## 2019-08-25 DIAGNOSIS — S72414D Nondisplaced unspecified condyle fracture of lower end of right femur, subsequent encounter for closed fracture with routine healing: Secondary | ICD-10-CM | POA: Diagnosis not present

## 2019-08-25 DIAGNOSIS — Z79891 Long term (current) use of opiate analgesic: Secondary | ICD-10-CM | POA: Diagnosis not present

## 2019-08-25 DIAGNOSIS — N400 Enlarged prostate without lower urinary tract symptoms: Secondary | ICD-10-CM | POA: Diagnosis not present

## 2019-08-25 DIAGNOSIS — W19XXXD Unspecified fall, subsequent encounter: Secondary | ICD-10-CM | POA: Diagnosis not present

## 2019-08-25 DIAGNOSIS — J45909 Unspecified asthma, uncomplicated: Secondary | ICD-10-CM | POA: Diagnosis not present

## 2019-08-25 DIAGNOSIS — I951 Orthostatic hypotension: Secondary | ICD-10-CM | POA: Diagnosis not present

## 2019-08-25 DIAGNOSIS — Z6826 Body mass index (BMI) 26.0-26.9, adult: Secondary | ICD-10-CM | POA: Diagnosis not present

## 2019-08-25 DIAGNOSIS — I058 Other rheumatic mitral valve diseases: Secondary | ICD-10-CM | POA: Diagnosis not present

## 2019-08-25 DIAGNOSIS — E669 Obesity, unspecified: Secondary | ICD-10-CM | POA: Diagnosis not present

## 2019-08-25 DIAGNOSIS — M50221 Other cervical disc displacement at C4-C5 level: Secondary | ICD-10-CM | POA: Diagnosis not present

## 2019-08-25 DIAGNOSIS — B9689 Other specified bacterial agents as the cause of diseases classified elsewhere: Secondary | ICD-10-CM | POA: Diagnosis not present

## 2019-08-25 DIAGNOSIS — Z87891 Personal history of nicotine dependence: Secondary | ICD-10-CM | POA: Diagnosis not present

## 2019-08-27 DIAGNOSIS — M25561 Pain in right knee: Secondary | ICD-10-CM | POA: Diagnosis not present

## 2019-08-27 DIAGNOSIS — Z96651 Presence of right artificial knee joint: Secondary | ICD-10-CM | POA: Diagnosis not present

## 2019-09-19 ENCOUNTER — Other Ambulatory Visit: Payer: Self-pay

## 2019-09-19 ENCOUNTER — Inpatient Hospital Stay: Payer: Medicare Other | Attending: Oncology

## 2019-09-19 DIAGNOSIS — D508 Other iron deficiency anemias: Secondary | ICD-10-CM | POA: Diagnosis not present

## 2019-09-19 LAB — CBC WITH DIFFERENTIAL (CANCER CENTER ONLY)
Abs Immature Granulocytes: 0.01 10*3/uL (ref 0.00–0.07)
Basophils Absolute: 0.1 10*3/uL (ref 0.0–0.1)
Basophils Relative: 1 %
Eosinophils Absolute: 0.4 10*3/uL (ref 0.0–0.5)
Eosinophils Relative: 11 %
HCT: 34.1 % — ABNORMAL LOW (ref 39.0–52.0)
Hemoglobin: 10.8 g/dL — ABNORMAL LOW (ref 13.0–17.0)
Immature Granulocytes: 0 %
Lymphocytes Relative: 34 %
Lymphs Abs: 1.2 10*3/uL (ref 0.7–4.0)
MCH: 31.4 pg (ref 26.0–34.0)
MCHC: 31.7 g/dL (ref 30.0–36.0)
MCV: 99.1 fL (ref 80.0–100.0)
Monocytes Absolute: 0.4 10*3/uL (ref 0.1–1.0)
Monocytes Relative: 10 %
Neutro Abs: 1.6 10*3/uL — ABNORMAL LOW (ref 1.7–7.7)
Neutrophils Relative %: 44 %
Platelet Count: 140 10*3/uL — ABNORMAL LOW (ref 150–400)
RBC: 3.44 MIL/uL — ABNORMAL LOW (ref 4.22–5.81)
RDW: 12.7 % (ref 11.5–15.5)
WBC Count: 3.6 10*3/uL — ABNORMAL LOW (ref 4.0–10.5)
nRBC: 0 % (ref 0.0–0.2)

## 2019-09-19 LAB — IRON AND TIBC
Iron: 70 ug/dL (ref 42–163)
Saturation Ratios: 26 % (ref 20–55)
TIBC: 263 ug/dL (ref 202–409)
UIBC: 193 ug/dL (ref 117–376)

## 2019-09-19 LAB — FERRITIN: Ferritin: 112 ng/mL (ref 24–336)

## 2019-09-25 DIAGNOSIS — E1169 Type 2 diabetes mellitus with other specified complication: Secondary | ICD-10-CM | POA: Diagnosis not present

## 2019-09-25 DIAGNOSIS — Z7984 Long term (current) use of oral hypoglycemic drugs: Secondary | ICD-10-CM | POA: Diagnosis not present

## 2019-09-25 DIAGNOSIS — E7800 Pure hypercholesterolemia, unspecified: Secondary | ICD-10-CM | POA: Diagnosis not present

## 2019-09-25 DIAGNOSIS — Z79899 Other long term (current) drug therapy: Secondary | ICD-10-CM | POA: Diagnosis not present

## 2019-09-25 DIAGNOSIS — R600 Localized edema: Secondary | ICD-10-CM | POA: Diagnosis not present

## 2019-09-25 DIAGNOSIS — B351 Tinea unguium: Secondary | ICD-10-CM | POA: Diagnosis not present

## 2019-09-25 DIAGNOSIS — R413 Other amnesia: Secondary | ICD-10-CM | POA: Diagnosis not present

## 2019-10-02 DIAGNOSIS — M25551 Pain in right hip: Secondary | ICD-10-CM | POA: Diagnosis not present

## 2019-10-07 DIAGNOSIS — F3130 Bipolar disorder, current episode depressed, mild or moderate severity, unspecified: Secondary | ICD-10-CM | POA: Diagnosis not present

## 2019-10-07 DIAGNOSIS — M5416 Radiculopathy, lumbar region: Secondary | ICD-10-CM | POA: Diagnosis not present

## 2019-10-07 DIAGNOSIS — Z96642 Presence of left artificial hip joint: Secondary | ICD-10-CM | POA: Diagnosis not present

## 2019-10-07 DIAGNOSIS — M25552 Pain in left hip: Secondary | ICD-10-CM | POA: Diagnosis not present

## 2019-10-09 ENCOUNTER — Other Ambulatory Visit: Payer: Self-pay | Admitting: Internal Medicine

## 2019-10-24 DIAGNOSIS — G894 Chronic pain syndrome: Secondary | ICD-10-CM | POA: Diagnosis not present

## 2019-10-25 ENCOUNTER — Other Ambulatory Visit: Payer: Self-pay | Admitting: Geriatric Medicine

## 2019-10-25 DIAGNOSIS — J849 Interstitial pulmonary disease, unspecified: Secondary | ICD-10-CM

## 2019-10-29 DIAGNOSIS — M5136 Other intervertebral disc degeneration, lumbar region: Secondary | ICD-10-CM | POA: Diagnosis not present

## 2019-10-29 DIAGNOSIS — M47816 Spondylosis without myelopathy or radiculopathy, lumbar region: Secondary | ICD-10-CM | POA: Diagnosis not present

## 2019-10-29 DIAGNOSIS — M4316 Spondylolisthesis, lumbar region: Secondary | ICD-10-CM | POA: Diagnosis not present

## 2019-10-31 ENCOUNTER — Other Ambulatory Visit: Payer: Medicare Other

## 2019-11-05 ENCOUNTER — Ambulatory Visit
Admission: RE | Admit: 2019-11-05 | Discharge: 2019-11-05 | Disposition: A | Discharge status: Home / Self Care | Payer: Medicare Other | Source: Ambulatory Visit | Attending: Geriatric Medicine | Admitting: Geriatric Medicine

## 2019-11-05 DIAGNOSIS — J8410 Pulmonary fibrosis, unspecified: Secondary | ICD-10-CM | POA: Diagnosis not present

## 2019-11-05 DIAGNOSIS — J439 Emphysema, unspecified: Secondary | ICD-10-CM | POA: Diagnosis not present

## 2019-11-05 DIAGNOSIS — J849 Interstitial pulmonary disease, unspecified: Secondary | ICD-10-CM

## 2019-11-05 DIAGNOSIS — I313 Pericardial effusion (noninflammatory): Secondary | ICD-10-CM | POA: Diagnosis not present

## 2019-11-05 DIAGNOSIS — J984 Other disorders of lung: Secondary | ICD-10-CM | POA: Diagnosis not present

## 2019-11-07 DIAGNOSIS — M5126 Other intervertebral disc displacement, lumbar region: Secondary | ICD-10-CM | POA: Diagnosis not present

## 2019-11-07 DIAGNOSIS — Z6828 Body mass index (BMI) 28.0-28.9, adult: Secondary | ICD-10-CM | POA: Diagnosis not present

## 2019-11-07 DIAGNOSIS — M48061 Spinal stenosis, lumbar region without neurogenic claudication: Secondary | ICD-10-CM | POA: Diagnosis not present

## 2019-11-07 DIAGNOSIS — M4316 Spondylolisthesis, lumbar region: Secondary | ICD-10-CM | POA: Diagnosis not present

## 2019-11-07 DIAGNOSIS — S3219XA Other fracture of sacrum, initial encounter for closed fracture: Secondary | ICD-10-CM | POA: Diagnosis not present

## 2019-11-07 DIAGNOSIS — M47816 Spondylosis without myelopathy or radiculopathy, lumbar region: Secondary | ICD-10-CM | POA: Diagnosis not present

## 2019-11-07 DIAGNOSIS — Z981 Arthrodesis status: Secondary | ICD-10-CM | POA: Diagnosis not present

## 2019-11-08 ENCOUNTER — Telehealth: Payer: Self-pay | Admitting: Internal Medicine

## 2019-11-08 ENCOUNTER — Encounter: Payer: Self-pay | Admitting: Cardiovascular Disease

## 2019-11-08 ENCOUNTER — Other Ambulatory Visit: Payer: Self-pay

## 2019-11-08 ENCOUNTER — Ambulatory Visit (INDEPENDENT_AMBULATORY_CARE_PROVIDER_SITE_OTHER): Payer: Medicare Other | Admitting: Cardiovascular Disease

## 2019-11-08 VITALS — BP 105/60 | HR 80 | Ht 73.0 in | Wt 265.4 lb

## 2019-11-08 DIAGNOSIS — G4733 Obstructive sleep apnea (adult) (pediatric): Secondary | ICD-10-CM

## 2019-11-08 DIAGNOSIS — I493 Ventricular premature depolarization: Secondary | ICD-10-CM

## 2019-11-08 DIAGNOSIS — I340 Nonrheumatic mitral (valve) insufficiency: Secondary | ICD-10-CM

## 2019-11-08 DIAGNOSIS — I700 Atherosclerosis of aorta: Secondary | ICD-10-CM

## 2019-11-08 DIAGNOSIS — Z01810 Encounter for preprocedural cardiovascular examination: Secondary | ICD-10-CM

## 2019-11-08 DIAGNOSIS — Z9989 Dependence on other enabling machines and devices: Secondary | ICD-10-CM | POA: Diagnosis not present

## 2019-11-08 DIAGNOSIS — J8410 Pulmonary fibrosis, unspecified: Secondary | ICD-10-CM

## 2019-11-08 DIAGNOSIS — Z8639 Personal history of other endocrine, nutritional and metabolic disease: Secondary | ICD-10-CM | POA: Diagnosis not present

## 2019-11-08 DIAGNOSIS — E669 Obesity, unspecified: Secondary | ICD-10-CM

## 2019-11-08 DIAGNOSIS — E7800 Pure hypercholesterolemia, unspecified: Secondary | ICD-10-CM | POA: Diagnosis not present

## 2019-11-08 DIAGNOSIS — E668 Other obesity: Secondary | ICD-10-CM | POA: Diagnosis not present

## 2019-11-08 DIAGNOSIS — I2510 Atherosclerotic heart disease of native coronary artery without angina pectoris: Secondary | ICD-10-CM | POA: Diagnosis not present

## 2019-11-08 NOTE — Progress Notes (Signed)
Cardiology office note   Date:  11/10/2019   ID:  [name]., DOB Dec 15, 1945, MRN [id number]  PCP:  Lajean Manes, MD  Cardiologist:  Savhanna Sliva Electrophysiologist:  None   Evaluation Performed:  Follow-Up Visit  Chief Complaint: Preoperative evaluation  History of Present Illness:    Clemon Gillespie. is a 74 y.o. male with known moderate CAD managed medically, mitral valve prolapse of the anterior leaflet with mild to moderate mitral insufficiency, diabetes mellitus, hyperlipidemia, symptomatic PVCs, OSA on CPAP, pulmonary fibrosis, aortic atherosclerosis.  He has not had any cardiovascular problems, but is severely limited by back pain.  He has new spondylolisthesis of L4 and has previously had an L5-S1 fusion.  In May he had Enterobacter urinary tract infection with sepsis complicated by syncope and a fall leading to fracture of the distal femur above his previously placed prosthesis.  His orthopedic surgeon is Dr. Veverly Fells.  He never really recovered full functional status since that hospitalization, due to his back problems.  Prior to the hospitalization he was walking 2 miles daily without any cardiovascular complaints.  He did not have angina or dyspnea with moderate activity.  He still denies any problems with chest pain or shortness of breath, has not experienced any new episodes of syncope.  He does not have edema, orthopnea, PND, palpitations or new focal neurological deficits.    The patient specifically denies any chest pain at rest exertion, dyspnea at rest or with exertion, orthopnea, paroxysmal nocturnal dyspnea, syncope, palpitations, focal neurological deficits, intermittent claudication, lower extremity edema, unexplained weight gain, cough, hemoptysis or wheezing.  Cardiac catheterization in 2008 showed a 95% stenosis in a small third diagonal artery branch, with mild plaque elsewhere.  He had a low risk nuclear stress test in April 2017.  Echo in 2017 showed  LVEF 65-70% with mild mitral regurgitation.  He does not have congestive heart failure.    Past Medical History:  Diagnosis Date  . Arthritis   . Asthma    anxiety, tree/grass pollen  . BPH (benign prostatic hyperplasia)   . Bronchitis    hx of  . Bulge of cervical disc without myelopathy   . Chest pain    a. 06/2015 MV: EF 56%, no ischemia/infarct.  . Complication of anesthesia 07/30/82   no complications but just says he typically needs "more than expected" to anesthetize; needs CPAP (setting 10) in recovery  . Depression    ADD  . Diabetes mellitus    PCP [name], type 2  . Insomnia   . Low blood pressure   . Mitral valve prolapse    a. 06/2015 Echo: EF 65-70%, mild LVH, no rwma, mild MVP of ant leaflet and mild to mod posteriorly directed MR, mildly dil La.  . Obesity   . PVC's (premature ventricular contractions)   . Recurrent upper respiratory infection (URI)    states Dr Wynelle Link aware- no fever- states is improving  . Sleep apnea    settings 10.6  / followed by [name] study years ago  . Tremor    d/t lithium   Past Surgical History:  Procedure Laterality Date  . CARDIAC CATHETERIZATION     2008 pre op gastric bypass  . COLONOSCOPY W/ POLYPECTOMY    . 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 JOINT REPLACEMENT     Left, Right Knee, Right hip  . KNEE ARTHROSCOPY     right x 3-4, left x 2  .  KNEE ARTHROTOMY  ~1991   right  . LUMBAR FUSION  04/03/2013   [name], L5-S1  . POLYPECTOMY     vocal cords 1992  . 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   Procedure: LEFT TOTAL HIP ARTHROPLASTY ANTERIOR APPROACH;  Surgeon: [name], MD;  Location: [facility];  Service: Orthopedics;  Laterality: Left;  . TOTAL HIP REVISION  04/22/2011   Procedure: TOTAL HIP REVISION;  Surgeon: Gearlean Alf, MD;  Location: [facility];  Service: Orthopedics;  Laterality: Right;     Current Meds  Medication Sig  . acetaminophen (TYLENOL) 500 MG tablet Take 1,000 mg by mouth daily as needed  for headache.  . albuterol (VENTOLIN HFA) 108 (90 Base) MCG/ACT inhaler TAKE 2 PUFFS BY MOUTH EVERY 6 HOURS AS NEEDED FOR WHEEZE OR SHORTNESS OF BREATH  . aspirin EC 81 MG tablet Take 81 mg by mouth daily as needed for mild pain.  . clonazePAM (KLONOPIN) 0.5 MG tablet Take 0.5 mg by mouth daily.   Marland Kitchen dutasteride (AVODART) 0.5 MG capsule Take 0.5 mg by mouth daily.  . finasteride (PROSCAR) 5 MG tablet Take 1 tablet by mouth daily.  . fluticasone (FLONASE) 50 MCG/ACT nasal spray Place 2 sprays into the nose 2 (two) times daily.  Marland Kitchen gabapentin (NEURONTIN) 300 MG capsule Take 1 capsule by mouth at bedtime. Pt takes 1 capsule in morning and 1 capsule by mouth at bedtime.  Marland Kitchen HYDROcodone-acetaminophen (NORCO) 10-325 MG per tablet Take 1-2 tablets by mouth every 6 (six) hours as needed for moderate pain. (Patient taking differently: Take 1 tablet by mouth in the morning, at noon, and at bedtime. )  . metFORMIN (GLUCOPHAGE) 500 MG tablet Take 500 mg by mouth 2 (two) times daily with a meal.   . methocarbamol (ROBAXIN) 500 MG tablet Take 1,000 mg by mouth at bedtime.   . midodrine (PROAMATINE) 10 MG tablet Take 10 mg by mouth 3 (three) times daily.  . Multiple Vitamins-Minerals (CENTRUM SILVER ADULT 50+ PO) Take 1 tablet by mouth daily.  . pravastatin (PRAVACHOL) 40 MG tablet Take 40 mg by mouth daily.  . tamsulosin (FLOMAX) 0.4 MG CAPS capsule Take 0.4 mg by mouth daily.  Marland Kitchen UNABLE TO FIND Med Name: CPAP 10 cm     Allergies:   Breo ellipta [fluticasone furoate-vilanterol], Demerol [meperidine], Lamotrigine, and Nsaids   Social History   Tobacco Use  . Smoking status: Former Smoker    Packs/day: 1.50    Years: 20.00    Pack years: 30.00    Types: Cigarettes    Quit date: 04/17/1990    Years since quitting: 29.5  . Smokeless tobacco: Never Used  Vaping Use  . Vaping Use: Never used  Substance Use Topics  . Alcohol use: No  . Drug use: No     Family Hx: The patient's family history includes  Anxiety disorder in his mother; Asthma in his maternal grandmother; Heart disease in his father.  ROS:   Please see the history of present illness.    All other systems are reviewed and are negative.   Prior CV studies:   The following studies were reviewed today: High resolution chest CT November 05, 2019 1. 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 Stable mild patchy air trapping in both lungs. 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 Fibrotic NSIP is on the differential. Findings are suggestive of an  alternative diagnosis (not UIP) per consensus guidelines: Diagnosis of Idiopathic Pulmonary Fibrosis: An Official ATS/ERS/JRS/ALAT Clinical Practice Guideline. [place], Iss 5, [phone], Nov 12 2016. 2. Three-vessel coronary atherosclerosis. 3. Small pericardial effusion/thickening, slightly increased. 4. Aortic Atherosclerosis (ICD10-I70.0) and Emphysema (ICD10-J43.9).  Labs/Other Tests and Data Reviewed:    EKG: Normal sinus rhythm, old RBBB, PVCs occasional pattern of bigeminy.  No ischemic changes were seen  Recent Labs:  BMET    Component Value Date/Time   NA 139 07/25/2019 0440   K 4.5 07/25/2019 0440   CL 105 07/25/2019 0440   CO2 28 07/25/2019 0440   GLUCOSE 130 (H) 07/25/2019 0440   BUN 16 07/25/2019 0440   CREATININE 0.75 07/25/2019 0440   CREATININE 0.85 02/04/2019 0854   CALCIUM 8.5 (L) 07/25/2019 0440   GFRNONAA >60 07/25/2019 0440   GFRNONAA >60 02/04/2019 0854   GFRAA >60 07/25/2019 0440   GFRAA >60 02/04/2019 0854    LABS from 09/06/2018  Creatinine 0.86, hemoglobin A1c 6.4%, glucose 108, hemoglobin 11.9, potassium 4.7, ferritin 12, normal liver function tests  LABS from 02/02/2018 Total cholesterol 141, HDL 43, LDL 73, triglycerides 125  Labs from  09/25/2019 Hemoglobin A1c 5.6%, hemoglobin 11.5, creatinine 0.99, potassium 4.7, normal liver function tests  Recent Lipid Panel Lab Results  Component Value Date/Time   CHOL 218 (H) 06/29/2015 07:29 PM   TRIG 173 (H) 06/29/2015 07:29 PM   HDL 42 06/29/2015 07:29 PM   CHOLHDL 5.2 06/29/2015 07:29 PM   LDLCALC 141 (H) 06/29/2015 07:29 PM   Labs from 01/18/2019 Total cholesterol 126, HDL 54, LDL 53, triglycerides 92  Wt Readings from Last 3 Encounters:  11/08/19 265 lb 6.4 oz (120.4 kg)  07/26/19 202 lb (91.6 kg)  07/24/19 228 lb 2.8 oz (103.5 kg)     Objective:    Vital Signs:  BP 105/60   Pulse 80   Ht 6\' 1"  (1.854 m)   Wt 265 lb 6.4 oz (120.4 kg)   SpO2 98%   BMI 35.02 kg/m     General: Alert, oriented x3, no distress Head: no evidence of trauma, PERRL, EOMI, no exophtalmos or lid lag, no myxedema, no xanthelasma; normal ears, nose and oropharynx Neck: normal jugular venous pulsations and no hepatojugular reflux; brisk carotid pulses without delay and no carotid bruits Chest: clear to auscultation, no signs of consolidation by percussion or palpation, normal fremitus, symmetrical and full respiratory excursions Cardiovascular: normal position and quality of the apical impulse, regular rhythm, normal first and widely split second heart sounds, midsystolic click and 2-4/5 late systolic murmur at the apex, no diastolic murmurs, rubs or gallops Abdomen: no tenderness or distention, no masses by palpation, no abnormal pulsatility or arterial bruits, normal bowel sounds, no hepatosplenomegaly Extremities: no clubbing, cyanosis or edema; 2+ radial, ulnar and brachial pulses bilaterally; 2+ right femoral, posterior tibial and dorsalis pedis pulses; 2+ left femoral, posterior tibial and dorsalis pedis pulses; no subclavian or femoral bruits Neurological: grossly nonfocal Psych: Normal mood and affect   ASSESSMENT & PLAN:    1. Coronary artery disease involving native coronary  artery of native heart without angina pectoris   2. Aortic atherosclerosis ([place])   3. Pulmonary fibrosis ([place])   4. Nonrheumatic mitral valve regurgitation   5. PVCs (premature ventricular contractions)   6. OSA on CPAP   7. History of diabetes mellitus, type II   8. Hypercholesterolemia   9. Moderate obesity   10. Preoperative cardiovascular examination  1. CAD: Previous cardiac catheterization showed plaque in all major coronary arteries, but he only had a significant stenosis in a very small and distal diagonal branch.  He does not have angina pectoris.  All his risk factors are pretty well addressed except for the fact that he can no longer be active due to his orthopedic/spine issues.  Before this started, he had excellent functional status as recently as May 2021. 2. Aortic atherosclerosis: Normal caliber aorta with no symptoms of PAD. Dilated pulmonary artery: This was reported on a previous CT of the chest, but not commented on the most recent scan. 3. Pulmonary fibrosis: Substantial, but stable abnormalities are seen on his chest CT.  Note that in 2018 he had pulmonary spirometry which showed FVC of 97% of predicted and FEV1 greater than 100% of predicted.   4. MVP/MR: he does not have symptoms that would suggest progression of his mitral regurgitation.  The murmur is unchanged 5. PVCs: Longstanding finding.  Quite a few seen on the EKG today and heard during his physical exam but he remains unaware of them. 6. OSA: He reports compliance with CPAP and denies daytime hypersomnolence. 7. DM: Hemoglobin A1c is now completely normal range. 8. HLP: All lipid parameters are in target range. 9. Moderate obesity: Excellent success with weight loss until his recent injury, now he is back in obese range. 10. Preop CV exam: I think he is at low risk for major cardiovascular complications with the planned spinal surgery.  Okay to temporarily discontinue his aspirin.    COVID-19  Education: The signs and symptoms of COVID-19 were discussed with the patient and how to seek care for testing (follow up with PCP or arrange E-visit).  The importance of social distancing was discussed today.  Time:   Today, I have spent 21 minutes with the patient with telehealth technology discussing the above problems.     Medication Adjustments/Labs and Tests Ordered: Current medicines are reviewed at length with the patient today.  Concerns regarding medicines are outlined above.   Tests Ordered: Orders Placed This Encounter  Procedures  . EKG 12-Lead    Medication Changes: No orders of the defined types were placed in this encounter.   Follow Up:  Virtual Visit or In Person 1 year  Signed, Sanda Klein, MD  11/10/2019 4:07 PM    [place]

## 2019-11-08 NOTE — Patient Instructions (Signed)

## 2019-11-08 NOTE — Telephone Encounter (Signed)
Spoke with patient. I let her know that we have received the form and will place the form on MW's desk. Surgery appears to a spinal fusion.   MW, please advise once you have reviewed the forms. Thanks!

## 2019-11-10 ENCOUNTER — Encounter: Payer: Self-pay | Admitting: Cardiovascular Disease

## 2019-11-11 NOTE — Telephone Encounter (Signed)
Routing to Dr. Elsworth Soho as pt will need to be cleared by Dr. Elsworth Soho. Dr. Elsworth Soho, please advise.

## 2019-11-11 NOTE — Telephone Encounter (Signed)
I  No longer see this pt - he was directed to f/u with alva (see my last ov) so I can clear him from pulmonary perspective but not from sleep issues

## 2019-11-11 NOTE — Telephone Encounter (Signed)
Patient scheduled for follow up appointment for surgical clearance from OSA standpoint per Dr. Elsworth Soho. Patient sees Dr. Melvyn Novas for asthma but has not been seen for OSA since 2016.

## 2019-11-11 NOTE — Telephone Encounter (Signed)
He was last seen 11/2014 by me Will need OV with me /APP for clearance from OSA standpoint, if required

## 2019-11-12 ENCOUNTER — Telehealth: Payer: Self-pay | Admitting: Primary Care

## 2019-11-12 NOTE — Telephone Encounter (Signed)
Pt scheduled to see Beth on 9/2 for a surgical consult.  Beth please advise if this can be done virtually or if pt needs to come in to be seen for evaluation.

## 2019-11-12 NOTE — Telephone Encounter (Signed)
Needs to come in

## 2019-11-12 NOTE — Telephone Encounter (Signed)
Called and spoke with [name].  Beth, NP's recommendations given.  Understanding stated. [name] requested OV 11/13/19.  Patient scheduled for in office visit, with Beth,NP 11/13/19, at 1000.

## 2019-11-13 ENCOUNTER — Ambulatory Visit (INDEPENDENT_AMBULATORY_CARE_PROVIDER_SITE_OTHER): Payer: Medicare Other | Admitting: Primary Care

## 2019-11-13 ENCOUNTER — Encounter: Payer: Self-pay | Admitting: Primary Care

## 2019-11-13 DIAGNOSIS — J45991 Cough variant asthma: Secondary | ICD-10-CM | POA: Diagnosis not present

## 2019-11-13 DIAGNOSIS — Z01811 Encounter for preprocedural respiratory examination: Secondary | ICD-10-CM | POA: Insufficient documentation

## 2019-11-13 DIAGNOSIS — G4733 Obstructive sleep apnea (adult) (pediatric): Secondary | ICD-10-CM | POA: Diagnosis not present

## 2019-11-13 DIAGNOSIS — Z9989 Dependence on other enabling machines and devices: Secondary | ICD-10-CM | POA: Diagnosis not present

## 2019-11-13 NOTE — Assessment & Plan Note (Addendum)
-   Respiratory exam benign. VSS. Medically cleared from pulmonary standpoint for back surgery  Recommend 1. Short duration of surgery as much as possible and avoid paralytic if possible 2. Recovery in step down or ICU with Pulmonary consultation if needed  3. DVT prophylaxis 4. Aggressive pulmonary toilet with o2, bronchodilatation, and incentive spirometry and early ambulation

## 2019-11-13 NOTE — Assessment & Plan Note (Deleted)
-   Medically cleared from pulmonary standpoint for back surgery  Recommend 1. Short duration of surgery as much as possible and avoid paralytic if possible 2. Recovery in step down or ICU with Pulmonary consultation if needed  3. DVT prophylaxis 4. Aggressive pulmonary toilet with o2, bronchodilatation, and incentive spirometry and early ambulation

## 2019-11-13 NOTE — Assessment & Plan Note (Signed)
-  Patient is 100% compliant with CPAP and reports benefit from use -Pressure 10 cm H2O; AHI 2.7 -No changes -Follow-up with Dr. Elsworth Soho annually

## 2019-11-13 NOTE — Assessment & Plan Note (Signed)
-   No respiratory complaints, lung function is normal - FU with Dr. Melvyn Novas in 6 months

## 2019-11-13 NOTE — Progress Notes (Signed)
@Patient  ID: [name]., male    DOB: [birth year], 74 y.o.   MRN: [id number]  Chief Complaint  Patient presents with  . Follow-up    Referring provider: Lajean Manes, MD  HPI: 74 year old male, former smoker. PMH significant for ILD, cough variant asthma, OSA on CPAP. Patient of Dr. Melvyn Novas and Dr. Elsworth Soho manages sleep.   11/13/2019 Patient presents today for pre-op clearance for back surgery. Accompanied by his wife. Patient states that his breathing has been fine. He has no respiratory complaints or recent exacerbations. Lung function is normal. Patient is partially dependent since fall in July. Currently ambulating with cane. No surgical date has been scheduled. He is complaint with CPAP and reports benefit from use. He is not on oxygen. DME company is [employer].  Denies shortness of breath, cough, chest tightness of wheezing.   Imaging: CT chest in August showed spectrum findings compatible fibrotic interstitial lung disease without frank honeycombing. Findings suggestive of chronic hypersensitivity pneumonia. No respiratory complaints. No fevers.   Pulmonary testing: Spirometry 11/23/2016  FEV1 4.06 (106%)  Ratio 82 with min curvature  Off all rx   Airview download 10/14/19-11/02/19: 30/30 days used; 97% > 4 hours Average usage 7 hours 21 mins Pressure 10cm h20 AHI 2.6  Allergies  Allergen Reactions  . Breo Ellipta [Fluticasone Furoate-Vilanterol] Other (See Comments)    Thrush  . Demerol [Meperidine] Itching  . Lamotrigine Hives and Itching  . Nsaids Nausea Only    Gastric bypass    Immunization History  Administered Date(s) Administered  . Influenza Whole 12/13/2011  . Influenza, High Dose Seasonal PF 12/03/2018  . Influenza,inj,Quad PF,6+ Mos 12/20/2012, 12/01/2014  . Moderna SARS-COVID-2 Vaccination 04/15/2019, 05/14/2019    Past Medical History:  Diagnosis Date  . Arthritis   . Asthma    anxiety, tree/grass pollen  . BPH (benign prostatic hyperplasia)   .  Bronchitis    hx of  . Bulge of cervical disc without myelopathy   . Chest pain    a. 06/2015 MV: EF 56%, no ischemia/infarct.  . Complication of anesthesia 7/61/95   no complications but just says he typically needs "more than expected" to anesthetize; needs CPAP (setting 10) in recovery  . Depression    ADD  . Diabetes mellitus    PCP [name], type 2  . Insomnia   . Low blood pressure   . Mitral valve prolapse    a. 06/2015 Echo: EF 65-70%, mild LVH, no rwma, mild MVP of ant leaflet and mild to mod posteriorly directed MR, mildly dil La.  . Obesity   . PVC's (premature ventricular contractions)   . Recurrent upper respiratory infection (URI)    states Dr Wynelle Link aware- no fever- states is improving  . Sleep apnea    settings 10.6  / followed by [name] study years ago  . Tremor    d/t lithium    Tobacco History: Social History   Tobacco Use  Smoking Status Former Smoker  . Packs/day: 1.50  . Years: 20.00  . Pack years: 30.00  . Types: Cigarettes  . Quit date: 04/17/1990  . Years since quitting: 29.5  Smokeless Tobacco Never Used   Counseling given: Not Answered   Outpatient Medications Prior to Visit  Medication Sig Dispense Refill  . acetaminophen (TYLENOL) 500 MG tablet Take 1,000 mg by mouth daily as needed for headache.    . albuterol (VENTOLIN HFA) 108 (90 Base) MCG/ACT inhaler TAKE 2 PUFFS BY MOUTH  EVERY 6 HOURS AS NEEDED FOR WHEEZE OR SHORTNESS OF BREATH 4 g 3  . aspirin EC 81 MG tablet Take 81 mg by mouth daily as needed for mild pain.    . clonazePAM (KLONOPIN) 0.5 MG tablet Take 0.5 mg by mouth daily.     Marland Kitchen dutasteride (AVODART) 0.5 MG capsule Take 0.5 mg by mouth daily.    . finasteride (PROSCAR) 5 MG tablet Take 1 tablet by mouth daily.    . fluticasone (FLONASE) 50 MCG/ACT nasal spray Place 2 sprays into the nose 2 (two) times daily.    Marland Kitchen gabapentin (NEURONTIN) 300 MG capsule Take 1 capsule by mouth at bedtime. Pt takes 1 capsule in morning and 1  capsule by mouth at bedtime.    Marland Kitchen HYDROcodone-acetaminophen (NORCO) 10-325 MG per tablet Take 1-2 tablets by mouth every 6 (six) hours as needed for moderate pain. (Patient taking differently: Take 1 tablet by mouth in the morning, at noon, and at bedtime. ) 90 tablet 0  . metFORMIN (GLUCOPHAGE) 500 MG tablet Take 500 mg by mouth 2 (two) times daily with a meal.     . methocarbamol (ROBAXIN) 500 MG tablet Take 1,000 mg by mouth at bedtime.     . midodrine (PROAMATINE) 10 MG tablet Take 10 mg by mouth 3 (three) times daily.    . Multiple Vitamins-Minerals (CENTRUM SILVER ADULT 50+ PO) Take 1 tablet by mouth daily.    . pravastatin (PRAVACHOL) 40 MG tablet Take 40 mg by mouth daily.    . tamsulosin (FLOMAX) 0.4 MG CAPS capsule Take 0.4 mg by mouth daily.    Marland Kitchen UNABLE TO FIND Med Name: CPAP 10 cm     No facility-administered medications prior to visit.   Review of Systems  Review of Systems  Constitutional: Negative.   Respiratory: Negative for cough, chest tightness, shortness of breath and wheezing.     Physical Exam  BP 122/68 (BP Location: Left Arm, Cuff Size: Normal)   Pulse 63   Temp 98.4 F (36.9 C) (Oral)   Ht 6\' 1"  (1.854 m)   Wt 215 lb (97.5 kg)   SpO2 97%   BMI 28.37 kg/m  Physical Exam Constitutional:      Appearance: Normal appearance.  HENT:     Head: Normocephalic and atraumatic.  Cardiovascular:     Rate and Rhythm: Normal rate and regular rhythm.     Comments: RRR; Trace BLE edema  Pulmonary:     Effort: Pulmonary effort is normal. No respiratory distress.     Breath sounds: No stridor. No wheezing or rhonchi.     Comments: Mostly clear, fine rales right mid-lobe  Musculoskeletal:     Comments: In [place]; ambulates with cane   Skin:    General: Skin is warm and dry.  Neurological:     General: No focal deficit present.     Mental Status: He is alert and oriented to person, place, and time. Mental status is at baseline.  Psychiatric:        Mood and Affect:  Mood normal.        Behavior: Behavior normal.        Thought Content: Thought content normal.        Judgment: Judgment normal.      Lab Results:  CBC    Component Value Date/Time   WBC 3.6 (L) 09/19/2019 1100   WBC 4.4 07/25/2019 0440   RBC 3.44 (L) 09/19/2019 1100   HGB 10.8 (L) 09/19/2019 1100  HCT 34.1 (L) 09/19/2019 1100   PLT 140 (L) 09/19/2019 1100   MCV 99.1 09/19/2019 1100   MCH 31.4 09/19/2019 1100   MCHC 31.7 09/19/2019 1100   RDW 12.7 09/19/2019 1100   LYMPHSABS 1.2 09/19/2019 1100   MONOABS 0.4 09/19/2019 1100   EOSABS 0.4 09/19/2019 1100   BASOSABS 0.1 09/19/2019 1100    BMET    Component Value Date/Time   NA 139 07/25/2019 0440   K 4.5 07/25/2019 0440   CL 105 07/25/2019 0440   CO2 28 07/25/2019 0440   GLUCOSE 130 (H) 07/25/2019 0440   BUN 16 07/25/2019 0440   CREATININE 0.75 07/25/2019 0440   CREATININE 0.85 02/04/2019 0854   CALCIUM 8.5 (L) 07/25/2019 0440   GFRNONAA >60 07/25/2019 0440   GFRNONAA >60 02/04/2019 0854   GFRAA >60 07/25/2019 0440   GFRAA >60 02/04/2019 0854    BNP No results found for: BNP  ProBNP No results found for: PROBNP  Imaging: CT Chest High Resolution  Result Date: 11/05/2019 CLINICAL DATA:  Follow-up interstitial lung disease. Remote smoking history. EXAM: CT CHEST WITHOUT CONTRAST TECHNIQUE: Multidetector CT imaging of the chest was performed following the standard protocol without intravenous contrast. High resolution imaging of the lungs, as well as inspiratory and expiratory imaging, was performed. COMPARISON:  10/18/2018 high-resolution chest CT. FINDINGS: Cardiovascular: Normal heart size. Small pericardial effusion/thickening, slightly increased. Three-vessel coronary atherosclerosis. Atherosclerotic nonaneurysmal thoracic aorta. Normal caliber pulmonary arteries. Mediastinum/Nodes: No discrete thyroid nodules. Unremarkable esophagus. No pathologically enlarged axillary, mediastinal or hilar lymph nodes, noting  limited sensitivity for the detection of hilar adenopathy on this noncontrast study. Lungs/Pleura: No pneumothorax. No pleural effusion. Mild patchy air trapping in both lungs on the expiration sequence is unchanged. No evidence of tracheobronchomalacia. There is moderate patchy confluent subpleural reticulation and ground-glass opacity throughout both lungs with associated mild traction bronchiectasis and architectural distortion. Mild subpleural cystic changes in the upper lobes are favored represent mild paraseptal emphysema. No clear apicobasilar gradient to these findings. Findings appear stable to slightly progressed in the interval. No frank honeycombing. 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 Musculoskeletal: No aggressive appearing focal osseous lesions. Mild thoracic spondylosis. IMPRESSION: 1. 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 Stable mild patchy air trapping in both lungs. 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 Fibrotic NSIP is on the differential. Findings are suggestive of an alternative diagnosis (not UIP) per consensus guidelines: Diagnosis of Idiopathic Pulmonary Fibrosis: An Official ATS/ERS/JRS/ALAT Clinical Practice Guideline. [place], Iss 5, [phone], Nov 12 2016. 2. Three-vessel coronary atherosclerosis. 3. Small pericardial effusion/thickening, slightly increased. 4. Aortic Atherosclerosis (ICD10-I70.0) and Emphysema (ICD10-J43.9). Electronically Signed   By: Ilona Sorrel M.D.   On: 11/05/2019 10:10     Assessment & Plan:   OSA on CPAP -Patient is 100% compliant with CPAP and reports benefit from use -Pressure 10 cm H2O; AHI 2.7 -No changes -Follow-up with Dr. Elsworth Soho annually  Cough variant asthma - No respiratory  complaints, lung function is normal - FU with Dr. Melvyn Novas in 6 months   Pre-operative respiratory examination - Respiratory exam benign. VSS. Medically cleared from pulmonary standpoint for back surgery  Recommend 1. Short duration of surgery as much as possible and avoid paralytic if possible 2. Recovery in step down or ICU with Pulmonary consultation if needed  3. DVT prophylaxis  4. Aggressive pulmonary toilet with o2, bronchodilatation, and incentive spirometry and early ambulation    Martyn Ehrich, NP 11/13/2019

## 2019-11-13 NOTE — Patient Instructions (Signed)
Recommendations:  - Medically cleared from pulmonary standpoint for back surgery - Continue to wear CPAP every night for 4-6 hours or more each night - Bring CPAP without to hospital - Encourage you use Incentive spirometer 10x/hour and ambulate as soon as you are cleared to do so    Follow-up: - 6 months with Dr. Waynard Edwards

## 2019-11-13 NOTE — Telephone Encounter (Signed)
Surgical clearance paperwork was faxed to Dr. Towanda Malkin office after pt's visit. Attempted to call Cydney Ok at [facility] letting her know that this had been done but line went straight to VM. Left her a detailed message letting her know that paperwork had been faxed. Nothing further needed.

## 2019-11-14 ENCOUNTER — Ambulatory Visit: Payer: Medicare Other | Admitting: Primary Care

## 2019-11-14 DIAGNOSIS — Z6828 Body mass index (BMI) 28.0-28.9, adult: Secondary | ICD-10-CM | POA: Diagnosis not present

## 2019-11-14 DIAGNOSIS — M4316 Spondylolisthesis, lumbar region: Secondary | ICD-10-CM | POA: Diagnosis not present

## 2019-11-14 DIAGNOSIS — M47816 Spondylosis without myelopathy or radiculopathy, lumbar region: Secondary | ICD-10-CM | POA: Diagnosis not present

## 2019-11-14 DIAGNOSIS — M48062 Spinal stenosis, lumbar region with neurogenic claudication: Secondary | ICD-10-CM | POA: Diagnosis not present

## 2019-11-16 DIAGNOSIS — Z23 Encounter for immunization: Secondary | ICD-10-CM | POA: Diagnosis not present

## 2019-11-27 DIAGNOSIS — M5416 Radiculopathy, lumbar region: Secondary | ICD-10-CM | POA: Diagnosis not present

## 2019-11-29 DIAGNOSIS — Z01812 Encounter for preprocedural laboratory examination: Secondary | ICD-10-CM | POA: Diagnosis not present

## 2019-11-29 DIAGNOSIS — M48062 Spinal stenosis, lumbar region with neurogenic claudication: Secondary | ICD-10-CM | POA: Diagnosis not present

## 2019-11-29 DIAGNOSIS — M5416 Radiculopathy, lumbar region: Secondary | ICD-10-CM | POA: Diagnosis not present

## 2019-11-29 DIAGNOSIS — Z4689 Encounter for fitting and adjustment of other specified devices: Secondary | ICD-10-CM | POA: Diagnosis not present

## 2019-11-29 DIAGNOSIS — U071 COVID-19: Secondary | ICD-10-CM | POA: Diagnosis not present

## 2019-11-29 DIAGNOSIS — M4316 Spondylolisthesis, lumbar region: Secondary | ICD-10-CM | POA: Diagnosis not present

## 2019-12-03 DIAGNOSIS — E119 Type 2 diabetes mellitus without complications: Secondary | ICD-10-CM | POA: Diagnosis not present

## 2019-12-03 DIAGNOSIS — J439 Emphysema, unspecified: Secondary | ICD-10-CM | POA: Diagnosis not present

## 2019-12-03 DIAGNOSIS — M545 Low back pain: Secondary | ICD-10-CM | POA: Diagnosis not present

## 2019-12-03 DIAGNOSIS — M7138 Other bursal cyst, other site: Secondary | ICD-10-CM | POA: Diagnosis not present

## 2019-12-03 DIAGNOSIS — G4730 Sleep apnea, unspecified: Secondary | ICD-10-CM | POA: Diagnosis not present

## 2019-12-03 DIAGNOSIS — M48061 Spinal stenosis, lumbar region without neurogenic claudication: Secondary | ICD-10-CM | POA: Diagnosis not present

## 2019-12-03 DIAGNOSIS — G8918 Other acute postprocedural pain: Secondary | ICD-10-CM | POA: Diagnosis not present

## 2019-12-03 DIAGNOSIS — Z7984 Long term (current) use of oral hypoglycemic drugs: Secondary | ICD-10-CM | POA: Diagnosis not present

## 2019-12-03 DIAGNOSIS — M4316 Spondylolisthesis, lumbar region: Secondary | ICD-10-CM | POA: Diagnosis not present

## 2019-12-03 DIAGNOSIS — Z981 Arthrodesis status: Secondary | ICD-10-CM | POA: Diagnosis not present

## 2019-12-03 DIAGNOSIS — M4716 Other spondylosis with myelopathy, lumbar region: Secondary | ICD-10-CM | POA: Diagnosis not present

## 2019-12-03 DIAGNOSIS — M5416 Radiculopathy, lumbar region: Secondary | ICD-10-CM | POA: Diagnosis not present

## 2019-12-03 DIAGNOSIS — Z87891 Personal history of nicotine dependence: Secondary | ICD-10-CM | POA: Diagnosis not present

## 2019-12-03 DIAGNOSIS — M4326 Fusion of spine, lumbar region: Secondary | ICD-10-CM | POA: Diagnosis not present

## 2019-12-03 DIAGNOSIS — M961 Postlaminectomy syndrome, not elsewhere classified: Secondary | ICD-10-CM | POA: Diagnosis not present

## 2019-12-03 DIAGNOSIS — M4306 Spondylolysis, lumbar region: Secondary | ICD-10-CM | POA: Diagnosis not present

## 2019-12-03 DIAGNOSIS — M48062 Spinal stenosis, lumbar region with neurogenic claudication: Secondary | ICD-10-CM | POA: Diagnosis not present

## 2019-12-03 DIAGNOSIS — Z9884 Bariatric surgery status: Secondary | ICD-10-CM | POA: Diagnosis not present

## 2019-12-04 DIAGNOSIS — M4306 Spondylolysis, lumbar region: Secondary | ICD-10-CM | POA: Diagnosis not present

## 2019-12-04 DIAGNOSIS — J439 Emphysema, unspecified: Secondary | ICD-10-CM | POA: Diagnosis not present

## 2019-12-04 DIAGNOSIS — M7138 Other bursal cyst, other site: Secondary | ICD-10-CM | POA: Diagnosis not present

## 2019-12-04 DIAGNOSIS — M545 Low back pain: Secondary | ICD-10-CM | POA: Diagnosis not present

## 2019-12-04 DIAGNOSIS — M47816 Spondylosis without myelopathy or radiculopathy, lumbar region: Secondary | ICD-10-CM | POA: Diagnosis not present

## 2019-12-04 DIAGNOSIS — I700 Atherosclerosis of aorta: Secondary | ICD-10-CM | POA: Diagnosis not present

## 2019-12-04 DIAGNOSIS — G4730 Sleep apnea, unspecified: Secondary | ICD-10-CM | POA: Diagnosis not present

## 2019-12-04 DIAGNOSIS — Z981 Arthrodesis status: Secondary | ICD-10-CM | POA: Diagnosis not present

## 2019-12-04 DIAGNOSIS — M48061 Spinal stenosis, lumbar region without neurogenic claudication: Secondary | ICD-10-CM | POA: Diagnosis not present

## 2019-12-27 DIAGNOSIS — Z23 Encounter for immunization: Secondary | ICD-10-CM | POA: Diagnosis not present

## 2019-12-30 DIAGNOSIS — F3130 Bipolar disorder, current episode depressed, mild or moderate severity, unspecified: Secondary | ICD-10-CM | POA: Diagnosis not present

## 2020-01-02 DIAGNOSIS — M47816 Spondylosis without myelopathy or radiculopathy, lumbar region: Secondary | ICD-10-CM | POA: Diagnosis not present

## 2020-01-06 DIAGNOSIS — M48061 Spinal stenosis, lumbar region without neurogenic claudication: Secondary | ICD-10-CM | POA: Diagnosis not present

## 2020-01-06 DIAGNOSIS — M4316 Spondylolisthesis, lumbar region: Secondary | ICD-10-CM | POA: Diagnosis not present

## 2020-01-06 DIAGNOSIS — M4807 Spinal stenosis, lumbosacral region: Secondary | ICD-10-CM | POA: Diagnosis not present

## 2020-01-06 DIAGNOSIS — M5126 Other intervertebral disc displacement, lumbar region: Secondary | ICD-10-CM | POA: Diagnosis not present

## 2020-01-06 DIAGNOSIS — Z981 Arthrodesis status: Secondary | ICD-10-CM | POA: Diagnosis not present

## 2020-01-08 DIAGNOSIS — R609 Edema, unspecified: Secondary | ICD-10-CM | POA: Diagnosis not present

## 2020-01-08 DIAGNOSIS — R600 Localized edema: Secondary | ICD-10-CM | POA: Diagnosis not present

## 2020-01-09 DIAGNOSIS — M4858XA Collapsed vertebra, not elsewhere classified, sacral and sacrococcygeal region, initial encounter for fracture: Secondary | ICD-10-CM | POA: Diagnosis not present

## 2020-01-09 DIAGNOSIS — M7062 Trochanteric bursitis, left hip: Secondary | ICD-10-CM | POA: Diagnosis not present

## 2020-01-09 DIAGNOSIS — M4326 Fusion of spine, lumbar region: Secondary | ICD-10-CM | POA: Diagnosis not present

## 2020-01-09 DIAGNOSIS — M25552 Pain in left hip: Secondary | ICD-10-CM | POA: Diagnosis not present

## 2020-01-09 DIAGNOSIS — Z981 Arthrodesis status: Secondary | ICD-10-CM | POA: Diagnosis not present

## 2020-01-16 ENCOUNTER — Other Ambulatory Visit: Payer: Self-pay

## 2020-01-16 DIAGNOSIS — D508 Other iron deficiency anemias: Secondary | ICD-10-CM

## 2020-01-20 ENCOUNTER — Inpatient Hospital Stay: Payer: Medicare Other | Attending: Oncology

## 2020-01-20 ENCOUNTER — Other Ambulatory Visit: Payer: Self-pay

## 2020-01-20 DIAGNOSIS — Z9884 Bariatric surgery status: Secondary | ICD-10-CM | POA: Diagnosis not present

## 2020-01-20 DIAGNOSIS — D508 Other iron deficiency anemias: Secondary | ICD-10-CM | POA: Insufficient documentation

## 2020-01-20 DIAGNOSIS — K909 Intestinal malabsorption, unspecified: Secondary | ICD-10-CM | POA: Insufficient documentation

## 2020-01-20 LAB — CBC WITH DIFFERENTIAL (CANCER CENTER ONLY)
Abs Immature Granulocytes: 0.02 10*3/uL (ref 0.00–0.07)
Basophils Absolute: 0.1 10*3/uL (ref 0.0–0.1)
Basophils Relative: 1 %
Eosinophils Absolute: 0.3 10*3/uL (ref 0.0–0.5)
Eosinophils Relative: 6 %
HCT: 31.4 % — ABNORMAL LOW (ref 39.0–52.0)
Hemoglobin: 9.8 g/dL — ABNORMAL LOW (ref 13.0–17.0)
Immature Granulocytes: 0 %
Lymphocytes Relative: 24 %
Lymphs Abs: 1.2 10*3/uL (ref 0.7–4.0)
MCH: 30.1 pg (ref 26.0–34.0)
MCHC: 31.2 g/dL (ref 30.0–36.0)
MCV: 96.3 fL (ref 80.0–100.0)
Monocytes Absolute: 0.5 10*3/uL (ref 0.1–1.0)
Monocytes Relative: 9 %
Neutro Abs: 3.1 10*3/uL (ref 1.7–7.7)
Neutrophils Relative %: 60 %
Platelet Count: 212 10*3/uL (ref 150–400)
RBC: 3.26 MIL/uL — ABNORMAL LOW (ref 4.22–5.81)
RDW: 14.2 % (ref 11.5–15.5)
WBC Count: 5.2 10*3/uL (ref 4.0–10.5)
nRBC: 0 % (ref 0.0–0.2)

## 2020-01-20 LAB — IRON AND TIBC
Iron: 64 ug/dL (ref 42–163)
Saturation Ratios: 25 % (ref 20–55)
TIBC: 254 ug/dL (ref 202–409)
UIBC: 189 ug/dL (ref 117–376)

## 2020-01-20 LAB — FERRITIN: Ferritin: 104 ng/mL (ref 24–336)

## 2020-01-22 DIAGNOSIS — R600 Localized edema: Secondary | ICD-10-CM | POA: Diagnosis not present

## 2020-01-29 DIAGNOSIS — E119 Type 2 diabetes mellitus without complications: Secondary | ICD-10-CM | POA: Diagnosis not present

## 2020-01-29 DIAGNOSIS — M25552 Pain in left hip: Secondary | ICD-10-CM | POA: Diagnosis not present

## 2020-01-29 DIAGNOSIS — I872 Venous insufficiency (chronic) (peripheral): Secondary | ICD-10-CM | POA: Diagnosis not present

## 2020-01-29 DIAGNOSIS — Z7984 Long term (current) use of oral hypoglycemic drugs: Secondary | ICD-10-CM | POA: Diagnosis not present

## 2020-01-29 DIAGNOSIS — R600 Localized edema: Secondary | ICD-10-CM | POA: Diagnosis not present

## 2020-01-29 DIAGNOSIS — Z1389 Encounter for screening for other disorder: Secondary | ICD-10-CM | POA: Diagnosis not present

## 2020-01-29 DIAGNOSIS — E1169 Type 2 diabetes mellitus with other specified complication: Secondary | ICD-10-CM | POA: Diagnosis not present

## 2020-01-29 DIAGNOSIS — F3342 Major depressive disorder, recurrent, in full remission: Secondary | ICD-10-CM | POA: Diagnosis not present

## 2020-01-29 DIAGNOSIS — Z0000 Encounter for general adult medical examination without abnormal findings: Secondary | ICD-10-CM | POA: Diagnosis not present

## 2020-01-29 DIAGNOSIS — J849 Interstitial pulmonary disease, unspecified: Secondary | ICD-10-CM | POA: Diagnosis not present

## 2020-01-29 DIAGNOSIS — M545 Low back pain, unspecified: Secondary | ICD-10-CM | POA: Diagnosis not present

## 2020-01-30 DIAGNOSIS — M25552 Pain in left hip: Secondary | ICD-10-CM | POA: Diagnosis not present

## 2020-01-31 DIAGNOSIS — M48062 Spinal stenosis, lumbar region with neurogenic claudication: Secondary | ICD-10-CM | POA: Diagnosis not present

## 2020-01-31 DIAGNOSIS — Z79899 Other long term (current) drug therapy: Secondary | ICD-10-CM | POA: Diagnosis not present

## 2020-01-31 DIAGNOSIS — Z981 Arthrodesis status: Secondary | ICD-10-CM | POA: Diagnosis not present

## 2020-01-31 DIAGNOSIS — G894 Chronic pain syndrome: Secondary | ICD-10-CM | POA: Diagnosis not present

## 2020-01-31 DIAGNOSIS — Z79891 Long term (current) use of opiate analgesic: Secondary | ICD-10-CM | POA: Diagnosis not present

## 2020-01-31 DIAGNOSIS — M4326 Fusion of spine, lumbar region: Secondary | ICD-10-CM | POA: Diagnosis not present

## 2020-02-11 DIAGNOSIS — F3130 Bipolar disorder, current episode depressed, mild or moderate severity, unspecified: Secondary | ICD-10-CM | POA: Diagnosis not present

## 2020-02-13 DIAGNOSIS — M25552 Pain in left hip: Secondary | ICD-10-CM | POA: Diagnosis not present

## 2020-02-13 DIAGNOSIS — M7062 Trochanteric bursitis, left hip: Secondary | ICD-10-CM | POA: Diagnosis not present

## 2020-02-18 DIAGNOSIS — M7062 Trochanteric bursitis, left hip: Secondary | ICD-10-CM | POA: Diagnosis not present

## 2020-02-18 DIAGNOSIS — M25552 Pain in left hip: Secondary | ICD-10-CM | POA: Diagnosis not present

## 2020-02-21 DIAGNOSIS — M25552 Pain in left hip: Secondary | ICD-10-CM | POA: Diagnosis not present

## 2020-02-21 DIAGNOSIS — M7062 Trochanteric bursitis, left hip: Secondary | ICD-10-CM | POA: Diagnosis not present

## 2020-02-24 DIAGNOSIS — M7062 Trochanteric bursitis, left hip: Secondary | ICD-10-CM | POA: Diagnosis not present

## 2020-02-24 DIAGNOSIS — M25552 Pain in left hip: Secondary | ICD-10-CM | POA: Diagnosis not present

## 2020-03-03 DIAGNOSIS — M25552 Pain in left hip: Secondary | ICD-10-CM | POA: Diagnosis not present

## 2020-03-26 DIAGNOSIS — M25559 Pain in unspecified hip: Secondary | ICD-10-CM | POA: Diagnosis not present

## 2020-03-26 DIAGNOSIS — M6281 Muscle weakness (generalized): Secondary | ICD-10-CM | POA: Diagnosis not present

## 2020-03-26 DIAGNOSIS — R262 Difficulty in walking, not elsewhere classified: Secondary | ICD-10-CM | POA: Diagnosis not present

## 2020-03-31 DIAGNOSIS — F3130 Bipolar disorder, current episode depressed, mild or moderate severity, unspecified: Secondary | ICD-10-CM | POA: Diagnosis not present

## 2020-04-02 DIAGNOSIS — R262 Difficulty in walking, not elsewhere classified: Secondary | ICD-10-CM | POA: Diagnosis not present

## 2020-04-02 DIAGNOSIS — M6281 Muscle weakness (generalized): Secondary | ICD-10-CM | POA: Diagnosis not present

## 2020-04-02 DIAGNOSIS — M25559 Pain in unspecified hip: Secondary | ICD-10-CM | POA: Diagnosis not present

## 2020-04-06 DIAGNOSIS — E7800 Pure hypercholesterolemia, unspecified: Secondary | ICD-10-CM | POA: Diagnosis not present

## 2020-04-06 DIAGNOSIS — M6281 Muscle weakness (generalized): Secondary | ICD-10-CM | POA: Diagnosis not present

## 2020-04-06 DIAGNOSIS — R634 Abnormal weight loss: Secondary | ICD-10-CM | POA: Diagnosis not present

## 2020-04-06 DIAGNOSIS — J849 Interstitial pulmonary disease, unspecified: Secondary | ICD-10-CM | POA: Diagnosis not present

## 2020-04-06 DIAGNOSIS — Z7984 Long term (current) use of oral hypoglycemic drugs: Secondary | ICD-10-CM | POA: Diagnosis not present

## 2020-04-06 DIAGNOSIS — E1169 Type 2 diabetes mellitus with other specified complication: Secondary | ICD-10-CM | POA: Diagnosis not present

## 2020-04-06 DIAGNOSIS — I700 Atherosclerosis of aorta: Secondary | ICD-10-CM | POA: Diagnosis not present

## 2020-04-06 DIAGNOSIS — Z79899 Other long term (current) drug therapy: Secondary | ICD-10-CM | POA: Diagnosis not present

## 2020-04-06 DIAGNOSIS — M25559 Pain in unspecified hip: Secondary | ICD-10-CM | POA: Diagnosis not present

## 2020-04-06 DIAGNOSIS — R262 Difficulty in walking, not elsewhere classified: Secondary | ICD-10-CM | POA: Diagnosis not present

## 2020-04-07 DIAGNOSIS — M25552 Pain in left hip: Secondary | ICD-10-CM | POA: Diagnosis not present

## 2020-04-08 DIAGNOSIS — M6281 Muscle weakness (generalized): Secondary | ICD-10-CM | POA: Diagnosis not present

## 2020-04-08 DIAGNOSIS — M25559 Pain in unspecified hip: Secondary | ICD-10-CM | POA: Diagnosis not present

## 2020-04-08 DIAGNOSIS — R262 Difficulty in walking, not elsewhere classified: Secondary | ICD-10-CM | POA: Diagnosis not present

## 2020-04-20 DIAGNOSIS — Z01812 Encounter for preprocedural laboratory examination: Secondary | ICD-10-CM | POA: Diagnosis not present

## 2020-04-23 DIAGNOSIS — Z86010 Personal history of colonic polyps: Secondary | ICD-10-CM | POA: Diagnosis not present

## 2020-04-23 DIAGNOSIS — K640 First degree hemorrhoids: Secondary | ICD-10-CM | POA: Diagnosis not present

## 2020-04-30 DIAGNOSIS — M4326 Fusion of spine, lumbar region: Secondary | ICD-10-CM | POA: Diagnosis not present

## 2020-04-30 DIAGNOSIS — Z6828 Body mass index (BMI) 28.0-28.9, adult: Secondary | ICD-10-CM | POA: Diagnosis not present

## 2020-04-30 DIAGNOSIS — M25552 Pain in left hip: Secondary | ICD-10-CM | POA: Diagnosis not present

## 2020-05-12 ENCOUNTER — Other Ambulatory Visit: Payer: Self-pay

## 2020-05-12 ENCOUNTER — Encounter: Payer: Self-pay | Admitting: Internal Medicine

## 2020-05-12 ENCOUNTER — Ambulatory Visit (INDEPENDENT_AMBULATORY_CARE_PROVIDER_SITE_OTHER): Payer: Medicare Other | Admitting: Internal Medicine

## 2020-05-12 DIAGNOSIS — R058 Other specified cough: Secondary | ICD-10-CM | POA: Diagnosis not present

## 2020-05-12 DIAGNOSIS — J45991 Cough variant asthma: Secondary | ICD-10-CM | POA: Diagnosis not present

## 2020-05-12 DIAGNOSIS — G4733 Obstructive sleep apnea (adult) (pediatric): Secondary | ICD-10-CM | POA: Diagnosis not present

## 2020-05-12 DIAGNOSIS — Z9989 Dependence on other enabling machines and devices: Secondary | ICD-10-CM | POA: Diagnosis not present

## 2020-05-12 DIAGNOSIS — R918 Other nonspecific abnormal finding of lung field: Secondary | ICD-10-CM

## 2020-05-12 MED ORDER — PREDNISONE 10 MG PO TABS
ORAL_TABLET | ORAL | 0 refills | Status: DC
Start: 2020-05-12 — End: 2020-06-08

## 2020-05-12 NOTE — Assessment & Plan Note (Signed)
Dxed 1996 and fu By Dr Elsworth Soho   08/03/1995 -sleep study: RDI - 50 Titration study 2008 with optimal pressure 14cm. S/p bariatric surgery.  Auto 11/2012: optimal pressure 10cm - 11/30/2015  Using > 4h 100% with autoset 7 and ahi 3.6  Referred back to Dr Elsworth Soho

## 2020-05-12 NOTE — Patient Instructions (Addendum)
If worse or before allergy eval > Prednisone 10 mg take  4 each am x 2 days,   2 each am x 2 days,  1 each am x 2 days and stop   You need to see [name] for follow up on cpap next available in [place]   I will be referring you to allergy clinic in [place] to re-establish   Pulmonary follow up is as needed

## 2020-05-12 NOTE — Progress Notes (Signed)
Subjective:    Patient ID: [name], male    DOB: [birth year]   MRN: [id number]    Brief patient profile:  75  yowm quit smoking 1993 with h/o tree allergies age 75 produced seasonal sob and chest tight worse in spring but not requiring maint rx and then onset of same problems starting around 2010 around tgiving and worse symptoms year round since  2013   So self referred  to pulmonary clinic 09/27/2012 for eval of ? Asthma/copd.     History of Present Illness  09/27/2012 1st pulmonary eval / cc  Recurrent chest tightness and sob x 4 years seasonally does fine on cpap x 10 years [place].  Maintained on inderol 40 mg qid and multiple inhalers including laba/lama > no better.  Albuterol helps 10% .  Prednisone helps a lot.  Assoc with mostly dry cough day > night.   Sob is variably present at rest and with activity, much worse when anxious, much worse last 4 weeks. symbicort 80 Take 2 puffs first thing in am and then another 2 puffs about 12 hours later.  Increase prilosec to 20 mg 2 Take 30-60 min before first meal of the day  Wean off the inderal  For cough use delsym 2 tsp every 12 hours and supplement with tramadol 50 mg every 4 hours if needed  GERD diet/   10/11/2012 f/u ov/Wert re cough Chief Complaint  Patient presents with  . Follow-up    Pt states that overall his breathing is much improved since the last visit. He is still clearing his throat, but this is some less since the last visit.   sob completely resolved, not limited by doe from desired activities  rec Work on inhaler technique:   Only use  symbicort 80 2 every 12 hours if you feel you need it for any respiratory flare For throat congestion/ cough try chlortrimeton 4 mg at bedtime and every 6 hours as needed      Sinus CT 11/23/12 > severe sinusitis > rx x 21 days augmentin   12/20/2012 f/u ov/Wert re: chronic cough/ ? All sinus related?  Chief Complaint  Patient presents with  . Follow-up    still  feels like can't take a deep breath,had ct sinus 2 wks. ago,feels pr. in sinus ,pnd,cough-dry occass,feels like something in throat,sob slightly better,occass. wheezing,mid chest tightness occass.,wants flu shot if ok  rec Please see patient coordinator before you leave today  to schedule ent eval> [facility] declined surgery  Once your cough and breathing have normalized you can try tapering off the symbicort 80 but restart immediately if worsen off it> stopped it and made no difference   11/23/2016  Extended f/u ov/Wert re: uacs/ chronic rhinitis no maint rx other than flonase  Chief Complaint  Patient presents with  . Follow-up    nasal congestion x years/sporadic  March and April ar the worst time for nasal congestion/assoc wheeze  but also peaks in June and late August x decades Assoc chest tightness resolves with xanax and or  mucinex but does correlate with nasal congestion Better 50% mucinex Better p 75% albuterol last used 6 m prior to OV   rec Nasal steroids (flonase)  have no immediate benefit in terms of improving symptoms.     02/18/2019  f/u ov/Wert re: chronic rhinitis/sinusitis  Chief Complaint  Patient presents with  . Follow-up    last seen by MW on 11/2016.  PFT on 11/25, in  [place].    Dyspnea:  MMRC1 = can walk nl pace, flat grade, can't hurry or go uphills or steps s sob  / limited by knees hips esp steps  Cough: none  Sleeping: none on cpap / 30 degrees  SABA use: none  02: none  Xanax helps the breathing  rec Your lung function is still normal  If your breathing continues to bother you I would consider another ENT opinion but defer this to Dr Felipa Eth  Pulmonary follow up is as needed  - contact Dr Elsworth Soho for any issues related to CPAP machine    05/12/2020  f/u ov/Wert re: chronic rhinitis/ cough with tree pollen / on benadryl maint rx  Chief Complaint  Patient presents with  . Follow-up    Breathing is overall doing well. He has been having some sinus  pressure/congestion that he relates to tree allergy. He uses his albuterol inhaler 1 x per wk on average.   Dyspnea:  Assoc with nasal congestion/ better with saline lavage  Cough: minimal  Sleeping: electric bed / on cpap per [place]  SABA use: min use  02: none  Covid status:   vax x 3    No obvious day to day or daytime variability or assoc excess/ purulent sputum or mucus plugs or hemoptysis or cp or chest tightness, subjective wheeze or overt   hb symptoms.   Sleeping  without nocturnal  or early am exacerbation  of respiratory  c/o's or need for noct saba. Also denies any obvious fluctuation of symptoms with weather or environmental changes or other aggravating or alleviating factors except as outlined above   No unusual exposure hx or h/o childhood pna/ asthma or knowledge of premature birth.  Current Allergies, Complete Past Medical History, Past Surgical History, Family History, and Social History were reviewed in [employer] record.  ROS  The following are not active complaints unless bolded Hoarseness, sore throat, dysphagia, dental problems, itching, sneezing,  nasal congestion or discharge of excess mucus or purulent secretions, ear ache,   fever, chills, sweats, unintended wt loss or wt gain, classically pleuritic or exertional cp,  orthopnea pnd or arm/hand swelling  or leg swelling, presyncope, palpitations, abdominal pain, anorexia, nausea, vomiting, diarrhea  or change in bowel habits or change in bladder habits, change in stools or change in urine, dysuria, hematuria,  rash, arthralgias, visual complaints, headache, numbness, weakness or ataxia or problems with walking or coordination,  change in mood or  memory.        Current Meds  Medication Sig  . acetaminophen (TYLENOL) 500 MG tablet Take 1,000 mg by mouth daily as needed for headache.  . albuterol (VENTOLIN HFA) 108 (90 Base) MCG/ACT inhaler TAKE 2 PUFFS BY MOUTH EVERY 6 HOURS AS NEEDED FOR WHEEZE  OR SHORTNESS OF BREATH  . aspirin EC 81 MG tablet Take 81 mg by mouth daily as needed for mild pain.  . clonazePAM (KLONOPIN) 0.5 MG tablet Take 0.5 mg by mouth daily.   Marland Kitchen dutasteride (AVODART) 0.5 MG capsule Take 0.5 mg by mouth daily.  . finasteride (PROSCAR) 5 MG tablet Take 1 tablet by mouth daily.  . fluticasone (FLONASE) 50 MCG/ACT nasal spray Place 2 sprays into the nose 2 (two) times daily.  Marland Kitchen gabapentin (NEURONTIN) 300 MG capsule Take 1 capsule by mouth at bedtime. Pt takes 1 capsule in morning and 1 capsule by mouth at bedtime.  Marland Kitchen HYDROcodone-acetaminophen (NORCO) 10-325 MG per tablet Take 1-2 tablets by mouth  every 6 (six) hours as needed for moderate pain. (Patient taking differently: Take 1 tablet by mouth in the morning, at noon, and at bedtime.)  . metFORMIN (GLUCOPHAGE) 500 MG tablet Take 500 mg by mouth 2 (two) times daily with a meal.   . methocarbamol (ROBAXIN) 500 MG tablet Take 1,000 mg by mouth at bedtime.   . midodrine (PROAMATINE) 10 MG tablet Take 10 mg by mouth 3 (three) times daily.  . Multiple Vitamins-Minerals (CENTRUM SILVER ADULT 50+ PO) Take 1 tablet by mouth daily.  . pravastatin (PRAVACHOL) 40 MG tablet Take 40 mg by mouth daily.  . tamsulosin (FLOMAX) 0.4 MG CAPS capsule Take 0.4 mg by mouth daily.  Marland Kitchen UNABLE TO FIND Med Name: CPAP 10 cm                    Objective:   Physical Exam       05/12/2020         199 02/18/2019       213 11/23/2016       254  12/20/2012       266  11/22/2012       259  10/11/2012       254     09/27/12 249 lb (112.946 kg)  04/22/11 257 lb 15 oz (117 kg)  04/22/11 257 lb 15 oz (117 kg)     Vital signs reviewed  05/12/2020  - Note at rest 02 sats  100% on RA   General appearance:    Well preserved pleasant amb wm nad     HEENT : pt wearing mask not removed for exam due to covid -19 concerns.    NECK :  without JVD/Nodes/TM/ nl carotid upstrokes bilaterally   LUNGS: no acc muscle use,  Nl contour chest which is  clear to A and P bilaterally without cough on insp or exp maneuvers   CV:  RRR  no s3 or murmur or increase in P2, and no edema   ABD:  soft and nontender with nl inspiratory excursion in the supine position. No bruits or organomegaly appreciated, bowel sounds nl  MS:  Nl gait/ ext warm without deformities, calf tenderness, cyanosis or clubbing No obvious joint restrictions   SKIN: warm and dry without lesions    NEURO:  alert, approp, nl sensorium with  no motor or cerebellar deficits apparent.        I personally reviewed images and agree with radiology impression as follows:   Chest HRCT 11/05/19  Spectrum of findings compatible with fibrotic interstitial lung disease without frank honeycombing and without clear apicobasilar gradient, stable to slightly progressed in the interval. Stable mild patchy air trapping in both lungs. 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 Fibrotic NSIP is on the Differential                    Assessment & Plan:

## 2020-05-12 NOTE — Assessment & Plan Note (Addendum)
Onset 2014  FENO 11/23/2016  =   40 off all rx - Spirometry 11/23/2016  FEV1 4.06 (106%)  Ratio 82 with min curvature  Off all rx  02/06/2019-pulmonary function test-FVC 4.95 (100% predicted), postbronchodilator ratio 87, postbronchodilator FEV1 4.34 (120% predicted), no positive bronchodilator response, DLCO 23.86 (84% predicted), normal flow volume loops  No def asthma here though he is at risk  For now just use saba prn - rule of 2's reviewed, f/u can be prn

## 2020-05-12 NOTE — Assessment & Plan Note (Addendum)
10/18/2018-HRCT Chest - mild   pulmonary fibrosis in a pattern with apical to basilar gradient features, mild tubular bronchiectasis, minimal subpleural bronchiolectasis ptosis, peripheral interstitial opacity and extensive associated groundglass, findings  Considerable differential generation should be NSIP.,  Mild enlargement of main pulmonary artery - Chest HRCT 11/05/19  Spectrum of findings compatible with fibrotic interstitial lung disease without frank honeycombing and without clear apicobasilar gradient, stable to slightly progressed in the interval. Stable mild patchy air trapping in both lungs. 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 Fibrotic NSIP is on the Differential  Will need f/u ov with walking sats / collagen vasc profile/ HSP serology 10/2020 - placed in reminder file           Each maintenance medication was reviewed in detail including emphasizing most importantly the difference between maintenance and prns and under what circumstances the prns are to be triggered using an action plan format where appropriate.  Total time for H and P, chart review, counseling, reviewing hfa device(s) and generating customized AVS unique to this office visit / same day charting = 33 min

## 2020-05-12 NOTE — Assessment & Plan Note (Signed)
Year round symptoms since 2013  -Sinus CT 11/23/12 Nearly pansinus paranasal sinus inflammatory changes with fluid levels and bubbly opacity most compatible with acute sinusitis. - [facility]  eval 2014 rec surgery > declined - Allergy profile 11/23/2016 >  Eos 0.4 /  IgE  29  RAST neg - 05/12/2020 referred back to allergy/ [place]   Lungs remain clear off saba despite ongoing rhinitis issues > referred back to allergy

## 2020-05-15 ENCOUNTER — Other Ambulatory Visit: Payer: Self-pay

## 2020-05-15 DIAGNOSIS — D508 Other iron deficiency anemias: Secondary | ICD-10-CM

## 2020-05-18 NOTE — Progress Notes (Signed)
[name]  Telephone:([phone]) [phone] [phone]     ID: [name]. DOB: 1945/12/12  MR#: [id number]  EPP#:[id number]  Patient Care Team: Ronald Manes, MD as PCP - General (Internal Medicine) [name], Ronald Dad, MD as Consulting Physician (Oncology) Ronald Cruel, MD OTHER MD:  CHIEF COMPLAINT: iron deficiency  CURRENT TREATMENT: oral iron   INTERVAL HISTORY: [name] returns today for follow up of his iron deficiency.  The interval history is significant for an episode of sepsis which required hospitalization.  After that he tells me he lost quite a bit of weight and it was all muscle weight.  He did not lose any fat weight he says.  He wonders if he needs to see an endocrinologist because of this.  On the plus side he says his diabetes is much better controlled since he started losing the weight loss.  He does get some stomach irritation and constipation from the oral iron which he takes only 3 or 4 times a week.   REVIEW OF SYSTEMS: [name] exercises currently chiefly by walking and he does walk about 3 miles most days.  He is not doing any upper body exercises.  He has a little bit of the left foot drop since he had his back surgery under Ronald. Patrice Gillespie.  He tells me there are no gyms that he trusts (regarding Covid) in [place] but he does have some equipment at home which he is not using.  Aside from these issues a detailed review of systems today was stable.   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 He went from 350 to 222 pounds, then increased back up to about 245-250 and stayed there since 2014.  He notes that since 2014 he has been told that he has anemia and iron deficiency.  He has been taking iron supplementation which has been constipating for him, particularly since he is taking  Hydrocodone for his knee, hip, and shoulder pain.  He underwent colonoscopy in 2016 and FOBT last in 2019 with no evidence of cancer.    [name] notes since February or March of this year he has lost about 40 pounds.  He says that he underwent his upper teeth dental extractions and had swollen gums that took 10-12 weeks to heal to make denture implants.  The dentures he received in September of this year.  During the time of swollen gums, he ate minimal food, mainly soft, and just enough to satiate his appetite.    The patient's subsequent history is as detailed below.   PAST MEDICAL HISTORY: Past Medical History:  Diagnosis Date  . Arthritis   . Asthma    anxiety, tree/grass pollen  . BPH (benign prostatic hyperplasia)   . Bronchitis    hx of  . Bulge of cervical disc without myelopathy   . Chest pain    a. 06/2015 MV: EF 56%, no ischemia/infarct.  . Complication of anesthesia 08/17/28   no complications but just says he typically needs "more than expected" to anesthetize; needs CPAP (setting 10) in recovery  . Depression    ADD  . Diabetes mellitus    PCP [name], type 2  . Insomnia   . Low blood pressure   . Mitral valve prolapse    a. 06/2015 Echo: EF 65-70%, mild LVH, no rwma, mild MVP  of ant leaflet and mild to mod posteriorly directed MR, mildly dil La.  . Obesity   . PVC's (premature ventricular contractions)   . Recurrent upper respiratory infection (URI)    states [name] aware- no fever- states is improving  . Sleep apnea    settings 10.6  / followed by [name]  . Tremor    d/t lithium    PAST SURGICAL HISTORY: Past Surgical History:  Procedure Laterality Date  . CARDIAC CATHETERIZATION     2008 pre op gastric bypass  . COLONOSCOPY W/ POLYPECTOMY    . 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 JOINT REPLACEMENT     Left, Right Knee, Right hip  . KNEE ARTHROSCOPY     right x 3-4, left x 2  . KNEE ARTHROTOMY  ~1991   right  . LUMBAR FUSION   04/03/2013   [name], L5-S1  . POLYPECTOMY     vocal cords 1992  . 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   Procedure: LEFT TOTAL HIP ARTHROPLASTY ANTERIOR APPROACH;  Surgeon: Ronald Cancel, MD;  Location: [facility];  Service: Orthopedics;  Laterality: Left;  . TOTAL HIP REVISION  04/22/2011   Procedure: TOTAL HIP REVISION;  Surgeon: Ronald Alf, MD;  Location: [facility];  Service: Orthopedics;  Laterality: Right;    FAMILY HISTORY Family History  Problem Relation Age of Onset  . Heart disease Father   . Anxiety disorder Mother   . Asthma Maternal Grandmother     SOCIAL HISTORY:  Jemarcus is married and lives with his wife in [place], [place].  His sister in law visits from time to time.  He is retired, as of 07/2012.  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      ADVANCED DIRECTIVES: Not in place, planning to complete.     HEALTH MAINTENANCE: Social History   Tobacco Use  . Smoking status: Former Smoker    Packs/day: 1.50    Years: 20.00    Pack years: 30.00    Types: Cigarettes    Quit date: 04/17/1990    Years since quitting: 30.1  . Smokeless tobacco: Never Used  Vaping Use  . Vaping Use: Never used  Substance Use Topics  . Alcohol use: No  . Drug use: No     Colonoscopy: May 2016/schooler  PSA:  Bone density:   Allergies  Allergen Reactions  . Breo Ellipta [Fluticasone Furoate-Vilanterol] Other (See Comments)    Thrush  . Demerol [Meperidine] Itching  . Lamotrigine Hives and Itching  . Nsaids Nausea Only    Gastric bypass    Current Outpatient Medications  Medication Sig Dispense Refill  . acetaminophen (TYLENOL) 500 MG tablet Take 1,000 mg by mouth daily as needed for headache.    . albuterol (VENTOLIN HFA) 108 (90 Base) MCG/ACT inhaler TAKE 2 PUFFS BY MOUTH EVERY 6 HOURS AS NEEDED FOR WHEEZE OR SHORTNESS OF BREATH 4 g 3  . aspirin EC 81 MG tablet Take 81 mg by mouth daily as needed for mild pain.    . clonazePAM (KLONOPIN) 0.5  MG tablet Take 0.5 mg by mouth daily.     Marland Kitchen dutasteride (AVODART) 0.5 MG capsule Take 0.5 mg by mouth daily.    . finasteride (PROSCAR) 5 MG tablet Take 1 tablet by mouth daily.    . fluticasone (FLONASE) 50 MCG/ACT nasal spray Place 2 sprays into the nose 2 (two) times daily.    Marland Kitchen  gabapentin (NEURONTIN) 300 MG capsule Take 1 capsule by mouth at bedtime. Pt takes 1 capsule in morning and 1 capsule by mouth at bedtime.    Marland Kitchen HYDROcodone-acetaminophen (NORCO) 10-325 MG per tablet Take 1-2 tablets by mouth every 6 (six) hours as needed for moderate pain. (Patient taking differently: Take 1 tablet by mouth in the morning, at noon, and at bedtime.) 90 tablet 0  . metFORMIN (GLUCOPHAGE) 500 MG tablet Take 500 mg by mouth 2 (two) times daily with a meal.     . methocarbamol (ROBAXIN) 500 MG tablet Take 1,000 mg by mouth at bedtime.     . midodrine (PROAMATINE) 10 MG tablet Take 10 mg by mouth 3 (three) times daily.    . Multiple Vitamins-Minerals (CENTRUM SILVER ADULT 50+ PO) Take 1 tablet by mouth daily.    . pravastatin (PRAVACHOL) 40 MG tablet Take 40 mg by mouth daily.    . predniSONE (DELTASONE) 10 MG tablet Take  4 each am x 2 days,   2 each am x 2 days,  1 each am x 2 days and stop 14 tablet 0  . tamsulosin (FLOMAX) 0.4 MG CAPS capsule Take 0.4 mg by mouth daily.    Marland Kitchen UNABLE TO FIND Med Name: CPAP 10 cm     No current facility-administered medications for this visit.    OBJECTIVE: White man who appears stated age  9:   05/19/20 1145  BP: 132/78  Pulse: 85  Resp: 18  Temp: (!) 97.5 F (36.4 C)  SpO2: 97%     Body mass index is 25.77 kg/m.   Wt Readings from Last 3 Encounters:  05/19/20 200 lb 11.2 oz (91 kg)  05/12/20 199 lb 9.6 oz (90.5 kg)  11/13/19 215 lb (97.5 kg)  ECOG FS:1 - Symptomatic but completely ambulatory  Sclerae unicteric, EOMs intact Wearing a mask No cervical or supraclavicular adenopathy Lungs no rales or rhonchi Heart regular rate and rhythm Abd soft,  nontender, positive bowel sounds MSK no focal spinal tenderness, no upper extremity lymphedema Neuro: nonfocal, well oriented, appropriate affect  LAB RESULTS:  CMP     Component Value Date/Time   NA 141 05/19/2020 1129   K 4.1 05/19/2020 1129   CL 107 05/19/2020 1129   CO2 28 05/19/2020 1129   GLUCOSE 115 (H) 05/19/2020 1129   BUN 17 05/19/2020 1129   CREATININE 0.72 05/19/2020 1129   CALCIUM 8.9 05/19/2020 1129   PROT 6.8 05/19/2020 1129   ALBUMIN 3.7 05/19/2020 1129   AST 36 05/19/2020 1129   ALT 89 (H) 05/19/2020 1129   ALKPHOS 67 05/19/2020 1129   BILITOT 0.3 05/19/2020 1129   GFRNONAA >60 05/19/2020 1129   GFRAA >60 07/25/2019 0440   GFRAA >60 02/04/2019 0854    No results found for: TOTALPROTELP, ALBUMINELP, A1GS, A2GS, BETS, BETA2SER, GAMS, MSPIKE, SPEI  No results found for: Nils Pyle, [facility]  Lab Results  Component Value Date   WBC 8.0 05/19/2020   NEUTROABS 5.3 05/19/2020   HGB 12.6 (L) 05/19/2020   HCT 36.9 (L) 05/19/2020   MCV 95.1 05/19/2020   PLT 242 05/19/2020      Chemistry      Component Value Date/Time   NA 141 05/19/2020 1129   K 4.1 05/19/2020 1129   CL 107 05/19/2020 1129   CO2 28 05/19/2020 1129   BUN 17 05/19/2020 1129   CREATININE 0.72 05/19/2020 1129      Component Value Date/Time   CALCIUM 8.9 05/19/2020 1129  ALKPHOS 67 05/19/2020 1129   AST 36 05/19/2020 1129   ALT 89 (H) 05/19/2020 1129   BILITOT 0.3 05/19/2020 1129      No results found for: LABCA2  No components found for: IRJJOA416  No results for input(s): INR in the last 168 hours.  No results found for: LABCA2  No results found for: SAY301  No results found for: SWF093  No results found for: ATF573  No results found for: CA2729  No components found for: HGQUANT  No results found for: CEA1 / No results found for: CEA1   No results found for: AFPTUMOR  No results found for: CHROMOGRNA  No results found for: PSA1  No results  found for: HGBA, HGBA2QUANT, HGBFQUANT, HGBSQUAN (Hemoglobinopathy evaluation)   Lab Results  Component Value Date   LDH 277 (H) 02/04/2019    Lab Results  Component Value Date   IRON 64 01/20/2020   TIBC 254 01/20/2020   IRONPCTSAT 25 01/20/2020   (Iron and TIBC)  Lab Results  Component Value Date   FERRITIN 104 01/20/2020    Urinalysis    Component Value Date/Time   COLORURINE YELLOW 07/23/2019 0623   APPEARANCEUR HAZY (A) 07/23/2019 0623   LABSPEC 1.012 07/23/2019 0623   PHURINE 6.0 07/23/2019 0623   GLUCOSEU NEGATIVE 07/23/2019 0623   HGBUR NEGATIVE 07/23/2019 0623   BILIRUBINUR NEGATIVE 07/23/2019 0623   KETONESUR NEGATIVE 07/23/2019 0623   PROTEINUR NEGATIVE 07/23/2019 0623   UROBILINOGEN 0.2 10/14/2014 0944   NITRITE NEGATIVE 07/23/2019 0623   LEUKOCYTESUR MODERATE (A) 07/23/2019 0623    STUDIES: No results found.   ASSESSMENT: 75 y.o. [place] man, with malabsorptive iron deficiency  1. S/p gastric bypass in 2008  2. Status post Feraheme x 2 on 02/08/2019 and 02/15/2019  (a) continues on ferrous sulfate daily as tolerated  PLAN: [name] has a hemoglobin greater than 12, which I think is an achievement considering most patients with diabetes struggle to get a hemoglobin slightly above 11.  There is no evidence of iron deficiency at this point and he is tolerating oral iron supplementation moderately well.  I reassured him that I do not think he has a major endocrine disorder.  I think if he goes on a low-carb and high-protein diet and does some more upper body exercises he can regain his muscle weight.  He will discuss this further with his primary care physician Ronald. Felipa Eth.  We are going to check labs again in 6 months and 12 months and we will follow him again at the 1 year visit  He knows to call for any other issue that may develop before then  Total encounter time 20 minutes.Sarajane Jews C. Kaedence Connelly, MD  Medical Oncology and  Hematology [facility] [place], [place] [address] Tel. [phone]    Fax. [phone]   I, Wilburn Mylar, am acting as scribe for Ronald. Virgie Gillespie. Aviraj Kentner.  I, Lurline Del MD, have reviewed the above documentation for accuracy and completeness, and I agree with the above.   *Total Encounter Time as defined by the Centers for Medicare and Medicaid Services includes, in addition to the face-to-face time of a patient visit (documented in the note above) non-face-to-face time: obtaining and reviewing outside history, ordering and reviewing medications, tests or procedures, care coordination (communications with other health care professionals or caregivers) and documentation in the medical record.

## 2020-05-19 ENCOUNTER — Telehealth: Payer: Self-pay | Admitting: Hematology and Oncology

## 2020-05-19 ENCOUNTER — Inpatient Hospital Stay: Payer: Medicare Other | Attending: Oncology

## 2020-05-19 ENCOUNTER — Inpatient Hospital Stay (HOSPITAL_BASED_OUTPATIENT_CLINIC_OR_DEPARTMENT_OTHER): Payer: Medicare Other | Admitting: Oncology

## 2020-05-19 ENCOUNTER — Other Ambulatory Visit: Payer: Self-pay

## 2020-05-19 VITALS — BP 132/78 | HR 85 | Temp 97.5°F | Resp 18 | Ht 74.0 in | Wt 200.7 lb

## 2020-05-19 DIAGNOSIS — Z87891 Personal history of nicotine dependence: Secondary | ICD-10-CM | POA: Diagnosis not present

## 2020-05-19 DIAGNOSIS — K9089 Other intestinal malabsorption: Secondary | ICD-10-CM | POA: Diagnosis not present

## 2020-05-19 DIAGNOSIS — D508 Other iron deficiency anemias: Secondary | ICD-10-CM | POA: Insufficient documentation

## 2020-05-19 DIAGNOSIS — F419 Anxiety disorder, unspecified: Secondary | ICD-10-CM | POA: Insufficient documentation

## 2020-05-19 DIAGNOSIS — N400 Enlarged prostate without lower urinary tract symptoms: Secondary | ICD-10-CM | POA: Insufficient documentation

## 2020-05-19 DIAGNOSIS — Z9884 Bariatric surgery status: Secondary | ICD-10-CM | POA: Insufficient documentation

## 2020-05-19 DIAGNOSIS — Z7984 Long term (current) use of oral hypoglycemic drugs: Secondary | ICD-10-CM | POA: Insufficient documentation

## 2020-05-19 DIAGNOSIS — Z79899 Other long term (current) drug therapy: Secondary | ICD-10-CM | POA: Diagnosis not present

## 2020-05-19 DIAGNOSIS — Z7982 Long term (current) use of aspirin: Secondary | ICD-10-CM | POA: Insufficient documentation

## 2020-05-19 DIAGNOSIS — D500 Iron deficiency anemia secondary to blood loss (chronic): Secondary | ICD-10-CM

## 2020-05-19 DIAGNOSIS — E119 Type 2 diabetes mellitus without complications: Secondary | ICD-10-CM | POA: Insufficient documentation

## 2020-05-19 DIAGNOSIS — G4730 Sleep apnea, unspecified: Secondary | ICD-10-CM | POA: Insufficient documentation

## 2020-05-19 LAB — CMP (CANCER CENTER ONLY)
ALT: 89 U/L — ABNORMAL HIGH (ref 0–44)
AST: 36 U/L (ref 15–41)
Albumin: 3.7 g/dL (ref 3.5–5.0)
Alkaline Phosphatase: 67 U/L (ref 38–126)
Anion gap: 6 (ref 5–15)
BUN: 17 mg/dL (ref 8–23)
CO2: 28 mmol/L (ref 22–32)
Calcium: 8.9 mg/dL (ref 8.9–10.3)
Chloride: 107 mmol/L (ref 98–111)
Creatinine: 0.72 mg/dL (ref 0.61–1.24)
GFR, Estimated: >60 mL/min (ref >60)
Glucose, Bld: 115 mg/dL — ABNORMAL HIGH (ref 70–99)
Potassium: 4.1 mmol/L (ref 3.5–5.1)
Sodium: 141 mmol/L (ref 135–145)
Total Bilirubin: 0.3 mg/dL (ref 0.3–1.2)
Total Protein: 6.8 g/dL (ref 6.5–8.1)

## 2020-05-19 LAB — CBC WITH DIFFERENTIAL (CANCER CENTER ONLY)
Abs Immature Granulocytes: 0.03 10*3/uL (ref 0.00–0.07)
Basophils Absolute: 0.1 10*3/uL (ref 0.0–0.1)
Basophils Relative: 1 %
Eosinophils Absolute: 0.3 10*3/uL (ref 0.0–0.5)
Eosinophils Relative: 4 %
HCT: 36.9 % — ABNORMAL LOW (ref 39.0–52.0)
Hemoglobin: 12.6 g/dL — ABNORMAL LOW (ref 13.0–17.0)
Immature Granulocytes: 0 %
Lymphocytes Relative: 22 %
Lymphs Abs: 1.8 10*3/uL (ref 0.7–4.0)
MCH: 32.5 pg (ref 26.0–34.0)
MCHC: 34.1 g/dL (ref 30.0–36.0)
MCV: 95.1 fL (ref 80.0–100.0)
Monocytes Absolute: 0.6 10*3/uL (ref 0.1–1.0)
Monocytes Relative: 8 %
Neutro Abs: 5.3 10*3/uL (ref 1.7–7.7)
Neutrophils Relative %: 65 %
Platelet Count: 242 10*3/uL (ref 150–400)
RBC: 3.88 MIL/uL — ABNORMAL LOW (ref 4.22–5.81)
RDW: 12.8 % (ref 11.5–15.5)
WBC Count: 8 10*3/uL (ref 4.0–10.5)
nRBC: 0 % (ref 0.0–0.2)

## 2020-05-19 NOTE — Telephone Encounter (Signed)
Scheduled appts per 3/8 sch msg. Gave pt a print out of AVS.

## 2020-06-04 DIAGNOSIS — L03012 Cellulitis of left finger: Secondary | ICD-10-CM | POA: Diagnosis not present

## 2020-06-04 DIAGNOSIS — E7800 Pure hypercholesterolemia, unspecified: Secondary | ICD-10-CM | POA: Diagnosis not present

## 2020-06-04 DIAGNOSIS — E1169 Type 2 diabetes mellitus with other specified complication: Secondary | ICD-10-CM | POA: Diagnosis not present

## 2020-06-08 ENCOUNTER — Encounter: Payer: Self-pay | Admitting: Pulmonary Disease

## 2020-06-08 ENCOUNTER — Ambulatory Visit (INDEPENDENT_AMBULATORY_CARE_PROVIDER_SITE_OTHER): Payer: Medicare Other | Admitting: Pulmonary Disease

## 2020-06-08 ENCOUNTER — Other Ambulatory Visit: Payer: Self-pay

## 2020-06-08 VITALS — BP 118/72 | HR 77 | Temp 98.0°F | Ht 74.0 in | Wt 214.0 lb

## 2020-06-08 DIAGNOSIS — J849 Interstitial pulmonary disease, unspecified: Secondary | ICD-10-CM | POA: Diagnosis not present

## 2020-06-08 DIAGNOSIS — Z9989 Dependence on other enabling machines and devices: Secondary | ICD-10-CM | POA: Diagnosis not present

## 2020-06-08 DIAGNOSIS — G4733 Obstructive sleep apnea (adult) (pediatric): Secondary | ICD-10-CM

## 2020-06-08 NOTE — Patient Instructions (Signed)
Schedule HST Based on this, we decide how aggressively to use CPAP Options for leak include - chin strap OR full face mask

## 2020-06-08 NOTE — Assessment & Plan Note (Signed)
Reviewed findings of HRCT, being managed by primary pulmonologist Dr. Melvyn Novas

## 2020-06-08 NOTE — Progress Notes (Signed)
Subjective:    Patient ID: Ronald Pavlov., male    DOB: 11-13-1945, 75 y.o.   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  Since he was symptomatic of his machine we decided to continue his CPAP rather than reassessing with sleep studies.  On his last visit with his primary pulmonologist Ronald. Melvyn Gillespie, he was referred back to me to reassess for OSA He had bariatric surgery and has steadily lost weight, maximum was 350 pounds, he has now dropped to 214 pounds. He feels that weight loss was also worsened by episode of Enterobacter septic shock 07/2019.  Epworth sleepiness score is 8. He denies daytime somnolence and fatigue, bed partner has not noted snoring or witnessed apneas. He denies headaches on waking up.  He does report extreme dryness of mouth. CPAP download was reviewed which shows residual AHI of 8/hour on CPAP of 11 cm with EPR setting of 2 with average usage 4.5 hours per night and large leak.  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 S/p bariatric surgery, started at 350 , lowest 222 Auto 11/2012: optimal pressure 10cm  HRCT 10/2019 - without clear apicobasilar gradient, stable to slightly progressed since 10/2018 . Stable mild patchy air trapping in both lungs. 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 Fibrotic NSIP is on the differential. Findings are suggestive of an alternative diagnosis (not UIP)   FENO 11/23/2016  =   40 off all rx - Spirometry 11/23/2016  FEV1 4.06 (106%)  Ratio 82 with min curvature  Off all rx  02/06/2019-pulmonary function test-FVC 4.95 (100% predicted), postbronchodilator ratio  87, postbronchodilator FEV1 4.34 (120% predicted), no positive bronchodilator response, DLCO 23.86 (84% predicted), normal flow volume loops  Past Medical History:  Diagnosis Date  . Arthritis   . Asthma    anxiety, tree/grass pollen  . BPH (benign prostatic hyperplasia)   . Bronchitis    hx of  . Bulge of cervical disc without myelopathy   . Chest pain    a. 06/2015 MV: EF 56%, no ischemia/infarct.  . Complication of anesthesia 0/35/46   no complications but just says he typically needs "more than expected" to anesthetize; needs CPAP (setting 10) in recovery  . Depression    ADD  . Diabetes mellitus    PCP [name], type 2  . Insomnia   . Low blood pressure   . Mitral valve prolapse    a. 06/2015 Echo: EF 65-70%, mild LVH, no rwma, mild MVP of ant leaflet and mild to mod posteriorly directed MR, mildly dil La.  . Obesity   . PVC's (premature ventricular contractions)   . Recurrent upper respiratory infection (URI)    states [name] aware- no fever- states is improving  . Sleep apnea    settings 10.6  / followed by [name] study years ago  . Tremor    d/t lithium    Past Surgical History:  Procedure Laterality Date  . CARDIAC CATHETERIZATION     2008 pre op gastric bypass  . COLONOSCOPY W/ POLYPECTOMY    . 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 JOINT REPLACEMENT  Left, Right Knee, Right hip  . KNEE ARTHROSCOPY     right x 3-4, left x 2  . KNEE ARTHROTOMY  ~1991   right  . LUMBAR FUSION  04/03/2013   [name], L5-S1  . POLYPECTOMY     vocal cords 1992  . 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   Procedure: LEFT TOTAL HIP ARTHROPLASTY ANTERIOR APPROACH;  Surgeon: Ronald Cancel, MD;  Location: [facility];  Service: Orthopedics;  Laterality: Left;  . TOTAL HIP REVISION  04/22/2011   Procedure: TOTAL HIP REVISION;  Surgeon: Ronald Alf, MD;  Location: [facility];  Service: Orthopedics;  Laterality: Right;    Allergies  Allergen Reactions  . Breo Ellipta [Fluticasone  Furoate-Vilanterol] Other (See Comments)    Thrush  . Demerol [Meperidine] Itching  . Lamotrigine Hives and Itching  . Nsaids Nausea Only    Gastric bypass   Social History   Socioeconomic History  . Marital status: Married    Spouse name: Not on file  . Number of children: Not on file  . Years of education: college  . Highest education level: Not on file  Occupational History  . Occupation: Retired Equities trader: [employer]  Tobacco Use  . Smoking status: Former Smoker    Packs/day: 1.50    Years: 20.00    Pack years: 30.00    Types: Cigarettes    Quit date: 04/17/1990    Years since quitting: 30.1  . Smokeless tobacco: Never Used  Vaping Use  . Vaping Use: Never used  Substance and Sexual Activity  . Alcohol use: No  . Drug use: No  . Sexual activity: Not on file  Other Topics Concern  . Not on file  Social History Narrative   Patient works for [employer]. Patient has masters degree. Patient is married.   Social Determinants of Health   Financial Resource Strain: Not on file  Food Insecurity: Not on file  Transportation Needs: Not on file  Physical Activity: Not on file  Stress: Not on file  Social Connections: Not on file  Intimate Partner Violence: Not on file    Family History  Problem Relation Age of Onset  . Heart disease Father   . Anxiety disorder Mother   . Asthma Maternal Grandmother      Review of Systems Constitutional: negative for anorexia, fevers and sweats  Eyes: negative for irritation, redness and visual disturbance  Ears, nose, mouth, throat, and face: negative for earaches, epistaxis, nasal congestion and sore throat  Respiratory: negative for cough, dyspnea on exertion, sputum and wheezing  Cardiovascular: negative for chest pain, dyspnea, lower extremity edema, orthopnea, palpitations and syncope  Gastrointestinal: negative for abdominal pain, constipation, diarrhea, melena, nausea and vomiting  Genitourinary:negative for dysuria,  frequency and hematuria  Hematologic/lymphatic: negative for bleeding, easy bruising and lymphadenopathy  Musculoskeletal:negative for arthralgias, muscle weakness and stiff joints  Neurological: negative for coordination problems, gait problems, headaches and weakness  Endocrine: negative for diabetic symptoms including polydipsia, polyuria and weight loss     Objective:   Physical Exam  Gen. Pleasant, obese, in no distress, normal affect ENT - no pallor,icterus, no post nasal drip, class 2-3 airway Neck: No JVD, no thyromegaly, no carotid bruits Lungs: no use of accessory muscles, no dullness to percussion, decreased without rales or rhonchi  Cardiovascular: Rhythm regular, heart sounds  normal, no murmurs or gallops, no peripheral edema Abdomen: soft and non-tender, no hepatosplenomegaly, BS normal. Musculoskeletal: No deformities, no cyanosis or clubbing Neuro:  alert, non focal, no tremors       Assessment & Plan:

## 2020-06-08 NOTE — Assessment & Plan Note (Signed)
He has lost significant weight and we should reassess degree of OSA by repeating home sleep test.  He still has few residual events on CPAP so I feel he will have residual OSA.  Although he does not appear to be overly symptomatic. For his dryness I feel this may be related to an oral leak.  I will offer him a chinstrap for now and if he has significant OSA then we can consider changing him to a full facemask  Weight loss encouraged, compliance with goal of at least 4-6 hrs every night is the expectation. Advised against medications with sedative side effects Cautioned against driving when sleepy - understanding that sleepiness will vary on a day to day basis

## 2020-06-10 DIAGNOSIS — L03012 Cellulitis of left finger: Secondary | ICD-10-CM | POA: Diagnosis not present

## 2020-06-10 DIAGNOSIS — M4326 Fusion of spine, lumbar region: Secondary | ICD-10-CM | POA: Diagnosis not present

## 2020-06-19 DIAGNOSIS — Z23 Encounter for immunization: Secondary | ICD-10-CM | POA: Diagnosis not present

## 2020-06-24 ENCOUNTER — Ambulatory Visit: Payer: Medicare Other | Admitting: Allergy & Immunology

## 2020-07-09 ENCOUNTER — Encounter: Payer: Self-pay | Admitting: Pulmonary Disease

## 2020-08-07 DIAGNOSIS — G894 Chronic pain syndrome: Secondary | ICD-10-CM | POA: Diagnosis not present

## 2020-08-07 DIAGNOSIS — M4326 Fusion of spine, lumbar region: Secondary | ICD-10-CM | POA: Diagnosis not present

## 2020-08-07 DIAGNOSIS — Z79891 Long term (current) use of opiate analgesic: Secondary | ICD-10-CM | POA: Diagnosis not present

## 2020-08-07 DIAGNOSIS — Z79899 Other long term (current) drug therapy: Secondary | ICD-10-CM | POA: Diagnosis not present

## 2020-08-25 DIAGNOSIS — F3130 Bipolar disorder, current episode depressed, mild or moderate severity, unspecified: Secondary | ICD-10-CM | POA: Diagnosis not present

## 2020-09-23 DIAGNOSIS — R3914 Feeling of incomplete bladder emptying: Secondary | ICD-10-CM | POA: Diagnosis not present

## 2020-09-23 DIAGNOSIS — R351 Nocturia: Secondary | ICD-10-CM | POA: Diagnosis not present

## 2020-09-23 DIAGNOSIS — N401 Enlarged prostate with lower urinary tract symptoms: Secondary | ICD-10-CM | POA: Diagnosis not present

## 2020-09-24 DIAGNOSIS — M21372 Foot drop, left foot: Secondary | ICD-10-CM | POA: Diagnosis not present

## 2020-09-24 DIAGNOSIS — Z6828 Body mass index (BMI) 28.0-28.9, adult: Secondary | ICD-10-CM | POA: Diagnosis not present

## 2020-09-24 DIAGNOSIS — M4326 Fusion of spine, lumbar region: Secondary | ICD-10-CM | POA: Diagnosis not present

## 2020-11-03 DIAGNOSIS — M21372 Foot drop, left foot: Secondary | ICD-10-CM | POA: Diagnosis not present

## 2020-11-03 DIAGNOSIS — M4326 Fusion of spine, lumbar region: Secondary | ICD-10-CM | POA: Diagnosis not present

## 2020-11-19 ENCOUNTER — Other Ambulatory Visit: Payer: Self-pay

## 2020-11-19 ENCOUNTER — Inpatient Hospital Stay: Payer: Medicare Other | Attending: Hematology and Oncology

## 2020-11-19 DIAGNOSIS — D500 Iron deficiency anemia secondary to blood loss (chronic): Secondary | ICD-10-CM

## 2020-11-19 DIAGNOSIS — E611 Iron deficiency: Secondary | ICD-10-CM | POA: Diagnosis not present

## 2020-11-19 LAB — CBC WITH DIFFERENTIAL/PLATELET
Abs Immature Granulocytes: 0.01 10*3/uL (ref 0.00–0.07)
Basophils Absolute: 0.1 10*3/uL (ref 0.0–0.1)
Basophils Relative: 1 %
Eosinophils Absolute: 0.5 10*3/uL (ref 0.0–0.5)
Eosinophils Relative: 10 %
HCT: 34.4 % — ABNORMAL LOW (ref 39.0–52.0)
Hemoglobin: 11.3 g/dL — ABNORMAL LOW (ref 13.0–17.0)
Immature Granulocytes: 0 %
Lymphocytes Relative: 24 %
Lymphs Abs: 1.3 10*3/uL (ref 0.7–4.0)
MCH: 30.4 pg (ref 26.0–34.0)
MCHC: 32.8 g/dL (ref 30.0–36.0)
MCV: 92.5 fL (ref 80.0–100.0)
Monocytes Absolute: 0.4 10*3/uL (ref 0.1–1.0)
Monocytes Relative: 7 %
Neutro Abs: 3 10*3/uL (ref 1.7–7.7)
Neutrophils Relative %: 58 %
Platelets: 187 10*3/uL (ref 150–400)
RBC: 3.72 MIL/uL — ABNORMAL LOW (ref 4.22–5.81)
RDW: 12.2 % (ref 11.5–15.5)
WBC: 5.2 10*3/uL (ref 4.0–10.5)
nRBC: 0 % (ref 0.0–0.2)

## 2020-11-19 LAB — FERRITIN: Ferritin: 101 ng/mL (ref 24–336)

## 2020-11-19 LAB — IRON AND TIBC
Iron: 73 ug/dL (ref 42–163)
Saturation Ratios: 28 % (ref 20–55)
TIBC: 264 ug/dL (ref 202–409)
UIBC: 191 ug/dL (ref 117–376)

## 2020-11-26 ENCOUNTER — Other Ambulatory Visit: Payer: Self-pay

## 2020-11-26 ENCOUNTER — Ambulatory Visit (INDEPENDENT_AMBULATORY_CARE_PROVIDER_SITE_OTHER): Payer: Medicare Other | Admitting: Cardiovascular Disease

## 2020-11-26 ENCOUNTER — Encounter: Payer: Self-pay | Admitting: Cardiovascular Disease

## 2020-11-26 VITALS — BP 122/64 | HR 75 | Resp 20 | Ht 74.0 in | Wt 214.0 lb

## 2020-11-26 DIAGNOSIS — G4733 Obstructive sleep apnea (adult) (pediatric): Secondary | ICD-10-CM

## 2020-11-26 DIAGNOSIS — Z9989 Dependence on other enabling machines and devices: Secondary | ICD-10-CM | POA: Diagnosis not present

## 2020-11-26 DIAGNOSIS — J8410 Pulmonary fibrosis, unspecified: Secondary | ICD-10-CM | POA: Diagnosis not present

## 2020-11-26 DIAGNOSIS — I340 Nonrheumatic mitral (valve) insufficiency: Secondary | ICD-10-CM | POA: Diagnosis not present

## 2020-11-26 DIAGNOSIS — I700 Atherosclerosis of aorta: Secondary | ICD-10-CM

## 2020-11-26 DIAGNOSIS — I493 Ventricular premature depolarization: Secondary | ICD-10-CM

## 2020-11-26 DIAGNOSIS — E7800 Pure hypercholesterolemia, unspecified: Secondary | ICD-10-CM

## 2020-11-26 DIAGNOSIS — I2510 Atherosclerotic heart disease of native coronary artery without angina pectoris: Secondary | ICD-10-CM

## 2020-11-26 DIAGNOSIS — E663 Overweight: Secondary | ICD-10-CM

## 2020-11-26 DIAGNOSIS — Z8639 Personal history of other endocrine, nutritional and metabolic disease: Secondary | ICD-10-CM | POA: Diagnosis not present

## 2020-11-26 NOTE — Patient Instructions (Signed)
Medication Instructions:  No changes *If you need a refill on your cardiac medications before your next appointment, please call your pharmacy*   Lab Work: None ordered If you have labs (blood work) drawn today and your tests are completely normal, you will receive your results only by: [place] (if you have MyChart) OR A paper copy in the mail If you have any lab test that is abnormal or we need to change your treatment, we will call you to review the results.   Testing/Procedures: None ordered   Follow-Up: At [facility], you and your health needs are our priority.  As part of our continuing mission to provide you with exceptional heart care, we have created designated Provider Care Teams.  These Care Teams include your primary Cardiologist (physician) and Advanced Practice Providers (APPs -  Physician Assistants and Nurse Practitioners) who all work together to provide you with the care you need, when you need it.  We recommend signing up for the patient portal called "MyChart".  Sign up information is provided on this After Visit Summary.  MyChart is used to connect with patients for Virtual Visits (Telemedicine).  Patients are able to view lab/test results, encounter notes, upcoming appointments, etc.  Non-urgent messages can be sent to your provider as well.   To learn more about what you can do with MyChart, go to [email or web address].    Your next appointment:   12 month(s)  The format for your next appointment:   In Person  Provider:   You may see Dr. Sallyanne Kuster or one of the following Advanced Practice Providers on your designated Care Team:   Almyra Deforest, PA-C Fabian Sharp, PA-C or  Roby Lofts, [place]

## 2020-11-26 NOTE — Progress Notes (Signed)
Cardiology office note   Date:  11/26/2020   ID:  Ronald Pavlov., DOB March 26, 1945, MRN OF:[id number]  PCP:  Lajean Manes, MD  Cardiologist:  Brennan Litzinger Electrophysiologist:  None   Evaluation Performed:  Follow-Up Visit  Chief Complaint: CAD, PVCs  History of Present Illness:    Ronald Newberg. is a 75 y.o. male with known moderate CAD managed medically, mitral valve prolapse of the anterior leaflet with mild to moderate mitral insufficiency, diabetes mellitus, hyperlipidemia, symptomatic PVCs, OSA on CPAP, pulmonary fibrosis, aortic atherosclerosis.  His major limitations continue to be driven by his Ortho problems.  He has had bilateral hip and knee replacements and lumbar spine surgery.  His back pain is the biggest problem.  A few days ago he had a near syncopal event.  He jumped fast out of bed first thing in the morning and after taking 3 steps felt very dizzy and weak and his legs crumpled and he fell to the floor.  He immediately felt better once he was horizontal and the problem has not recurred.  He takes tamsulosin for prostatism.  He denies any other cardiovascular complaints.  He has frequent PVCs but is unaware of them.  Multiple PVCs were seen on his EKG today, but he was oblivious to them.  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  The patient specifically denies any chest pain at rest or with exertion, dyspnea at rest or with exertion, orthopnea, paroxysmal nocturnal dyspnea, focal neurological deficits, intermittent claudication, lower extremity edema, unexplained weight gain, cough, hemoptysis or wheezing.  He was placed on Effexor about a year ago and precipitously lost his appetite and lost almost 16 pounds of weight.  After the medication was discontinued he has gained weight back.  His BMI is now around 27.  The last lipid profile that I can review is excellent with an LDL of 53, but I do not have his most  recent readings.  He reports that he was was told by Dr. Felipa Eth that his readings were excellent and he recommended cutting his statin dose in half.   His last hemoglobin A1c from January of this year was in range at 6.4%.  He has normal renal function with a creatinine of 0.72.  Cardiac catheterization in 2008 showed a 95% stenosis in a small third diagonal artery branch, with mild plaque elsewhere.  He had a low risk nuclear stress test in April 2017.  Echo in 2017 showed LVEF 65-70% with mild mitral regurgitation.  He does not have congestive heart failure.    Past Medical History:  Diagnosis Date   Arthritis    Asthma    anxiety, tree/grass pollen   BPH (benign prostatic hyperplasia)    Bronchitis    hx of   Bulge of cervical disc without myelopathy    Chest pain    a. 06/2015 MV: EF 56%, no ischemia/infarct.   Complication of anesthesia [id number]   no complications but just says he typically needs "more than expected" to anesthetize; needs CPAP (setting 10) in recovery   Depression    ADD   Diabetes mellitus    PCP [name], type 2   Insomnia    Low blood pressure    Mitral valve prolapse    a. 06/2015 Echo: EF 65-70%, mild LVH, no rwma, mild MVP of ant leaflet and mild to mod posteriorly directed MR, mildly dil La.   Obesity    PVC's (  premature ventricular contractions)    Recurrent upper respiratory infection (URI)    states Dr Wynelle Link aware- no fever- states is improving   Sleep apnea    settings 10.6  / followed by [name] study years ago   Tremor    d/t lithium   Past Surgical History:  Procedure Laterality Date   CARDIAC CATHETERIZATION     2008 pre op gastric bypass   COLONOSCOPY W/ POLYPECTOMY     GASTRIC BYPASS  2008   Roux en y   JOINT REPLACEMENT     Left, Right Knee, Right hip   KNEE ARTHROSCOPY     right x 3-4, left x 2   KNEE ARTHROTOMY  ~1991   right   LUMBAR FUSION  04/03/2013   [name], L5-S1   POLYPECTOMY     vocal cords 1992   TOTAL  HIP ARTHROPLASTY Left 10/21/2014   Procedure: LEFT TOTAL HIP ARTHROPLASTY ANTERIOR APPROACH;  Surgeon: [name], MD;  Location: [facility];  Service: Orthopedics;  Laterality: Left;   TOTAL HIP REVISION  04/22/2011   Procedure: TOTAL HIP REVISION;  Surgeon: Gearlean Alf, MD;  Location: [facility];  Service: Orthopedics;  Laterality: Right;     Current Meds  Medication Sig   albuterol (VENTOLIN HFA) 108 (90 Base) MCG/ACT inhaler TAKE 2 PUFFS BY MOUTH EVERY 6 HOURS AS NEEDED FOR WHEEZE OR SHORTNESS OF BREATH   aspirin EC 81 MG tablet Take 81 mg by mouth daily as needed for mild pain.   clonazePAM (KLONOPIN) 0.5 MG tablet Take 0.5 mg by mouth daily.    dutasteride (AVODART) 0.5 MG capsule Take 0.5 mg by mouth daily.   finasteride (PROSCAR) 5 MG tablet Take 1 tablet by mouth daily.   fluticasone (FLONASE) 50 MCG/ACT nasal spray Place 2 sprays into the nose 2 (two) times daily.   gabapentin (NEURONTIN) 300 MG capsule Take 1 capsule by mouth at bedtime. Pt takes 1 capsule in morning and 1 capsule by mouth at bedtime.   HYDROcodone-acetaminophen (NORCO) 10-325 MG per tablet Take 1-2 tablets by mouth every 6 (six) hours as needed for moderate pain. (Patient taking differently: Take 1 tablet by mouth in the morning, at noon, and at bedtime.)   metFORMIN (GLUCOPHAGE) 500 MG tablet Take 500 mg by mouth 2 (two) times daily with a meal.    methocarbamol (ROBAXIN) 500 MG tablet Take 1,000 mg by mouth at bedtime.    Multiple Vitamins-Minerals (CENTRUM SILVER ADULT 50+ PO) Take 1 tablet by mouth daily.   pravastatin (PRAVACHOL) 40 MG tablet Take 40 mg by mouth daily.   tamsulosin (FLOMAX) 0.4 MG CAPS capsule Take 0.4 mg by mouth daily.   UNABLE TO FIND Med Name: CPAP 10 cm     Allergies:   Breo ellipta [fluticasone furoate-vilanterol], Demerol [meperidine], Lamotrigine, and Nsaids   Social History   Tobacco Use   Smoking status: Former    Packs/day: 1.50    Years: 20.00    Pack years: 30.00    Types:  Cigarettes    Quit date: 04/17/1990    Years since quitting: 30.6   Smokeless tobacco: Never  Vaping Use   Vaping Use: Never used  Substance Use Topics   Alcohol use: No   Drug use: No     Family Hx: The patient's family history includes Anxiety disorder in his mother; Asthma in his maternal grandmother; Heart disease in his father.  ROS:   Please see the history of present illness.  All other systems are reviewed and are negative.   Prior CV studies:   The following studies were reviewed today: High resolution chest CT November 05, 2019 1. 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 Stable mild patchy air trapping in both lungs. 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 Fibrotic NSIP is on the differential. Findings are suggestive of an alternative diagnosis (not UIP) per consensus guidelines: Diagnosis of Idiopathic Pulmonary Fibrosis: An Official ATS/ERS/JRS/ALAT Clinical Practice Guideline. [place], Iss 5, [phone], Nov 12 2016. 2. Three-vessel coronary atherosclerosis. 3. Small pericardial effusion/thickening, slightly increased. 4. Aortic Atherosclerosis (ICD10-I70.0) and Emphysema (ICD10-J43.9).  Labs/Other Tests and Data Reviewed:    EKG: Normal sinus rhythm, old RBBB, PVCs occasional pattern of bigeminy.  No ischemic changes were seen  Recent Labs:  BMET    Component Value Date/Time   NA 141 05/19/2020 1129   K 4.1 05/19/2020 1129   CL 107 05/19/2020 1129   CO2 28 05/19/2020 1129   GLUCOSE 115 (H) 05/19/2020 1129   BUN 17 05/19/2020 1129   CREATININE 0.72 05/19/2020 1129   CALCIUM 8.9 05/19/2020 1129   GFRNONAA >60 05/19/2020 1129   GFRAA >60 07/25/2019 0440   GFRAA >60 02/04/2019 0854    LABS from 09/06/2018  Creatinine 0.86, hemoglobin A1c 6.4%, glucose  108, hemoglobin 11.9, potassium 4.7, ferritin 12, normal liver function tests  LABS from 02/02/2018 Total cholesterol 141, HDL 43, LDL 73, triglycerides 125  Labs from 09/25/2019 Hemoglobin A1c 5.6%, hemoglobin 11.5, creatinine 0.99, potassium 4.7, normal liver function tests  Recent Lipid Panel Lab Results  Component Value Date/Time   CHOL 218 (H) 06/29/2015 07:29 PM   TRIG 173 (H) 06/29/2015 07:29 PM   HDL 42 06/29/2015 07:29 PM   CHOLHDL 5.2 06/29/2015 07:29 PM   LDLCALC 141 (H) 06/29/2015 07:29 PM   Labs from 01/18/2019 Total cholesterol 126, HDL 54, LDL 53, triglycerides 92  Wt Readings from Last 3 Encounters:  11/26/20 214 lb (97.1 kg)  06/08/20 214 lb (97.1 kg)  05/19/20 200 lb 11.2 oz (91 kg)     Objective:    Vital Signs:  BP 122/64 (BP Location: Left Arm, Patient Position: Sitting, Cuff Size: Normal)   Pulse 75   Resp 20   Ht '6\' 2"'$  (1.88 m)   Wt 214 lb (97.1 kg)   SpO2 98%   BMI 27.48 kg/m      General: Alert, oriented x3, no distress, overweight Head: no evidence of trauma, PERRL, EOMI, no exophtalmos or lid lag, no myxedema, no xanthelasma; normal ears, nose and oropharynx Neck: normal jugular venous pulsations and no hepatojugular reflux; brisk carotid pulses without delay and no carotid bruits Chest: clear to auscultation, no signs of consolidation by percussion or palpation, normal fremitus, symmetrical and full respiratory excursions Cardiovascular: normal position and quality of the apical impulse, regular rhythm, normal first and second heart sounds, he has a midsystolic click and a grade 2/6 mid peaking systolic murmur that is heard both at the base and at the apex.   Abdomen: no tenderness or distention, no masses by palpation, no abnormal pulsatility or arterial bruits, normal bowel sounds, no hepatosplenomegaly Extremities: no clubbing, cyanosis or edema; 2+ radial, ulnar and brachial pulses bilaterally; 2+ right femoral, posterior tibial and dorsalis  pedis pulses; 2+ left femoral, posterior tibial and dorsalis pedis pulses; no subclavian or femoral  bruits Neurological: grossly nonfocal Psych: Normal mood and affect    ASSESSMENT & PLAN:    1. Coronary artery disease involving native coronary artery of native heart without angina pectoris   2. Aortic atherosclerosis ([place])   3. Pulmonary fibrosis ([place])   4. Nonrheumatic mitral valve regurgitation   5. PVCs (premature ventricular contractions)   6. OSA on CPAP   7. History of diabetes mellitus, type II   8. Hypercholesterolemia   9. Overweight        CAD: He does not have angina at rest or with activity.  His risk factors seem to be mostly well addressed, but encouraged him to eat a healthier diet and exercise more.  Previous cardiac catheterization showed plaque in all major coronary arteries, but he only had a significant stenosis in a very small and distal diagonal branch.  Sedentary lifestyle is the biggest issue.  Before the lifestyle changes related to his back problems, he had excellent functional status as recently as May 2021. Aortic atherosclerosis: Noted on imaging studies.  Normal caliber aorta with no symptoms of PAD. Dilated pulmonary artery: This was reported on a previous CT of the chest, but not commented on the most recent scan. Pulmonary fibrosis: The does not appear to be any progression of his symptoms, but functional status is hard to assess since he is less physically active.  Substantial, but stable abnormalities are seen on his chest CT.  Note that in 2018 he had pulmonary spirometry which showed FVC of 97% of predicted and FEV1 greater than 100% of predicted.   MVP/MR: He does not have any symptoms of worsening mitral insufficiency.  His murmur is unchanged. PVCs: These have been present for many many years.  They are recorded on today's ECG but he is asymptomatic. OSA:  he reports compliance with therapy and denies daytime hypersomnolence. DM:  Well-controlled. HLP: All lipid parameters are in target range.  We will request his most recent lipid profile from PCP. Overweight: He was moderately obese.  Lost a very large amount of weight on Effexor, has gained back some of it.  He is now in overweight range    COVID-19 Education: The signs and symptoms of COVID-19 were discussed with the patient and how to seek care for testing (follow up with PCP or arrange E-visit).  The importance of social distancing was discussed today.  Time:   Today, I have spent 21 minutes with the patient with telehealth technology discussing the above problems.     Medication Adjustments/Labs and Tests Ordered: Current medicines are reviewed at length with the patient today.  Concerns regarding medicines are outlined above.   Tests Ordered: Orders Placed This Encounter  Procedures   EKG 12-Lead     Medication Changes: No orders of the defined types were placed in this encounter.   Follow Up:  Virtual Visit or In Person  1 year  Signed, Sanda Klein, MD  11/26/2020 11:04 AM    [place]

## 2020-11-30 DIAGNOSIS — Z23 Encounter for immunization: Secondary | ICD-10-CM | POA: Diagnosis not present

## 2020-12-10 DIAGNOSIS — Z23 Encounter for immunization: Secondary | ICD-10-CM | POA: Diagnosis not present

## 2020-12-23 DIAGNOSIS — F3130 Bipolar disorder, current episode depressed, mild or moderate severity, unspecified: Secondary | ICD-10-CM | POA: Diagnosis not present

## 2020-12-28 DIAGNOSIS — M4326 Fusion of spine, lumbar region: Secondary | ICD-10-CM | POA: Diagnosis not present

## 2020-12-28 DIAGNOSIS — M21372 Foot drop, left foot: Secondary | ICD-10-CM | POA: Diagnosis not present

## 2021-02-16 DIAGNOSIS — M4326 Fusion of spine, lumbar region: Secondary | ICD-10-CM | POA: Diagnosis not present

## 2021-02-16 DIAGNOSIS — G894 Chronic pain syndrome: Secondary | ICD-10-CM | POA: Diagnosis not present

## 2021-02-16 DIAGNOSIS — M5416 Radiculopathy, lumbar region: Secondary | ICD-10-CM | POA: Diagnosis not present

## 2021-02-16 DIAGNOSIS — Z79891 Long term (current) use of opiate analgesic: Secondary | ICD-10-CM | POA: Diagnosis not present

## 2021-02-16 DIAGNOSIS — Z79899 Other long term (current) drug therapy: Secondary | ICD-10-CM | POA: Diagnosis not present

## 2021-02-16 DIAGNOSIS — M961 Postlaminectomy syndrome, not elsewhere classified: Secondary | ICD-10-CM | POA: Diagnosis not present

## 2021-02-16 DIAGNOSIS — M21372 Foot drop, left foot: Secondary | ICD-10-CM | POA: Diagnosis not present

## 2021-02-22 DIAGNOSIS — F3130 Bipolar disorder, current episode depressed, mild or moderate severity, unspecified: Secondary | ICD-10-CM | POA: Diagnosis not present

## 2021-02-25 DIAGNOSIS — M25561 Pain in right knee: Secondary | ICD-10-CM | POA: Diagnosis not present

## 2021-02-26 ENCOUNTER — Telehealth: Payer: Self-pay | Admitting: Cardiovascular Disease

## 2021-02-26 NOTE — Telephone Encounter (Signed)
STAT if patient feels like he/she is going to faint   Are you dizzy now? No   Do you feel faint or have you passed out? Feels faint to the point of falling, but not passing out. Occurred 3x within 5 days last week.   Do you have any other symptoms? No   Have you checked your HR and BP (record if available)?  HR seems to stay low in the 50's-60's   Is not currently with patient.

## 2021-02-26 NOTE — Telephone Encounter (Signed)
Returned call to pt and notified of Dr. Victorino December note. Pt voiced understanding and thankful for the call.

## 2021-02-26 NOTE — Telephone Encounter (Signed)
Spoke with pt who states that he has passed out 3 times within the last week. Pt unable to give accurate BP. Pt reports feeling dizzy at times. Pt encouraged to be seen in the ED and pt refused.

## 2021-03-03 DIAGNOSIS — Z96651 Presence of right artificial knee joint: Secondary | ICD-10-CM | POA: Diagnosis not present

## 2021-03-12 DIAGNOSIS — R55 Syncope and collapse: Secondary | ICD-10-CM | POA: Diagnosis not present

## 2021-03-12 DIAGNOSIS — E1169 Type 2 diabetes mellitus with other specified complication: Secondary | ICD-10-CM | POA: Diagnosis not present

## 2021-03-17 DIAGNOSIS — Z96651 Presence of right artificial knee joint: Secondary | ICD-10-CM | POA: Diagnosis not present

## 2021-04-06 DIAGNOSIS — M961 Postlaminectomy syndrome, not elsewhere classified: Secondary | ICD-10-CM | POA: Diagnosis not present

## 2021-04-06 DIAGNOSIS — M5416 Radiculopathy, lumbar region: Secondary | ICD-10-CM | POA: Diagnosis not present

## 2021-04-06 DIAGNOSIS — M4326 Fusion of spine, lumbar region: Secondary | ICD-10-CM | POA: Diagnosis not present

## 2021-04-06 DIAGNOSIS — M21372 Foot drop, left foot: Secondary | ICD-10-CM | POA: Diagnosis not present

## 2021-04-26 DIAGNOSIS — F3130 Bipolar disorder, current episode depressed, mild or moderate severity, unspecified: Secondary | ICD-10-CM | POA: Diagnosis not present

## 2021-05-13 ENCOUNTER — Encounter: Payer: Self-pay | Admitting: Adult Health

## 2021-05-17 ENCOUNTER — Other Ambulatory Visit: Payer: Self-pay | Admitting: Hematology and Oncology

## 2021-05-17 DIAGNOSIS — D500 Iron deficiency anemia secondary to blood loss (chronic): Secondary | ICD-10-CM

## 2021-05-18 ENCOUNTER — Other Ambulatory Visit: Payer: Self-pay | Admitting: Internal Medicine

## 2021-05-18 ENCOUNTER — Ambulatory Visit
Admission: RE | Admit: 2021-05-18 | Discharge: 2021-05-18 | Disposition: A | Discharge status: Home / Self Care | Payer: Medicare Other | Source: Ambulatory Visit | Attending: Internal Medicine | Admitting: Internal Medicine

## 2021-05-18 ENCOUNTER — Encounter: Payer: Self-pay | Admitting: Adult Health

## 2021-05-18 DIAGNOSIS — R4781 Slurred speech: Secondary | ICD-10-CM | POA: Diagnosis not present

## 2021-05-18 DIAGNOSIS — R42 Dizziness and giddiness: Secondary | ICD-10-CM | POA: Diagnosis not present

## 2021-05-18 DIAGNOSIS — R296 Repeated falls: Secondary | ICD-10-CM

## 2021-05-18 DIAGNOSIS — I739 Peripheral vascular disease, unspecified: Secondary | ICD-10-CM | POA: Diagnosis not present

## 2021-05-18 DIAGNOSIS — I951 Orthostatic hypotension: Secondary | ICD-10-CM | POA: Diagnosis not present

## 2021-05-18 DIAGNOSIS — I672 Cerebral atherosclerosis: Secondary | ICD-10-CM | POA: Diagnosis not present

## 2021-05-18 DIAGNOSIS — R2689 Other abnormalities of gait and mobility: Secondary | ICD-10-CM | POA: Diagnosis not present

## 2021-05-20 ENCOUNTER — Inpatient Hospital Stay: Payer: Medicare Other | Admitting: Hematology and Oncology

## 2021-05-20 ENCOUNTER — Other Ambulatory Visit: Payer: Self-pay | Admitting: Hematology and Oncology

## 2021-05-20 ENCOUNTER — Inpatient Hospital Stay: Payer: Medicare Other | Attending: Hematology and Oncology

## 2021-05-20 DIAGNOSIS — Z9884 Bariatric surgery status: Secondary | ICD-10-CM

## 2021-05-20 NOTE — Progress Notes (Signed)
No show

## 2021-05-21 ENCOUNTER — Other Ambulatory Visit: Payer: Self-pay | Admitting: Geriatric Medicine

## 2021-05-21 ENCOUNTER — Other Ambulatory Visit: Payer: Self-pay | Admitting: Obstetrics and Gynecology

## 2021-05-21 DIAGNOSIS — R296 Repeated falls: Secondary | ICD-10-CM

## 2021-05-21 DIAGNOSIS — R42 Dizziness and giddiness: Secondary | ICD-10-CM

## 2021-05-28 ENCOUNTER — Ambulatory Visit
Admission: RE | Admit: 2021-05-28 | Discharge: 2021-05-28 | Disposition: A | Discharge status: Home / Self Care | Payer: Medicare Other | Source: Ambulatory Visit | Attending: Geriatric Medicine | Admitting: Geriatric Medicine

## 2021-05-28 ENCOUNTER — Other Ambulatory Visit: Payer: Self-pay

## 2021-05-28 DIAGNOSIS — R296 Repeated falls: Secondary | ICD-10-CM

## 2021-05-28 DIAGNOSIS — M4326 Fusion of spine, lumbar region: Secondary | ICD-10-CM | POA: Diagnosis not present

## 2021-05-28 DIAGNOSIS — M5416 Radiculopathy, lumbar region: Secondary | ICD-10-CM | POA: Diagnosis not present

## 2021-05-28 DIAGNOSIS — M961 Postlaminectomy syndrome, not elsewhere classified: Secondary | ICD-10-CM | POA: Diagnosis not present

## 2021-05-28 DIAGNOSIS — S0990XA Unspecified injury of head, initial encounter: Secondary | ICD-10-CM | POA: Diagnosis not present

## 2021-05-28 DIAGNOSIS — R42 Dizziness and giddiness: Secondary | ICD-10-CM

## 2021-05-28 DIAGNOSIS — M21372 Foot drop, left foot: Secondary | ICD-10-CM | POA: Diagnosis not present

## 2021-05-28 MED ORDER — GADOBENATE DIMEGLUMINE 529 MG/ML IV SOLN
20.0000 mL | Freq: Once | INTRAVENOUS | Status: AC | PRN
  Administered 2021-05-28: 20 mL via INTRAVENOUS

## 2021-06-06 ENCOUNTER — Other Ambulatory Visit: Payer: Medicare Other

## 2021-06-07 DIAGNOSIS — I951 Orthostatic hypotension: Secondary | ICD-10-CM | POA: Diagnosis not present

## 2021-06-07 DIAGNOSIS — R42 Dizziness and giddiness: Secondary | ICD-10-CM | POA: Diagnosis not present

## 2021-06-07 DIAGNOSIS — R296 Repeated falls: Secondary | ICD-10-CM | POA: Diagnosis not present

## 2021-06-10 ENCOUNTER — Other Ambulatory Visit: Payer: Medicare Other

## 2021-06-14 ENCOUNTER — Ambulatory Visit: Payer: Medicare Other | Attending: Geriatric Medicine

## 2021-06-14 DIAGNOSIS — M6281 Muscle weakness (generalized): Secondary | ICD-10-CM | POA: Diagnosis not present

## 2021-06-14 DIAGNOSIS — Z9181 History of falling: Secondary | ICD-10-CM | POA: Insufficient documentation

## 2021-06-15 NOTE — Therapy (Signed)
[place] ?[facility] ?[address] ?[place], [place], [address] ?Phone: [phone]   Fax:  [phone] ? ?Physical Therapy Evaluation ? ?Patient Details  ?Name: [name]. ?MRN: [id number] ?Date of Birth: 1945/09/16 ?Referring Provider (PT): Felipa Eth, MD ? ? ?Encounter Date: 06/14/2021 ? ? PT End of Session - 06/14/21 0904   ? ? Visit Number 1   ? Number of Visits 10   ? Date for PT Re-Evaluation 09/10/21   ? PT Start Time [phone]   ? PT Stop Time [id number]   ? PT Time Calculation (min) 40 min   ? Activity Tolerance Patient tolerated treatment well   ? Behavior During Therapy [facility] for tasks assessed/performed   ? ?  ?  ? ?  ? ? ?Past Medical History:  ?Diagnosis Date  ? Arthritis   ? Asthma   ? anxiety, tree/grass pollen  ? BPH (benign prostatic hyperplasia)   ? Bronchitis   ? hx of  ? Bulge of cervical disc without myelopathy   ? Chest pain   ? a. 06/2015 MV: EF 56%, no ischemia/infarct.  ? Complication of anesthesia 5/63/87  ? no complications but just says he typically needs "more than expected" to anesthetize; needs CPAP (setting 10) in recovery  ? Depression   ? ADD  ? Diabetes mellitus   ? PCP [name], type 2  ? Insomnia   ? Low blood pressure   ? Mitral valve prolapse   ? a. 06/2015 Echo: EF 65-70%, mild LVH, no rwma, mild MVP of ant leaflet and mild to mod posteriorly directed MR, mildly dil La.  ? Obesity   ? PVC's (premature ventricular contractions)   ? Recurrent upper respiratory infection (URI)   ? states Dr Wynelle Link aware- no fever- states is improving  ? Sleep apnea   ? settings 10.6  / followed by [name] study years ago  ? Tremor   ? d/t lithium  ? ? ?Past Surgical History:  ?Procedure Laterality Date  ? CARDIAC CATHETERIZATION    ? 2008 pre op gastric bypass  ? COLONOSCOPY W/ POLYPECTOMY    ? 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 JOINT REPLACEMENT    ? Left, Right Knee, Right hip  ? KNEE ARTHROSCOPY    ? right x 3-4, left x 2  ? KNEE ARTHROTOMY  ~1991  ? right   ? LUMBAR FUSION  04/03/2013  ? [name], L5-S1  ? POLYPECTOMY    ? vocal cords 1992  ? 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  ? Procedure: LEFT TOTAL HIP ARTHROPLASTY ANTERIOR APPROACH;  Surgeon: [name], MD;  Location: [facility];  Service: Orthopedics;  Laterality: Left;  ? TOTAL HIP REVISION  04/22/2011  ? Procedure: TOTAL HIP REVISION;  Surgeon: Gearlean Alf, MD;  Location: [facility];  Service: Orthopedics;  Laterality: Right;  ? ? ?There were no vitals filed for this visit. ? ? ? Subjective Assessment - 06/14/21 0904   ? ? Subjective Patient reports that he has been having balance problems since having his second lumbar fusion about 2 years ago.He felt his balance was exceptional until this condition. He feels that he has lost quite a bit of muscle mass due to age. He has experienced multiple falls with the most recent fall being on 05/12/21. He ended up hitting his head on the concrete which caused a concussion. He had an MRI which had found a history of a small stroke. He notes  that he has passed out before due to low blood pressure. He feels that his transition from sitting to standing is the worst. He notes that he developed drop foot on his left foot after his second back surgery. He was provided an AFO, but he only used this for a few months, but he is no longer using it. He notes that he has a cane and walker at home, but he does not regularly use these unless his wife tells him to use the cane.   ? Pertinent History bilateral THA and TKA , history of CVA's, multiple lumbar surgeries   ? Limitations Standing;Walking   ? How long can you stand comfortably? unsteadiness as soon as he stands   ? How long can you walk comfortably? unsteadiness with walking   ? Patient Stated Goals increased strength and balance   ? Currently in Pain? No/denies   Pain is managed by medication  ? ?  ?  ? ?  ? ? ? ? ? [facility] PT Assessment - 06/14/21 0001   ? ?  ? Assessment  ? Medical Diagnosis Gait disorder   ? Referring Provider  (PT) Felipa Eth, MD   ? Onset Date/Surgical Date --   2020  ? Next MD Visit unknown   ? Prior Therapy Not for balance   ?  ? Precautions  ? Precautions Fall   ?  ? Restrictions  ? Weight Bearing Restrictions No   ?  ? Balance Screen  ? Has the patient fallen in the past 6 months Yes   ? How many times? 8-10   ? Has the patient had a decrease in activity level because of a fear of falling?  Yes   ? Is the patient reluctant to leave their home because of a fear of falling?  Yes   ?  ? Home Environment  ? Living Environment Private residence   ? Living Arrangements Spouse/significant other   ? Home Access Stairs to enter   ? Entrance Stairs-Number of Steps 1   ?  ? Prior Function  ? Level of Independence Independent   ? Leisure listening to music, watch TV, photography, and read   ?  ? Cognition  ? Overall Cognitive Status Within Functional Limits for tasks assessed   ? Attention Focused   ? Focused Attention Appears intact   ? Memory Appears intact   ? Awareness Appears intact   ? Problem Solving Appears intact   ?  ? Sensation  ? Additional Comments Patient reports tingling along left leg (along L3 dermatomal pattern)   ?  ? ROM / Strength  ? AROM / PROM / Strength Strength   ?  ? Strength  ? Strength Assessment Site Knee;Hip;Ankle   ? Right/Left Hip Right;Left   ? Right Hip Flexion 4/5   ? Left Hip Flexion 4-/5   ? Right/Left Knee Left;Right   ? Right Knee Flexion 4/5   ? Right Knee Extension 5/5   ? Left Knee Flexion 4-/5   ? Left Knee Extension 4/5   ? Right/Left Ankle Right;Left   ? Right Ankle Dorsiflexion 4-/5   ? Left Ankle Dorsiflexion 3+/5   ?  ? Transfers  ? Transfers Sit to Stand;Stand to Sit   ? Sit to Stand 4: Min assist;Without upper extremity assist;Multiple attempts   ? Sit to Stand Details Other (comment)   minA for safety  ? Five time sit to stand comments  15.30 seconds   with minA for  one LOB  ? Stand to Sit 4: Min assist;Uncontrolled descent   ? Stand to Sit Details (indicate cue type and reason)  Other (comment)   minA for safety  ?  ? Ambulation/Gait  ? Ambulation/Gait Yes   ? Ambulation/Gait Assistance 5: Supervision   ? Assistive device None   ? Gait Pattern Step-through pattern;Decreased stride length   right hip IR, intermittent crossing of midline  ? Ambulation Surface Level;Indoor   ? Gait velocity decreased   ?  ? Balance  ? Balance Assessed Yes   ?  ? Static Standing Balance  ? Static Standing Balance -  Activities  Romberg - Eyes Opened;Tandam Stance - Left Leg;Tandam Stance - Right Leg   ? Static Standing - Comment/# of Minutes Rhomberg: 30 seconds (increased sway); Tandem (Right leading): unable to perform due to useadiness (Left leading): 4 seconds   ?  ? Standardized Balance Assessment  ? Standardized Balance Assessment Five Times Sit to Stand   ? Five times sit to stand comments  15.3   ? ?  ?  ? ?  ? ? ? ? ? ? ? ? ? ? ? ? ? ?Objective measurements completed on examination: See above findings.  ? ? ? ? ? ? ? ? ? ? ? ? ? ? ? ? ? ? ? PT Long Term Goals - 06/14/21 1258   ? ?  ? PT LONG TERM GOAL #1  ? Title Patient is will be independent with his HEP.   ? Time 5   ? Period Weeks   ? Status New   ? Target Date 07/19/21   ?  ? PT LONG TERM GOAL #2  ? Title Patient will improve his five time sit to stand to 12 seconds or less for improved safety and reduced fall risk.   ? Time 5   ? Period Weeks   ? Status New   ? Target Date 07/19/21   ?  ? PT LONG TERM GOAL #3  ? Title Patient will be able to demonstrate at least 5 seconds of tandem balance bilaterally for improved stability with narrow base of support.   ? Time 5   ? Period Weeks   ? Status New   ? Target Date 07/19/21   ? ?  ?  ? ?  ? ? ? ? ? ? ? ? ? Plan - 06/14/21 0952   ? ? Clinical Impression Statement Patient is a 76 year old male presenting to physical therapy with bilateral lower extremity weakness (L>R) and a history of multiple falls. He is at a high risk of falls as evidenced by his static balance, five time sit to stand, gait  mechanics, and his history of falling. He was recommended to utilize his cane for improved safety ambulating. He continues to require skilled physical therapy to address his impairments to improve his safety an

## 2021-06-17 ENCOUNTER — Ambulatory Visit: Payer: Medicare Other

## 2021-06-17 DIAGNOSIS — M6281 Muscle weakness (generalized): Secondary | ICD-10-CM | POA: Diagnosis not present

## 2021-06-17 DIAGNOSIS — Z9181 History of falling: Secondary | ICD-10-CM | POA: Diagnosis not present

## 2021-06-17 NOTE — Therapy (Signed)
[place] [facility] ?[address] ?[place], [place], [address] ?Phone: [phone]   Fax:  [phone] ? ?Physical Therapy Treatment ? ?Patient Details  ?Name: [name]. ?MRN: [id number] ?Date of Birth: 12/27/1945 ?Referring Provider (PT): Felipa Eth, MD ? ? ?Encounter Date: 06/17/2021 ? ? PT End of Session - 06/17/21 1352   ? ? Visit Number 2   ? Number of Visits 10   ? Date for PT Re-Evaluation 09/10/21   ? PT Start Time 1345   ? PT Stop Time 6979   ? PT Time Calculation (min) 42 min   ? Activity Tolerance Patient tolerated treatment well   ? Behavior During Therapy [facility] for tasks assessed/performed   ? ?  ?  ? ?  ? ? ?Past Medical History:  ?Diagnosis Date  ? Arthritis   ? Asthma   ? anxiety, tree/grass pollen  ? BPH (benign prostatic hyperplasia)   ? Bronchitis   ? hx of  ? Bulge of cervical disc without myelopathy   ? Chest pain   ? a. 06/2015 MV: EF 56%, no ischemia/infarct.  ? Complication of anesthesia 4/80/16  ? no complications but just says he typically needs "more than expected" to anesthetize; needs CPAP (setting 10) in recovery  ? Depression   ? ADD  ? Diabetes mellitus   ? PCP [name], type 2  ? Insomnia   ? Low blood pressure   ? Mitral valve prolapse   ? a. 06/2015 Echo: EF 65-70%, mild LVH, no rwma, mild MVP of ant leaflet and mild to mod posteriorly directed MR, mildly dil La.  ? Obesity   ? PVC's (premature ventricular contractions)   ? Recurrent upper respiratory infection (URI)   ? states Dr Wynelle Link aware- no fever- states is improving  ? Sleep apnea   ? settings 10.6  / followed by [name] study years ago  ? Tremor   ? d/t lithium  ? ? ?Past Surgical History:  ?Procedure Laterality Date  ? CARDIAC CATHETERIZATION    ? 2008 pre op gastric bypass  ? COLONOSCOPY W/ POLYPECTOMY    ? 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 JOINT REPLACEMENT    ? Left, Right Knee, Right hip  ? KNEE ARTHROSCOPY    ? right x 3-4, left x 2  ? KNEE ARTHROTOMY  ~1991  ? right   ? LUMBAR FUSION  04/03/2013  ? [name], L5-S1  ? POLYPECTOMY    ? vocal cords 1992  ? 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  ? Procedure: LEFT TOTAL HIP ARTHROPLASTY ANTERIOR APPROACH;  Surgeon: [name], MD;  Location: [facility];  Service: Orthopedics;  Laterality: Left;  ? TOTAL HIP REVISION  04/22/2011  ? Procedure: TOTAL HIP REVISION;  Surgeon: Gearlean Alf, MD;  Location: [facility];  Service: Orthopedics;  Laterality: Right;  ? ? ?There were no vitals filed for this visit. ? ? Subjective Assessment - 06/17/21 1350   ? ? Subjective Patient reports that he feels about the same today. He had some unsteadiness since his last appointment, but has not fallen.   ? Pertinent History bilateral THA and TKA , history of CVA's, multiple lumbar surgeries   ? Limitations Standing;Walking   ? How long can you stand comfortably? unsteadiness as soon as he stands   ? How long can you walk comfortably? unsteadiness with walking   ? Patient Stated Goals increased strength and balance   ? Currently in  Pain? No/denies   ? ?  ?  ? ?  ? ? ? ? ? ? ? ? ? ? ? ? ? ? ? ? ? ? ? ? [facility] Adult PT Treatment/Exercise - 06/17/21 0001   ? ?  ? Exercises  ? Exercises Knee/Hip   ?  ? Knee/Hip Exercises: Aerobic  ? Nustep L5-6 x 15 minutes   ?  ? Knee/Hip Exercises: Seated  ? Long [employer] Both;2 sets;15 reps;Weights   ? Long Arc Quad Weight 3 lbs.   ? Ball Squeeze 30 reps; 5 second hold   ? Clamshell with TheraBand Red   30 reps  ? Other Seated Knee/Hip Exercises Heel/toe raises   30 reps  ? Marching Both;2 sets;15 reps;Weights   ? Marching Weights 3 lbs.   ? Hamstring Curl Both;2 sets;15 reps   red t-band at ankles  ? ?  ?  ? ?  ? ? ? ? ? ? ? ? ? ? ? ? ? ? ? PT Long Term Goals - 06/14/21 1258   ? ?  ? PT LONG TERM GOAL #1  ? Title Patient is will be independent with his HEP.   ? Time 5   ? Period Weeks   ? Status New   ? Target Date 07/19/21   ?  ? PT LONG TERM GOAL #2  ? Title Patient will improve his five time sit to stand to 12 seconds or less  for improved safety and reduced fall risk.   ? Time 5   ? Period Weeks   ? Status New   ? Target Date 07/19/21   ?  ? PT LONG TERM GOAL #3  ? Title Patient will be able to demonstrate at least 5 seconds of tandem balance bilaterally for improved stability with narrow base of support.   ? Time 5   ? Period Weeks   ? Status New   ? Target Date 07/19/21   ? ?  ?  ? ?  ? ? ? ? ? ? ? ? Plan - 06/17/21 1352   ? ? Clinical Impression Statement Patient was introduced to multiple new interventions for improved lower extremity strength with moderate difficulty and fatigue. He required minimal cuing with today's interventions for proper pacing to facilitate proper biomechanics as his biomechanics deteriorated as he attempted to rush through these interventions. He reported feeling tired, but good upon the conclusion of treatment. He continues to require skilled physical therapy to address his remaining impairments to maximize his safety and functional mobility.   ? Personal Factors and Comorbidities Other;Time since onset of injury/illness/exacerbation;Comorbidity 3+   ? Comorbidities PVC, DM, chronic pain   ? Examination-Activity Limitations Locomotion Level;Transfers;Carry;Squat;Stand   ? Examination-Participation Restrictions Cleaning;Community Activity;Shop;Yard Work   ? Stability/Clinical Decision Making Unstable/Unpredictable   ? Rehab Potential Fair   ? PT Frequency 2x / week   ? PT Duration Other (comment)   5 weeks  ? PT Treatment/Interventions ADLs/Self Care Home Management;Cryotherapy;Electrical Stimulation;Moist Heat;Neuromuscular re-education;Balance training;Therapeutic exercise;Therapeutic activities;Functional mobility training;Gait training;Stair training;Patient/family education   ? PT Next Visit Plan nustep, lower extremity strengthening and balance interventions   ? Consulted and Agree with Plan of Care Patient   ? ?  ?  ? ?  ? ? ?Patient will benefit from skilled therapeutic intervention in order to  improve the following deficits and impairments:  Abnormal gait, Difficulty walking, Decreased activity tolerance, Decreased balance, Decreased mobility, Decreased strength, Impaired sensation, Pain ? ?Visit Diagnosis: ?History  of falling ? ?Muscle weakness (generalized) ? ? ? ? ?Problem List ?Patient Active Problem List  ? Diagnosis Date Noted  ? Pre-operative respiratory examination 11/13/2019  ? Ambulatory dysfunction 07/24/2019  ? Closed nondisplaced fracture of condyle of right femur ([place]) 07/24/2019  ? Bulge of cervical disc without myelopathy   ? Falls 07/23/2019  ? Cough 02/20/2019  ? Absolute anemia 02/04/2019  ? Iron deficiency anemia 02/04/2019  ? Abnormal findings on diagnostic imaging of lung 11/27/2018  ? ILD (interstitial lung disease) ([place]) 11/27/2018  ? Mild obesity 08/05/2017  ? Type 2 diabetes mellitus treated without insulin ([place]) 08/05/2017  ? MVP (mitral valve prolapse) 08/05/2017  ? Orthostatic hypotension 01/18/2017  ? Upper airway cough syndrome 11/23/2016  ? Cough variant asthma 11/23/2016  ? 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 Left ventricular hypertrophy 10/03/2015  ? Mixed hyperlipidemia 10/03/2015  ? PVCs (premature ventricular contractions) 07/13/2015  ? Obesity (BMI 30.0-34.9) 07/13/2015  ? Chest pain 06/29/2015  ? Uncontrolled diabetes mellitus without complication, without long-term current use of insulin 06/29/2015  ? Depression 06/29/2015  ? BPH (benign prostatic hyperplasia) 06/29/2015  ? OSA on CPAP 12/26/2006  ? ? ?Darlin Coco, PT ?06/17/2021, 2:30 PM ? ?[place] ?[facility] ?[address] ?[place], [place], [address] ?Phone: [phone]   Fax:  [phone] ? ?Name: [name]. ?MRN: [id number] ?Date of Birth: 1945-09-03 ? ? ? ?

## 2021-06-22 ENCOUNTER — Ambulatory Visit: Payer: Medicare Other | Admitting: *Deleted

## 2021-06-22 DIAGNOSIS — M6281 Muscle weakness (generalized): Secondary | ICD-10-CM

## 2021-06-22 DIAGNOSIS — Z9181 History of falling: Secondary | ICD-10-CM | POA: Diagnosis not present

## 2021-06-22 NOTE — Therapy (Signed)
[facility] [place], [place], [address] Phone: [phone]   Fax:  [phone]  Physical Therapy Treatment  Patient Details  Name: [name]. MRN: [id number] Date of Birth: 05/24/1945 Referring Provider (PT): Felipa Eth, MD   Encounter Date: 06/22/2021   PT End of Session - 06/22/21 1002     Visit Number 3    Number of Visits 10    Date for PT Re-Evaluation 09/10/21    PT Start Time 0900    PT Stop Time 0935    PT Time Calculation (min) 35 min             Past Medical History:  Diagnosis Date   Arthritis    Asthma    anxiety, tree/grass pollen   BPH (benign prostatic hyperplasia)    Bronchitis    hx of   Bulge of cervical disc without myelopathy    Chest pain    a. 06/2015 MV: EF 56%, no ischemia/infarct.   Complication of anesthesia 12/05/24   no complications but just says he typically needs "more than expected" to anesthetize; needs CPAP (setting 10) in recovery   Depression    ADD   Diabetes mellitus    PCP [name], type 2   Insomnia    Low blood pressure    Mitral valve prolapse    a. 06/2015 Echo: EF 65-70%, mild LVH, no rwma, mild MVP of ant leaflet and mild to mod posteriorly directed MR, mildly dil La.   Obesity    PVC's (premature ventricular contractions)    Recurrent upper respiratory infection (URI)    states Dr Wynelle Link aware- no fever- states is improving   Sleep apnea    settings 10.6  / followed by [name] study years ago   Tremor    d/t lithium    Past Surgical History:  Procedure Laterality Date   CARDIAC CATHETERIZATION     2008 pre op gastric bypass   COLONOSCOPY W/ POLYPECTOMY     GASTRIC BYPASS  2008   Roux en y   JOINT REPLACEMENT     Left, Right Knee, Right hip   KNEE ARTHROSCOPY     right x 3-4, left x 2   KNEE ARTHROTOMY  ~1991   right   LUMBAR FUSION  04/03/2013   [name], L5-S1   POLYPECTOMY     vocal cords 1992   TOTAL HIP ARTHROPLASTY Left  10/21/2014   Procedure: LEFT TOTAL HIP ARTHROPLASTY ANTERIOR APPROACH;  Surgeon: [name], MD;  [facility];  Service: Orthopedics;  Laterality: Left;   TOTAL HIP REVISION  04/22/2011   Procedure: TOTAL HIP REVISION;  Surgeon: Gearlean Alf, MD;  [facility];  Service: Orthopedics;  Laterality: Right;    There were no vitals filed for this visit.   Subjective Assessment - 06/22/21 0910     Subjective Need to leave by 9:30 today. Sore after last Rx    Pertinent History bilateral THA and TKA , history of CVA's, multiple lumbar surgeries    Limitations Standing;Walking    How long can you stand comfortably? unsteadiness as soon as he stands    How long can you walk comfortably? unsteadiness with walking    Patient Stated Goals increased strength and balance    Currently in Pain? Yes    Pain Score 3     Pain Location Back    Pain Orientation Right    Pain Descriptors / Indicators Aching;Sore  Pain Type Chronic pain                               [facility] Adult PT Treatment/Exercise - 06/22/21 0001       Neuro Re-ed    Neuro Re-ed Details  Rocker board DF/PF and balance x 6 mins CGA, side stepping on balance beam x 5 mins with CGA and UE assist as needed. Tandem stance x 5 each LE in front/back with CGA.      Exercises   Exercises Knee/Hip      Knee/Hip Exercises: Aerobic   Nustep L5-6 x 15 minutes                          PT Long Term Goals - 06/14/21 1258       PT LONG TERM GOAL #1   Title Patient is will be independent with his HEP.    Time 5    Period Weeks    Status New    Target Date 07/19/21      PT LONG TERM GOAL #2   Title Patient will improve his five time sit to stand to 12 seconds or less for improved safety and reduced fall risk.    Time 5    Period Weeks    Status New    Target Date 07/19/21      PT LONG TERM GOAL #3   Title Patient will be able to demonstrate at least 5 seconds of tandem balance  bilaterally for improved stability with narrow base of support.    Time 5    Period Weeks    Status New    Target Date 07/19/21                   Plan - 06/22/21 1003     Clinical Impression Statement Pt arrived today doing fair. He reported needing to leave early. Rx focused on LE conditioning as well as balance and proprioception act.'s. Pt needs constant supervision during balance act.'s    Personal Factors and Comorbidities Other;Time since onset of injury/illness/exacerbation;Comorbidity 3+    Comorbidities PVC, DM, chronic pain    Examination-Activity Limitations Locomotion Level;Transfers;Carry;Squat;Stand    Examination-Participation Restrictions Cleaning;Community Activity;Shop;Yard Work    Stability/Clinical Decision Making Unstable/Unpredictable    Rehab Potential Fair    PT Frequency 2x / week    PT Duration Other (comment)    PT Treatment/Interventions ADLs/Self Care Home Management;Cryotherapy;Electrical Stimulation;Moist Heat;Neuromuscular re-education;Balance training;Therapeutic exercise;Therapeutic activities;Functional mobility training;Gait training;Stair training;Patient/family education    PT Next Visit Plan nustep, lower extremity strengthening and balance interventions             Patient will benefit from skilled therapeutic intervention in order to improve the following deficits and impairments:  Abnormal gait, Difficulty walking, Decreased activity tolerance, Decreased balance, Decreased mobility, Decreased strength, Impaired sensation, Pain  Visit Diagnosis: History of falling  Muscle weakness (generalized)     Problem List Patient Active Problem List   Diagnosis Date Noted   Pre-operative respiratory examination 11/13/2019   Ambulatory dysfunction 07/24/2019   Closed nondisplaced fracture of condyle of right femur ([place]) 07/24/2019   Bulge of cervical disc without myelopathy    Falls 07/23/2019   Cough 02/20/2019   Absolute anemia  02/04/2019   Iron deficiency anemia 02/04/2019   Abnormal findings on diagnostic imaging of lung 11/27/2018   ILD (interstitial lung disease) ([place]) 11/27/2018  Mild obesity 08/05/2017   Type 2 diabetes mellitus treated without insulin ([place]) 08/05/2017   MVP (mitral valve prolapse) 08/05/2017   Orthostatic hypotension 01/18/2017   Upper airway cough syndrome 11/23/2016   Cough variant asthma 11/23/2016   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 MRN: [id number] Date of Birth: May 27, 1945

## 2021-06-24 ENCOUNTER — Ambulatory Visit: Payer: Medicare Other

## 2021-06-24 DIAGNOSIS — Z9181 History of falling: Secondary | ICD-10-CM | POA: Diagnosis not present

## 2021-06-24 DIAGNOSIS — M6281 Muscle weakness (generalized): Secondary | ICD-10-CM

## 2021-06-24 NOTE — Therapy (Signed)
[place] ?[facility] ?[address] ?[place], [place], [address] ?Phone: [phone]   Fax:  [phone] ? ?Physical Therapy Treatment ? ?Patient Details  ?Name: [name]. ?MRN: [id number] ?Date of Birth: [birth year] ?Referring Provider (PT): Felipa Eth, MD ? ? ?Encounter Date: 06/24/2021 ? ? PT End of Session - 06/24/21 7408   ? ? Visit Number 4   ? Number of Visits 10   ? Date for PT Re-Evaluation 09/10/21   ? PT Start Time 0900   ? PT Stop Time [phone]   ? PT Time Calculation (min) 43 min   ? Activity Tolerance Patient tolerated treatment well   ? Behavior During Therapy [facility] for tasks assessed/performed   ? ?  ?  ? ?  ? ? ?Past Medical History:  ?Diagnosis Date  ? Arthritis   ? Asthma   ? anxiety, tree/grass pollen  ? BPH (benign prostatic hyperplasia)   ? Bronchitis   ? hx of  ? Bulge of cervical disc without myelopathy   ? Chest pain   ? a. 06/2015 MV: EF 56%, no ischemia/infarct.  ? Complication of anesthesia 1/85/63  ? no complications but just says he typically needs "more than expected" to anesthetize; needs CPAP (setting 10) in recovery  ? Depression   ? ADD  ? Diabetes mellitus   ? PCP [name], type 2  ? Insomnia   ? Low blood pressure   ? Mitral valve prolapse   ? a. 06/2015 Echo: EF 65-70%, mild LVH, no rwma, mild MVP of ant leaflet and mild to mod posteriorly directed MR, mildly dil La.  ? Obesity   ? PVC's (premature ventricular contractions)   ? Recurrent upper respiratory infection (URI)   ? states Dr Wynelle Link aware- no fever- states is improving  ? Sleep apnea   ? settings 10.6  / followed by [name] study years ago  ? Tremor   ? d/t lithium  ? ? ?Past Surgical History:  ?Procedure Laterality Date  ? CARDIAC CATHETERIZATION    ? 2008 pre op gastric bypass  ? COLONOSCOPY W/ POLYPECTOMY    ? 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 JOINT REPLACEMENT    ? Left, Right Knee, Right hip  ? KNEE ARTHROSCOPY    ? right x 3-4, left x 2  ? KNEE ARTHROTOMY  ~1991  ? right   ? LUMBAR FUSION  04/03/2013  ? [name], L5-S1  ? POLYPECTOMY    ? vocal cords 1992  ? 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  ? Procedure: LEFT TOTAL HIP ARTHROPLASTY ANTERIOR APPROACH;  Surgeon: [name], MD;  [facility];  Service: Orthopedics;  Laterality: Left;  ? TOTAL HIP REVISION  04/22/2011  ? Procedure: TOTAL HIP REVISION;  Surgeon: Gearlean Alf, MD;  [facility]: [facility];  Service: Orthopedics;  Laterality: Right;  ? ? ?There were no vitals filed for this visit. ? ? Subjective Assessment - 06/24/21 0905   ? ? Subjective Patient reports that he feels alright this morning. He notes that he got a little light headed when he first got up this morning.   ? Pertinent History bilateral THA and TKA , history of CVA's, multiple lumbar surgeries   ? Limitations Standing;Walking   ? How long can you stand comfortably? unsteadiness as soon as he stands   ? How long can you walk comfortably? unsteadiness with walking   ? Patient Stated Goals increased strength and balance   ?  Currently in Pain? No/denies   ? ?  ?  ? ?  ? ? ? ? ? ? ? ? ? ? ? ? ? ? ? ? ? ? ? ? [place] Adult PT Treatment/Exercise - 06/24/21 0001   ? ?  ? Knee/Hip Exercises: Aerobic  ? Nustep L5 x 15 minutes   ?  ? Knee/Hip Exercises: Machines for Strengthening  ? Cybex Knee Flexion 30# x 3 minutes   ?  ? Knee/Hip Exercises: Standing  ? Hip Extension Both;Knee straight   2 minutes each; neutral and crossing midline  ? Other Standing Knee Exercises Side stepping   3 minutes  ?  ? Knee/Hip Exercises: Seated  ? Long [employer] Both;Weights   3 minutes  ? Long Arc Quad Weight 3 lbs.   ? Clamshell with TheraBand Red   3 minutes  ? ?  ?  ? ?  ? ? ? ? ? ? ? ? ? ? ? ? ? ? ? PT Long Term Goals - 06/14/21 1258   ? ?  ? PT LONG TERM GOAL #1  ? Title Patient is will be independent with his HEP.   ? Time 5   ? Period Weeks   ? Status New   ? Target Date 07/19/21   ?  ? PT LONG TERM GOAL #2  ? Title Patient will improve his five time sit to stand to 12 seconds or less  for improved safety and reduced fall risk.   ? Time 5   ? Period Weeks   ? Status New   ? Target Date 07/19/21   ?  ? PT LONG TERM GOAL #3  ? Title Patient will be able to demonstrate at least 5 seconds of tandem balance bilaterally for improved stability with narrow base of support.   ? Time 5   ? Period Weeks   ? Status New   ? Target Date 07/19/21   ? ?  ?  ? ?  ? ? ? ? ? ? ? ? Plan - 06/24/21 0934   ? ? Clinical Impression Statement Patient was introduced to standing hip extension crossing midline for improved stability to reduce his fall risk. He required minimal cuing with today's standing interventions for improved foot clearance to reduce his fall risk. He reported feeling tired upon the conclusion of treatment. He continues to require skilled physical therapy to address his remaining impairments to maximize his functional mobility to improve his safety.   ? Personal Factors and Comorbidities Other;Time since onset of injury/illness/exacerbation;Comorbidity 3+   ? Comorbidities PVC, DM, chronic pain   ? Examination-Activity Limitations Locomotion Level;Transfers;Carry;Squat;Stand   ? Examination-Participation Restrictions Cleaning;Community Activity;Shop;Yard Work   ? Stability/Clinical Decision Making Unstable/Unpredictable   ? Rehab Potential Fair   ? PT Frequency 2x / week   ? PT Duration Other (comment)   ? PT Treatment/Interventions ADLs/Self Care Home Management;Cryotherapy;Electrical Stimulation;Moist Heat;Neuromuscular re-education;Balance training;Therapeutic exercise;Therapeutic activities;Functional mobility training;Gait training;Stair training;Patient/family education   ? PT Next Visit Plan nustep, lower extremity strengthening and balance interventions   ? Consulted and Agree with Plan of Care Patient   ? ?  ?  ? ?  ? ? ?Patient will benefit from skilled therapeutic intervention in order to improve the following deficits and impairments:  Abnormal gait, Difficulty walking, Decreased activity  tolerance, Decreased balance, Decreased mobility, Decreased strength, Impaired sensation, Pain ? ?Visit Diagnosis: ?History of falling ? ?Muscle weakness (generalized) ? ? ? ? ?Problem List ?Patient Active Problem  List  ? Diagnosis Date Noted  ? Pre-operative respiratory examination 11/13/2019  ? Ambulatory dysfunction 07/24/2019  ? Closed nondisplaced fracture of condyle of right femur ([place]) 07/24/2019  ? Bulge of cervical disc without myelopathy   ? Falls 07/23/2019  ? Cough 02/20/2019  ? Absolute anemia 02/04/2019  ? Iron deficiency anemia 02/04/2019  ? Abnormal findings on diagnostic imaging of lung 11/27/2018  ? ILD (interstitial lung disease) ([place]) 11/27/2018  ? Mild obesity 08/05/2017  ? Type 2 diabetes mellitus treated without insulin ([place]) 08/05/2017  ? MVP (mitral valve prolapse) 08/05/2017  ? Orthostatic hypotension 01/18/2017  ? Upper airway cough syndrome 11/23/2016  ? Cough variant asthma 11/23/2016  ? 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 Left ventricular hypertrophy 10/03/2015  ? Mixed hyperlipidemia 10/03/2015  ? PVCs (premature ventricular contractions) 07/13/2015  ? Obesity (BMI 30.0-34.9) 07/13/2015  ? Chest pain 06/29/2015  ? Uncontrolled diabetes mellitus without complication, without long-term current use of insulin 06/29/2015  ? Depression 06/29/2015  ? BPH (benign prostatic hyperplasia) 06/29/2015  ? OSA on CPAP 12/26/2006  ? ? ?Darlin Coco, PT ?06/24/2021, 10:05 AM ? ?[place] ?[facility] ?[address] ?[place], [place], [address] ?Phone: [phone]   Fax:  [phone] ? ?Name: [name]. ?MRN: [id number] ?Date of Birth: [birth year] ? ? ? ?

## 2021-06-29 ENCOUNTER — Ambulatory Visit: Payer: Medicare Other | Admitting: *Deleted

## 2021-06-29 DIAGNOSIS — Z9181 History of falling: Secondary | ICD-10-CM | POA: Diagnosis not present

## 2021-06-29 DIAGNOSIS — M6281 Muscle weakness (generalized): Secondary | ICD-10-CM | POA: Diagnosis not present

## 2021-06-29 NOTE — Therapy (Signed)
[place] ?[facility] ?[address] [place] ?[place], [place], [address] ?Phone: [phone]   Fax:  [phone] ? ?Physical Therapy Treatment ? ?Patient Details  ?Name: [name]. ?MRN: [id number] ?Date of Birth: [birth year] ?Referring Provider (PT): Felipa Eth, MD ? ? ?Encounter Date: 06/29/2021 ? ? PT End of Session - 06/29/21 0925   ? ? Visit Number 5   ? Number of Visits 10   ? Date for PT Re-Evaluation 09/10/21   ? PT Start Time 0900   ? PT Stop Time 0949   ? PT Time Calculation (min) 49 min   ? Activity Tolerance Patient tolerated treatment well   ? Behavior During Therapy [facility] for tasks assessed/performed   ? ?  ?  ? ?  ? ? ?Past Medical History:  ?Diagnosis Date  ? Arthritis   ? Asthma   ? anxiety, tree/grass pollen  ? BPH (benign prostatic hyperplasia)   ? Bronchitis   ? hx of  ? Bulge of cervical disc without myelopathy   ? Chest pain   ? a. 06/2015 MV: EF 56%, no ischemia/infarct.  ? Complication of anesthesia 6/56/81  ? no complications but just says he typically needs "more than expected" to anesthetize; needs CPAP (setting 10) in recovery  ? Depression   ? ADD  ? Diabetes mellitus   ? PCP [name], type 2  ? Insomnia   ? Low blood pressure   ? Mitral valve prolapse   ? a. 06/2015 Echo: EF 65-70%, mild LVH, no rwma, mild MVP of ant leaflet and mild to mod posteriorly directed MR, mildly dil La.  ? Obesity   ? PVC's (premature ventricular contractions)   ? Recurrent upper respiratory infection (URI)   ? states Dr Wynelle Link aware- no fever- states is improving  ? Sleep apnea   ? settings 10.6  / followed by [name] study years ago  ? Tremor   ? d/t lithium  ? ? ?Past Surgical History:  ?Procedure Laterality Date  ? CARDIAC CATHETERIZATION    ? 2008 pre op gastric bypass  ? COLONOSCOPY W/ POLYPECTOMY    ? 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 JOINT REPLACEMENT    ? Left, Right Knee, Right hip  ? KNEE ARTHROSCOPY    ? right x 3-4, left x 2  ? KNEE ARTHROTOMY  ~1991  ? right   ? LUMBAR FUSION  04/03/2013  ? [name], L5-S1  ? POLYPECTOMY    ? vocal cords 1992  ? 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  ? Procedure: LEFT TOTAL HIP ARTHROPLASTY ANTERIOR APPROACH;  Surgeon: [name], MD;  Location: [facility];  Service: Orthopedics;  Laterality: Left;  ? TOTAL HIP REVISION  04/22/2011  ? Procedure: TOTAL HIP REVISION;  Surgeon: Gearlean Alf, MD;  Location: [facility];  Service: Orthopedics;  Laterality: Right;  ? ? ?There were no vitals filed for this visit. ? ? Subjective Assessment - 06/29/21 0923   ? ? Subjective Patient reports that he feels alright this morning. Sore from last Rx   ? Pertinent History bilateral THA and TKA , history of CVA's, multiple lumbar surgeries   ? How long can you stand comfortably? unsteadiness as soon as he stands   ? How long can you walk comfortably? unsteadiness with walking   ? Patient Stated Goals increased strength and balance   ? Currently in Pain? Yes   ? Pain Score 4    ? Pain Location  Back   ? Pain Orientation Right   ? Pain Descriptors / Indicators Aching;Sore   ? Pain Type Chronic pain   ? ?  ?  ? ?  ? ? ? ? ? ? ? ? ? ? ? ? ? ? ? ? ? ? ? ? [place] Adult PT Treatment/Exercise - 06/29/21 0001   ? ?  ? Neuro Re-ed   ? Neuro Re-ed Details  Rocker board DF/PF and balance x 6 mins CGA, side stepping on balance beam x 5 mins with CGA and UE assist as needed. Tandem stance x 5 each LE in front/back with CGA. on balance beam   ?  ? Exercises  ? Exercises Knee/Hip   ?  ? Knee/Hip Exercises: Aerobic  ? Nustep L5 x 15 minutes   ?  ? Knee/Hip Exercises: Machines for Strengthening  ? Cybex Knee Extension 20# x 2xfatigue   ? Cybex Knee Flexion 30# x 3 minutes   ? ?  ?  ? ?  ? ? ? ? ? ? ? ? ? ? ? ? ? ? ? PT Long Term Goals - 06/14/21 1258   ? ?  ? PT LONG TERM GOAL #1  ? Title Patient is will be independent with his HEP.   ? Time 5   ? Period Weeks   ? Status New   ? Target Date 07/19/21   ?  ? PT LONG TERM GOAL #2  ? Title Patient will improve his five time sit to stand  to 12 seconds or less for improved safety and reduced fall risk.   ? Time 5   ? Period Weeks   ? Status New   ? Target Date 07/19/21   ?  ? PT LONG TERM GOAL #3  ? Title Patient will be able to demonstrate at least 5 seconds of tandem balance bilaterally for improved stability with narrow base of support.   ? Time 5   ? Period Weeks   ? Status New   ? Target Date 07/19/21   ? ?  ?  ? ?  ? ? ? ? ? ? ? ? Plan - 06/29/21 1505   ? ? Clinical Impression Statement Pt arrived today doing fairly well and reports some soreness from last Rx. Rx focused  on LE strengthening as well as balance and proprioception and did well.   ? Personal Factors and Comorbidities Other;Time since onset of injury/illness/exacerbation;Comorbidity 3+   ? Comorbidities PVC, DM, chronic pain   ? Examination-Activity Limitations Locomotion Level;Transfers;Carry;Squat;Stand   ? Examination-Participation Restrictions Cleaning;Community Activity;Shop;Yard Work   ? Stability/Clinical Decision Making Unstable/Unpredictable   ? Rehab Potential Fair   ? PT Frequency 2x / week   ? PT Duration Other (comment)   ? PT Treatment/Interventions ADLs/Self Care Home Management;Cryotherapy;Electrical Stimulation;Moist Heat;Neuromuscular re-education;Balance training;Therapeutic exercise;Therapeutic activities;Functional mobility training;Gait training;Stair training;Patient/family education   ? PT Next Visit Plan nustep, lower extremity strengthening and balance interventions   ? Consulted and Agree with Plan of Care Patient   ? ?  ?  ? ?  ? ? ?Patient will benefit from skilled therapeutic intervention in order to improve the following deficits and impairments:  Abnormal gait, Difficulty walking, Decreased activity tolerance, Decreased balance, Decreased mobility, Decreased strength, Impaired sensation, Pain ? ?Visit Diagnosis: ?History of falling ? ?Muscle weakness (generalized) ? ? ? ? ?Problem List ?Patient Active Problem List  ? Diagnosis Date Noted  ?  Pre-operative respiratory examination 11/13/2019  ? Ambulatory dysfunction 07/24/2019  ?  Closed nondisplaced fracture of condyle of right femur ([place]) 07/24/2019  ? Bulge of cervical disc without myelopathy   ? Falls 07/23/2019  ? Cough 02/20/2019  ? Absolute anemia 02/04/2019  ? Iron deficiency anemia 02/04/2019  ? Abnormal findings on diagnostic imaging of lung 11/27/2018  ? ILD (interstitial lung disease) ([place]) 11/27/2018  ? Mild obesity 08/05/2017  ? Type 2 diabetes mellitus treated without insulin ([place]) 08/05/2017  ? MVP (mitral valve prolapse) 08/05/2017  ? Orthostatic hypotension 01/18/2017  ? Upper airway cough syndrome 11/23/2016  ? Cough variant asthma 11/23/2016  ? 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 Left ventricular hypertrophy 10/03/2015  ? Mixed hyperlipidemia 10/03/2015  ? PVCs (premature ventricular contractions) 07/13/2015  ? Obesity (BMI 30.0-34.9) 07/13/2015  ? Chest pain 06/29/2015  ? Uncontrolled diabetes mellitus without complication, without long-term current use of insulin 06/29/2015  ? Depression 06/29/2015  ? BPH (benign prostatic hyperplasia) 06/29/2015  ? OSA on CPAP 12/26/2006  ? ? ?Dandy Lazaro,CHRIS, PTA ?06/29/2021, 3:09 PM ? ?[place] ?[facility] ?[place] ?[place], [place], [address] ?Phone: [phone]   Fax:  [phone] ? ?Name: Suraj Ramdass. ?MRN: [id number] ?Date of Birth: 1945/09/24 ? ? ? ?

## 2021-07-01 ENCOUNTER — Ambulatory Visit: Payer: Medicare Other

## 2021-07-01 DIAGNOSIS — Z9181 History of falling: Secondary | ICD-10-CM | POA: Diagnosis not present

## 2021-07-01 DIAGNOSIS — M6281 Muscle weakness (generalized): Secondary | ICD-10-CM

## 2021-07-01 NOTE — Therapy (Signed)
[place] ?[facility] ?[place] ?[place], [place], [address] ?Phone: [phone]   Fax:  [phone] ? ?Physical Therapy Treatment ? ?Patient Details  ?Name: [name]. ?MRN: [id number] ?Date of Birth: 12/03/1945 ?Referring Provider (PT): Felipa Eth, MD ? ? ?Encounter Date: 07/01/2021 ? ? PT End of Session - 07/01/21 0920   ? ? Visit Number 6   ? Number of Visits 10   ? Date for PT Re-Evaluation 09/10/21   ? PT Start Time 0902   ? PT Stop Time 0944   ? PT Time Calculation (min) 42 min   ? Activity Tolerance Patient tolerated treatment well   ? Behavior During Therapy [facility] for tasks assessed/performed   ? ?  ?  ? ?  ? ? ?Past Medical History:  ?Diagnosis Date  ? Arthritis   ? Asthma   ? anxiety, tree/grass pollen  ? BPH (benign prostatic hyperplasia)   ? Bronchitis   ? hx of  ? Bulge of cervical disc without myelopathy   ? Chest pain   ? a. 06/2015 MV: EF 56%, no ischemia/infarct.  ? Complication of anesthesia 06/25/22  ? no complications but just says he typically needs "more than expected" to anesthetize; needs CPAP (setting 10) in recovery  ? Depression   ? ADD  ? Diabetes mellitus   ? PCP [name], type 2  ? Insomnia   ? Low blood pressure   ? Mitral valve prolapse   ? a. 06/2015 Echo: EF 65-70%, mild LVH, no rwma, mild MVP of ant leaflet and mild to mod posteriorly directed MR, mildly dil La.  ? Obesity   ? PVC's (premature ventricular contractions)   ? Recurrent upper respiratory infection (URI)   ? states Dr Wynelle Link aware- no fever- states is improving  ? Sleep apnea   ? settings 10.6  / followed by [name] study years ago  ? Tremor   ? d/t lithium  ? ? ?Past Surgical History:  ?Procedure Laterality Date  ? CARDIAC CATHETERIZATION    ? 2008 pre op gastric bypass  ? COLONOSCOPY W/ POLYPECTOMY    ? 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 JOINT REPLACEMENT    ? Left, Right Knee, Right hip  ? KNEE ARTHROSCOPY    ? right x 3-4, left x 2  ? KNEE ARTHROTOMY  ~1991  ? right   ? LUMBAR FUSION  04/03/2013  ? [name], L5-S1  ? POLYPECTOMY    ? vocal cords 1992  ? 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  ? Procedure: LEFT TOTAL HIP ARTHROPLASTY ANTERIOR APPROACH;  Surgeon: [name], MD;  [facility];  Service: Orthopedics;  Laterality: Left;  ? TOTAL HIP REVISION  04/22/2011  ? Procedure: TOTAL HIP REVISION;  Surgeon: Gearlean Alf, MD;  [facility];  Service: Orthopedics;  Laterality: Right;  ? ? ?There were no vitals filed for this visit. ? ? Subjective Assessment - 07/01/21 0919   ? ? Subjective Patient reports that he was really sore in his quads after his last appointment.   ? Pertinent History bilateral THA and TKA , history of CVA's, multiple lumbar surgeries   ? How long can you stand comfortably? unsteadiness as soon as he stands   ? How long can you walk comfortably? unsteadiness with walking   ? Patient Stated Goals increased strength and balance   ? Currently in Pain? Yes   ? Pain Score 7    ?  Pain Location Leg   ? Pain Orientation Right;Left   ? Pain Descriptors / Indicators Sore   ? ?  ?  ? ?  ? ? ? ? ? ? ? ? ? ? ? ? ? ? ? ? ? ? ? ? [facility] Adult PT Treatment/Exercise - 07/01/21 0001   ? ?  ? Knee/Hip Exercises: Aerobic  ? Nustep L5 x 15 minutes   ?  ? Knee/Hip Exercises: Machines for Strengthening  ? Cybex Knee Extension 20# x 3 minutes   ? Cybex Knee Flexion 30# x 3 minutes   ?  ? Knee/Hip Exercises: Standing  ? Rocker Board 5 minutes   ? ?  ?  ? ?  ? ? ? ? ? ? Balance Exercises - 07/01/21 0001   ? ?  ? Balance Exercises: Standing  ? Sidestepping Upper extremity support;Foam/compliant support   1-2 HHA; 3 minutes  ? ?  ?  ? ?  ? ? ? ? ? ? ? ? ? ? PT Long Term Goals - 06/14/21 1258   ? ?  ? PT LONG TERM GOAL #1  ? Title Patient is will be independent with his HEP.   ? Time 5   ? Period Weeks   ? Status New   ? Target Date 07/19/21   ?  ? PT LONG TERM GOAL #2  ? Title Patient will improve his five time sit to stand to 12 seconds or less for improved safety and reduced  fall risk.   ? Time 5   ? Period Weeks   ? Status New   ? Target Date 07/19/21   ?  ? PT LONG TERM GOAL #3  ? Title Patient will be able to demonstrate at least 5 seconds of tandem balance bilaterally for improved stability with narrow base of support.   ? Time 5   ? Period Weeks   ? Status New   ? Target Date 07/19/21   ? ?  ?  ? ?  ? ? ? ? ? ? ? ? Plan - 07/01/21 0922   ? ? Clinical Impression Statement Patient presented to treatment with increased bilateral lower extremity soreness. His interventions were modified due to this fatigue. He required moderate cuing with today's interventions for proper pacing to avoid a significant increase in lower extremity fatigue while increasing muscular demand. He required multiple seated rest breaks throughout treatment due to soreness and fatigue. He reported feeling tired upon the conclusion of treatment. He continues to require skilled physical therapy to address his remaining impairments to maximize his safety and functional mobiltiy.   ? Personal Factors and Comorbidities Other;Time since onset of injury/illness/exacerbation;Comorbidity 3+   ? Comorbidities PVC, DM, chronic pain   ? Examination-Activity Limitations Locomotion Level;Transfers;Carry;Squat;Stand   ? Examination-Participation Restrictions Cleaning;Community Activity;Shop;Yard Work   ? Stability/Clinical Decision Making Unstable/Unpredictable   ? Rehab Potential Fair   ? PT Frequency 2x / week   ? PT Duration Other (comment)   ? PT Treatment/Interventions ADLs/Self Care Home Management;Cryotherapy;Electrical Stimulation;Moist Heat;Neuromuscular re-education;Balance training;Therapeutic exercise;Therapeutic activities;Functional mobility training;Gait training;Stair training;Patient/family education   ? PT Next Visit Plan nustep, lower extremity strengthening and balance interventions   ? Consulted and Agree with Plan of Care Patient   ? ?  ?  ? ?  ? ? ?Patient will benefit from skilled therapeutic intervention  in order to improve the following deficits and impairments:  Abnormal gait, Difficulty walking, Decreased activity tolerance, Decreased balance, Decreased mobility, Decreased strength,  Impaired sensation, Pain ? ?Visit Diagnosis: ?History of falling ? ?Muscle weakness (generalized) ? ? ? ? ?Problem List ?Patient Active Problem List  ? Diagnosis Date Noted  ? Pre-operative respiratory examination 11/13/2019  ? Ambulatory dysfunction 07/24/2019  ? Closed nondisplaced fracture of condyle of right femur ([place]) 07/24/2019  ? Bulge of cervical disc without myelopathy   ? Falls 07/23/2019  ? Cough 02/20/2019  ? Absolute anemia 02/04/2019  ? Iron deficiency anemia 02/04/2019  ? Abnormal findings on diagnostic imaging of lung 11/27/2018  ? ILD (interstitial lung disease) ([place]) 11/27/2018  ? Mild obesity 08/05/2017  ? Type 2 diabetes mellitus treated without insulin ([place]) 08/05/2017  ? MVP (mitral valve prolapse) 08/05/2017  ? Orthostatic hypotension 01/18/2017  ? Upper airway cough syndrome 11/23/2016  ? Cough variant asthma 11/23/2016  ? 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 Left ventricular hypertrophy 10/03/2015  ? Mixed hyperlipidemia 10/03/2015  ? PVCs (premature ventricular contractions) 07/13/2015  ? Obesity (BMI 30.0-34.9) 07/13/2015  ? Chest pain 06/29/2015  ? Uncontrolled diabetes mellitus without complication, without long-term current use of insulin 06/29/2015  ? Depression 06/29/2015  ? BPH (benign prostatic hyperplasia) 06/29/2015  ? OSA on CPAP 12/26/2006  ? ? ?Darlin Coco, PT ?07/01/2021, 10:48 AM ? ?[place] ?[facility] ?[address] ?[place], [place], [address] ?Phone: [phone]   Fax:  [phone] ? ?Name: Ovila Lepage. ?MRN: [id number] ?Date of Birth: 1945-08-02 ? ? ? ?

## 2021-07-05 DIAGNOSIS — Z23 Encounter for immunization: Secondary | ICD-10-CM | POA: Diagnosis not present

## 2021-07-06 ENCOUNTER — Ambulatory Visit: Payer: Medicare Other | Admitting: *Deleted

## 2021-07-08 ENCOUNTER — Ambulatory Visit: Payer: Medicare Other

## 2021-07-08 DIAGNOSIS — M6281 Muscle weakness (generalized): Secondary | ICD-10-CM | POA: Diagnosis not present

## 2021-07-08 DIAGNOSIS — Z9181 History of falling: Secondary | ICD-10-CM | POA: Diagnosis not present

## 2021-07-08 NOTE — Therapy (Signed)
[place] ?[facility] ?[place] ?[place], [place], [address] ?Phone: [phone]   Fax:  [phone] ? ?Physical Therapy Treatment ? ?Patient Details  ?Name: [name]. ?MRN: [id number] ?Date of Birth: 1945-05-12 ?Referring Provider (PT): Felipa Eth, MD ? ? ?Encounter Date: 07/08/2021 ? ? PT End of Session - 07/08/21 0954   ? ? Visit Number 7   ? Number of Visits 10   ? Date for PT Re-Evaluation 09/10/21   ? PT Start Time [phone]   ? PT Stop Time 1030   ? PT Time Calculation (min) 38 min   ? Activity Tolerance Patient tolerated treatment well   ? Behavior During Therapy [facility] for tasks assessed/performed   ? ?  ?  ? ?  ? ? ?Past Medical History:  ?Diagnosis Date  ? Arthritis   ? Asthma   ? anxiety, tree/grass pollen  ? BPH (benign prostatic hyperplasia)   ? Bronchitis   ? hx of  ? Bulge of cervical disc without myelopathy   ? Chest pain   ? a. 06/2015 MV: EF 56%, no ischemia/infarct.  ? Complication of anesthesia 07/01/35  ? no complications but just says he typically needs "more than expected" to anesthetize; needs CPAP (setting 10) in recovery  ? Depression   ? ADD  ? Diabetes mellitus   ? PCP [name], type 2  ? Insomnia   ? Low blood pressure   ? Mitral valve prolapse   ? a. 06/2015 Echo: EF 65-70%, mild LVH, no rwma, mild MVP of ant leaflet and mild to mod posteriorly directed MR, mildly dil La.  ? Obesity   ? PVC's (premature ventricular contractions)   ? Recurrent upper respiratory infection (URI)   ? states Dr Wynelle Link aware- no fever- states is improving  ? Sleep apnea   ? settings 10.6  / followed by [name] study years ago  ? Tremor   ? d/t lithium  ? ? ?Past Surgical History:  ?Procedure Laterality Date  ? CARDIAC CATHETERIZATION    ? 2008 pre op gastric bypass  ? COLONOSCOPY W/ POLYPECTOMY    ? 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 JOINT REPLACEMENT    ? Left, Right Knee, Right hip  ? KNEE ARTHROSCOPY    ? right x 3-4, left x 2  ? KNEE ARTHROTOMY  ~1991  ? right   ? LUMBAR FUSION  04/03/2013  ? [name], L5-S1  ? POLYPECTOMY    ? vocal cords 1992  ? 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  ? Procedure: LEFT TOTAL HIP ARTHROPLASTY ANTERIOR APPROACH;  Surgeon: [name], MD;  [facility];  Service: Orthopedics;  Laterality: Left;  ? TOTAL HIP REVISION  04/22/2011  ? Procedure: TOTAL HIP REVISION;  Surgeon: Gearlean Alf, MD;  [facility];  Service: Orthopedics;  Laterality: Right;  ? ? ?There were no vitals filed for this visit. ? ? Subjective Assessment - 07/08/21 0953   ? ? Subjective Pt arrives for today's treatment session reporting 5/10 B quad soreness.   ? Pertinent History bilateral THA and TKA , history of CVA's, multiple lumbar surgeries   ? How long can you stand comfortably? unsteadiness as soon as he stands   ? How long can you walk comfortably? unsteadiness with walking   ? Patient Stated Goals increased strength and balance   ? Currently in Pain? Yes   ? Pain Score 5    ? Pain Location Leg   ?  Pain Orientation Right;Left   ? ?  ?  ? ?  ? ? ? ? ? ? ? ? ? ? ? ? ? ? ? ? ? ? ? ? [place] Adult PT Treatment/Exercise - 07/08/21 0001   ? ?  ? Knee/Hip Exercises: Aerobic  ? Nustep Lvl 5 x 15 mins   ?  ? Knee/Hip Exercises: Machines for Strengthening  ? Cybex Knee Extension 20# x 3 minutes   ? Cybex Knee Flexion 30# x 3 minutes   ? ?  ?  ? ?  ? ? ? ? ? ? Balance Exercises - 07/08/21 0001   ? ?  ? Balance Exercises: Standing  ? Rockerboard Anterior/posterior;Intermittent UE support   4 mins  ? Step Ups Forward;6 inch;UE support 2   20 reps  ? Heel Raises Both;20 reps   ? Toe Raise Both;20 reps   ? ?  ?  ? ?  ? ? ? ? ? ? ? ? ? ? PT Long Term Goals - 06/14/21 1258   ? ?  ? PT LONG TERM GOAL #1  ? Title Patient is will be independent with his HEP.   ? Time 5   ? Period Weeks   ? Status New   ? Target Date 07/19/21   ?  ? PT LONG TERM GOAL #2  ? Title Patient will improve his five time sit to stand to 12 seconds or less for improved safety and reduced fall risk.   ? Time  5   ? Period Weeks   ? Status New   ? Target Date 07/19/21   ?  ? PT LONG TERM GOAL #3  ? Title Patient will be able to demonstrate at least 5 seconds of tandem balance bilaterally for improved stability with narrow base of support.   ? Time 5   ? Period Weeks   ? Status New   ? Target Date 07/19/21   ? ?  ?  ? ?  ? ? ? ? ? ? ? ? Plan - 07/08/21 0954   ? ? Clinical Impression Statement Pt arrives for today's treatment session reporting 5/10 B quad soreness upon arriving for today's treatment session.  Pt arrives for today's treatment session 6 mins late today.  Pt states that his left LE pain currently radiates to his knee versus to his ankle as it did previously.  Pt requiring intermittent CGA with balance activites for safety.  Pt reported 5/10 B quad pain at completion of today's treatment session with increased fatigue.   ? Personal Factors and Comorbidities Other;Time since onset of injury/illness/exacerbation;Comorbidity 3+   ? Comorbidities PVC, DM, chronic pain   ? Examination-Activity Limitations Locomotion Level;Transfers;Carry;Squat;Stand   ? Examination-Participation Restrictions Cleaning;Community Activity;Shop;Yard Work   ? Stability/Clinical Decision Making Unstable/Unpredictable   ? Rehab Potential Fair   ? PT Frequency 2x / week   ? PT Duration Other (comment)   ? PT Treatment/Interventions ADLs/Self Care Home Management;Cryotherapy;Electrical Stimulation;Moist Heat;Neuromuscular re-education;Balance training;Therapeutic exercise;Therapeutic activities;Functional mobility training;Gait training;Stair training;Patient/family education   ? PT Next Visit Plan nustep, lower extremity strengthening and balance interventions   ? Consulted and Agree with Plan of Care Patient   ? ?  ?  ? ?  ? ? ?Patient will benefit from skilled therapeutic intervention in order to improve the following deficits and impairments:  Abnormal gait, Difficulty walking, Decreased activity tolerance, Decreased balance, Decreased  mobility, Decreased strength, Impaired sensation, Pain ? ?Visit Diagnosis: ?History of falling ? ?Muscle  weakness (generalized) ? ? ? ? ?Problem List ?Patient Active Problem List  ? Diagnosis Date Noted  ? Pre-operative respiratory examination 11/13/2019  ? Ambulatory dysfunction 07/24/2019  ? Closed nondisplaced fracture of condyle of right femur ([place]) 07/24/2019  ? Bulge of cervical disc without myelopathy   ? Falls 07/23/2019  ? Cough 02/20/2019  ? Absolute anemia 02/04/2019  ? Iron deficiency anemia 02/04/2019  ? Abnormal findings on diagnostic imaging of lung 11/27/2018  ? ILD (interstitial lung disease) ([place]) 11/27/2018  ? Mild obesity 08/05/2017  ? Type 2 diabetes mellitus treated without insulin ([place]) 08/05/2017  ? MVP (mitral valve prolapse) 08/05/2017  ? Orthostatic hypotension 01/18/2017  ? Upper airway cough syndrome 11/23/2016  ? Cough variant asthma 11/23/2016  ? 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 Left ventricular hypertrophy 10/03/2015  ? Mixed hyperlipidemia 10/03/2015  ? PVCs (premature ventricular contractions) 07/13/2015  ? Obesity (BMI 30.0-34.9) 07/13/2015  ? Chest pain 06/29/2015  ? Uncontrolled diabetes mellitus without complication, without long-term current use of insulin 06/29/2015  ? Depression 06/29/2015  ? BPH (benign prostatic hyperplasia) 06/29/2015  ? OSA on CPAP 12/26/2006  ? ? ?Kathrynn Ducking, PTA ?07/08/2021, 10:47 AM ? ?[place] ?[facility] ?[place] ?[place], [place], [address] ?Phone: [phone]   Fax:  [phone] ? ?Name: [name]. ?MRN: [id number] ?Date of Birth: 07-24-1945 ? ? ? ?

## 2021-07-13 ENCOUNTER — Ambulatory Visit: Payer: Medicare Other | Attending: Geriatric Medicine | Admitting: *Deleted

## 2021-07-13 DIAGNOSIS — Z9181 History of falling: Secondary | ICD-10-CM | POA: Diagnosis not present

## 2021-07-13 DIAGNOSIS — M6281 Muscle weakness (generalized): Secondary | ICD-10-CM | POA: Insufficient documentation

## 2021-07-13 NOTE — Therapy (Signed)
[place] ?[facility] ?[address] ?[place], [place], [address] ?Phone: [phone]   Fax:  [phone] ? ?Physical Therapy Treatment ? ?Patient Details  ?Name: [name]. ?MRN: [id number] ?Date of Birth: 1945-07-14 ?Referring Provider (PT): Felipa Eth, MD ? ? ?Encounter Date: 07/13/2021 ? ? PT End of Session - 07/13/21 1003   ? ? Visit Number 8   ? Date for PT Re-Evaluation 09/10/21   ? PT Start Time 0973   ? PT Stop Time 1032   ? PT Time Calculation (min) 47 min   ? ?  ?  ? ?  ? ? ?Past Medical History:  ?Diagnosis Date  ? Arthritis   ? Asthma   ? anxiety, tree/grass pollen  ? BPH (benign prostatic hyperplasia)   ? Bronchitis   ? hx of  ? Bulge of cervical disc without myelopathy   ? Chest pain   ? a. 06/2015 MV: EF 56%, no ischemia/infarct.  ? Complication of anesthesia 5/32/99  ? no complications but just says he typically needs "more than expected" to anesthetize; needs CPAP (setting 10) in recovery  ? Depression   ? ADD  ? Diabetes mellitus   ? PCP [name], type 2  ? Insomnia   ? Low blood pressure   ? Mitral valve prolapse   ? a. 06/2015 Echo: EF 65-70%, mild LVH, no rwma, mild MVP of ant leaflet and mild to mod posteriorly directed MR, mildly dil La.  ? Obesity   ? PVC's (premature ventricular contractions)   ? Recurrent upper respiratory infection (URI)   ? states Dr Wynelle Link aware- no fever- states is improving  ? Sleep apnea   ? settings 10.6  / followed by [name] study years ago  ? Tremor   ? d/t lithium  ? ? ?Past Surgical History:  ?Procedure Laterality Date  ? CARDIAC CATHETERIZATION    ? 2008 pre op gastric bypass  ? COLONOSCOPY W/ POLYPECTOMY    ? 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 JOINT REPLACEMENT    ? Left, Right Knee, Right hip  ? KNEE ARTHROSCOPY    ? right x 3-4, left x 2  ? KNEE ARTHROTOMY  ~1991  ? right  ? LUMBAR FUSION  04/03/2013  ? [name], L5-S1  ? POLYPECTOMY    ? vocal cords 1992  ? 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  ? Procedure: LEFT  TOTAL HIP ARTHROPLASTY ANTERIOR APPROACH;  Surgeon: [name], MD;  Location: [facility];  Service: Orthopedics;  Laterality: Left;  ? TOTAL HIP REVISION  04/22/2011  ? Procedure: TOTAL HIP REVISION;  Surgeon: Gearlean Alf, MD;  Location: [facility];  Service: Orthopedics;  Laterality: Right;  ? ? ?There were no vitals filed for this visit. ? ? Subjective Assessment - 07/13/21 1001   ? ? Subjective Pt arrived today reporting walking better since starting PT   ? Pertinent History bilateral THA and TKA , history of CVA's, multiple lumbar surgeries   ? Limitations Standing;Walking   ? How long can you stand comfortably? unsteadiness as soon as he stands   ? How long can you walk comfortably? unsteadiness with walking   ? Patient Stated Goals increased strength and balance   ? Currently in Pain? Yes   ? Pain Score 2    ? Pain Location Leg   ? Pain Orientation Right;Left   ? Pain Descriptors / Indicators Sore   ? Pain Type Chronic pain   ? ?  ?  ? ?  ? ? ? ? ? ? ? ? ? ? ? ? ? ? ? ? ? ? ? ?  [facility] Adult PT Treatment/Exercise - 07/13/21 0001   ? ?  ? Knee/Hip Exercises: Aerobic  ? Nustep Lvl 5 x 15 mins   ?  ? Knee/Hip Exercises: Machines for Strengthening  ? Cybex Knee Extension 20# 2x 30   ? Cybex Knee Flexion 30#2 x 30   ? ?  ?  ? ?  ? ? ? ? ? ? Balance Exercises - 07/13/21 0001   ? ?  ? Balance Exercises: Standing  ? Rockerboard Anterior/posterior;Intermittent UE support   5 mins  ? Step Ups Forward;6 inch;UE support 2   20 reps x 2  ? Balance Beam x 5 mins   ? ?  ?  ? ?  ? ? ? ? ? ? ? ? ? ? PT Long Term Goals - 06/14/21 1258   ? ?  ? PT LONG TERM GOAL #1  ? Title Patient is will be independent with his HEP.   ? Time 5   ? Period Weeks   ? Status New   ? Target Date 07/19/21   ?  ? PT LONG TERM GOAL #2  ? Title Patient will improve his five time sit to stand to 12 seconds or less for improved safety and reduced fall risk.   ? Time 5   ? Period Weeks   ? Status New   ? Target Date 07/19/21   ?  ? PT LONG TERM GOAL #3  ? Title  Patient will be able to demonstrate at least 5 seconds of tandem balance bilaterally for improved stability with narrow base of support.   ? Time 5   ? Period Weeks   ? Status New   ? Target Date 07/19/21   ? ?  ?  ? ?  ? ? ? ? ? ? ? ? Plan - 07/13/21 1004   ? ? Clinical Impression Statement Pt arrived today doing better overall and reports that he is walking better with improved balance. Rx focused on LE strengthening and balance/proprioception activities. Pt requiring less assistance now with balance activities especially noted on the balance beam. Good progress.  2 visits left.   ? Personal Factors and Comorbidities Other;Time since onset of injury/illness/exacerbation;Comorbidity 3+   ? Comorbidities PVC, DM, chronic pain   ? Examination-Activity Limitations Locomotion Level;Transfers;Carry;Squat;Stand   ? Examination-Participation Restrictions Cleaning;Community Activity;Shop;Yard Work   ? Stability/Clinical Decision Making Unstable/Unpredictable   ? Rehab Potential Fair   ? PT Frequency 2x / week   ? PT Duration Other (comment)   ? PT Treatment/Interventions ADLs/Self Care Home Management;Cryotherapy;Electrical Stimulation;Moist Heat;Neuromuscular re-education;Balance training;Therapeutic exercise;Therapeutic activities;Functional mobility training;Gait training;Stair training;Patient/family education   ? PT Next Visit Plan nustep, lower extremity strengthening and balance interventions   ? ?  ?  ? ?  ? ? ?Patient will benefit from skilled therapeutic intervention in order to improve the following deficits and impairments:  Abnormal gait, Difficulty walking, Decreased activity tolerance, Decreased balance, Decreased mobility, Decreased strength, Impaired sensation, Pain ? ?Visit Diagnosis: ?History of falling ? ?Muscle weakness (generalized) ? ? ? ? ?Problem List ?Patient Active Problem List  ? Diagnosis Date Noted  ? Pre-operative respiratory examination 11/13/2019  ? Ambulatory dysfunction 07/24/2019  ?  Closed nondisplaced fracture of condyle of right femur ([place]) 07/24/2019  ? Bulge of cervical disc without myelopathy   ? Falls 07/23/2019  ? Cough 02/20/2019  ? Absolute anemia 02/04/2019  ? Iron deficiency anemia 02/04/2019  ? Abnormal findings on diagnostic imaging of lung 11/27/2018  ? ILD (  interstitial lung disease) ([place]) 11/27/2018  ? Mild obesity 08/05/2017  ? Type 2 diabetes mellitus treated without insulin ([place]) 08/05/2017  ? MVP (mitral valve prolapse) 08/05/2017  ? Orthostatic hypotension 01/18/2017  ? Upper airway cough syndrome 11/23/2016  ? Cough variant asthma 11/23/2016  ? 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 Left ventricular hypertrophy 10/03/2015  ? Mixed hyperlipidemia 10/03/2015  ? PVCs (premature ventricular contractions) 07/13/2015  ? Obesity (BMI 30.0-34.9) 07/13/2015  ? Chest pain 06/29/2015  ? Uncontrolled diabetes mellitus without complication, without long-term current use of insulin 06/29/2015  ? Depression 06/29/2015  ? BPH (benign prostatic hyperplasia) 06/29/2015  ? OSA on CPAP 12/26/2006  ? ? ?[name],[name], PTA ?07/13/2021, 10:57 AM ? ?[place] ?[facility] ?[address] ?[place], [place], [address] ?Phone: [phone]   Fax:  [phone] ? ?Name: Christie Copley. ?MRN: [id number] ?Date of Birth: [birth year] ? ? ? ?

## 2021-07-15 ENCOUNTER — Ambulatory Visit: Payer: Medicare Other | Admitting: *Deleted

## 2021-07-15 DIAGNOSIS — M6281 Muscle weakness (generalized): Secondary | ICD-10-CM | POA: Diagnosis not present

## 2021-07-15 DIAGNOSIS — Z9181 History of falling: Secondary | ICD-10-CM | POA: Diagnosis not present

## 2021-07-15 DIAGNOSIS — Z20822 Contact with and (suspected) exposure to covid-19: Secondary | ICD-10-CM | POA: Diagnosis not present

## 2021-07-15 NOTE — Therapy (Signed)
[place] ?[facility] ?[address] ?[place], [place], [address] ?Phone: [phone]   Fax:  [phone] ? ?Physical Therapy Treatment ? ?Patient Details  ?Name: [name]. ?MRN: [id number] ?Date of Birth: 07-26-1945 ?Referring Provider (PT): Felipa Eth, MD ? ? ?Encounter Date: 07/15/2021 ? ? PT End of Session - 07/15/21 1018   ? ? Visit Number 9   ? Number of Visits 10   ? Date for PT Re-Evaluation 09/10/21   ? PT Start Time 6226   ? PT Stop Time 1035   ? PT Time Calculation (min) 50 min   ? ?  ?  ? ?  ? ? ?Past Medical History:  ?Diagnosis Date  ? Arthritis   ? Asthma   ? anxiety, tree/grass pollen  ? BPH (benign prostatic hyperplasia)   ? Bronchitis   ? hx of  ? Bulge of cervical disc without myelopathy   ? Chest pain   ? a. 06/2015 MV: EF 56%, no ischemia/infarct.  ? Complication of anesthesia 3/33/54  ? no complications but just says he typically needs "more than expected" to anesthetize; needs CPAP (setting 10) in recovery  ? Depression   ? ADD  ? Diabetes mellitus   ? PCP [name], type 2  ? Insomnia   ? Low blood pressure   ? Mitral valve prolapse   ? a. 06/2015 Echo: EF 65-70%, mild LVH, no rwma, mild MVP of ant leaflet and mild to mod posteriorly directed MR, mildly dil La.  ? Obesity   ? PVC's (premature ventricular contractions)   ? Recurrent upper respiratory infection (URI)   ? states Dr Wynelle Link aware- no fever- states is improving  ? Sleep apnea   ? settings 10.6  / followed by [name] study years ago  ? Tremor   ? d/t lithium  ? ? ?Past Surgical History:  ?Procedure Laterality Date  ? CARDIAC CATHETERIZATION    ? 2008 pre op gastric bypass  ? COLONOSCOPY W/ POLYPECTOMY    ? 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 JOINT REPLACEMENT    ? Left, Right Knee, Right hip  ? KNEE ARTHROSCOPY    ? right x 3-4, left x 2  ? KNEE ARTHROTOMY  ~1991  ? right  ? LUMBAR FUSION  04/03/2013  ? [name], L5-S1  ? POLYPECTOMY    ? vocal cords 1992  ? TOTAL HIP ARTHROPLASTY Left  10/21/2014  ? Procedure: LEFT TOTAL HIP ARTHROPLASTY ANTERIOR APPROACH;  Surgeon: [name], MD;  Location: [facility];  Service: Orthopedics;  Laterality: Left;  ? TOTAL HIP REVISION  04/22/2011  ? Procedure: TOTAL HIP REVISION;  Surgeon: Gearlean Alf, MD;  Location: [facility];  Service: Orthopedics;  Laterality: Right;  ? ? ?There were no vitals filed for this visit. ? ? Subjective Assessment - 07/15/21 1017   ? ? Subjective Pt arrived today reporting walking better since starting PT and not tripping now   ? Pertinent History bilateral THA and TKA , history of CVA's, multiple lumbar surgeries   ? Limitations Standing;Walking   ? How long can you stand comfortably? unsteadiness as soon as he stands   ? How long can you walk comfortably? unsteadiness with walking   ? Patient Stated Goals increased strength and balance   ? Currently in Pain? Yes   ? Pain Score 2    ? Pain Location Leg   ? Pain Orientation Right;Left   ? Pain Descriptors /  Indicators Sore   ? ?  ?  ? ?  ? ? ? ? ? ? ? ? ? ? ? ? ? ? ? ? ? ? ? ? [facility] Adult PT Treatment/Exercise - 07/15/21 0001   ? ?  ? Knee/Hip Exercises: Aerobic  ? Nustep Lvl 5 x 15 mins   ?  ? Knee/Hip Exercises: Machines for Strengthening  ? Cybex Knee Extension 20# 2x 30   ? Cybex Knee Flexion 30#2 x 30   ? ?  ?  ? ?  ? ? ? ? ? ? Balance Exercises - 07/15/21 0001   ? ?  ? Balance Exercises: Standing  ? Rockerboard Anterior/posterior;Intermittent UE support   5 mins  ? Step Ups Forward;6 inch;UE support 2   20 reps x 2  ? Balance Beam x 5 mins side/side   ? ?  ?  ? ?  ? ? ? ? ? ? ? ? ? ? PT Long Term Goals - 06/14/21 1258   ? ?  ? PT LONG TERM GOAL #1  ? Title Patient is will be independent with his HEP.   ? Time 5   ? Period Weeks   ? Status New   ? Target Date 07/19/21   ?  ? PT LONG TERM GOAL #2  ? Title Patient will improve his five time sit to stand to 12 seconds or less for improved safety and reduced fall risk.   ? Time 5   ? Period Weeks   ? Status New   ? Target Date 07/19/21   ?   ? PT LONG TERM GOAL #3  ? Title Patient will be able to demonstrate at least 5 seconds of tandem balance bilaterally for improved stability with narrow base of support.   ? Time 5   ? Period Weeks   ? Status New   ? Target Date 07/19/21   ? ?  ?  ? ?  ? ? ? ? ? ? ? ? Plan - 07/15/21 1428   ? ? Clinical Impression Statement Pt continues to progress with LE strengthening exs as well as with balance ans proprioception Act.'s. DC after next visit   ? Personal Factors and Comorbidities Other;Time since onset of injury/illness/exacerbation;Comorbidity 3+   ? Comorbidities PVC, DM, chronic pain   ? Examination-Activity Limitations Locomotion Level;Transfers;Carry;Squat;Stand   ? Examination-Participation Restrictions Cleaning;Community Activity;Shop;Yard Work   ? Stability/Clinical Decision Making Unstable/Unpredictable   ? Rehab Potential Fair   ? PT Frequency 2x / week   ? PT Duration Other (comment)   ? PT Treatment/Interventions ADLs/Self Care Home Management;Cryotherapy;Electrical Stimulation;Moist Heat;Neuromuscular re-education;Balance training;Therapeutic exercise;Therapeutic activities;Functional mobility training;Gait training;Stair training;Patient/family education   ? PT Next Visit Plan check LTGs and DC   ? Consulted and Agree with Plan of Care Patient   ? ?  ?  ? ?  ? ? ?Patient will benefit from skilled therapeutic intervention in order to improve the following deficits and impairments:  Abnormal gait, Difficulty walking, Decreased activity tolerance, Decreased balance, Decreased mobility, Decreased strength, Impaired sensation, Pain ? ?Visit Diagnosis: ?History of falling ? ?Muscle weakness (generalized) ? ? ? ? ?Problem List ?Patient Active Problem List  ? Diagnosis Date Noted  ? Pre-operative respiratory examination 11/13/2019  ? Ambulatory dysfunction 07/24/2019  ? Closed nondisplaced fracture of condyle of right femur ([place]) 07/24/2019  ? Bulge of cervical disc without myelopathy   ? Falls 07/23/2019  ?  Cough 02/20/2019  ? Absolute anemia 02/04/2019  ?  Iron deficiency anemia 02/04/2019  ? Abnormal findings on diagnostic imaging of lung 11/27/2018  ? ILD (interstitial lung disease) ([place]) 11/27/2018  ? Mild obesity 08/05/2017  ? Type 2 diabetes mellitus treated without insulin ([place]) 08/05/2017  ? MVP (mitral valve prolapse) 08/05/2017  ? Orthostatic hypotension 01/18/2017  ? Upper airway cough syndrome 11/23/2016  ? Cough variant asthma 11/23/2016  ? 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 Left ventricular hypertrophy 10/03/2015  ? Mixed hyperlipidemia 10/03/2015  ? PVCs (premature ventricular contractions) 07/13/2015  ? Obesity (BMI 30.0-34.9) 07/13/2015  ? Chest pain 06/29/2015  ? Uncontrolled diabetes mellitus without complication, without long-term current use of insulin 06/29/2015  ? Depression 06/29/2015  ? BPH (benign prostatic hyperplasia) 06/29/2015  ? OSA on CPAP 12/26/2006  ? ? ?Knolan Simien,CHRIS, PTA ?07/15/2021, 5:34 PM ? ?[place] ?[facility] ?[address] ?[place], [place], [address] ?Phone: [phone]   Fax:  [phone] ? ?Name: [name]. ?MRN: [id number] ?Date of Birth: [birth year] ? ? ? ?

## 2021-07-20 ENCOUNTER — Ambulatory Visit: Payer: Medicare Other | Admitting: *Deleted

## 2021-07-20 DIAGNOSIS — M6281 Muscle weakness (generalized): Secondary | ICD-10-CM | POA: Diagnosis not present

## 2021-07-20 DIAGNOSIS — Z9181 History of falling: Secondary | ICD-10-CM

## 2021-07-20 NOTE — Therapy (Signed)
[place] ?[facility] ?[address] ?[place], [place], [address] ?Phone: [phone]   Fax:  [phone] ? ?Physical Therapy Treatment ? ?Patient Details  ?Name: [name]. ?MRN: [id number] ?Date of Birth: 1945-06-08 ?Referring Provider (PT): Felipa Eth, MD ? ? ?Encounter Date: 07/20/2021 ? ? PT End of Session - 07/20/21 1107   ? ? Visit Number 10   ? Number of Visits 10   ? Date for PT Re-Evaluation 09/10/21   ? PT Start Time 1030   ? PT Stop Time 1120   ? PT Time Calculation (min) 50 min   ? ?  ?  ? ?  ? ? ?Past Medical History:  ?Diagnosis Date  ? Arthritis   ? Asthma   ? anxiety, tree/grass pollen  ? BPH (benign prostatic hyperplasia)   ? Bronchitis   ? hx of  ? Bulge of cervical disc without myelopathy   ? Chest pain   ? a. 06/2015 MV: EF 56%, no ischemia/infarct.  ? Complication of anesthesia 09/15/86  ? no complications but just says he typically needs "more than expected" to anesthetize; needs CPAP (setting 10) in recovery  ? Depression   ? ADD  ? Diabetes mellitus   ? PCP [name], type 2  ? Insomnia   ? Low blood pressure   ? Mitral valve prolapse   ? a. 06/2015 Echo: EF 65-70%, mild LVH, no rwma, mild MVP of ant leaflet and mild to mod posteriorly directed MR, mildly dil La.  ? Obesity   ? PVC's (premature ventricular contractions)   ? Recurrent upper respiratory infection (URI)   ? states Dr Wynelle Link aware- no fever- states is improving  ? Sleep apnea   ? settings 10.6  / followed by [name] study years ago  ? Tremor   ? d/t lithium  ? ? ?Past Surgical History:  ?Procedure Laterality Date  ? CARDIAC CATHETERIZATION    ? 2008 pre op gastric bypass  ? COLONOSCOPY W/ POLYPECTOMY    ? 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 JOINT REPLACEMENT    ? Left, Right Knee, Right hip  ? KNEE ARTHROSCOPY    ? right x 3-4, left x 2  ? KNEE ARTHROTOMY  ~1991  ? right  ? LUMBAR FUSION  04/03/2013  ? [name], L5-S1  ? POLYPECTOMY    ? vocal cords 1992  ? TOTAL HIP ARTHROPLASTY Left  10/21/2014  ? Procedure: LEFT TOTAL HIP ARTHROPLASTY ANTERIOR APPROACH;  Surgeon: [name], MD;  Location: [facility];  Service: Orthopedics;  Laterality: Left;  ? TOTAL HIP REVISION  04/22/2011  ? Procedure: TOTAL HIP REVISION;  Surgeon: Gearlean Alf, MD;  Location: [facility];  Service: Orthopedics;  Laterality: Right;  ? ? ?There were no vitals filed for this visit. ? ? Subjective Assessment - 07/20/21 1701   ? ? Subjective Pt arrived today reporting doing well and ready to DC   ? Pertinent History bilateral THA and TKA , history of CVA's, multiple lumbar surgeries   ? How long can you stand comfortably? unsteadiness as soon as he stands   ? How long can you walk comfortably? unsteadiness with walking   ? Patient Stated Goals increased strength and balance   ? Currently in Pain? No/denies   ? ?  ?  ? ?  ? ? ? ? ? ? ? ? ? ? ? ? ? ? ? ? ? ? ? ? [facility] Adult PT  Treatment/Exercise - 07/20/21 0001   ? ?  ? Knee/Hip Exercises: Aerobic  ? Nustep Lvl 5 x 15 mins   ?  ? Knee/Hip Exercises: Machines for Strengthening  ? Cybex Knee Extension 20# 2x 30   ? Cybex Knee Flexion 30#2 x 30   ? ?  ?  ? ?  ? ? ? ? ? ? Balance Exercises - 07/20/21 0001   ? ?  ? Balance Exercises: Standing  ? Rockerboard Anterior/posterior;Intermittent UE support   5 mins  ? Step Ups Forward;6 inch;UE support 2   20 reps x 2  ? Balance Beam x 5 mins side/side   ? Sit to Stand Without upper extremity support   x 5 in 11 secs  ? ?  ?  ? ?  ? ? ? ? ? ? ? ? ? ? PT Long Term Goals - 07/20/21 1050   ? ?  ? PT LONG TERM GOAL #1  ? Title Patient is will be independent with his HEP.   ? Time 5   ? Period Weeks   ? Target Date 07/19/21   ?  ? PT LONG TERM GOAL #2  ? Title Patient will improve his five time sit to stand to 12 seconds or less for improved safety and reduced fall risk.   ? Time 5   ? Period Weeks   ? Status Achieved   ? Target Date 07/19/21   ?  ? PT LONG TERM GOAL #3  ? Title Patient will be able to demonstrate at least 5 seconds of tandem balance  bilaterally for improved stability with narrow base of support.   ? Time 5   ? Period Weeks   ? Status Achieved   ? Target Date 07/19/21   ? ?  ?  ? ?  ? ? ? ? ? ? ? ? Plan - 07/20/21 1108   ? ? Clinical Impression Statement Pt arrived today doing very well and was able to continue with therex and balance act.'s.Marland Kitchen He was able to meet all LTGs and will be DC'd at this time   ? Personal Factors and Comorbidities Other;Time since onset of injury/illness/exacerbation;Comorbidity 3+   ? Examination-Participation Restrictions Cleaning;Community Activity;Shop;Yard Work   ? Stability/Clinical Decision Making Unstable/Unpredictable   ? Rehab Potential Fair   ? PT Frequency 2x / week   ? PT Treatment/Interventions ADLs/Self Care Home Management;Cryotherapy;Electrical Stimulation;Moist Heat;Neuromuscular re-education;Balance training;Therapeutic exercise;Therapeutic activities;Functional mobility training;Gait training;Stair training;Patient/family education   ? PT Next Visit Plan DC   ? Consulted and Agree with Plan of Care Patient   ? ?  ?  ? ?  ? ? ?Patient will benefit from skilled therapeutic intervention in order to improve the following deficits and impairments:  Abnormal gait, Difficulty walking, Decreased activity tolerance, Decreased balance, Decreased mobility, Decreased strength, Impaired sensation, Pain ? ?Visit Diagnosis: ?History of falling ? ?Muscle weakness (generalized) ? ? ? ? ?Problem List ?Patient Active Problem List  ? Diagnosis Date Noted  ? Pre-operative respiratory examination 11/13/2019  ? Ambulatory dysfunction 07/24/2019  ? Closed nondisplaced fracture of condyle of right femur ([place]) 07/24/2019  ? Bulge of cervical disc without myelopathy   ? Falls 07/23/2019  ? Cough 02/20/2019  ? Absolute anemia 02/04/2019  ? Iron deficiency anemia 02/04/2019  ? Abnormal findings on diagnostic imaging of lung 11/27/2018  ? ILD (interstitial lung disease) ([place]) 11/27/2018  ? Mild obesity 08/05/2017  ? Type 2  diabetes mellitus treated without insulin ([place]) 08/05/2017  ?  MVP (mitral valve prolapse) 08/05/2017  ? Orthostatic hypotension 01/18/2017  ? Upper airway cough syndrome 11/23/2016  ? Cough variant asthma 11/23/2016  ? 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 Left ventricular hypertrophy 10/03/2015  ? Mixed hyperlipidemia 10/03/2015  ? PVCs (premature ventricular contractions) 07/13/2015  ? Obesity (BMI 30.0-34.9) 07/13/2015  ? Chest pain 06/29/2015  ? Uncontrolled diabetes mellitus without complication, without long-term current use of insulin 06/29/2015  ? Depression 06/29/2015  ? BPH (benign prostatic hyperplasia) 06/29/2015  ? OSA on CPAP 12/26/2006  ? ? ?Capucine Tryon,CHRIS, PTA ?07/20/2021, 5:06 PM ? ?[place] ?[facility] ?[address] ?[place], [place], [address] ?Phone: [phone]   Fax:  [phone] ? ?Name: [name]. ?MRN: [id number] ?Date of Birth: [birth year] ? ?PHYSICAL THERAPY DISCHARGE SUMMARY ? ?Visits from Start of Care: 10 ? ?Current functional level related to goals / functional outcomes: ?Patient was able to meet all of his goal for physical therapy and felt comfortable being discharged at this time.  ?  ?Remaining deficits: ?None ?  ?Education / Equipment: ?HEP   ? ?Patient agrees to discharge. Patient goals were met. Patient is being discharged due to meeting the stated rehab goals. ? ?Jacqulynn Cadet, PT, DPT   ? ?

## 2021-07-23 DIAGNOSIS — M5416 Radiculopathy, lumbar region: Secondary | ICD-10-CM | POA: Diagnosis not present

## 2021-07-23 DIAGNOSIS — M4326 Fusion of spine, lumbar region: Secondary | ICD-10-CM | POA: Diagnosis not present

## 2021-07-23 DIAGNOSIS — M961 Postlaminectomy syndrome, not elsewhere classified: Secondary | ICD-10-CM | POA: Diagnosis not present

## 2021-07-23 DIAGNOSIS — M21372 Foot drop, left foot: Secondary | ICD-10-CM | POA: Diagnosis not present

## 2021-08-26 DIAGNOSIS — M19011 Primary osteoarthritis, right shoulder: Secondary | ICD-10-CM | POA: Diagnosis not present

## 2021-08-26 DIAGNOSIS — M25511 Pain in right shoulder: Secondary | ICD-10-CM | POA: Diagnosis not present

## 2021-08-26 DIAGNOSIS — M19111 Post-traumatic osteoarthritis, right shoulder: Secondary | ICD-10-CM | POA: Diagnosis not present

## 2021-09-09 DIAGNOSIS — E7800 Pure hypercholesterolemia, unspecified: Secondary | ICD-10-CM | POA: Diagnosis not present

## 2021-09-09 DIAGNOSIS — E1169 Type 2 diabetes mellitus with other specified complication: Secondary | ICD-10-CM | POA: Diagnosis not present

## 2021-09-09 DIAGNOSIS — D649 Anemia, unspecified: Secondary | ICD-10-CM | POA: Diagnosis not present

## 2021-09-09 DIAGNOSIS — Z7984 Long term (current) use of oral hypoglycemic drugs: Secondary | ICD-10-CM | POA: Diagnosis not present

## 2021-09-15 NOTE — H&P (Signed)
Patient's anticipated LOS is less than 2 midnights, meeting these requirements: - Younger than 64 - Lives within 1 hour of care - Has a competent adult at home to recover with post-op recover - NO history of  - Chronic pain requiring opiods  - Diabetes  - Coronary Artery Disease  - Heart failure  - Heart attack  - Stroke  - DVT/VTE  - Cardiac arrhythmia  - Respiratory Failure/COPD  - Renal failure  - Anemia  - Advanced Liver disease     [name]. is an 76 y.o. male.    Chief Complaint: right shoulder pain  HPI: Pt is a 76 y.o. male complaining of right shoulder pain for multiple years. Pain had continually increased since the beginning. X-rays in the clinic show end-stage arthritic changes of the right shoulder. Pt has tried various conservative treatments which have failed to alleviate their symptoms, including injections and therapy. Various options are discussed with the patient. Risks, benefits and expectations were discussed with the patient. Patient understand the risks, benefits and expectations and wishes to proceed with surgery.   PCP:  Lajean Manes, MD  D/C Plans: Home  PMH: Past Medical History:  Diagnosis Date   Arthritis    Asthma    anxiety, tree/grass pollen   BPH (benign prostatic hyperplasia)    Bronchitis    hx of   Bulge of cervical disc without myelopathy    Chest pain    a. 06/2015 MV: EF 56%, no ischemia/infarct.   Complication of anesthesia 08/15/52   no complications but just says he typically needs "more than expected" to anesthetize; needs CPAP (setting 10) in recovery   Depression    ADD   Diabetes mellitus    PCP [name], type 2   Insomnia    Low blood pressure    Mitral valve prolapse    a. 06/2015 Echo: EF 65-70%, mild LVH, no rwma, mild MVP of ant leaflet and mild to mod posteriorly directed MR, mildly dil La.   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   JOINT REPLACEMENT     Left, Right Knee, Right hip   KNEE ARTHROSCOPY     right x 3-4, left x 2   KNEE ARTHROTOMY  ~1991   right   LUMBAR FUSION  04/03/2013   [name], L5-S1   POLYPECTOMY     vocal cords 1992   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   Procedure: LEFT TOTAL HIP ARTHROPLASTY ANTERIOR APPROACH;  Surgeon: [name], MD;  Location: [facility];  Service: Orthopedics;  Laterality: Left;   TOTAL HIP REVISION  04/22/2011   Procedure: TOTAL HIP REVISION;  Surgeon: Gearlean Alf, MD;  Location: [facility];  Service: Orthopedics;  Laterality: Right;    Social History:  reports that he quit smoking about 31 years ago. His smoking use included cigarettes. He has a 30.00 pack-year smoking history. He has never used smokeless tobacco. He reports that he does  not drink alcohol and does not use drugs. BMI: Estimated body mass index is 27.48 kg/m as calculated from the following:   Height as of 11/26/20: '6\' 2"'$  (1.88 m).   Weight as of 11/26/20: 97.1 kg.  Lab Results  Component Value Date   ALBUMIN 3.7 05/19/2020   Diabetes:   Patient has a diagnosis of diabetes,  Lab Results  Component Value Date   HGBA1C 5.9 (H) 07/23/2019   Smoking Status: Social History   Tobacco Use  Smoking Status Former   Packs/day: 1.50   Years: 20.00   Total pack years: 30.00   Types: Cigarettes   Quit date: 04/17/1990   Years since quitting: 31.4  Smokeless Tobacco Never   The patient is not currently a tobacco user. Counseling given: Not Answered     Allergies:  Allergies  Allergen Reactions   Breo Ellipta [Fluticasone Furoate-Vilanterol] Other (See  Comments)    Thrush   Demerol [Meperidine] Itching   Lamotrigine Hives and Itching   Nsaids Nausea Only    Gastric bypass    Medications: No current facility-administered medications for this encounter.   Current Outpatient Medications  Medication Sig Dispense Refill   albuterol (VENTOLIN HFA) 108 (90 Base) MCG/ACT inhaler TAKE 2 PUFFS BY MOUTH EVERY 6 HOURS AS NEEDED FOR WHEEZE OR SHORTNESS OF BREATH 4 g 3   aspirin EC 81 MG tablet Take 81 mg by mouth daily as needed for mild pain.     clonazePAM (KLONOPIN) 0.5 MG tablet Take 0.5 mg by mouth daily.      dutasteride (AVODART) 0.5 MG capsule Take 0.5 mg by mouth daily. (Patient not taking: Reported on 06/15/2021)     finasteride (PROSCAR) 5 MG tablet Take 1 tablet by mouth daily.     fluticasone (FLONASE) 50 MCG/ACT nasal spray Place 2 sprays into the nose 2 (two) times daily.     gabapentin (NEURONTIN) 300 MG capsule Take 1 capsule by mouth at bedtime. Pt takes 1 capsule in morning and 1 capsule by mouth at bedtime.     HYDROcodone-acetaminophen (NORCO) 10-325 MG per tablet Take 1-2 tablets by mouth every 6 (six) hours as needed for moderate pain. (Patient taking differently: Take 1 tablet by mouth in the morning, at noon, and at bedtime.) 90 tablet 0   metFORMIN (GLUCOPHAGE) 500 MG tablet Take 500 mg by mouth 2 (two) times daily with a meal.      methocarbamol (ROBAXIN) 500 MG tablet Take 1,000 mg by mouth at bedtime.      Multiple Vitamins-Minerals (CENTRUM SILVER ADULT 50+ PO) Take 1 tablet by mouth daily.     pravastatin (PRAVACHOL) 40 MG tablet Take 40 mg by mouth daily.     tamsulosin (FLOMAX) 0.4 MG CAPS capsule Take 0.4 mg by mouth daily.     UNABLE TO FIND Med Name: CPAP 10 cm (Patient not taking: Reported on 06/15/2021)      No results found for this or any previous visit (from the past 48 hour(s)). No results found.  ROS: Pain with rom of the right upper extremity  Physical Exam: Alert and oriented 76 y.o. male in no acute  distress Cranial nerves 2-12 intact Cervical spine: full rom with no tenderness, nv intact distally Chest: active breath sounds bilaterally, no wheeze rhonchi or rales Heart: regular rate and rhythm, no murmur Abd: non tender non distended with active bowel sounds Hip is stable with rom  Right shoulder painful and weak rom Nv intact distally No rashes or  edema distally  Assessment/Plan Assessment: right shoulder cuff arthropathy  Plan:  Patient will undergo a right reverse total shoulder by Dr. Veverly Fells at [place] Risks benefits and expectations were discussed with the patient. Patient understand risks, benefits and expectations and wishes to proceed. Preoperative templating of the joint replacement has been completed, documented, and submitted to the Operating Room personnel in order to optimize intra-operative equipment management.   Merla Riches PA-C, [facility] is now [employer] [address], [place], [place], [place] [address] Phone: [phone] [email or web address] Facebook  [employer]

## 2021-09-16 ENCOUNTER — Encounter: Payer: Self-pay | Admitting: Adult Health

## 2021-09-17 DIAGNOSIS — M4326 Fusion of spine, lumbar region: Secondary | ICD-10-CM | POA: Diagnosis not present

## 2021-09-17 DIAGNOSIS — G894 Chronic pain syndrome: Secondary | ICD-10-CM | POA: Diagnosis not present

## 2021-09-17 DIAGNOSIS — Z79899 Other long term (current) drug therapy: Secondary | ICD-10-CM | POA: Diagnosis not present

## 2021-09-17 DIAGNOSIS — Z79891 Long term (current) use of opiate analgesic: Secondary | ICD-10-CM | POA: Diagnosis not present

## 2021-09-20 NOTE — Progress Notes (Signed)
Anesthesia Review:  PCP: Cardiologist : Chest x-ray : 11/05/19- Ct of chest  EKG : 11/26/20  Echo : 2017  Stress test: 2017  PFT= 2020  Cardiac Cath :  Activity level:  Sleep Study/ CPAP : Fasting Blood Sugar :      / Checks Blood Sugar -- times a day:   Blood Thinner/ Instructions /Last Dose: ASA / Instructions/ Last Dose :   81 mg aspirin  Dm- type  Hgba1c-

## 2021-09-21 NOTE — Progress Notes (Signed)
[facility]- Preparing for Total Shoulder Arthroplasty  °  °Before surgery, you can play an important role. Because skin is not sterile, your skin needs to be as free of germs as possible. You can reduce the number of germs on your skin by using the following products. °Benzoyl Peroxide Gel °Reduces the number of germs present on the skin °Applied twice a day to shoulder area starting two days before surgery   ° °================================================================== ° °Please follow these instructions carefully: ° °BENZOYL PEROXIDE 5% GEL ° °Please do not use if you have an allergy to benzoyl peroxide.   If your skin becomes reddened/irritated stop using the benzoyl peroxide. ° °Starting two days before surgery, apply as follows: °Apply benzoyl peroxide in the morning and at night. Apply after taking a shower. If you are not taking a shower clean entire shoulder front, back, and side along with the armpit with a clean wet washcloth. ° °Place a quarter-sized dollop on your shoulder and rub in thoroughly, making sure to cover the front, back, and side of your shoulder, along with the armpit.  ° °2 days before ____ AM   ____ PM              1 day before ____ AM   ____ PM °                        °Do this twice a day for two days.  (Last application is the night before surgery, AFTER using the CHG soap as described below). ° °Do NOT apply benzoyl peroxide gel on the day of surgery.  °

## 2021-09-21 NOTE — Progress Notes (Signed)
DUE TO COVID-19 ONLY ONE VISITOR IS ALLOWED TO COME WITH YOU AND STAY IN THE WAITING ROOM ONLY DURING PRE OP AND PROCEDURE DAY OF SURGERY.  2 VISITOR  MAY VISIT WITH YOU AFTER SURGERY IN YOUR PRIVATE ROOM DURING VISITING HOURS ONLY! YOU MAY HAVE ONE PERSON SPEND THE NITE WITH YOU IN YOUR ROOM AFTER SURGERY.     Your procedure is scheduled on:          09/27/2021   Report to [facility] Main  Entrance   Report to admitting at       1130          AM DO NOT BRING INSURANCE CARD, PICTURE ID OR WALLET DAY OF SURGERY.      Call this number if you have problems the morning of surgery [phone]    REMEMBER: NO  SOLID FOODS , CANDY, GUM OR MINTS AFTER [place] .       Marland Kitchen CLEAR LIQUIDS UNTIL     1100am            DAY OF SURGERY.      PLEASE FINISH E g2 lower sugar  DRINK PER SURGEON ORDER  WHICH NEEDS TO BE COMPLETED AT   1100am       MORNING OF SURGERY.       CLEAR LIQUID DIET   Foods Allowed      WATER BLACK COFFEE ( SUGAR OK, NO MILK, CREAM OR CREAMER) REGULAR AND DECAF  TEA ( SUGAR OK NO MILK, CREAM, OR CREAMER) REGULAR AND DECAF  PLAIN JELLO ( NO RED)  FRUIT ICES ( NO RED, NO FRUIT PULP)  POPSICLES ( NO RED)  JUICE- APPLE, WHITE GRAPE AND WHITE CRANBERRY  SPORT DRINK LIKE GATORADE ( NO RED)  CLEAR BROTH ( VEGETABLE , CHICKEN OR BEEF)                                                                     BRUSH YOUR TEETH MORNING OF SURGERY AND RINSE YOUR MOUTH OUT, NO CHEWING GUM CANDY OR MINTS.     Take these medicines the morning of surgery with A SIP OF WATER:  inhalers as usual and bring, uroxatral,  proscar, gabapentin, effexor    DO NOT TAKE ANY DIABETIC MEDICATIONS DAY OF YOUR SURGERY                               You may not have any metal on your body including hair pins and              piercings  Do not wear jewelry, make-up, lotions, powders or perfumes, deodorant             Do not wear nail polish on your fingernails.              IF  YOU ARE A MALE AND WANT TO SHAVE UNDER ARMS OR LEGS PRIOR TO SURGERY YOU MUST DO SO AT LEAST 48 HOURS PRIOR TO SURGERY.              Men may shave face and neck.   Do not bring valuables to the hospital. [facility]  NOT             RESPONSIBLE   FOR VALUABLES.  Contacts, dentures or bridgework may not be worn into surgery.  Leave suitcase in the car. After surgery it may be brought to your room.     Patients discharged the day of surgery will not be allowed to drive home. IF YOU ARE HAVING SURGERY AND GOING HOME THE SAME DAY, YOU MUST HAVE AN ADULT TO DRIVE YOU HOME AND BE WITH YOU FOR 24 HOURS. YOU MAY GO HOME BY TAXI OR UBER OR ORTHERWISE, BUT AN ADULT MUST ACCOMPANY YOU HOME AND STAY WITH YOU FOR 24 HOURS.                Please read over the following fact sheets you were given: _____________________________________________________________________  [facility] - Preparing for Surgery Before surgery, you can play an important role.  Because skin is not sterile, your skin needs to be as free of germs as possible.  You can reduce the number of germs on your skin by washing with CHG (chlorahexidine gluconate) soap before surgery.  CHG is an antiseptic cleaner which kills germs and bonds with the skin to continue killing germs even after washing. Please DO NOT use if you have an allergy to CHG or antibacterial soaps.  If your skin becomes reddened/irritated stop using the CHG and inform your nurse when you arrive at Short Stay. Do not shave (including legs and underarms) for at least 48 hours prior to the first CHG shower.  You may shave your face/neck. Please follow these instructions carefully:  1.  Shower with CHG Soap the night before surgery and the  morning of Surgery.  2.  If you choose to wash your hair, wash your hair first as usual with your  normal  shampoo.  3.  After you shampoo, rinse your hair and body thoroughly to remove the  shampoo.                           4.  Use CHG as  you would any other liquid soap.  You can apply chg directly  to the skin and wash                       Gently with a scrungie or clean washcloth.  5.  Apply the CHG Soap to your body ONLY FROM THE NECK DOWN.   Do not use on face/ open                           Wound or open sores. Avoid contact with eyes, ears mouth and genitals (private parts).                       Wash face,  Genitals (private parts) with your normal soap.             6.  Wash thoroughly, paying special attention to the area where your surgery  will be performed.  7.  Thoroughly rinse your body with warm water from the neck down.  8.  DO NOT shower/wash with your normal soap after using and rinsing off  the CHG Soap.                9.  Pat yourself dry with a clean towel.  10.  Wear clean pajamas.            11.  Place clean sheets on your bed the night of your first shower and do not  sleep with pets. Day of Surgery : Do not apply any lotions/deodorants the morning of surgery.  Please wear clean clothes to the hospital/surgery center.  FAILURE TO FOLLOW THESE INSTRUCTIONS MAY RESULT IN THE CANCELLATION OF YOUR SURGERY PATIENT SIGNATURE_________________________________  NURSE SIGNATURE__________________________________  ________________________________________________________________________

## 2021-09-22 ENCOUNTER — Other Ambulatory Visit: Payer: Self-pay

## 2021-09-22 ENCOUNTER — Encounter (HOSPITAL_COMMUNITY)
Admission: RE | Admit: 2021-09-22 | Discharge: 2021-09-22 | Disposition: A | Discharge status: Home / Self Care | Payer: Medicare Other | Source: Ambulatory Visit | Attending: Orthopedic Surgery | Admitting: Orthopedic Surgery

## 2021-09-22 ENCOUNTER — Encounter (HOSPITAL_COMMUNITY): Payer: Self-pay

## 2021-09-22 VITALS — BP 114/86 | HR 66 | Temp 98.2°F | Resp 16 | Ht 74.0 in | Wt 220.0 lb

## 2021-09-22 DIAGNOSIS — E0836 Diabetes mellitus due to underlying condition with diabetic cataract: Secondary | ICD-10-CM | POA: Insufficient documentation

## 2021-09-22 DIAGNOSIS — Z01812 Encounter for preprocedural laboratory examination: Secondary | ICD-10-CM | POA: Diagnosis not present

## 2021-09-22 DIAGNOSIS — Z01818 Encounter for other preprocedural examination: Secondary | ICD-10-CM

## 2021-09-22 HISTORY — DX: Prediabetes: R73.03

## 2021-09-22 LAB — CBC
HCT: 36 % — ABNORMAL LOW (ref 39.0–52.0)
Hemoglobin: 11.4 g/dL — ABNORMAL LOW (ref 13.0–17.0)
MCH: 30.5 pg (ref 26.0–34.0)
MCHC: 31.7 g/dL (ref 30.0–36.0)
MCV: 96.3 fL (ref 80.0–100.0)
Platelets: 194 10*3/uL (ref 150–400)
RBC: 3.74 MIL/uL — ABNORMAL LOW (ref 4.22–5.81)
RDW: 13.2 % (ref 11.5–15.5)
WBC: 5 10*3/uL (ref 4.0–10.5)
nRBC: 0 % (ref 0.0–0.2)

## 2021-09-22 LAB — BASIC METABOLIC PANEL
Anion gap: 5 (ref 5–15)
BUN: 20 mg/dL (ref 8–23)
CO2: 28 mmol/L (ref 22–32)
Calcium: 9.1 mg/dL (ref 8.9–10.3)
Chloride: 107 mmol/L (ref 98–111)
Creatinine, Ser: 0.75 mg/dL (ref 0.61–1.24)
GFR, Estimated: >60 mL/min (ref >60)
Glucose, Bld: 142 mg/dL — ABNORMAL HIGH (ref 70–99)
Potassium: 4.4 mmol/L (ref 3.5–5.1)
Sodium: 140 mmol/L (ref 135–145)

## 2021-09-22 LAB — SURGICAL PCR SCREEN
MRSA, PCR: NEGATIVE
Staphylococcus aureus: POSITIVE — AB

## 2021-09-22 LAB — GLUCOSE, CAPILLARY: Glucose-Capillary: 139 mg/dL — ABNORMAL HIGH (ref 70–99)

## 2021-09-22 LAB — HEMOGLOBIN A1C
Hgb A1c MFr Bld: 6.4 % — ABNORMAL HIGH (ref 4.8–5.6)
Mean Plasma Glucose: 136.98 mg/dL

## 2021-09-23 ENCOUNTER — Encounter (HOSPITAL_COMMUNITY): Admission: RE | Admit: 2021-09-23 | Payer: Medicare Other | Source: Ambulatory Visit

## 2021-09-23 DIAGNOSIS — F3130 Bipolar disorder, current episode depressed, mild or moderate severity, unspecified: Secondary | ICD-10-CM | POA: Diagnosis not present

## 2021-09-27 ENCOUNTER — Encounter (HOSPITAL_COMMUNITY): Payer: Self-pay | Admitting: Orthopedic Surgery

## 2021-09-27 ENCOUNTER — Ambulatory Visit (HOSPITAL_BASED_OUTPATIENT_CLINIC_OR_DEPARTMENT_OTHER): Payer: Medicare Other | Admitting: Anesthesiology

## 2021-09-27 ENCOUNTER — Observation Stay (HOSPITAL_COMMUNITY): Payer: Medicare Other

## 2021-09-27 ENCOUNTER — Observation Stay (HOSPITAL_COMMUNITY)
Admission: RE | Admit: 2021-09-27 | Discharge: 2021-09-28 | Disposition: A | Discharge status: Home / Self Care | Payer: Medicare Other | Source: Ambulatory Visit | Attending: Orthopedic Surgery | Admitting: Orthopedic Surgery

## 2021-09-27 ENCOUNTER — Ambulatory Visit (HOSPITAL_COMMUNITY): Payer: Medicare Other | Admitting: Physician Assistant

## 2021-09-27 ENCOUNTER — Encounter (HOSPITAL_COMMUNITY)
Admission: RE | Disposition: A | Discharge status: Home / Self Care | Payer: Self-pay | Source: Ambulatory Visit | Attending: Orthopedic Surgery

## 2021-09-27 ENCOUNTER — Other Ambulatory Visit: Payer: Self-pay

## 2021-09-27 DIAGNOSIS — Z96643 Presence of artificial hip joint, bilateral: Secondary | ICD-10-CM | POA: Insufficient documentation

## 2021-09-27 DIAGNOSIS — R918 Other nonspecific abnormal finding of lung field: Secondary | ICD-10-CM | POA: Diagnosis not present

## 2021-09-27 DIAGNOSIS — M75101 Unspecified rotator cuff tear or rupture of right shoulder, not specified as traumatic: Principal | ICD-10-CM | POA: Insufficient documentation

## 2021-09-27 DIAGNOSIS — M19011 Primary osteoarthritis, right shoulder: Secondary | ICD-10-CM | POA: Diagnosis not present

## 2021-09-27 DIAGNOSIS — Z87891 Personal history of nicotine dependence: Secondary | ICD-10-CM | POA: Diagnosis not present

## 2021-09-27 DIAGNOSIS — E119 Type 2 diabetes mellitus without complications: Secondary | ICD-10-CM | POA: Insufficient documentation

## 2021-09-27 DIAGNOSIS — J45909 Unspecified asthma, uncomplicated: Secondary | ICD-10-CM | POA: Insufficient documentation

## 2021-09-27 DIAGNOSIS — Z96651 Presence of right artificial knee joint: Secondary | ICD-10-CM | POA: Insufficient documentation

## 2021-09-27 DIAGNOSIS — Z79899 Other long term (current) drug therapy: Secondary | ICD-10-CM | POA: Insufficient documentation

## 2021-09-27 DIAGNOSIS — Z7984 Long term (current) use of oral hypoglycemic drugs: Secondary | ICD-10-CM | POA: Insufficient documentation

## 2021-09-27 DIAGNOSIS — Z96611 Presence of right artificial shoulder joint: Secondary | ICD-10-CM | POA: Diagnosis not present

## 2021-09-27 DIAGNOSIS — Z01818 Encounter for other preprocedural examination: Secondary | ICD-10-CM

## 2021-09-27 DIAGNOSIS — M12811 Other specific arthropathies, not elsewhere classified, right shoulder: Secondary | ICD-10-CM | POA: Diagnosis not present

## 2021-09-27 DIAGNOSIS — E0836 Diabetes mellitus due to underlying condition with diabetic cataract: Secondary | ICD-10-CM

## 2021-09-27 DIAGNOSIS — Z7982 Long term (current) use of aspirin: Secondary | ICD-10-CM | POA: Insufficient documentation

## 2021-09-27 DIAGNOSIS — Z955 Presence of coronary angioplasty implant and graft: Secondary | ICD-10-CM | POA: Insufficient documentation

## 2021-09-27 DIAGNOSIS — Z471 Aftercare following joint replacement surgery: Secondary | ICD-10-CM | POA: Diagnosis not present

## 2021-09-27 DIAGNOSIS — G8918 Other acute postprocedural pain: Secondary | ICD-10-CM | POA: Diagnosis not present

## 2021-09-27 HISTORY — PX: REVERSE SHOULDER ARTHROPLASTY: SHX5054

## 2021-09-27 LAB — GLUCOSE, CAPILLARY
Glucose-Capillary: 132 mg/dL — ABNORMAL HIGH (ref 70–99)
Glucose-Capillary: 265 mg/dL — ABNORMAL HIGH (ref 70–99)

## 2021-09-27 SURGERY — ARTHROPLASTY, SHOULDER, TOTAL, REVERSE
Anesthesia: General | Site: Shoulder | Laterality: Right

## 2021-09-27 MED ORDER — ONDANSETRON HCL 4 MG/2ML IJ SOLN
INTRAMUSCULAR | Status: DC | PRN
  Administered 2021-09-27: 4 mg via INTRAVENOUS

## 2021-09-27 MED ORDER — CHLORHEXIDINE GLUCONATE 0.12 % MT SOLN
15.0000 mL | Freq: Once | OROMUCOSAL | Status: AC
  Administered 2021-09-27: 15 mL via OROMUCOSAL

## 2021-09-27 MED ORDER — FENTANYL CITRATE (PF) 100 MCG/2ML IJ SOLN
INTRAMUSCULAR | Status: DC | PRN
  Administered 2021-09-27 (×2): 50 ug via INTRAVENOUS

## 2021-09-27 MED ORDER — SODIUM CHLORIDE 0.9 % IR SOLN
Status: DC | PRN
  Administered 2021-09-27: 1000 mL

## 2021-09-27 MED ORDER — FINASTERIDE 5 MG PO TABS
5.0000 mg | ORAL_TABLET | Freq: Every day | ORAL | Status: DC
  Administered 2021-09-27 – 2021-09-28 (×2): 5 mg via ORAL
  Filled 2021-09-27 (×2): qty 1

## 2021-09-27 MED ORDER — SUGAMMADEX SODIUM 200 MG/2ML IV SOLN
INTRAVENOUS | Status: DC | PRN
  Administered 2021-09-27: 200 mg via INTRAVENOUS

## 2021-09-27 MED ORDER — ORAL CARE MOUTH RINSE: 15.0000 mL | Freq: Once | OROMUCOSAL | Status: AC

## 2021-09-27 MED ORDER — ONDANSETRON HCL 4 MG/2ML IJ SOLN: 4.0000 mg | Freq: Four times a day (QID) | INTRAMUSCULAR | Status: DC | PRN

## 2021-09-27 MED ORDER — DOCUSATE SODIUM 100 MG PO CAPS
100.0000 mg | ORAL_CAPSULE | Freq: Two times a day (BID) | ORAL | Status: DC
  Administered 2021-09-27 – 2021-09-28 (×2): 100 mg via ORAL
  Filled 2021-09-27 (×2): qty 1

## 2021-09-27 MED ORDER — DEXAMETHASONE SODIUM PHOSPHATE 10 MG/ML IJ SOLN
INTRAMUSCULAR | Status: DC | PRN
  Administered 2021-09-27: 10 mg via INTRAVENOUS

## 2021-09-27 MED ORDER — ACETAMINOPHEN 160 MG/5ML PO SOLN: 325.0000 mg | ORAL | Status: DC | PRN

## 2021-09-27 MED ORDER — BUPIVACAINE LIPOSOME 1.3 % IJ SUSP
INTRAMUSCULAR | Status: DC | PRN
  Administered 2021-09-27: 10 mL via PERINEURAL

## 2021-09-27 MED ORDER — PHENOL 1.4 % MT LIQD: 1.0000 | OROMUCOSAL | Status: DC | PRN

## 2021-09-27 MED ORDER — LACTATED RINGERS IV SOLN: INTRAVENOUS | Status: DC

## 2021-09-27 MED ORDER — ALBUTEROL SULFATE (2.5 MG/3ML) 0.083% IN NEBU: 2.5000 mg | INHALATION_SOLUTION | Freq: Four times a day (QID) | RESPIRATORY_TRACT | Status: DC | PRN

## 2021-09-27 MED ORDER — INSULIN ASPART 100 UNIT/ML IJ SOLN
4.0000 [IU] | Freq: Three times a day (TID) | INTRAMUSCULAR | Status: DC
Start: 2021-09-28 — End: 2021-09-28
  Administered 2021-09-28: 4 [IU] via SUBCUTANEOUS

## 2021-09-27 MED ORDER — METFORMIN HCL 500 MG PO TABS
500.0000 mg | ORAL_TABLET | Freq: Two times a day (BID) | ORAL | Status: DC
  Administered 2021-09-28: 500 mg via ORAL
  Filled 2021-09-27: qty 1

## 2021-09-27 MED ORDER — SUGAMMADEX SODIUM 500 MG/5ML IV SOLN
INTRAVENOUS | Status: AC
  Filled 2021-09-27: qty 5

## 2021-09-27 MED ORDER — HYDROMORPHONE HCL 1 MG/ML IJ SOLN: 0.5000 mg | INTRAMUSCULAR | Status: DC | PRN

## 2021-09-27 MED ORDER — MIDAZOLAM HCL 2 MG/2ML IJ SOLN
1.0000 mg | INTRAMUSCULAR | Status: DC
  Administered 2021-09-27: 2 mg via INTRAVENOUS
  Filled 2021-09-27: qty 2

## 2021-09-27 MED ORDER — BUPIVACAINE-EPINEPHRINE (PF) 0.5% -1:200000 IJ SOLN
INTRAMUSCULAR | Status: DC | PRN
  Administered 2021-09-27: 15 mL via PERINEURAL

## 2021-09-27 MED ORDER — OXYCODONE HCL 5 MG/5ML PO SOLN: 5.0000 mg | Freq: Once | ORAL | Status: DC | PRN

## 2021-09-27 MED ORDER — PHENYLEPHRINE HCL-NACL 20-0.9 MG/250ML-% IV SOLN
INTRAVENOUS | Status: DC | PRN
  Administered 2021-09-27: 50 ug/min via INTRAVENOUS

## 2021-09-27 MED ORDER — HYDROCODONE-ACETAMINOPHEN 10-325 MG PO TABS
1.0000 | ORAL_TABLET | Freq: Four times a day (QID) | ORAL | Status: DC | PRN
  Administered 2021-09-27 – 2021-09-28 (×2): 2 via ORAL
  Filled 2021-09-27 (×2): qty 1
  Filled 2021-09-27: qty 2

## 2021-09-27 MED ORDER — PHENYLEPHRINE HCL (PRESSORS) 10 MG/ML IV SOLN
INTRAVENOUS | Status: AC
  Filled 2021-09-27: qty 1

## 2021-09-27 MED ORDER — ROCURONIUM BROMIDE 10 MG/ML (PF) SYRINGE
PREFILLED_SYRINGE | INTRAVENOUS | Status: DC | PRN
  Administered 2021-09-27: 60 mg via INTRAVENOUS

## 2021-09-27 MED ORDER — POLYETHYLENE GLYCOL 3350 17 G PO PACK: 17.0000 g | PACK | Freq: Every day | ORAL | Status: DC | PRN

## 2021-09-27 MED ORDER — LIDOCAINE HCL (PF) 2 % IJ SOLN
INTRAMUSCULAR | Status: AC
  Filled 2021-09-27: qty 5

## 2021-09-27 MED ORDER — ONDANSETRON HCL 4 MG/2ML IJ SOLN: 4.0000 mg | Freq: Once | INTRAMUSCULAR | Status: DC | PRN

## 2021-09-27 MED ORDER — ZOLPIDEM TARTRATE 5 MG PO TABS: 5.0000 mg | ORAL_TABLET | Freq: Every evening | ORAL | Status: DC | PRN

## 2021-09-27 MED ORDER — ACETAMINOPHEN 325 MG PO TABS: 325.0000 mg | ORAL_TABLET | Freq: Four times a day (QID) | ORAL | Status: DC | PRN

## 2021-09-27 MED ORDER — GABAPENTIN 300 MG PO CAPS
300.0000 mg | ORAL_CAPSULE | Freq: Three times a day (TID) | ORAL | Status: DC
  Administered 2021-09-27 – 2021-09-28 (×2): 300 mg via ORAL
  Filled 2021-09-27 (×2): qty 1

## 2021-09-27 MED ORDER — CEFAZOLIN SODIUM-DEXTROSE 2-4 GM/100ML-% IV SOLN
2.0000 g | Freq: Four times a day (QID) | INTRAVENOUS | Status: AC
  Administered 2021-09-27 – 2021-09-28 (×3): 2 g via INTRAVENOUS
  Filled 2021-09-27 (×3): qty 100

## 2021-09-27 MED ORDER — OXYCODONE HCL 5 MG PO TABS: 5.0000 mg | ORAL_TABLET | Freq: Once | ORAL | Status: DC | PRN

## 2021-09-27 MED ORDER — BISACODYL 10 MG RE SUPP: 10.0000 mg | Freq: Every day | RECTAL | Status: DC | PRN

## 2021-09-27 MED ORDER — FENTANYL CITRATE PF 50 MCG/ML IJ SOSY
50.0000 ug | PREFILLED_SYRINGE | INTRAMUSCULAR | Status: DC
  Administered 2021-09-27: 100 ug via INTRAVENOUS
  Filled 2021-09-27: qty 2

## 2021-09-27 MED ORDER — ASPIRIN 81 MG PO TBEC
81.0000 mg | DELAYED_RELEASE_TABLET | Freq: Every day | ORAL | Status: DC
  Administered 2021-09-27: 81 mg via ORAL
  Filled 2021-09-27: qty 1

## 2021-09-27 MED ORDER — METOCLOPRAMIDE HCL 5 MG PO TABS: 5.0000 mg | ORAL_TABLET | Freq: Three times a day (TID) | ORAL | Status: DC | PRN

## 2021-09-27 MED ORDER — ACETAMINOPHEN 325 MG PO TABS: 325.0000 mg | ORAL_TABLET | ORAL | Status: DC | PRN

## 2021-09-27 MED ORDER — LORAZEPAM 0.5 MG PO TABS: 0.5000 mg | ORAL_TABLET | Freq: Two times a day (BID) | ORAL | Status: DC | PRN

## 2021-09-27 MED ORDER — FLUTICASONE PROPIONATE 50 MCG/ACT NA SUSP: 2.0000 | Freq: Every day | NASAL | Status: DC | PRN

## 2021-09-27 MED ORDER — ONDANSETRON HCL 4 MG/2ML IJ SOLN
INTRAMUSCULAR | Status: AC
  Filled 2021-09-27: qty 2

## 2021-09-27 MED ORDER — SODIUM CHLORIDE 0.9 % IV SOLN: INTRAVENOUS | Status: DC

## 2021-09-27 MED ORDER — LIDOCAINE 2% (20 MG/ML) 5 ML SYRINGE
INTRAMUSCULAR | Status: DC | PRN
  Administered 2021-09-27: 100 mg via INTRAVENOUS

## 2021-09-27 MED ORDER — ALFUZOSIN HCL ER 10 MG PO TB24
10.0000 mg | ORAL_TABLET | Freq: Every day | ORAL | Status: DC
  Filled 2021-09-27: qty 1

## 2021-09-27 MED ORDER — OXYCODONE HCL 5 MG PO TABS: 5.0000 mg | ORAL_TABLET | Freq: Three times a day (TID) | ORAL | 0 refills | Status: DC | PRN

## 2021-09-27 MED ORDER — BUPIVACAINE-EPINEPHRINE (PF) 0.25% -1:200000 IJ SOLN
INTRAMUSCULAR | Status: AC
  Filled 2021-09-27: qty 30

## 2021-09-27 MED ORDER — BUPIVACAINE-EPINEPHRINE (PF) 0.25% -1:200000 IJ SOLN
INTRAMUSCULAR | Status: DC | PRN
  Administered 2021-09-27: 30 mL

## 2021-09-27 MED ORDER — ACETAMINOPHEN 10 MG/ML IV SOLN
INTRAVENOUS | Status: AC
  Filled 2021-09-27: qty 100

## 2021-09-27 MED ORDER — FENTANYL CITRATE (PF) 100 MCG/2ML IJ SOLN
INTRAMUSCULAR | Status: AC
  Filled 2021-09-27: qty 2

## 2021-09-27 MED ORDER — VENLAFAXINE HCL ER 150 MG PO CP24
150.0000 mg | ORAL_CAPSULE | Freq: Every morning | ORAL | Status: DC
  Administered 2021-09-28: 150 mg via ORAL
  Filled 2021-09-27: qty 1

## 2021-09-27 MED ORDER — PRAVASTATIN SODIUM 20 MG PO TABS
40.0000 mg | ORAL_TABLET | Freq: Every day | ORAL | Status: DC
  Administered 2021-09-27 – 2021-09-28 (×2): 40 mg via ORAL
  Filled 2021-09-27 (×2): qty 2

## 2021-09-27 MED ORDER — PROPOFOL 10 MG/ML IV BOLUS
INTRAVENOUS | Status: DC | PRN
  Administered 2021-09-27: 200 mg via INTRAVENOUS

## 2021-09-27 MED ORDER — PROPOFOL 10 MG/ML IV BOLUS
INTRAVENOUS | Status: AC
  Filled 2021-09-27: qty 20

## 2021-09-27 MED ORDER — ONDANSETRON HCL 4 MG PO TABS: 4.0000 mg | ORAL_TABLET | Freq: Four times a day (QID) | ORAL | Status: DC | PRN

## 2021-09-27 MED ORDER — ACETAMINOPHEN 500 MG PO TABS: 1000.0000 mg | ORAL_TABLET | Freq: Once | ORAL | Status: DC

## 2021-09-27 MED ORDER — ROCURONIUM BROMIDE 10 MG/ML (PF) SYRINGE
PREFILLED_SYRINGE | INTRAVENOUS | Status: AC
  Filled 2021-09-27: qty 10

## 2021-09-27 MED ORDER — MENTHOL 3 MG MT LOZG: 1.0000 | LOZENGE | OROMUCOSAL | Status: DC | PRN

## 2021-09-27 MED ORDER — ACETAMINOPHEN 325 MG PO TABS: 650.0000 mg | ORAL_TABLET | Freq: Four times a day (QID) | ORAL | Status: DC | PRN

## 2021-09-27 MED ORDER — DEXAMETHASONE SODIUM PHOSPHATE 10 MG/ML IJ SOLN
INTRAMUSCULAR | Status: AC
  Filled 2021-09-27: qty 1

## 2021-09-27 MED ORDER — METHOCARBAMOL 500 MG PO TABS
750.0000 mg | ORAL_TABLET | Freq: Four times a day (QID) | ORAL | Status: DC | PRN
Start: 2021-09-27 — End: 2021-09-28
  Administered 2021-09-28: 750 mg via ORAL
  Filled 2021-09-27: qty 2

## 2021-09-27 MED ORDER — ACETAMINOPHEN 10 MG/ML IV SOLN
INTRAVENOUS | Status: DC | PRN
  Administered 2021-09-27: 1000 mg via INTRAVENOUS

## 2021-09-27 MED ORDER — METOCLOPRAMIDE HCL 5 MG/ML IJ SOLN: 5.0000 mg | Freq: Three times a day (TID) | INTRAMUSCULAR | Status: DC | PRN

## 2021-09-27 MED ORDER — STERILE WATER FOR IRRIGATION IR SOLN
Status: DC | PRN
  Administered 2021-09-27: 2000 mL

## 2021-09-27 MED ORDER — TIZANIDINE HCL 4 MG PO TABS: 4.0000 mg | ORAL_TABLET | Freq: Four times a day (QID) | ORAL | Status: DC | PRN

## 2021-09-27 MED ORDER — INSULIN ASPART 100 UNIT/ML IJ SOLN
0.0000 [IU] | Freq: Three times a day (TID) | INTRAMUSCULAR | Status: DC
  Administered 2021-09-28: 2 [IU] via SUBCUTANEOUS

## 2021-09-27 MED ORDER — MEPERIDINE HCL 50 MG/ML IJ SOLN: 6.2500 mg | INTRAMUSCULAR | Status: DC | PRN

## 2021-09-27 MED ORDER — FENTANYL CITRATE PF 50 MCG/ML IJ SOSY: 25.0000 ug | PREFILLED_SYRINGE | INTRAMUSCULAR | Status: DC | PRN

## 2021-09-27 MED ORDER — CEFAZOLIN SODIUM-DEXTROSE 2-4 GM/100ML-% IV SOLN
2.0000 g | INTRAVENOUS | Status: AC
  Administered 2021-09-27: 2 g via INTRAVENOUS
  Filled 2021-09-27: qty 100

## 2021-09-27 SURGICAL SUPPLY — 79 items
AID PSTN UNV HD RSTRNT DISP (MISCELLANEOUS) ×1
BAG COUNTER SPONGE SURGICOUNT (BAG) ×1 IMPLANT
BAG SPEC THK2 15X12 ZIP CLS (MISCELLANEOUS) ×1
BAG SPNG CNTER NS LX DISP (BAG) ×1
BAG ZIPLOCK 12X15 (MISCELLANEOUS) ×1 IMPLANT
BIT DRILL 1.6MX128 (BIT) IMPLANT
BIT DRILL 170X2.5X (BIT) IMPLANT
BIT DRL 170X2.5X (BIT) ×1
BLADE SAG 18X100X1.27 (BLADE) ×2 IMPLANT
COVER BACK TABLE 60X90IN (DRAPES) ×2 IMPLANT
COVER SURGICAL LIGHT HANDLE (MISCELLANEOUS) ×2 IMPLANT
CUP HUMERAL 42 PLUS 3 (Orthopedic Implant) ×1 IMPLANT
DRAPE INCISE IOBAN 66X45 STRL (DRAPES) ×2 IMPLANT
DRAPE ORTHO SPLIT 77X108 STRL (DRAPES) ×4
DRAPE SHEET LG 3/4 BI-LAMINATE (DRAPES) ×2 IMPLANT
DRAPE SURG ORHT 6 SPLT 77X108 (DRAPES) ×2 IMPLANT
DRAPE TOP 10253 STERILE (DRAPES) ×2 IMPLANT
DRAPE U-SHAPE 47X51 STRL (DRAPES) ×2 IMPLANT
DRILL 2.5 (BIT) ×2
DRSG ADAPTIC 3X8 NADH LF (GAUZE/BANDAGES/DRESSINGS) ×2 IMPLANT
DRSG PAD ABDOMINAL 8X10 ST (GAUZE/BANDAGES/DRESSINGS) ×2 IMPLANT
DURAPREP 26ML APPLICATOR (WOUND CARE) ×2 IMPLANT
ELECT BLADE TIP CTD 4 INCH (ELECTRODE) ×2 IMPLANT
ELECT NDL TIP 2.8 STRL (NEEDLE) ×1 IMPLANT
ELECT NEEDLE TIP 2.8 STRL (NEEDLE) ×2 IMPLANT
ELECT REM PT RETURN 15FT ADLT (MISCELLANEOUS) ×2 IMPLANT
EPIPHYSI RIGHT SZ 2 (Shoulder) ×2 IMPLANT
EPIPHYSIS RIGHT SZ 2 (Shoulder) IMPLANT
FACESHIELD WRAPAROUND (MASK) ×2 IMPLANT
FACESHIELD WRAPAROUND OR TEAM (MASK) ×1 IMPLANT
GAUZE PAD ABD 7.5X8 STRL (GAUZE/BANDAGES/DRESSINGS) ×1 IMPLANT
GAUZE SPONGE 4X4 12PLY STRL (GAUZE/BANDAGES/DRESSINGS) ×2 IMPLANT
GLENOSPHERE XTEND LAT 42+0 STD (Miscellaneous) ×1 IMPLANT
GLOVE BIOGEL PI IND STRL 7.5 (GLOVE) ×1 IMPLANT
GLOVE BIOGEL PI IND STRL 8.5 (GLOVE) ×1 IMPLANT
GLOVE BIOGEL PI INDICATOR 7.5 (GLOVE) ×1
GLOVE BIOGEL PI INDICATOR 8.5 (GLOVE) ×1
GLOVE ORTHO TXT STRL SZ7.5 (GLOVE) ×4 IMPLANT
GLOVE SURG ORTHO 8.5 STRL (GLOVE) ×3 IMPLANT
GOWN STRL REUS W/ TWL XL LVL3 (GOWN DISPOSABLE) ×2 IMPLANT
GOWN STRL REUS W/TWL XL LVL3 (GOWN DISPOSABLE) ×4
KIT BASIN OR (CUSTOM PROCEDURE TRAY) ×2 IMPLANT
KIT TURNOVER KIT A (KITS) ×1 IMPLANT
MANIFOLD NEPTUNE II (INSTRUMENTS) ×2 IMPLANT
METAGLENE DELTA EXTEND (Trauma) IMPLANT
METAGLENE DXTEND (Trauma) ×2 IMPLANT
NDL MAYO CATGUT SZ4 TPR NDL (NEEDLE) ×1 IMPLANT
NEEDLE MAYO CATGUT SZ4 (NEEDLE) ×2 IMPLANT
NS IRRIG 1000ML POUR BTL (IV SOLUTION) ×2 IMPLANT
PACK SHOULDER (CUSTOM PROCEDURE TRAY) ×2 IMPLANT
PIN GUIDE 1.2 (PIN) ×1 IMPLANT
PIN GUIDE GLENOPHERE 1.5MX300M (PIN) ×1 IMPLANT
PIN METAGLENE 2.5 (PIN) ×1 IMPLANT
PROTECTOR NERVE ULNAR (MISCELLANEOUS) ×2 IMPLANT
RESTRAINT HEAD UNIVERSAL NS (MISCELLANEOUS) ×2 IMPLANT
SCREW 4.5X18MM (Screw) ×2 IMPLANT
SCREW 4.5X24MM (Screw) ×2 IMPLANT
SCREW 48L (Screw) ×1 IMPLANT
SCREW BN 18X4.5XSTRL SHLDR (Screw) IMPLANT
SCREW BN 24X4.5XLCK STRL (Screw) IMPLANT
SCREW LOCK 42 (Screw) ×1 IMPLANT
SLING ARM FOAM STRAP LRG (SOFTGOODS) ×1 IMPLANT
SMARTMIX MINI TOWER (MISCELLANEOUS)
SPIKE FLUID TRANSFER (MISCELLANEOUS) ×2 IMPLANT
SPONGE T-LAP 4X18 ~~LOC~~+RFID (SPONGE) ×2 IMPLANT
STAPLER VISISTAT 35W (STAPLE) ×1 IMPLANT
STEM 12 HA (Stem) ×1 IMPLANT
STRIP CLOSURE SKIN 1/2X4 (GAUZE/BANDAGES/DRESSINGS) ×2 IMPLANT
SUCTION FRAZIER HANDLE 10FR (MISCELLANEOUS) ×2
SUCTION TUBE FRAZIER 10FR DISP (MISCELLANEOUS) ×1 IMPLANT
SUT FIBERWIRE #2 38 T-5 BLUE (SUTURE) ×4
SUT MNCRL AB 4-0 PS2 18 (SUTURE) ×2 IMPLANT
SUT VIC AB 0 CT1 36 (SUTURE) ×4 IMPLANT
SUT VIC AB 0 CT2 27 (SUTURE) ×2 IMPLANT
SUT VIC AB 2-0 CT1 27 (SUTURE) ×2
SUT VIC AB 2-0 CT1 TAPERPNT 27 (SUTURE) ×1 IMPLANT
SUTURE FIBERWR #2 38 T-5 BLUE (SUTURE) ×2 IMPLANT
TOWEL OR 17X26 10 PK STRL BLUE (TOWEL DISPOSABLE) ×2 IMPLANT
TOWER SMARTMIX MINI (MISCELLANEOUS) IMPLANT

## 2021-09-27 NOTE — Discharge Instructions (Signed)
Ice to the shoulder constantly.  Keep the incision covered and clean and dry for one week, then ok to get it wet in the shower. ° °Do exercise as instructed several times per day. ° °DO NOT reach behind your back or push up out of a chair with the operative arm. ° °Use a sling while you are up and around for comfort, may remove while seated.  Keep pillow propped behind the operative elbow. ° °Follow up with Dr Takiesha Mcdevitt in two weeks in the office, call [phone] for appt °

## 2021-09-27 NOTE — Interval H&P Note (Signed)
History and Physical Interval Note:  09/27/2021 2:27 PM  Ronald Pavlov.  has presented today for surgery, with the diagnosis of right shoulder rotator cuff tear arthropathy.  The various methods of treatment have been discussed with the patient and family. After consideration of risks, benefits and other options for treatment, the patient has consented to  Procedure(s) with comments: REVERSE SHOULDER ARTHROPLASTY (Right) - with ISB as a surgical intervention.  The patient's history has been reviewed, patient examined, no change in status, stable for surgery.  I have reviewed the patient's chart and labs.  Questions were answered to the patient's satisfaction.     Augustin Schooling

## 2021-09-27 NOTE — Progress Notes (Signed)
Pt has noted redness to right upper back and around top of right shoulder.Pt stated " I was using tape to keep my shoulder together" Pt  did not make Dr. Veverly Fells aware.

## 2021-09-27 NOTE — Progress Notes (Signed)
AssistedDr. Rodman Comp with right, interscalene  block. Side rails up, monitors on throughout procedure. See vital signs in flow sheet. Tolerated Procedure well.

## 2021-09-27 NOTE — Anesthesia Procedure Notes (Signed)
Anesthesia Regional Block: Interscalene brachial plexus block   Pre-Anesthetic Checklist: , timeout performed,  Correct Patient, Correct Site, Correct Laterality,  Correct Procedure, Correct Position, site marked,  Risks and benefits discussed,  Surgical consent,  Pre-op evaluation,  At surgeon's request and post-op pain management  Laterality: Right  Prep: chloraprep       Needles:  Injection technique: Single-shot  Needle Type: Echogenic Needle     Needle Length: 9cm  Needle Gauge: 21     Additional Needles:   Procedures:, nerve stimulator,,, ultrasound used (permanent image in chart),,     Nerve Stimulator or Paresthesia:  Response: deltoid and bicep, 0.5 mA  Additional Responses:   Narrative:  Start time: 09/27/2021 1:40 PM End time: 09/27/2021 1:45 PM Injection made incrementally with aspirations every 5 mL.  Performed by: Personally  Anesthesiologist: Suzette Battiest, MD

## 2021-09-27 NOTE — Anesthesia Postprocedure Evaluation (Signed)
Anesthesia Post Note  Patient: [name].  Procedure(s) Performed: REVERSE SHOULDER ARTHROPLASTY (Right: Shoulder)     Patient location during evaluation: PACU Anesthesia Type: General Level of consciousness: sedated Pain management: pain level controlled Vital Signs Assessment: post-procedure vital signs reviewed and stable Respiratory status: spontaneous breathing and respiratory function stable Cardiovascular status: stable Postop Assessment: no apparent nausea or vomiting Anesthetic complications: no   No notable events documented.  Last Vitals:  Vitals:   09/27/21 1715 09/27/21 1730  BP: (!) 148/92 (!) 146/77  Pulse: 82 74  Resp: 17 19  Temp:    SpO2: 95% 94%    Last Pain:  Vitals:   09/27/21 1645  TempSrc:   PainSc: 0-No pain                 [name] DANIEL

## 2021-09-27 NOTE — Transfer of Care (Signed)
Immediate Anesthesia Transfer of Care Note  Patient: [name].  Procedure(s) Performed: REVERSE SHOULDER ARTHROPLASTY (Right: Shoulder)  Patient Location: PACU  Anesthesia Type:General  Level of Consciousness: awake, alert  and oriented  Airway & Oxygen Therapy: Patient Spontanous Breathing and Patient connected to face mask oxygen  Post-op Assessment: Report given to RN and Post -op Vital signs reviewed and stable  Post vital signs: Reviewed and stable  Last Vitals:  Vitals Value Taken Time  BP 158/91 09/27/21 1645  Temp    Pulse 86 09/27/21 1647  Resp 20 09/27/21 1647  SpO2 98 % 09/27/21 1647  Vitals shown include unvalidated device data.  Last Pain:  Vitals:   09/27/21 1229  TempSrc:   PainSc: 7       Patients Stated Pain Goal: 6 (87/86/76 7209)  Complications: No notable events documented.

## 2021-09-27 NOTE — Anesthesia Procedure Notes (Addendum)
Procedure Name: Intubation Date/Time: 09/27/2021 3:06 PM  Performed by: Sharlette Dense, CRNAPatient Re-evaluated:Patient Re-evaluated prior to induction Oxygen Delivery Method: Circle system utilized Preoxygenation: Pre-oxygenation with 100% oxygen Induction Type: IV induction Ventilation: Mask ventilation without difficulty and Oral airway inserted - appropriate to patient size Laryngoscope Size: Miller and 3 Grade View: Grade I Tube type: Oral Tube size: 8.0 mm Number of attempts: 1 Airway Equipment and Method: Stylet Placement Confirmation: positive ETCO2, breath sounds checked- equal and bilateral and ETT inserted through vocal cords under direct vision Secured at: 22 cm Tube secured with: Tape Dental Injury: Teeth and Oropharynx as per pre-operative assessment

## 2021-09-27 NOTE — Anesthesia Preprocedure Evaluation (Signed)
Anesthesia Evaluation  Patient identified by MRN, date of birth, ID band Patient awake    Reviewed: Allergy & Precautions, NPO status , Patient's Chart, lab work & pertinent test results  Airway Mallampati: II  TM Distance: >3 FB Neck ROM: Full    Dental  (+) Dental Advisory Given   Pulmonary asthma , sleep apnea , former smoker,    breath sounds clear to auscultation       Cardiovascular negative cardio ROS   Rhythm:Regular Rate:Normal  RBBB   Neuro/Psych negative neurological ROS     GI/Hepatic negative GI ROS, Neg liver ROS,   Endo/Other  diabetes, Type 2  Renal/GU negative Renal ROS     Musculoskeletal   Abdominal   Peds  Hematology  (+) Blood dyscrasia, anemia ,   Anesthesia Other Findings   Reproductive/Obstetrics                             Lab Results  Component Value Date   WBC 5.0 09/22/2021   HGB 11.4 (L) 09/22/2021   HCT 36.0 (L) 09/22/2021   MCV 96.3 09/22/2021   PLT 194 09/22/2021   Lab Results  Component Value Date   CREATININE 0.75 09/22/2021   BUN 20 09/22/2021   NA 140 09/22/2021   K 4.4 09/22/2021   CL 107 09/22/2021   CO2 28 09/22/2021    Anesthesia Physical Anesthesia Plan  ASA: 2  Anesthesia Plan: General   Post-op Pain Management: Regional block* and Tylenol PO (pre-op)*   Induction: Intravenous  PONV Risk Score and Plan: 2 and Dexamethasone, Ondansetron and Treatment may vary due to age or medical condition  Airway Management Planned: Oral ETT  Additional Equipment: None  Intra-op Plan:   Post-operative Plan: Extubation in OR  Informed Consent: I have reviewed the patients History and Physical, chart, labs and discussed the procedure including the risks, benefits and alternatives for the proposed anesthesia with the patient or authorized representative who has indicated his/her understanding and acceptance.     Dental advisory  given  Plan Discussed with: CRNA  Anesthesia Plan Comments:         Anesthesia Quick Evaluation

## 2021-09-27 NOTE — Brief Op Note (Signed)
09/27/2021  4:47 PM  PATIENT:  Norva Pavlov.  76 y.o. male  PRE-OPERATIVE DIAGNOSIS:  right shoulder rotator cuff tear arthropathy, end stage  POST-OPERATIVE DIAGNOSIS:  right shoulder rotator cuff tear arthropathy, end stage  PROCEDURE:  Procedure(s) with comments: REVERSE SHOULDER ARTHROPLASTY (Right) - with ISB DePuy Delta Xtend with no subscap repair  SURGEON:  Surgeon(s) and Role:    Netta Cedars, MD - Primary  PHYSICIAN ASSISTANT:   ASSISTANTS: Ventura Bruns, PA-C   ANESTHESIA:   regional and general  EBL:  50 mL   BLOOD ADMINISTERED:none  DRAINS: none   LOCAL MEDICATIONS USED:  MARCAINE     SPECIMEN:  No Specimen  DISPOSITION OF SPECIMEN:  N/A  COUNTS:  YES  TOURNIQUET:  * No tourniquets in log *  DICTATION: .Other Dictation: Dictation Number [id number]  PLAN OF CARE: Admit for overnight observation  PATIENT DISPOSITION:  PACU - hemodynamically stable.   Delay start of Pharmacological VTE agent (>24hrs) due to surgical blood loss or risk of bleeding: not applicable

## 2021-09-28 ENCOUNTER — Other Ambulatory Visit: Payer: Self-pay

## 2021-09-28 DIAGNOSIS — M75101 Unspecified rotator cuff tear or rupture of right shoulder, not specified as traumatic: Secondary | ICD-10-CM | POA: Diagnosis not present

## 2021-09-28 DIAGNOSIS — Z87891 Personal history of nicotine dependence: Secondary | ICD-10-CM | POA: Diagnosis not present

## 2021-09-28 DIAGNOSIS — J45909 Unspecified asthma, uncomplicated: Secondary | ICD-10-CM | POA: Diagnosis not present

## 2021-09-28 DIAGNOSIS — E119 Type 2 diabetes mellitus without complications: Secondary | ICD-10-CM | POA: Diagnosis not present

## 2021-09-28 DIAGNOSIS — Z96643 Presence of artificial hip joint, bilateral: Secondary | ICD-10-CM | POA: Diagnosis not present

## 2021-09-28 DIAGNOSIS — Z955 Presence of coronary angioplasty implant and graft: Secondary | ICD-10-CM | POA: Diagnosis not present

## 2021-09-28 LAB — BASIC METABOLIC PANEL
Anion gap: 6 (ref 5–15)
BUN: 19 mg/dL (ref 8–23)
CO2: 27 mmol/L (ref 22–32)
Calcium: 9 mg/dL (ref 8.9–10.3)
Chloride: 106 mmol/L (ref 98–111)
Creatinine, Ser: 0.82 mg/dL (ref 0.61–1.24)
GFR, Estimated: >60 mL/min (ref >60)
Glucose, Bld: 190 mg/dL — ABNORMAL HIGH (ref 70–99)
Potassium: 5.4 mmol/L — ABNORMAL HIGH (ref 3.5–5.1)
Sodium: 139 mmol/L (ref 135–145)

## 2021-09-28 LAB — HEMOGLOBIN AND HEMATOCRIT, BLOOD
HCT: 33.6 % — ABNORMAL LOW (ref 39.0–52.0)
Hemoglobin: 10.8 g/dL — ABNORMAL LOW (ref 13.0–17.0)

## 2021-09-28 LAB — GLUCOSE, CAPILLARY: Glucose-Capillary: 128 mg/dL — ABNORMAL HIGH (ref 70–99)

## 2021-09-28 NOTE — Discharge Summary (Signed)
In most cases prophylactic antibiotics for Dental procdeures after total joint surgery are not necessary.  Exceptions are as follows:  1. History of prior total joint infection  2. Severely immunocompromised (Organ Transplant, cancer chemotherapy, Rheumatoid biologic meds such as Rosenberg)  3. Poorly controlled diabetes (A1C &gt; 8.0, blood glucose over 200)  If you have one of these conditions, contact your surgeon for an antibiotic prescription, prior to your dental procedure. Orthopedic Discharge Summary        Physician Discharge Summary  Patient ID: [name]. MRN: [id number] DOB/AGE: 1945-10-27 76 y.o.  Admit date: 09/27/2021 Discharge date: 09/28/2021   Procedures:  Procedure(s) (LRB): REVERSE SHOULDER ARTHROPLASTY (Right)  Attending Physician:  Dr. Esmond Plants  Admission Diagnoses:   Right shoulder RCT arthropathy  Discharge Diagnoses:  same   Past Medical History:  Diagnosis Date   Arthritis    Asthma    anxiety, tree/grass pollen   BPH (benign prostatic hyperplasia)    Bronchitis    hx of   Bulge of cervical disc without myelopathy    Chest pain    a. 06/2015 MV: EF 56%, no ischemia/infarct.   Complication of anesthesia 83/15/1761   no complications but just says he typically needs "more than expected" to anesthetize; needs CPAP (setting 10) in recovery   Depression    ADD   Insomnia    Low blood pressure    Mitral valve prolapse    a. 06/2015 Echo: EF 65-70%, mild LVH, no rwma, mild MVP of ant leaflet and mild to mod posteriorly directed MR, mildly dil La.   Obesity    Pre-diabetes    PVC's (premature ventricular contractions)    Recurrent upper respiratory infection (URI)    states Dr Wynelle Link aware- no fever- states is improving   Sleep apnea    no longer uses cpap due to weight loss   Tremor    d/t lithium    PCP: Lajean Manes, MD   Discharged Condition: good  Hospital Course:  Patient underwent the above stated procedure  on 09/27/2021. Patient tolerated the procedure well and brought to the recovery room in good condition and subsequently to the floor. Patient had an uncomplicated hospital course and was stable for discharge.   Disposition: Discharge disposition: 01-Home or Self Care      with follow up in 2 weeks    Follow-up Information     Netta Cedars, MD Follow up.   Specialty: Orthopedic Surgery Contact information: [id number] [place] [address] [phone]                 Dental Antibiotics:  In most cases prophylactic antibiotics for Dental procdeures after total joint surgery are not necessary.  Exceptions are as follows:  1. History of prior total joint infection  2. Severely immunocompromised (Organ Transplant, cancer chemotherapy, Rheumatoid biologic meds such as Driscoll)  3. Poorly controlled diabetes (A1C &gt; 8.0, blood glucose over 200)  If you have one of these conditions, contact your surgeon for an antibiotic prescription, prior to your dental procedure.  Discharge Instructions     Call MD / Call 911   Complete by: As directed    If you experience chest pain or shortness of breath, CALL 911 and be transported to the hospital emergency room.  If you develope a fever above 101 F, pus (white drainage) or increased drainage or redness at the wound, or calf pain, call your surgeon's office.   Constipation Prevention  Complete by: As directed    Drink plenty of fluids.  Prune juice may be helpful.  You may use a stool softener, such as Colace (over the counter) 100 mg twice a day.  Use MiraLax (over the counter) for constipation as needed.   Diet - low sodium heart healthy   Complete by: As directed    Increase activity slowly as tolerated   Complete by: As directed    Post-operative opioid taper instructions:   Complete by: As directed    POST-OPERATIVE OPIOID TAPER INSTRUCTIONS: It is important to wean off of your opioid medication as  soon as possible. If you do not need pain medication after your surgery it is ok to stop day one. Opioids include: Codeine, Hydrocodone(Norco, Vicodin), Oxycodone(Percocet, oxycontin) and hydromorphone amongst others.  Long term and even short term use of opiods can cause: Increased pain response Dependence Constipation Depression Respiratory depression And more.  Withdrawal symptoms can include Flu like symptoms Nausea, vomiting And more Techniques to manage these symptoms Hydrate well Eat regular healthy meals Stay active Use relaxation techniques(deep breathing, meditating, yoga) Do Not substitute Alcohol to help with tapering If you have been on opioids for less than two weeks and do not have pain than it is ok to stop all together.  Plan to wean off of opioids This plan should start within one week post op of your joint replacement. Maintain the same interval or time between taking each dose and first decrease the dose.  Cut the total daily intake of opioids by one tablet each day Next start to increase the time between doses. The last dose that should be eliminated is the evening dose.          Allergies as of 09/28/2021       Reactions   Breo Ellipta [fluticasone Furoate-vilanterol] Other (See Comments)   Thrush   Demerol [meperidine] Itching   Lamotrigine Hives, Itching   Nsaids Nausea Only   Gastric bypass        Medication List     TAKE these medications    acetaminophen 325 MG tablet Commonly known as: TYLENOL Take 650 mg by mouth every 6 (six) hours as needed.   albuterol 108 (90 Base) MCG/ACT inhaler Commonly known as: VENTOLIN HFA TAKE 2 PUFFS BY MOUTH EVERY 6 HOURS AS NEEDED FOR WHEEZE OR SHORTNESS OF BREATH What changed: See the new instructions.   alfuzosin 10 MG 24 hr tablet Commonly known as: UROXATRAL Take 10 mg by mouth daily.   aspirin EC 81 MG tablet Take 81 mg by mouth daily.   CENTRUM SILVER ADULT 50+ PO Take 1 tablet by  mouth daily.   Eszopiclone 3 MG Tabs Take 3 mg by mouth at bedtime.   finasteride 5 MG tablet Commonly known as: PROSCAR Take 5 mg by mouth daily.   fluticasone 50 MCG/ACT nasal spray Commonly known as: FLONASE Place 2 sprays into both nostrils daily as needed for allergies.   gabapentin 400 MG capsule Commonly known as: NEURONTIN Take 300 mg by mouth 3 (three) times daily.   HYDROcodone-acetaminophen 10-325 MG tablet Commonly known as: NORCO Take 1-2 tablets by mouth every 6 (six) hours as needed for moderate pain. What changed:  how much to take when to take this reasons to take this   LORazepam 0.5 MG tablet Commonly known as: ATIVAN Take 0.5 mg by mouth in the morning and at bedtime.   metFORMIN 500 MG tablet Commonly known as: GLUCOPHAGE Take 500 mg  by mouth 2 (two) times daily with a meal.   methocarbamol 750 MG tablet Commonly known as: ROBAXIN Take 750 mg by mouth every 6 (six) hours as needed for muscle spasms.   oxyCODONE 5 MG immediate release tablet Commonly known as: Roxicodone Take 1 tablet (5 mg total) by mouth every 8 (eight) hours as needed.   pravastatin 40 MG tablet Commonly known as: PRAVACHOL Take 40 mg by mouth daily.   tiZANidine 4 MG tablet Commonly known as: ZANAFLEX Take 4 mg by mouth every 6 (six) hours as needed for muscle spasms.   venlafaxine XR 75 MG 24 hr capsule Commonly known as: EFFEXOR-XR Take 150 mg by mouth every morning.          Signed: Augustin Schooling 09/28/2021, 7:55 AM  [facility] is now [facility] [address], [place], [place], [place] [address] Phone: [place]

## 2021-09-28 NOTE — Evaluation (Signed)
Occupational Therapy Evaluation Patient Details Name: [name]. MRN: [id number] DOB: 28-Apr-1945 Today's Date: 09/28/2021   History of Present Illness [name]. is a 76 y.o. male with medical history significant for anxiety/depression, BPH, type 2 diabetes, and lumbar fusion with multiple joint replacements who was admitted on 09/27/2021 for RT rTSA.   Clinical Impression   Pt is a 76 year old male, s/p RT reverse shoulder replacement without functional use of RT dominant upper extremity secondary to effects of surgery and interscalene block and shoulder precautions. Therapist provided education and instruction to patient in regards to exercises, precautions, positioning, donning upper extremity clothing and bathing while maintaining shoulder precautions, ice and edema management including use of Cryo-cuff, and donning/doffing sling. Patient verbalized understanding and demonstrated as needed. Patient needed assistance to donn shirt, underwear, pants, socks and shoes and provided with instruction on compensatory strategies to perform ADLs. Patient limited by decreased ROM in RT shoulder so therefore will need some form of assistance at home. Patient verbalized and/or demonstrated understanding to all instruction. Pt asked to go ahead with OT prior to his spouse arriving as they live 30 min away and pt did not want to wait. Detailed handouts provided to reinforce all information for spouse's benefit. Patient to follow up with MD for further therapy needs.        Recommendations for follow up therapy are one component of a multi-disciplinary discharge planning process, led by the attending physician.  Recommendations may be updated based on patient status, additional functional criteria and insurance authorization.   Follow Up Recommendations  Follow physician's recommendations for discharge plan and follow up therapies    Assistance Recommended at Discharge Frequent or constant  Supervision/Assistance  Patient can return home with the following A little help with walking and/or transfers;A little help with bathing/dressing/bathroom    Functional Status Assessment  Patient has had a recent decline in their functional status and demonstrates the ability to make significant improvements in function in a reasonable and predictable amount of time.  Equipment Recommendations  None recommended by OT    Recommendations for Other Services       Precautions / Restrictions Precautions Precautions: Shoulder Shoulder Interventions: Shoulder sling/immobilizer;At all times;Off for dressing/bathing/exercises Precaution Booklet Issued: Yes (comment) Precaution Comments: "Sling at all times except ADL/exercise Yes  Non weight bearing Yes  AROM elbow, wrist and hand to tolerance Yes  PROM of shoulder No  AROM of shoulder No" Required Braces or Orthoses: Sling Restrictions Weight Bearing Restrictions: Yes RUE Weight Bearing: Non weight bearing Other Position/Activity Restrictions: RT UE      Mobility Bed Mobility Overal bed mobility: Modified Independent             General bed mobility comments: Cues for NWB to RUE    Transfers Overall transfer level: Needs assistance   Transfers: Sit to/from Stand Sit to Stand: Supervision           General transfer comment: supervision due to frequent falls and lightheadedness. Pt reports wife can provide supervision.      Balance Overall balance assessment: Mild deficits observed, not formally tested                                         ADL either performed or assessed with clinical judgement   ADL Overall ADL's : Needs assistance/impaired Eating/Feeding: Set up   Grooming:  Set up;Sitting   Upper Body Bathing: Moderate assistance;Cueing for UE precautions;Cueing for compensatory techniques   Lower Body Bathing: Supervison/ safety;Sitting/lateral leans;Sit to/from stand   Upper Body  Dressing : Minimal assistance;Cueing for UE precautions;Cueing for compensatory techniques;Sitting   Lower Body Dressing: Minimal assistance;Sit to/from stand;Sitting/lateral leans   Toilet Transfer: Supervision/safety;Ambulation   Toileting- Clothing Manipulation and Hygiene: Supervision/safety       Functional mobility during ADLs: Supervision/safety;Min guard General ADL Comments: Pt reported dizziness with standing ADLs. Pt knows to sit and allow it to pass from previous experience.     Vision Baseline Vision/History: 1 Wears glasses Ability to See in Adequate Light: 0 Adequate Vision Assessment?: No apparent visual deficits     Perception Perception Perception: Within Functional Limits   Praxis Praxis Praxis: Intact    Pertinent Vitals/Pain Pain Assessment Pain Assessment: No/denies pain     Hand Dominance Right (Ambidextrous)   Extremity/Trunk Assessment Upper Extremity Assessment Upper Extremity Assessment: RUE deficits/detail RUE: Unable to fully assess due to immobilization RUE Sensation: decreased light touch;decreased proprioception RUE Coordination: decreased fine motor   Lower Extremity Assessment Lower Extremity Assessment: Overall WFL for tasks assessed       Communication Communication Communication: No difficulties   Cognition Arousal/Alertness: Awake/alert Behavior During Therapy: WFL for tasks assessed/performed Overall Cognitive Status: Within Functional Limits for tasks assessed                                       General Comments       Exercises Other Exercises Other Exercises: Handout provided on hand/wrist/forearm/elbow exercises. Pt remains completly numb to RUE so demosntrated understanding of all exercises on LUE with cues for no P/AROM to RT shoulder.   Shoulder Instructions Shoulder Instructions Donning/doffing shirt without moving shoulder: Minimal assistance;Patient able to independently direct  caregiver Method for sponge bathing under operated UE: Minimal assistance;Patient able to independently direct caregiver Donning/doffing sling/immobilizer: Moderate assistance;Patient able to independently direct caregiver Correct positioning of sling/immobilizer: Independent;Patient able to independently direct caregiver (Pthad OT use his own phone to take pictures of sling position to assist spouse at home) Sling wearing schedule (on at all times/off for ADL's): Independent;Patient able to independently direct caregiver Proper positioning of operated UE when showering: Supervision/safety;Patient able to independently direct caregiver Dressing change: Moderate assistance;Patient able to independently direct caregiver Positioning of UE while sleeping: Independent;Patient able to independently direct caregiver    Home Living Family/patient expects to be discharged to:: Private residence Living Arrangements: Spouse/significant other Available Help at Discharge: Family;Available 24 hours/day Type of Home: House Home Access: Stairs to enter [employer] of Steps: 1 Entrance Stairs-Rails: None Home Layout: One level     Bathroom Shower/Tub: Occupational psychologist: Handicapped height Bathroom Accessibility: Yes   Home Equipment: Conservation officer, nature (2 wheels);Shower seat;Hospital bed;Adaptive equipment;Cane - single point;Cane - quad;BSC/3in1;Wheelchair - manual Adaptive Equipment: Long-handled sponge Additional Comments: Pt reports "lots of recliners" at the house.      Prior Functioning/Environment Prior Level of Function : Independent/Modified Independent;History of Falls (last six months)             Mobility Comments: Pt reports having "orthostatic hypotension" with h/o multiple falls. Pt reprots that he has learned how to cope by givng increased time between position transitions. ADLs Comments: Pt's spouse will be able to assist. Pt Independent at baseline         OT  Problem List: Decreased range of motion;Decreased strength;Decreased coordination;Impaired balance (sitting and/or standing);Decreased knowledge of precautions      OT Treatment/Interventions:      OT Goals(Current goals can be found in the care plan section) Acute Rehab OT Goals Patient Stated Goal: To go home today OT Goal Formulation: All assessment and education complete, DC therapy ADL Goals Additional ADL Goal #1: Pt will demonstrate UE/LE dressing, donning/doffing of sling, correct positioning of RT UE, and compensatory strategies for RT axilla hygiene, all while correctly following all shoulder post-op precautions/restrictions.  OT Frequency:      Co-evaluation              AM-PAC OT "6 Clicks" Daily Activity     Outcome Measure Help from another person eating meals?: A Little Help from another person taking care of personal grooming?: A Little Help from another person toileting, which includes using toliet, bedpan, or urinal?: A Little Help from another person bathing (including washing, rinsing, drying)?: A Little Help from another person to put on and taking off regular upper body clothing?: A Lot Help from another person to put on and taking off regular lower body clothing?: A Little 6 Click Score: 17   End of Session Equipment Utilized During Treatment: Other (comment) (RUE sling) Nurse Communication: Other (comment) (IV Lock request to work on UE dressing. RN to room to assist.)  Activity Tolerance: Patient tolerated treatment well Patient left: in bed (EOB)  OT Visit Diagnosis: Unsteadiness on feet (R26.81);Repeated falls (R29.6);History of falling (Z91.81)                Time: 2094-7096 OT Time Calculation (min): 40 min Charges:  OT General Charges $OT Visit: 1 Visit OT Evaluation $OT Eval Low Complexity: 1 Low OT Treatments $Self Care/Home Management : 23-37 mins  Anderson Malta, OT Acute Rehab Services Office: [phone] 09/28/2021  Julien Girt 09/28/2021, 9:24 AM

## 2021-09-28 NOTE — Plan of Care (Signed)
All discharge instructions were given to the Pt. All questions were answered. Discussed pain management. 

## 2021-09-28 NOTE — Op Note (Signed)
[name], [name] MEDICAL RECORD NO: [id number] ACCOUNT NO: [id number] DATE OF BIRTH: 07-Feb-1946 FACILITY: [facility]: [facility] PHYSICIAN: Doran Heater. Veverly Fells, MD  Operative Report   DATE OF PROCEDURE: 09/27/2021  PREOPERATIVE DIAGNOSIS:  Right shoulder rotator cuff tear arthropathy, end-stage.  POSTOPERATIVE DIAGNOSIS:  Right shoulder rotator cuff tear arthropathy, end-stage.  PROCEDURE PERFORMED:  Right reverse shoulder replacement using DePuy Delta Xtend prosthesis with no subscap repair.  ATTENDING SURGEON:  Doran Heater. Veverly Fells, MD  ASSISTANT:  Darol Destine, MD, PA-C, who was scrubbed in the entire procedure and necessary for satisfactory completion of surgery.  ANESTHESIA:  General anesthesia was used plus interscalene block.  ESTIMATED BLOOD LOSS:  Less than 100 mL  FLUID REPLACEMENT:  1000 mL crystalloid.  INSTRUMENT COUNTS:  Correct.  COMPLICATIONS:  No complications.  ANTIBIOTICS:  Perioperative antibiotics were given.  INDICATIONS:  The patient is a 76 year old male who presents with a history of worsening right shoulder pain secondary to end-stage rotator cuff tear arthropathy.  The patient has failed an extended period of conservative management, presents for  operative treatment to relieve pain and restore function.  Informed consent obtained.  DESCRIPTION OF PROCEDURE:  After an adequate level of anesthesia was achieved, the patient was positioned in modified beach chair position.  Right shoulder correctly identified and sterilely prepped and draped in the usual manner.  Timeout called,  verifying correct patient, correct site. We entered the patient's shoulder using a standard deltopectoral approach, starting at the coracoid process, extending down to the anterior humerus.  Dissection down through the subcutaneous tissues using Bovie.   We identified the cephalic vein and took that laterally with the deltoid.  Pectoralis was identified and retracted medially.   Conjoined tendon identified and retracted medially.  Deep retractors placed.  We tenodesed the biceps in situ with 0 Vicryl  figure-of-eight suture x2.  We then went ahead and released the subscapularis remnant off the lesser tuberosity and tagged the tissue for retraction and protection of the axillary nerve.  Once we peeled the inferior capsule off with progressive external  rotation, we delivered the humeral head out of the wound.  It was devoid of cartilage superiorly indicative of rubbing up against the acromion.  We entered the proximal humerus with a step cut drill.  With a 6 mm reamer, we reamed up to a size 12 and  then used the 12 mm T-handle guide and resected the humeral head at 20 degrees of retroversion on the right side.  Once we did that with the oscillating saw, we removed the head to the back table for use with bone grafting.  Next, we removed osteophytes  off the medial side of the humerus with a rongeur.  We then subluxed the humerus posteriorly, getting good exposure of the glenoid face.  We used our retractors and releases to release the capsule and also removed the glenoid labrum.  We were able to get  down to the native glenoid and removed the remaining cartilage with a Cobb elevator and rongeur.  We then found our center point for our guide pin and placed that under direct vision centered low on the glenoid face and angled slightly inferiorly.  Once  we were happy with our guidepin placement, we reamed for the metaglene baseplate with the motorized reamer.  We then did our peripheral hand reaming and then drilled out our central peg hole.  We impacted the metaglene baseplate into position, which was  seated low on the glenoid.  We then placed locked screws inferiorly, superiorly and anteriorly.  We had a 48 locked inferiorly, 42 superiorly and 24 anteriorly and then 18 nonlocked posteriorly.  Once we had the screws fully seated into the plate, we  locked them with peripheral locking  ring.  We then went ahead and selected a 42+0 standard glenosphere and attached that to the baseplate using a standard technique.  Once we had that secured, I did a finger sweep to make sure we had no soft tissue  caught up in that bearing.  We went back to the humeral side and reamed for the 2 right metaphysis and then trialled with the 12 stem 2 right metaphysis set on the 0 setting and impacted in 20 degrees of retroversion. We placed a 42+3 trial poly on the  humeral tray, reduced the shoulder and pleased with our soft tissue tensioning and balance and stability.  We removed all trial components from the humeral side.  We irrigated thoroughly, used available bone graft from the humeral head in impaction  grafting technique with the HA coated press fit 12 stem and 2 right metaphysis again set on the 0 setting and impacted in 20 degrees of retroversion.  The stem was very secure. We selected the real 42+3 poly, impacted on the humeral tray, reduced the  shoulder.  We were happy with our soft tissue tension and balance and stability.  We irrigated thoroughly and then repaired deltopectoral interval with 0 Vicryl suture followed by 2-0 Vicryl for subcutaneous closure and staples for skin.  The patient had  a little bit of a dermatologic reaction over the posterior shoulder.  We were little concerned about the wound.  This appeared almost psoriatic in its appearance, a little crusty and maculopapular, but did not appear grossly infected, looked more like a  contact dermatitis.  Once we had the staple line complete, we used a sterile bandage and a shoulder sling.  The patient transported to recovery room in stable condition.   VAI D: 09/27/2021 4:54:02 pm T: 09/28/2021 12:21:00 am  JOB: [id number]/ [id number]

## 2021-09-28 NOTE — Progress Notes (Signed)
Transition of Care [facility]) Screening Note  Patient Details  Name: [name]. Date of Birth: June 07, 1945  Transition of Care [facility]) CM/SW Contact:    Sherie Don, LCSW Phone Number: 09/28/2021, 8:49 AM  Transition of Care Department [facility]) has reviewed patient and no TOC needs have been identified at this time. We will continue to monitor patient advancement through interdisciplinary progression rounds. If new patient transition needs arise, please place a TOC consult.

## 2021-09-28 NOTE — Progress Notes (Signed)
Orthopedics Progress Note  Subjective: No pain this AM, block still working  Objective:  Vitals:   09/28/21 0132 09/28/21 0617  BP: 131/83 (!) 145/94  Pulse: 83 75  Resp: 18 18  Temp: 98.2 F (36.8 C) 98.2 F (36.8 C)  SpO2: 96% 99%    General: Awake and alert  Musculoskeletal: Right shoulder dressing changed and wound CDI Neurovascularly intact  Lab Results  Component Value Date   WBC 5.0 09/22/2021   HGB 10.8 (L) 09/28/2021   HCT 33.6 (L) 09/28/2021   MCV 96.3 09/22/2021   PLT 194 09/22/2021       Component Value Date/Time   NA 139 09/28/2021 0400   K 5.4 (H) 09/28/2021 0400   CL 106 09/28/2021 0400   CO2 27 09/28/2021 0400   GLUCOSE 190 (H) 09/28/2021 0400   BUN 19 09/28/2021 0400   CREATININE 0.82 09/28/2021 0400   CREATININE 0.72 05/19/2020 1129   CALCIUM 9.0 09/28/2021 0400   GFRNONAA >60 09/28/2021 0400   GFRNONAA >60 05/19/2020 1129   GFRAA >60 07/25/2019 0440   GFRAA >60 02/04/2019 0854    Lab Results  Component Value Date   INR 1.0 07/23/2019   INR 1.07 10/14/2014   INR 1.00 03/29/2013    Assessment/Plan: POD #1 s/p Procedure(s): REVERSE SHOULDER ARTHROPLASTY Stable this AM, home after therapy Follow up in two weeks in the office  Remo Lipps R. Veverly Fells, MD 09/28/2021 7:54 AM

## 2021-09-29 ENCOUNTER — Encounter (HOSPITAL_COMMUNITY): Payer: Self-pay | Admitting: Orthopedic Surgery

## 2021-10-12 DIAGNOSIS — Z4789 Encounter for other orthopedic aftercare: Secondary | ICD-10-CM | POA: Diagnosis not present

## 2021-10-26 DIAGNOSIS — N401 Enlarged prostate with lower urinary tract symptoms: Secondary | ICD-10-CM | POA: Diagnosis not present

## 2021-10-26 DIAGNOSIS — R351 Nocturia: Secondary | ICD-10-CM | POA: Diagnosis not present

## 2021-10-26 DIAGNOSIS — R3914 Feeling of incomplete bladder emptying: Secondary | ICD-10-CM | POA: Diagnosis not present

## 2021-10-26 DIAGNOSIS — N3941 Urge incontinence: Secondary | ICD-10-CM | POA: Diagnosis not present

## 2021-11-08 DIAGNOSIS — F3130 Bipolar disorder, current episode depressed, mild or moderate severity, unspecified: Secondary | ICD-10-CM | POA: Diagnosis not present

## 2021-11-09 DIAGNOSIS — Z4789 Encounter for other orthopedic aftercare: Secondary | ICD-10-CM | POA: Diagnosis not present

## 2021-11-10 DIAGNOSIS — H52223 Regular astigmatism, bilateral: Secondary | ICD-10-CM | POA: Diagnosis not present

## 2021-11-10 DIAGNOSIS — H524 Presbyopia: Secondary | ICD-10-CM | POA: Diagnosis not present

## 2021-11-10 DIAGNOSIS — H2513 Age-related nuclear cataract, bilateral: Secondary | ICD-10-CM | POA: Diagnosis not present

## 2021-11-10 DIAGNOSIS — E119 Type 2 diabetes mellitus without complications: Secondary | ICD-10-CM | POA: Diagnosis not present

## 2021-11-10 DIAGNOSIS — H5203 Hypermetropia, bilateral: Secondary | ICD-10-CM | POA: Diagnosis not present

## 2021-11-17 ENCOUNTER — Other Ambulatory Visit: Payer: Self-pay

## 2021-11-17 ENCOUNTER — Ambulatory Visit: Payer: Medicare Other | Attending: Orthopedic Surgery

## 2021-11-17 DIAGNOSIS — M25511 Pain in right shoulder: Secondary | ICD-10-CM | POA: Insufficient documentation

## 2021-11-17 DIAGNOSIS — M6281 Muscle weakness (generalized): Secondary | ICD-10-CM | POA: Insufficient documentation

## 2021-11-17 DIAGNOSIS — M25611 Stiffness of right shoulder, not elsewhere classified: Secondary | ICD-10-CM | POA: Diagnosis not present

## 2021-11-17 NOTE — Therapy (Signed)
OUTPATIENT PHYSICAL THERAPY SHOULDER EVALUATION   Patient Name: [name]. MRN: [id number] DOB:04-08-1945, 76 y.o., male Today's Date: 11/17/2021   PT End of Session - 11/17/21 0906     Visit Number 1    Number of Visits 8    Date for PT Re-Evaluation 12/17/21    PT Start Time 0908    PT Stop Time 1000    PT Time Calculation (min) 52 min    Activity Tolerance Patient tolerated treatment well    Behavior During Therapy [facility] for tasks assessed/performed             Past Medical History:  Diagnosis Date   Arthritis    Asthma    anxiety, tree/grass pollen   BPH (benign prostatic hyperplasia)    Bronchitis    hx of   Bulge of cervical disc without myelopathy    Chest pain    a. 06/2015 MV: EF 56%, no ischemia/infarct.   Complication of anesthesia 91/47/8295   no complications but just says he typically needs "more than expected" to anesthetize; needs CPAP (setting 10) in recovery   Depression    ADD   Insomnia    Low blood pressure    Mitral valve prolapse    a. 06/2015 Echo: EF 65-70%, mild LVH, no rwma, mild MVP of ant leaflet and mild to mod posteriorly directed MR, mildly dil La.   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   JOINT REPLACEMENT     Left, Right Knee, Right hip   KNEE ARTHROSCOPY     right x 3-4, left x 2   KNEE ARTHROTOMY  ~1991   right   LUMBAR FUSION  04/03/2013   [name], L5-S1   POLYPECTOMY     vocal cords 1992   REVERSE SHOULDER ARTHROPLASTY Right 09/27/2021   Procedure: REVERSE SHOULDER ARTHROPLASTY;  Surgeon: Netta Cedars, MD;  Location: [facility]  ORS;  Service: Orthopedics;  Laterality: Right;  with ISB   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   Procedure: LEFT TOTAL HIP ARTHROPLASTY ANTERIOR APPROACH;  Surgeon: [name], MD;  Location: [facility] ORS;  Service: Orthopedics;  Laterality: Left;   TOTAL HIP REVISION  04/22/2011   Procedure: TOTAL HIP REVISION;  Surgeon: Gearlean Alf, MD;  Location: [facility] ORS;  Service: Orthopedics;  Laterality: Right;   Patient Active Problem List   Diagnosis Date Noted   S/P shoulder replacement, right 09/27/2021   Pre-operative respiratory examination 11/13/2019   Ambulatory dysfunction 07/24/2019   Closed nondisplaced fracture of condyle of right femur (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 11/12/2015   Left ventricular hypertrophy 10/03/2015   Mixed hyperlipidemia 10/03/2015   PVCs (premature ventricular contractions) 07/13/2015   Obesity (BMI 30.0-34.9) 07/13/2015   Chest pain 06/29/2015   Uncontrolled diabetes mellitus without complication, without long-term current use of insulin 06/29/2015   Depression 06/29/2015   BPH (benign prostatic hyperplasia) 06/29/2015   OSA on CPAP 12/26/2006   REFERRING PROVIDER: Netta Cedars, MD  REFERRING DIAG: Encounter for other orthopedic aftercare  THERAPY DIAG:  Acute pain of right shoulder  Stiffness of right shoulder, not elsewhere classified  Muscle weakness (generalized)  Rationale for Evaluation and Treatment  Rehabilitation  ONSET DATE: 09/27/21  SUBJECTIVE:                                                                                                                                                                                      SUBJECTIVE STATEMENT: Patient reports that he had a reverse total shoulder replacement on 09/27/21. He notes that his shoulder is moving well when he is reaching forward. However, he notes that reaching behind him is the most painful and difficult.   PERTINENT HISTORY: Orthostatic hypotension, DM, depression, history of falling  PAIN:  Are you having pain? Yes: NPRS scale: 8-9/10 Pain location: posterior right shoulder Pain description: sharp Aggravating factors: reaching behind him  Relieving factors: getting out of the painful position  PRECAUTIONS: Fall  WEIGHT BEARING RESTRICTIONS No  FALLS:  Has patient fallen in last 6 months? Yes. Number of falls 2 with one fall after surgery when he was getting out of his bed.  LIVING ENVIRONMENT: Lives with: lives with their spouse Lives in: House/apartment Has following equipment at home: None  OCCUPATION: Retired  PLOF: Independent  PATIENT GOALS reach into his back pocket, use his right hand to buckle his seatbelt, sleep on his right side (unable to at this time), and be able to put his belt on  OBJECTIVE:   PATIENT SURVEYS:  FOTO 61.67  COGNITION:  Overall cognitive status: Within functional limits for tasks assessed     SENSATION: WFL  POSTURE: WFL   UPPER EXTREMITY ROM:   Active ROM Right eval Left eval  Shoulder flexion 124 135  Shoulder extension    Shoulder abduction 110 125  Shoulder adduction    Shoulder internal rotation To PSIS; limited by pain and stretch To inferior border of right scapula  Shoulder external rotation To T2 To T1   Elbow flexion    Elbow extension    Wrist flexion    Wrist extension  Wrist ulnar deviation    Wrist radial deviation    Wrist pronation     Wrist supination    (Blank rows = not tested)  UPPER EXTREMITY MMT:  MMT Right eval Left eval  Shoulder flexion 4-/5 3+/5  Shoulder extension    Shoulder abduction    Shoulder adduction    Shoulder internal rotation 4/5 4+/5  Shoulder external rotation 3+/5 with familiar pain 4-/5  Middle trapezius    Lower trapezius    Elbow flexion 4+/5 4+/5  Elbow extension 4/5 4+/5  Wrist flexion    Wrist extension    Wrist ulnar deviation    Wrist radial deviation    Wrist pronation    Wrist supination    Grip strength (lbs)    (Blank rows = not tested)  JOINT MOBILITY TESTING:  Right glenohumeral and AC joint: WFL and nonpainful   PALPATION:  TTP: right bicep, infraspinatus, deltoid, and and subscapularis insertion    TODAY'S TREATMENT:                                    9/6 EXERCISE LOG  Exercise Repetitions and Resistance Comments  Self STM with tennis ball    Isometric ER  15 reps w/ 5 second hold   Isometric extension 15 reps w/ 5 second hold    Wall slides  5 reps Limited by infraspinatus discomfort       Blank cell = exercise not performed today    PATIENT EDUCATION: Education details: heat vs ice, TENS, POC, healing, prognosis, HEP Person educated: Patient Education method: Explanation Education comprehension: verbalized understanding   HOME EXERCISE PROGRAM: [email or web address]  ASSESSMENT:  CLINICAL IMPRESSION: Patient is a 76 y.o. male who was seen today for physical therapy evaluation and treatment following a right reverse total shoulder arthroplasty on 09/27/21. He presented with moderate pain severity and irritability with functional internal rotation being the most aggravating to her familiar symptoms. He also experienced increased tenderness to palpation along his right infraspinatus. He was provided a HEP which he was able to properly demonstrate. He reported feeling comfortable with these interventions. He continues to require skilled physical therapy to  address his impairments to return to his prior level of function.    OBJECTIVE IMPAIRMENTS decreased activity tolerance, decreased ROM, decreased strength, impaired UE functional use, and pain.   ACTIVITY LIMITATIONS lifting, dressing, and reach over head  PARTICIPATION LIMITATIONS: community activity  PERSONAL FACTORS 3+ comorbidities: Orthostatic hypotension, DM, depression, history of falling  are also affecting patient's functional outcome.   REHAB POTENTIAL: Good  CLINICAL DECISION MAKING: Stable/uncomplicated  EVALUATION COMPLEXITY: Low   GOALS: Goals reviewed with patient? No  LONG TERM GOALS: Target date:12/15/2021   Patient will be independent with his HEP.  Baseline:  Goal status: INITIAL  2.  Patient will be able to demonstrate at least 120 degrees of active right shoulder abduction for improved function reaching overhead.  Baseline:  Goal status: INITIAL  3.  Patient will be able to demonstrate sufficient right shoulder functional internal rotation to tuck the back of his shirt into his pants for improved function getting dressed.  Baseline:  Goal status: INITIAL  4.  Patient will be able to complete his daily activities without his familiar pain exceeding 5/10. Baseline:  Goal status: INITIAL  PLAN: PT FREQUENCY: 2x/week  PT DURATION: 4 weeks  PLANNED INTERVENTIONS: Therapeutic exercises, Therapeutic activity, Neuromuscular re-education, Patient/Family education,  Self Care, Joint mobilization, Electrical stimulation, Cryotherapy, Moist heat, Taping, Vasopneumatic device, Manual therapy, and Re-evaluation  PLAN FOR NEXT SESSION: pulleys, resisted IR/ER/extension, and modalities as needed   Darlin Coco, PT 11/17/2021, 10:28 AM

## 2021-11-18 DIAGNOSIS — Z23 Encounter for immunization: Secondary | ICD-10-CM | POA: Diagnosis not present

## 2021-11-19 ENCOUNTER — Ambulatory Visit: Payer: Medicare Other

## 2021-11-19 DIAGNOSIS — M25611 Stiffness of right shoulder, not elsewhere classified: Secondary | ICD-10-CM | POA: Diagnosis not present

## 2021-11-19 DIAGNOSIS — M25511 Pain in right shoulder: Secondary | ICD-10-CM

## 2021-11-19 DIAGNOSIS — M6281 Muscle weakness (generalized): Secondary | ICD-10-CM | POA: Diagnosis not present

## 2021-11-19 DIAGNOSIS — G894 Chronic pain syndrome: Secondary | ICD-10-CM | POA: Diagnosis not present

## 2021-11-19 DIAGNOSIS — M4326 Fusion of spine, lumbar region: Secondary | ICD-10-CM | POA: Diagnosis not present

## 2021-11-19 NOTE — Therapy (Signed)
OUTPATIENT PHYSICAL THERAPY SHOULDER TREATMENT   Patient Name: [name]. MRN: [id number] DOB:06-Dec-1945, 76 y.o., male Today's Date: 11/19/2021   PT End of Session - 11/19/21 0919     Visit Number 2    Number of Visits 8    Date for PT Re-Evaluation 12/17/21    PT Start Time 0900    PT Stop Time 1002    PT Time Calculation (min) 62 min    Activity Tolerance Patient tolerated treatment well    Behavior During Therapy WFL for tasks assessed/performed              Past Medical History:  Diagnosis Date   Arthritis    Asthma    anxiety, tree/grass pollen   BPH (benign prostatic hyperplasia)    Bronchitis    hx of   Bulge of cervical disc without myelopathy    Chest pain    a. 06/2015 MV: EF 56%, no ischemia/infarct.   Complication of anesthesia 40/98/1191   no complications but just says he typically needs "more than expected" to anesthetize; needs CPAP (setting 10) in recovery   Depression    ADD   Insomnia    Low blood pressure    Mitral valve prolapse    a. 06/2015 Echo: EF 65-70%, mild LVH, no rwma, mild MVP of ant leaflet and mild to mod posteriorly directed MR, mildly dil La.   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   JOINT REPLACEMENT     Left, Right Knee, Right hip   KNEE ARTHROSCOPY     right x 3-4, left x 2   KNEE ARTHROTOMY  ~1991   right   LUMBAR FUSION  04/03/2013   [name], L5-S1   POLYPECTOMY     vocal cords 1992   REVERSE SHOULDER ARTHROPLASTY Right 09/27/2021   Procedure: REVERSE SHOULDER ARTHROPLASTY;  Surgeon: Netta Cedars, MD;  Location: [facility]  ORS;  Service: Orthopedics;  Laterality: Right;  with ISB   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   Procedure: LEFT TOTAL HIP ARTHROPLASTY ANTERIOR APPROACH;  Surgeon: [name], MD;  Location: [facility];  Service: Orthopedics;  Laterality: Left;   TOTAL HIP REVISION  04/22/2011   Procedure: TOTAL HIP REVISION;  Surgeon: Gearlean Alf, MD;  Location: [facility];  Service: Orthopedics;  Laterality: Right;   Patient Active Problem List   Diagnosis Date Noted   S/P shoulder replacement, right 09/27/2021   Pre-operative respiratory examination 11/13/2019   Ambulatory dysfunction 07/24/2019   Closed nondisplaced fracture of condyle of right femur ([place]) 07/24/2019   Bulge of cervical disc without myelopathy    Falls 07/23/2019   Cough 02/20/2019   Absolute anemia 02/04/2019   Iron deficiency anemia 02/04/2019   Abnormal findings on diagnostic imaging of lung 11/27/2018   ILD (interstitial lung disease) ([place]) 11/27/2018   Mild obesity 08/05/2017   Type 2 diabetes mellitus treated without insulin ([place]) 08/05/2017   MVP (mitral valve prolapse) 08/05/2017   Orthostatic  hypotension 01/18/2017   Upper airway cough syndrome 11/23/2016   Cough variant asthma 11/23/2016   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 11/12/2015   Left ventricular hypertrophy 10/03/2015   Mixed hyperlipidemia 10/03/2015   PVCs (premature ventricular contractions) 07/13/2015   Obesity (BMI 30.0-34.9) 07/13/2015   Chest pain 06/29/2015   Uncontrolled diabetes mellitus without complication, without long-term current use of insulin 06/29/2015   Depression 06/29/2015   BPH (benign prostatic hyperplasia) 06/29/2015   OSA on CPAP 12/26/2006   REFERRING PROVIDER: Netta Cedars, MD  REFERRING DIAG: Encounter for other orthopedic aftercare  THERAPY DIAG:  Acute pain of right shoulder  Stiffness of right shoulder, not elsewhere classified  Rationale for Evaluation and Treatment Rehabilitation  ONSET DATE:  09/27/21  SUBJECTIVE:                                                                                                                                                                                      SUBJECTIVE STATEMENT: Patient reports that his shoulder is really hurting yesterday. However,  he notes that her arm is a little better. He feels that he did too much after his evaluation which caused his shoulder to hurt a lot the next day.   PERTINENT HISTORY: Orthostatic hypotension, DM, depression, history of falling  PAIN:  Are you having pain? Yes: NPRS scale: 3-4/10 Pain location: posterior right shoulder Pain description: sharp Aggravating factors: reaching behind him  Relieving factors: getting out of the painful position  PRECAUTIONS: Fall  WEIGHT BEARING RESTRICTIONS No  FALLS:  Has patient fallen in last 6 months? Yes. Number of falls 2 with one fall after surgery when he was getting out of his bed.  LIVING ENVIRONMENT: Lives with: lives with their spouse Lives in: House/apartment Has following equipment at home: None  OCCUPATION: Retired  PLOF: [place] reach into his back pocket, use his right hand to buckle his seatbelt, sleep on his right side (unable to at this time), and be able to put his belt on  OBJECTIVE: all objective measures were performed at his initial evaluation on 11/17/21 unless otherwise noted   PATIENT SURVEYS:  FOTO 61.67  COGNITION:  Overall cognitive status: Within functional limits for tasks assessed     SENSATION: WFL  POSTURE: WFL   UPPER EXTREMITY ROM:   Active ROM Right eval Left eval  Shoulder flexion 124 135  Shoulder extension    Shoulder abduction 110 125  Shoulder adduction    Shoulder internal rotation To PSIS; limited by pain and stretch To inferior border of right scapula  Shoulder external rotation To T2 To T1   Elbow flexion  Elbow extension    Wrist flexion    Wrist extension    Wrist  ulnar deviation    Wrist radial deviation    Wrist pronation    Wrist supination    (Blank rows = not tested)  UPPER EXTREMITY MMT:  MMT Right eval Left eval  Shoulder flexion 4-/5 3+/5  Shoulder extension    Shoulder abduction    Shoulder adduction    Shoulder internal rotation 4/5 4+/5  Shoulder external rotation 3+/5 with familiar pain 4-/5  Middle trapezius    Lower trapezius    Elbow flexion 4+/5 4+/5  Elbow extension 4/5 4+/5  Wrist flexion    Wrist extension    Wrist ulnar deviation    Wrist radial deviation    Wrist pronation    Wrist supination    Grip strength (lbs)    (Blank rows = not tested)  JOINT MOBILITY TESTING:  Right glenohumeral and AC joint: WFL and nonpainful   PALPATION:  TTP: right bicep, infraspinatus, deltoid, and and subscapularis insertion    TODAY'S TREATMENT:                                    9/8 EXERCISE LOG  Exercise Repetitions and Resistance Comments  Pulleys  5 minutes    Wall ladder 3 minutes; 35# max w/ 5 second hold   Resisted shoulder extension Red t-band; 20 reps RUE only  Resisted IR Red t-band x 2 minutes 2 seconds going in; 2 seconds going out  Resisted ER  Yellow t-band x 20 reps 2 seconds going in; 2 seconds going out  Standing cane flexion to 90 degrees 20 reps @ 5 lbs    Cane bicep curl  2 minutes @ 5 lbs   [place] bending (up and down)  Red t-bar x 1.5 minutes each    Blank cell = exercise not performed today  Manual Therapy Soft Tissue Mobilization: right infraspinatus, for improved soft tissue extensibility and reduced soreness   Modalities  Date:  [place]aso: Shoulder, 34 degrees; low pressure, 15 mins, Pain                                   9/6 EXERCISE LOG  Exercise Repetitions and Resistance Comments  Self STM with tennis ball    Isometric ER  15 reps w/ 5 second hold   Isometric extension 15 reps w/ 5 second hold    Wall slides  5 reps Limited by infraspinatus discomfort       Blank cell = exercise not  performed today    PATIENT EDUCATION: Education details: heat vs ice, TENS, POC, healing, prognosis, HEP Person educated: Patient Education method: Explanation Education comprehension: verbalized understanding   HOME EXERCISE PROGRAM: [email or web address]  ASSESSMENT:  CLINICAL IMPRESSION: Patient was introduced to multiple new interventions for improved right shoulder mobility and strength with moderate difficulty. He required moderate multimodal cueing with multiple interventions for proper biomechanics throughout each intervention as his form would deteriorate as he lost focus. Fatigue was his primary limitation with today's interventions as he reported no significant pain or discomfort with today's interventions. Manual therapy focused on soft tissue mobilization to his right infraspinatus which was moderately effective at reducing his familiar pain and soreness. He reported that his shoulder felt really good upon the conclusion of treatment. He continues to require skilled  physical therapy to address his remaining impairments to return to his prior level of function.    OBJECTIVE IMPAIRMENTS decreased activity tolerance, decreased ROM, decreased strength, impaired UE functional use, and pain.   ACTIVITY LIMITATIONS lifting, dressing, and reach over head  PARTICIPATION LIMITATIONS: community activity  PERSONAL FACTORS 3+ comorbidities: Orthostatic hypotension, DM, depression, history of falling  are also affecting patient's functional outcome.   REHAB POTENTIAL: Good  CLINICAL DECISION MAKING: Stable/uncomplicated  EVALUATION COMPLEXITY: Low   GOALS: Goals reviewed with patient? No  LONG TERM GOALS: Target date:12/15/2021   Patient will be independent with his HEP.  Baseline:  Goal status: INITIAL  2.  Patient will be able to demonstrate at least 120 degrees of active right shoulder abduction for improved function reaching overhead.  Baseline:  Goal status: INITIAL  3.  Patient  will be able to demonstrate sufficient right shoulder functional internal rotation to tuck the back of his shirt into his pants for improved function getting dressed.  Baseline:  Goal status: INITIAL  4.  Patient will be able to complete his daily activities without his familiar pain exceeding 5/10. Baseline:  Goal status: INITIAL  PLAN: PT FREQUENCY: 2x/week  PT DURATION: 4 weeks  PLANNED INTERVENTIONS: Therapeutic exercises, Therapeutic activity, Neuromuscular re-education, Patient/Family education, Self Care, Joint mobilization, Electrical stimulation, Cryotherapy, Moist heat, Taping, Vasopneumatic device, Manual therapy, and Re-evaluation  PLAN FOR NEXT SESSION: pulleys, resisted IR/ER/extension, and modalities as needed   Darlin Coco, PT 11/19/2021, 12:48 PM

## 2021-11-23 ENCOUNTER — Encounter: Payer: Self-pay | Admitting: Physical Therapy

## 2021-11-23 ENCOUNTER — Ambulatory Visit: Payer: Medicare Other | Admitting: Physical Therapy

## 2021-11-23 DIAGNOSIS — M25511 Pain in right shoulder: Secondary | ICD-10-CM | POA: Diagnosis not present

## 2021-11-23 DIAGNOSIS — M25611 Stiffness of right shoulder, not elsewhere classified: Secondary | ICD-10-CM

## 2021-11-23 DIAGNOSIS — M6281 Muscle weakness (generalized): Secondary | ICD-10-CM | POA: Diagnosis not present

## 2021-11-23 NOTE — Therapy (Signed)
OUTPATIENT PHYSICAL THERAPY SHOULDER TREATMENT   Patient Name: [name]. MRN: [id number] DOB:02-21-1946, 77 y.o., male Today's Date: 11/23/2021   PT End of Session - 11/23/21 0904     Visit Number 3    Number of Visits 8    Date for PT Re-Evaluation 12/17/21    PT Start Time 0902    PT Stop Time 0945    PT Time Calculation (min) 43 min    Activity Tolerance Patient tolerated treatment well    Behavior During Therapy [facility] for tasks assessed/performed              Past Medical History:  Diagnosis Date   Arthritis    Asthma    anxiety, tree/grass pollen   BPH (benign prostatic hyperplasia)    Bronchitis    hx of   Bulge of cervical disc without myelopathy    Chest pain    a. 06/2015 MV: EF 56%, no ischemia/infarct.   Complication of anesthesia 14/43/1540   no complications but just says he typically needs "more than expected" to anesthetize; needs CPAP (setting 10) in recovery   Depression    ADD   Insomnia    Low blood pressure    Mitral valve prolapse    a. 06/2015 Echo: EF 65-70%, mild LVH, no rwma, mild MVP of ant leaflet and mild to mod posteriorly directed MR, mildly dil La.   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   JOINT REPLACEMENT     Left, Right Knee, Right hip   KNEE ARTHROSCOPY     right x 3-4, left x 2   KNEE ARTHROTOMY  ~1991   right   LUMBAR FUSION  04/03/2013   [name], L5-S1   POLYPECTOMY     vocal cords 1992   REVERSE SHOULDER ARTHROPLASTY Right 09/27/2021   Procedure: REVERSE SHOULDER ARTHROPLASTY;  Surgeon: Netta Cedars, MD;  Location:  [facility];  Service: Orthopedics;  Laterality: Right;  with ISB   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   Procedure: LEFT TOTAL HIP ARTHROPLASTY ANTERIOR APPROACH;  Surgeon: [name], MD;  Location: [facility];  Service: Orthopedics;  Laterality: Left;   TOTAL HIP REVISION  04/22/2011   Procedure: TOTAL HIP REVISION;  Surgeon: Gearlean Alf, MD;  Location: [facility];  Service: Orthopedics;  Laterality: Right;   Patient Active Problem List   Diagnosis Date Noted   S/P shoulder replacement, right 09/27/2021   Pre-operative respiratory examination 11/13/2019   Ambulatory dysfunction 07/24/2019   Closed nondisplaced fracture of condyle of right femur ([place]) 07/24/2019   Bulge of cervical disc without myelopathy    Falls 07/23/2019   Cough 02/20/2019   Absolute anemia 02/04/2019   Iron deficiency anemia 02/04/2019   Abnormal findings on diagnostic imaging of lung 11/27/2018   ILD (interstitial lung disease) ([place]) 11/27/2018   Mild obesity 08/05/2017   Type 2 diabetes mellitus treated without insulin ([place]) 08/05/2017   MVP (mitral valve prolapse) 08/05/2017   Orthostatic  hypotension 01/18/2017   Upper airway cough syndrome 11/23/2016   Cough variant asthma 11/23/2016   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 11/12/2015   Left ventricular hypertrophy 10/03/2015   Mixed hyperlipidemia 10/03/2015   PVCs (premature ventricular contractions) 07/13/2015   Obesity (BMI 30.0-34.9) 07/13/2015   Chest pain 06/29/2015   Uncontrolled diabetes mellitus without complication, without long-term current use of insulin 06/29/2015   Depression 06/29/2015   BPH (benign prostatic hyperplasia) 06/29/2015   OSA on CPAP 12/26/2006   REFERRING PROVIDER: Netta Cedars, MD  REFERRING DIAG: Encounter for other orthopedic aftercare  THERAPY DIAG:  Acute pain of right shoulder  Stiffness of right shoulder, not elsewhere classified  Rationale for Evaluation and Treatment Rehabilitation  ONSET DATE:  09/27/21  SUBJECTIVE:                                                                                                                                                                                      SUBJECTIVE STATEMENT: Reports more acromian pain last night but admits to showing his sister in law how good he could use the R shoulder and thinks that contributed to the pain.  PERTINENT HISTORY: Orthostatic hypotension, DM, depression, history of falling  PAIN:  Are you having pain? Yes: NPRS scale: 6/10 Pain location: posterior right shoulder Pain description: throbbing Aggravating factors: reaching behind him  Relieving factors: getting out of the painful position  PRECAUTIONS: Fall  WEIGHT BEARING RESTRICTIONS No   OBJECTIVE:   PATIENT SURVEYS:  FOTO 61.67  UPPER EXTREMITY ROM:   Active ROM Right eval Left eval  Shoulder flexion 124 135  Shoulder extension    Shoulder abduction 110 125  Shoulder adduction    Shoulder internal rotation To PSIS; limited by pain and stretch To inferior border of right scapula  Shoulder external rotation To T2 To T1   Elbow flexion    Elbow extension    Wrist flexion    Wrist extension    Wrist ulnar deviation    Wrist radial deviation    Wrist pronation    Wrist supination    (Blank rows = not tested)  UPPER EXTREMITY MMT:  MMT Right eval Left eval  Shoulder flexion 4-/5 3+/5  Shoulder extension    Shoulder abduction    Shoulder adduction    Shoulder internal rotation 4/5 4+/5  Shoulder external rotation 3+/5 with familiar pain 4-/5  Middle trapezius    Lower trapezius    Elbow flexion 4+/5 4+/5  Elbow extension 4/5 4+/5  Wrist flexion    Wrist extension    Wrist ulnar deviation    Wrist radial deviation    Wrist pronation  Wrist supination    Grip strength (lbs)    (Blank rows = not tested)   TODAY'S TREATMENT:                                    9/12 EXERCISE LOG  Exercise Repetitions and Resistance  Comments  Pulleys  5 minutes    Wall ladder 3 minutes; 34-35 max w/ 5 second hold   Resisted shoulder extension Red t-band; 20 reps RUE only  Resisted shoulder ER Yellow x15 reps   Resisted shoulder horizontal abduct Yellow x15 reps   Wall wash X5 reps Stopped due to fatigue  Bicep curl 1# upper cut x20 reps   Short lever abduction 1# x20 reps    Blank cell = exercise not performed today    Date: 11/23/2021 Vaso: Shoulder, 34 degrees; low pressure, 15 mins, Pain  PATIENT EDUCATION: Education details: heat vs ice, TENS, POC, healing, prognosis, HEP Person educated: Patient Education method: Explanation Education comprehension: verbalized understanding   HOME EXERCISE PROGRAM: HJZWFLLF  ASSESSMENT:  CLINICAL IMPRESSION: Patient arrived with discomfort in the last few days along his R acromian ridge. Patient able to continue therex session but limited by fatigue and discomfort. Shoulder weakness notable with shoulder ER and horizontal abduction. Patient required VCs for slow speed of reps to avoid excessive pain. Therex session ended due to muscle fatigue. Normal vasopnuematic response noted following removal of the modality. 3/10 R shoulder pain reported at end of session.   OBJECTIVE IMPAIRMENTS decreased activity tolerance, decreased ROM, decreased strength, impaired UE functional use, and pain.   ACTIVITY LIMITATIONS lifting, dressing, and reach over head  PARTICIPATION LIMITATIONS: community activity  PERSONAL FACTORS 3+ comorbidities: Orthostatic hypotension, DM, depression, history of falling  are also affecting patient's functional outcome.   REHAB POTENTIAL: Good  CLINICAL DECISION MAKING: Stable/uncomplicated  EVALUATION COMPLEXITY: Low   GOALS: Goals reviewed with patient? No  LONG TERM GOALS: Target date:12/15/2021   Patient will be independent with his HEP.  Baseline:  Goal status: INITIAL  2.  Patient will be able to demonstrate at least 120 degrees of  active right shoulder abduction for improved function reaching overhead.  Baseline:  Goal status: INITIAL  3.  Patient will be able to demonstrate sufficient right shoulder functional internal rotation to tuck the back of his shirt into his pants for improved function getting dressed.  Baseline:  Goal status: INITIAL  4.  Patient will be able to complete his daily activities without his familiar pain exceeding 5/10. Baseline:  Goal status: INITIAL  PLAN: PT FREQUENCY: 2x/week  PT DURATION: 4 weeks  PLANNED INTERVENTIONS: Therapeutic exercises, Therapeutic activity, Neuromuscular re-education, Patient/Family education, Self Care, Joint mobilization, Electrical stimulation, Cryotherapy, Moist heat, Taping, Vasopneumatic device, Manual therapy, and Re-evaluation  PLAN FOR NEXT SESSION: pulleys, resisted IR/ER/extension, and modalities as needed   Standley Brooking, PTA 11/23/2021, 11:10 AM

## 2021-11-25 ENCOUNTER — Ambulatory Visit: Payer: Medicare Other

## 2021-11-25 DIAGNOSIS — M6281 Muscle weakness (generalized): Secondary | ICD-10-CM | POA: Diagnosis not present

## 2021-11-25 DIAGNOSIS — M25511 Pain in right shoulder: Secondary | ICD-10-CM | POA: Diagnosis not present

## 2021-11-25 DIAGNOSIS — M25611 Stiffness of right shoulder, not elsewhere classified: Secondary | ICD-10-CM | POA: Diagnosis not present

## 2021-11-25 NOTE — Therapy (Signed)
OUTPATIENT PHYSICAL THERAPY SHOULDER TREATMENT   Patient Name: [name]. MRN: [id number] DOB:[birth year], 76 y.o., male Today's Date: 11/25/2021   PT End of Session - 11/25/21 0902     Visit Number 4    Number of Visits 8    Date for PT Re-Evaluation 12/17/21    PT Start Time 0858    PT Stop Time 0943    PT Time Calculation (min) 45 min    Activity Tolerance Patient tolerated treatment well    Behavior During Therapy [facility] for tasks assessed/performed               Past Medical History:  Diagnosis Date   Arthritis    Asthma    anxiety, tree/grass pollen   BPH (benign prostatic hyperplasia)    Bronchitis    hx of   Bulge of cervical disc without myelopathy    Chest pain    a. 06/2015 MV: EF 56%, no ischemia/infarct.   Complication of anesthesia 46/27/0350   no complications but just says he typically needs "more than expected" to anesthetize; needs CPAP (setting 10) in recovery   Depression    ADD   Insomnia    Low blood pressure    Mitral valve prolapse    a. 06/2015 Echo: EF 65-70%, mild LVH, no rwma, mild MVP of ant leaflet and mild to mod posteriorly directed MR, mildly dil La.   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   JOINT REPLACEMENT     Left, Right Knee, Right hip   KNEE ARTHROSCOPY     right x 3-4, left x 2   KNEE ARTHROTOMY  ~1991   right   LUMBAR FUSION  04/03/2013   [name], L5-S1   POLYPECTOMY     vocal cords 1992   REVERSE SHOULDER ARTHROPLASTY Right 09/27/2021   Procedure: REVERSE SHOULDER ARTHROPLASTY;  Surgeon: Netta Cedars, MD;  Location:  [facility];  Service: Orthopedics;  Laterality: Right;  with ISB   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   Procedure: LEFT TOTAL HIP ARTHROPLASTY ANTERIOR APPROACH;  Surgeon: [name], MD;  Location: [facility];  Service: Orthopedics;  Laterality: Left;   TOTAL HIP REVISION  04/22/2011   Procedure: TOTAL HIP REVISION;  Surgeon: Gearlean Alf, MD;  Location: [facility];  Service: Orthopedics;  Laterality: Right;   Patient Active Problem List   Diagnosis Date Noted   S/P shoulder replacement, right 09/27/2021   Pre-operative respiratory examination 11/13/2019   Ambulatory dysfunction 07/24/2019   Closed nondisplaced fracture of condyle of right femur ([place]) 07/24/2019   Bulge of cervical disc without myelopathy    Falls 07/23/2019   Cough 02/20/2019   Absolute anemia 02/04/2019   Iron deficiency anemia 02/04/2019   Abnormal findings on diagnostic imaging of lung 11/27/2018   ILD (interstitial lung disease) ([place]) 11/27/2018   Mild obesity 08/05/2017   Type 2 diabetes mellitus treated without insulin ([place]) 08/05/2017   MVP (mitral valve prolapse) 08/05/2017  Orthostatic hypotension 01/18/2017   Upper airway cough syndrome 11/23/2016   Cough variant asthma 11/23/2016   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 11/12/2015   Left ventricular hypertrophy 10/03/2015   Mixed hyperlipidemia 10/03/2015   PVCs (premature ventricular contractions) 07/13/2015   Obesity (BMI 30.0-34.9) 07/13/2015   Chest pain 06/29/2015   Uncontrolled diabetes mellitus without complication, without long-term current use of insulin 06/29/2015   Depression 06/29/2015   BPH (benign prostatic hyperplasia) 06/29/2015   OSA on CPAP 12/26/2006   REFERRING PROVIDER: Netta Cedars, MD  REFERRING DIAG: Encounter for other orthopedic aftercare  THERAPY DIAG:  Acute pain of right shoulder  Stiffness of right shoulder, not elsewhere classified  Rationale for Evaluation and Treatment Rehabilitation  ONSET DATE:  09/27/21  SUBJECTIVE:                                                                                                                                                                                      SUBJECTIVE STATEMENT: Patient reports that he used ice after his last appointment which really helped. He notes that linear movements feel ok, but circular movements cause his pain to spike.   PERTINENT HISTORY: Orthostatic hypotension, DM, depression, history of falling  PAIN:  Are you having pain? Yes: NPRS scale: 7/10 Pain location: posterior right shoulder Pain description: throbbing Aggravating factors: reaching behind him  Relieving factors: getting out of the painful position  PRECAUTIONS: Fall  WEIGHT BEARING RESTRICTIONS No   OBJECTIVE: all objective measures were performed at her initial evaluation on 11/17/21 unless otherwise noted  PATIENT SURVEYS:  FOTO 61.67  UPPER EXTREMITY ROM:   Active ROM Right eval Left eval  Shoulder flexion 124 135  Shoulder extension    Shoulder abduction 110 125  Shoulder adduction    Shoulder internal rotation To PSIS; limited by pain and stretch To inferior border of right scapula  Shoulder external rotation To T2 To T1   Elbow flexion    Elbow extension    Wrist flexion    Wrist extension    Wrist ulnar deviation    Wrist radial deviation    Wrist pronation    Wrist supination    (Blank rows = not tested)  UPPER EXTREMITY MMT:  MMT Right eval Left eval  Shoulder flexion 4-/5 3+/5  Shoulder extension    Shoulder abduction    Shoulder adduction    Shoulder internal rotation 4/5 4+/5  Shoulder external rotation 3+/5 with familiar pain 4-/5  Middle trapezius    Lower trapezius    Elbow flexion 4+/5 4+/5  Elbow extension 4/5 4+/5  Wrist flexion    Wrist extension    Wrist  ulnar deviation    Wrist radial deviation    Wrist pronation    Wrist supination    Grip strength (lbs)    (Blank rows = not tested)   TODAY'S  TREATMENT:                                    9/14 EXERCISE LOG  Exercise Repetitions and Resistance Comments  Pulleys  5 minutes   UE Ranger  3 minutes   Resisted row Green t-band; 2 minutes   Resisted ER  Yellow t-band; 2 minutes   Resisted IR Red t-band; 2 minutes   Resisted shoulder extension Red t-band; 2 minutes   Wall ladder  3 minutes; 30 max   Resisted shoulder ABD  Red t-band; 2.5 minutes   Bicep curl 4# x 30 reps    Blank cell = exercise not performed today  Modalities  Date:  Vaso: Shoulder, 34 degrees; low pressure, 10 mins, Pain                                   9/12 EXERCISE LOG  Exercise Repetitions and Resistance Comments  Pulleys  5 minutes    Wall ladder 3 minutes; 34-35 max w/ 5 second hold   Resisted shoulder extension Red t-band; 20 reps RUE only  Resisted shoulder ER Yellow x15 reps   Resisted shoulder horizontal abduct Yellow x15 reps   Wall wash X5 reps Stopped due to fatigue  Bicep curl 1# upper cut x20 reps   Short lever abduction 1# x20 reps    Blank cell = exercise not performed today    Date: 11/23/2021 Vaso: Shoulder, 34 degrees; low pressure, 15 mins, Pain  PATIENT EDUCATION: Education details: heat vs ice, TENS, POC, healing, prognosis, HEP Person educated: Patient Education method: Explanation Education comprehension: verbalized understanding   HOME EXERCISE PROGRAM: HJZWFLLF  ASSESSMENT:  CLINICAL IMPRESSION: Patient was progressed with multiple new and familiar interventions with moderate difficulty. He required moderate cueing with today's interventions for proper pacing and muscular control as he attempted to rush through today's interventions. He reported no significant pain or discomfort with today's interventions as fatigue was his primary limitation. He reported that his shoulder felt better upon the conclusion of treatment. He continues to require skilled physical therapy to address his remaining impairments to return to his  prior level of function.   OBJECTIVE IMPAIRMENTS decreased activity tolerance, decreased ROM, decreased strength, impaired UE functional use, and pain.   ACTIVITY LIMITATIONS lifting, dressing, and reach over head  PARTICIPATION LIMITATIONS: community activity  PERSONAL FACTORS 3+ comorbidities: Orthostatic hypotension, DM, depression, history of falling  are also affecting patient's functional outcome.   REHAB POTENTIAL: Good  CLINICAL DECISION MAKING: Stable/uncomplicated  EVALUATION COMPLEXITY: Low   GOALS: Goals reviewed with patient? No  LONG TERM GOALS: Target date:12/15/2021   Patient will be independent with his HEP.  Baseline:  Goal status: INITIAL  2.  Patient will be able to demonstrate at least 120 degrees of active right shoulder abduction for improved function reaching overhead.  Baseline:  Goal status: INITIAL  3.  Patient will be able to demonstrate sufficient right shoulder functional internal rotation to tuck the back of his shirt into his pants for improved function getting dressed.  Baseline:  Goal status: INITIAL  4.  Patient will be able to complete  his daily activities without his familiar pain exceeding 5/10. Baseline:  Goal status: INITIAL  PLAN: PT FREQUENCY: 2x/week  PT DURATION: 4 weeks  PLANNED INTERVENTIONS: Therapeutic exercises, Therapeutic activity, Neuromuscular re-education, Patient/Family education, Self Care, Joint mobilization, Electrical stimulation, Cryotherapy, Moist heat, Taping, Vasopneumatic device, Manual therapy, and Re-evaluation  PLAN FOR NEXT SESSION: pulleys, resisted IR/ER/extension, and modalities as needed   Darlin Coco, PT 11/25/2021, 9:46 AM

## 2021-11-30 ENCOUNTER — Encounter: Payer: Self-pay | Admitting: Physical Therapy

## 2021-11-30 ENCOUNTER — Ambulatory Visit: Payer: Medicare Other | Admitting: Physical Therapy

## 2021-11-30 DIAGNOSIS — M25611 Stiffness of right shoulder, not elsewhere classified: Secondary | ICD-10-CM

## 2021-11-30 DIAGNOSIS — M25511 Pain in right shoulder: Secondary | ICD-10-CM | POA: Diagnosis not present

## 2021-11-30 DIAGNOSIS — M6281 Muscle weakness (generalized): Secondary | ICD-10-CM | POA: Diagnosis not present

## 2021-11-30 NOTE — Therapy (Signed)
OUTPATIENT PHYSICAL THERAPY SHOULDER TREATMENT   Patient Name: [name]. MRN: [id number] DOB:[birth year], 76 y.o., male Today's Date: 11/30/2021   PT End of Session - 11/30/21 0904     Visit Number 5    Number of Visits 8    Date for PT Re-Evaluation 12/17/21    PT Start Time 0900    PT Stop Time 0949    PT Time Calculation (min) 49 min    Activity Tolerance Patient tolerated treatment well    Behavior During Therapy [facility] for tasks assessed/performed               Past Medical History:  Diagnosis Date   Arthritis    Asthma    anxiety, tree/grass pollen   BPH (benign prostatic hyperplasia)    Bronchitis    hx of   Bulge of cervical disc without myelopathy    Chest pain    a. 06/2015 MV: EF 56%, no ischemia/infarct.   Complication of anesthesia 84/13/2440   no complications but just says he typically needs "more than expected" to anesthetize; needs CPAP (setting 10) in recovery   Depression    ADD   Insomnia    Low blood pressure    Mitral valve prolapse    a. 06/2015 Echo: EF 65-70%, mild LVH, no rwma, mild MVP of ant leaflet and mild to mod posteriorly directed MR, mildly dil La.   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   JOINT REPLACEMENT     Left, Right Knee, Right hip   KNEE ARTHROSCOPY     right x 3-4, left x 2   KNEE ARTHROTOMY  ~1991   right   LUMBAR FUSION  04/03/2013   [name], L5-S1   POLYPECTOMY     vocal cords 1992   REVERSE SHOULDER ARTHROPLASTY Right 09/27/2021   Procedure: REVERSE SHOULDER ARTHROPLASTY;  Surgeon: Netta Cedars, MD;  Location:  [facility];  Service: Orthopedics;  Laterality: Right;  with ISB   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   Procedure: LEFT TOTAL HIP ARTHROPLASTY ANTERIOR APPROACH;  Surgeon: [name], MD;  Location: [facility];  Service: Orthopedics;  Laterality: Left;   TOTAL HIP REVISION  04/22/2011   Procedure: TOTAL HIP REVISION;  Surgeon: Gearlean Alf, MD;  Location: [facility];  Service: Orthopedics;  Laterality: Right;   Patient Active Problem List   Diagnosis Date Noted   S/P shoulder replacement, right 09/27/2021   Pre-operative respiratory examination 11/13/2019   Ambulatory dysfunction 07/24/2019   Closed nondisplaced fracture of condyle of right femur ([place]) 07/24/2019   Bulge of cervical disc without myelopathy    Falls 07/23/2019   Cough 02/20/2019   Absolute anemia 02/04/2019   Iron deficiency anemia 02/04/2019   Abnormal findings on diagnostic imaging of lung 11/27/2018   ILD (interstitial lung disease) ([place]) 11/27/2018   Mild obesity 08/05/2017   Type 2 diabetes mellitus treated without insulin ([place]) 08/05/2017   MVP (mitral valve prolapse) 08/05/2017  Orthostatic hypotension 01/18/2017   Upper airway cough syndrome 11/23/2016   Cough variant asthma 11/23/2016   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 11/12/2015   Left ventricular hypertrophy 10/03/2015   Mixed hyperlipidemia 10/03/2015   PVCs (premature ventricular contractions) 07/13/2015   Obesity (BMI 30.0-34.9) 07/13/2015   Chest pain 06/29/2015   Uncontrolled diabetes mellitus without complication, without long-term current use of insulin 06/29/2015   Depression 06/29/2015   BPH (benign prostatic hyperplasia) 06/29/2015   OSA on CPAP 12/26/2006   REFERRING PROVIDER: Netta Cedars, MD  REFERRING DIAG: Encounter for other orthopedic aftercare  THERAPY DIAG:  Acute pain of right shoulder  Stiffness of right shoulder, not elsewhere classified  Rationale for Evaluation and Treatment Rehabilitation  ONSET DATE:  09/27/21  SUBJECTIVE:                                                                                                                                                                                      SUBJECTIVE STATEMENT: Reports more pain in bone area of shoulder for the last several days since PT.   PERTINENT HISTORY: Orthostatic hypotension, DM, depression, history of falling  PAIN:  Are you having pain? Yes: NPRS scale: 4/10 Pain location: posterior right shoulder Pain description: sore, piercing pain with motion Aggravating factors: reaching behind him  Relieving factors: getting out of the painful position  PRECAUTIONS: Fall  WEIGHT BEARING RESTRICTIONS No   OBJECTIVE: all objective measures were performed at her initial evaluation on 11/17/21 unless otherwise noted  PATIENT SURVEYS:  FOTO 61.67  UPPER EXTREMITY ROM:   Active ROM Right eval Left eval  Shoulder flexion 124 135  Shoulder extension    Shoulder abduction 110 125  Shoulder adduction    Shoulder internal rotation To PSIS; limited by pain and stretch To inferior border of right scapula  Shoulder external rotation To T2 To T1   Elbow flexion    Elbow extension    Wrist flexion    Wrist extension    Wrist ulnar deviation    Wrist radial deviation    Wrist pronation    Wrist supination    (Blank rows = not tested)  UPPER EXTREMITY MMT:  MMT Right eval Left eval  Shoulder flexion 4-/5 3+/5  Shoulder extension    Shoulder abduction    Shoulder adduction    Shoulder internal rotation 4/5 4+/5  Shoulder external rotation 3+/5 with familiar pain 4-/5  Middle trapezius    Lower trapezius    Elbow flexion 4+/5 4+/5  Elbow extension 4/5 4+/5  Wrist flexion    Wrist extension    Wrist ulnar deviation    Wrist radial deviation  Wrist pronation    Wrist supination    Grip strength (lbs)    (Blank rows = not tested)   TODAY'S TREATMENT:                                    9/19 EXERCISE  LOG  Exercise Repetitions and Resistance Comments  Pulleys  5 minutes   UE Ranger  3 minutes   Resisted ER  Yellow t-band; 2 minutes   Resisted IR Red t-band; 2 minutes   Resisted shoulder extension Green t-band; 2 minutes   Wall ladder  4 minutes; 30 max   Resisted protraction Yellow x2 min    Blank cell = exercise not performed today  Modalities  Date: 9/19 Vaso: Shoulder, 34 degrees; low pressure, 15 mins, Pain  PATIENT EDUCATION: Education details: heat vs ice, TENS, POC, healing, prognosis, HEP Person educated: Patient Education method: Explanation Education comprehension: verbalized understanding   HOME EXERCISE PROGRAM: [email or web address]  ASSESSMENT:  CLINICAL IMPRESSION: Patient presented in clinic with reports of more "bone related" shoulder pain today. Patient guided through resisted shoulder strengthening with moderate multimodal cueing for technique corrections. Patient much weaker with shoulder ER and fatiguing fairly quickly. Normal vasopneumatic response noted following removal of the modality.  OBJECTIVE IMPAIRMENTS decreased activity tolerance, decreased ROM, decreased strength, impaired UE functional use, and pain.   ACTIVITY LIMITATIONS lifting, dressing, and reach over head  PARTICIPATION LIMITATIONS: community activity  PERSONAL FACTORS 3+ comorbidities: Orthostatic hypotension, DM, depression, history of falling  are also affecting patient's functional outcome.   REHAB POTENTIAL: Good  CLINICAL DECISION MAKING: Stable/uncomplicated  EVALUATION COMPLEXITY: Low   GOALS: Goals reviewed with patient? No  LONG TERM GOALS: Target date:12/15/2021   Patient will be independent with his HEP.  Baseline:  Goal status: INITIAL  2.  Patient will be able to demonstrate at least 120 degrees of active right shoulder abduction for improved function reaching overhead.  Baseline:  Goal status: INITIAL  3.  Patient will be able to demonstrate sufficient right  shoulder functional internal rotation to tuck the back of his shirt into his pants for improved function getting dressed.  Baseline:  Goal status: INITIAL  4.  Patient will be able to complete his daily activities without his familiar pain exceeding 5/10. Baseline:  Goal status: INITIAL  PLAN: PT FREQUENCY: 2x/week  PT DURATION: 4 weeks  PLANNED INTERVENTIONS: Therapeutic exercises, Therapeutic activity, Neuromuscular re-education, Patient/Family education, Self Care, Joint mobilization, Electrical stimulation, Cryotherapy, Moist heat, Taping, Vasopneumatic device, Manual therapy, and Re-evaluation  PLAN FOR NEXT SESSION: pulleys, resisted IR/ER/extension, and modalities as needed   Standley Brooking, PTA 11/30/2021, 9:53 AM

## 2021-12-02 ENCOUNTER — Ambulatory Visit: Payer: Medicare Other | Admitting: Physical Therapy

## 2021-12-02 ENCOUNTER — Encounter: Payer: Self-pay | Admitting: Physical Therapy

## 2021-12-02 DIAGNOSIS — M6281 Muscle weakness (generalized): Secondary | ICD-10-CM | POA: Diagnosis not present

## 2021-12-02 DIAGNOSIS — M25511 Pain in right shoulder: Secondary | ICD-10-CM | POA: Diagnosis not present

## 2021-12-02 DIAGNOSIS — M25611 Stiffness of right shoulder, not elsewhere classified: Secondary | ICD-10-CM | POA: Diagnosis not present

## 2021-12-02 NOTE — Therapy (Signed)
OUTPATIENT PHYSICAL THERAPY SHOULDER TREATMENT   Patient Name: [name]. MRN: [id number] DOB:[birth year], 76 y.o., male Today's Date: 12/02/2021   PT End of Session - 12/02/21 0905     Visit Number 6    Number of Visits 8    Date for PT Re-Evaluation 12/17/21    PT Start Time 0901    PT Stop Time 0945    PT Time Calculation (min) 44 min    Activity Tolerance Patient tolerated treatment well    Behavior During Therapy [facility] for tasks assessed/performed                Past Medical History:  Diagnosis Date   Arthritis    Asthma    anxiety, tree/grass pollen   BPH (benign prostatic hyperplasia)    Bronchitis    hx of   Bulge of cervical disc without myelopathy    Chest pain    a. 06/2015 MV: EF 56%, no ischemia/infarct.   Complication of anesthesia 44/31/5400   no complications but just says he typically needs "more than expected" to anesthetize; needs CPAP (setting 10) in recovery   Depression    ADD   Insomnia    Low blood pressure    Mitral valve prolapse    a. 06/2015 Echo: EF 65-70%, mild LVH, no rwma, mild MVP of ant leaflet and mild to mod posteriorly directed MR, mildly dil La.   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   JOINT REPLACEMENT     Left, Right Knee, Right hip   KNEE ARTHROSCOPY     right x 3-4, left x 2   KNEE ARTHROTOMY  ~1991   right   LUMBAR FUSION  04/03/2013   [name], L5-S1   POLYPECTOMY     vocal cords 1992   REVERSE SHOULDER ARTHROPLASTY Right 09/27/2021   Procedure: REVERSE SHOULDER ARTHROPLASTY;  Surgeon: Netta Cedars, MD;   Location: [facility];  Service: Orthopedics;  Laterality: Right;  with ISB   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   Procedure: LEFT TOTAL HIP ARTHROPLASTY ANTERIOR APPROACH;  Surgeon: [name], MD;  Location: [facility];  Service: Orthopedics;  Laterality: Left;   TOTAL HIP REVISION  04/22/2011   Procedure: TOTAL HIP REVISION;  Surgeon: Gearlean Alf, MD;  Location: [facility];  Service: Orthopedics;  Laterality: Right;   Patient Active Problem List   Diagnosis Date Noted   S/P shoulder replacement, right 09/27/2021   Pre-operative respiratory examination 11/13/2019   Ambulatory dysfunction 07/24/2019   Closed nondisplaced fracture of condyle of right femur ([place]) 07/24/2019   Bulge of cervical disc without myelopathy    Falls 07/23/2019   Cough 02/20/2019   Absolute anemia 02/04/2019   Iron deficiency anemia 02/04/2019   Abnormal findings on diagnostic imaging of lung 11/27/2018   ILD (interstitial lung disease) ([place]) 11/27/2018   Mild obesity 08/05/2017   Type 2 diabetes mellitus treated without insulin ([place]) 08/05/2017   MVP (mitral valve prolapse) 08/05/2017  Orthostatic hypotension 01/18/2017   Upper airway cough syndrome 11/23/2016   Cough variant asthma 11/23/2016   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 11/12/2015   Left ventricular hypertrophy 10/03/2015   Mixed hyperlipidemia 10/03/2015   PVCs (premature ventricular contractions) 07/13/2015   Obesity (BMI 30.0-34.9) 07/13/2015   Chest pain 06/29/2015   Uncontrolled diabetes mellitus without complication, without long-term current use of insulin 06/29/2015   Depression 06/29/2015   BPH (benign prostatic hyperplasia) 06/29/2015   OSA on CPAP 12/26/2006   REFERRING PROVIDER: Netta Cedars, MD  REFERRING DIAG: Encounter for other orthopedic aftercare  THERAPY DIAG:  Acute pain of right shoulder  Stiffness of right shoulder, not elsewhere classified  Muscle weakness (generalized)  Rationale for Evaluation and Treatment  Rehabilitation  ONSET DATE: 09/27/21  SUBJECTIVE:                                                                                                                                                                                      SUBJECTIVE STATEMENT: Reports more pain in posterior cuff of R shoulder.   PERTINENT HISTORY: Orthostatic hypotension, DM, depression, history of falling  PAIN:  Are you having pain? Yes: NPRS scale: 6/10 Pain location: posterior right shoulder Pain description: sore, piercing pain with motion Aggravating factors: reaching behind him  Relieving factors: getting out of the painful position  PRECAUTIONS: Fall  WEIGHT BEARING RESTRICTIONS No   OBJECTIVE: all objective measures were performed at her initial evaluation on 11/17/21 unless otherwise noted  PATIENT SURVEYS:  FOTO 61.67  UPPER EXTREMITY ROM:   Active ROM Right eval Left eval  Shoulder flexion 124 135  Shoulder extension    Shoulder abduction 110 125  Shoulder adduction    Shoulder internal rotation To PSIS; limited by pain and stretch To inferior border of right scapula  Shoulder external rotation To T2 To T1   Elbow flexion    Elbow extension    Wrist flexion    Wrist extension    Wrist ulnar deviation    Wrist radial deviation    Wrist pronation    Wrist supination    (Blank rows = not tested)  UPPER EXTREMITY MMT:  MMT Right eval Left eval  Shoulder flexion 4-/5 3+/5  Shoulder extension    Shoulder abduction    Shoulder adduction    Shoulder internal rotation 4/5 4+/5  Shoulder external rotation 3+/5 with familiar pain 4-/5  Middle trapezius    Lower trapezius    Elbow flexion 4+/5 4+/5  Elbow extension 4/5 4+/5  Wrist flexion    Wrist extension    Wrist ulnar deviation    Wrist radial deviation  Wrist pronation    Wrist supination    Grip strength (lbs)    (Blank rows = not tested)   TODAY'S TREATMENT:                                    9/19 EXERCISE  LOG  Exercise Repetitions and Resistance Comments  Pulleys  5 minutes   Wall slides X20 reps   Resisted ER  Yellow t-band; 2 minutes   Resisted IR Red t-band; 2 minutes   Resisted shoulder extension Red t-band; 2 minutes   Upper cut 2# > AROM due to fatigue and lack of ROM x20 reps   Resisted protraction Red x2 min    Blank cell = exercise not performed today   Manual Therapy Soft Tissue Mobilization: R posterior cuff, STW and massage with ball to R posteriolateral cuff to reduce tone    Modalities  Date: 9/21 Vaso: Shoulder, 34 degrees; low pressure, 10 mins, Pain  PATIENT EDUCATION: Education details: heat vs ice, TENS, POC, healing, prognosis, HEP Person educated: Patient Education method: Explanation Education comprehension: verbalized understanding   HOME EXERCISE PROGRAM: [email or web address]  ASSESSMENT:  CLINICAL IMPRESSION: Patient presented in clinic with reports of higher level pain in R posterior cuff. Patient reported feeling limited with ROM into flexion via tightness of the posterior cuff. Patient cautioned throughout therex via VCs and protocol listing to avoid over stressing the tissues. Patient had palpable tightness along lateral cuff region along infraspinatus/teres minor tendonous region. Normal vasopneumatic response noted following removal of the modality.  OBJECTIVE IMPAIRMENTS decreased activity tolerance, decreased ROM, decreased strength, impaired UE functional use, and pain.   ACTIVITY LIMITATIONS lifting, dressing, and reach over head  PARTICIPATION LIMITATIONS: community activity  PERSONAL FACTORS 3+ comorbidities: Orthostatic hypotension, DM, depression, history of falling  are also affecting patient's functional outcome.   REHAB POTENTIAL: Good  CLINICAL DECISION MAKING: Stable/uncomplicated  EVALUATION COMPLEXITY: Low   GOALS: Goals reviewed with patient? No  LONG TERM GOALS: Target date:12/15/2021   Patient will be independent with his HEP.   Baseline:  Goal status: INITIAL  2.  Patient will be able to demonstrate at least 120 degrees of active right shoulder abduction for improved function reaching overhead.  Baseline:  Goal status: INITIAL  3.  Patient will be able to demonstrate sufficient right shoulder functional internal rotation to tuck the back of his shirt into his pants for improved function getting dressed.  Baseline:  Goal status: INITIAL  4.  Patient will be able to complete his daily activities without his familiar pain exceeding 5/10. Baseline:  Goal status: INITIAL  PLAN: PT FREQUENCY: 2x/week  PT DURATION: 4 weeks  PLANNED INTERVENTIONS: Therapeutic exercises, Therapeutic activity, Neuromuscular re-education, Patient/Family education, Self Care, Joint mobilization, Electrical stimulation, Cryotherapy, Moist heat, Taping, Vasopneumatic device, Manual therapy, and Re-evaluation  PLAN FOR NEXT SESSION: pulleys, resisted IR/ER/extension, and modalities as needed   Standley Brooking, PTA 12/02/2021, 11:18 AM

## 2021-12-07 ENCOUNTER — Ambulatory Visit: Payer: Medicare Other | Admitting: *Deleted

## 2021-12-07 ENCOUNTER — Encounter: Payer: Self-pay | Admitting: *Deleted

## 2021-12-07 DIAGNOSIS — M25611 Stiffness of right shoulder, not elsewhere classified: Secondary | ICD-10-CM

## 2021-12-07 DIAGNOSIS — M25511 Pain in right shoulder: Secondary | ICD-10-CM | POA: Diagnosis not present

## 2021-12-07 DIAGNOSIS — M6281 Muscle weakness (generalized): Secondary | ICD-10-CM | POA: Diagnosis not present

## 2021-12-07 NOTE — Therapy (Signed)
OUTPATIENT PHYSICAL THERAPY SHOULDER TREATMENT   Patient Name: [name]. MRN: [id number] DOB:09/10/1945, 76 y.o., male Today's Date: 12/07/2021   PT End of Session - 12/07/21 0905     Visit Number 7    Number of Visits 8    Date for PT Re-Evaluation 12/17/21    PT Start Time 0900    PT Stop Time 0951    PT Time Calculation (min) 51 min                Past Medical History:  Diagnosis Date   Arthritis    Asthma    anxiety, tree/grass pollen   BPH (benign prostatic hyperplasia)    Bronchitis    hx of   Bulge of cervical disc without myelopathy    Chest pain    a. 06/2015 MV: EF 56%, no ischemia/infarct.   Complication of anesthesia 03/22/3233   no complications but just says he typically needs "more than expected" to anesthetize; needs CPAP (setting 10) in recovery   Depression    ADD   Insomnia    Low blood pressure    Mitral valve prolapse    a. 06/2015 Echo: EF 65-70%, mild LVH, no rwma, mild MVP of ant leaflet and mild to mod posteriorly directed MR, mildly dil La.   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   JOINT REPLACEMENT     Left, Right Knee, Right hip   KNEE ARTHROSCOPY     right x 3-4, left x 2   KNEE ARTHROTOMY  ~1991   right   LUMBAR FUSION  04/03/2013   [name], L5-S1   POLYPECTOMY     vocal cords 1992   REVERSE SHOULDER ARTHROPLASTY Right 09/27/2021   Procedure: REVERSE SHOULDER ARTHROPLASTY;  Surgeon: Netta Cedars, MD;  Location: [facility];  Service: Orthopedics;  Laterality: Right;  with ISB   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   Procedure:  LEFT TOTAL HIP ARTHROPLASTY ANTERIOR APPROACH;  Surgeon: [name], MD;  Location: [facility];  Service: Orthopedics;  Laterality: Left;   TOTAL HIP REVISION  04/22/2011   Procedure: TOTAL HIP REVISION;  Surgeon: Gearlean Alf, MD;  Location: [facility];  Service: Orthopedics;  Laterality: Right;   Patient Active Problem List   Diagnosis Date Noted   S/P shoulder replacement, right 09/27/2021   Pre-operative respiratory examination 11/13/2019   Ambulatory dysfunction 07/24/2019   Closed nondisplaced fracture of condyle of right femur ([place]) 07/24/2019   Bulge of cervical disc without myelopathy    Falls 07/23/2019   Cough 02/20/2019   Absolute anemia 02/04/2019   Iron deficiency anemia 02/04/2019   Abnormal findings on diagnostic imaging of lung 11/27/2018   ILD (interstitial lung disease) ([place]) 11/27/2018   Mild obesity 08/05/2017   Type 2 diabetes mellitus treated without insulin ([place]) 08/05/2017   MVP (mitral valve prolapse) 08/05/2017   Orthostatic hypotension 01/18/2017   Upper airway cough syndrome 11/23/2016   Cough variant asthma 11/23/2016  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 11/12/2015   Left ventricular hypertrophy 10/03/2015   Mixed hyperlipidemia 10/03/2015   PVCs (premature ventricular contractions) 07/13/2015   Obesity (BMI 30.0-34.9) 07/13/2015   Chest pain 06/29/2015   Uncontrolled diabetes mellitus without complication, without long-term current use of insulin 06/29/2015   Depression 06/29/2015   BPH (benign prostatic hyperplasia) 06/29/2015   OSA on CPAP 12/26/2006   REFERRING PROVIDER: Netta Cedars, MD  REFERRING DIAG: Encounter for other orthopedic aftercare  THERAPY DIAG:  Stiffness of right shoulder, not elsewhere classified  Rationale for Evaluation and Treatment Rehabilitation  ONSET DATE: 09/27/21 RT shldr surgery R-TSR  SUBJECTIVE:                                                                                                                                                                                       SUBJECTIVE STATEMENT: Reports more pain in posterior cuff of R shoulder.   PERTINENT HISTORY: Orthostatic hypotension, DM, depression, history of falling  PAIN:  Are you having pain? Yes: NPRS scale: 6/10 Pain location: posterior right shoulder Pain description: sore, piercing pain with motion Aggravating factors: reaching behind him  Relieving factors: getting out of the painful position  PRECAUTIONS: Fall  WEIGHT BEARING RESTRICTIONS No   OBJECTIVE: all objective measures were performed at her initial evaluation on 11/17/21 unless otherwise noted  PATIENT SURVEYS:  FOTO 61.67  UPPER EXTREMITY ROM:   Active ROM Right eval Left eval  Shoulder flexion 124 135  Shoulder extension    Shoulder abduction 110 125  Shoulder adduction    Shoulder internal rotation To PSIS; limited by pain and stretch To inferior border of right scapula  Shoulder external rotation To T2 To T1   Elbow flexion    Elbow extension    Wrist flexion    Wrist extension    Wrist ulnar deviation    Wrist radial deviation    Wrist pronation    Wrist supination    (Blank rows = not tested)  UPPER EXTREMITY MMT:  MMT Right eval Left eval  Shoulder flexion 4-/5 3+/5  Shoulder extension    Shoulder abduction    Shoulder adduction    Shoulder internal rotation 4/5 4+/5  Shoulder external rotation 3+/5 with familiar pain 4-/5  Middle trapezius    Lower trapezius    Elbow flexion 4+/5 4+/5  Elbow extension 4/5 4+/5  Wrist flexion    Wrist extension    Wrist ulnar deviation    Wrist radial deviation    Wrist pronation    Wrist supination    Grip strength (lbs)    (Blank rows = not tested)   TODAY'S  TREATMENT:                                    9/26 EXERCISE LOG     RT shldr  Exercise Repetitions and Resistance Comments  Pulleys  5 minutes   Wall slides    Resisted ER     Resisted IR    Resisted shoulder extension RED x 10  hold 5 secs with cues for scap retraction   punches RED 2x10 slow and controlled   ABD RED2x10 slow and controlled   XTS  Green tube  high pulley 2x10 (seat belt pattern)   Upper cut 2# > AROM due to fatigue and lack of ROM x20 reps   Resisted protraction     Blank cell = exercise not performed today   Manual Therapy Soft Tissue Mobilization: R posterior cuff ,Rhomboids,UT and levator TPR, STW/ TPR with Pt sitting    Modalities  Date: 9/21 Vaso: Shoulder, 34 degrees; low pressure, 10 mins, Pain  PATIENT EDUCATION: Education details: heat vs ice, TENS, POC, healing, prognosis, HEP Person educated: Patient Education method: Explanation Education comprehension: verbalized understanding   HOME EXERCISE PROGRAM: [id number]  ASSESSMENT:  CLINICAL IMPRESSION: Pt arrived today doing fair  with RT shldr with pain 5/10. Rx focused on scaapular stabilization and deltoid strengthening. Pt reports unable to reach behind  his back very well and was advised not to push that motion due to contraindications. He also reports not being able to buckle his  seat belt yet. Pt did well with simulation exercise. OBJECTIVE IMPAIRMENTS decreased activity tolerance, decreased ROM, decreased strength, impaired UE functional use, and pain.   ACTIVITY LIMITATIONS lifting, dressing, and reach over head  PARTICIPATION LIMITATIONS: community activity  PERSONAL FACTORS 3+ comorbidities: Orthostatic hypotension, DM, depression, history of falling  are also affecting patient's functional outcome.   REHAB POTENTIAL: Good  CLINICAL DECISION MAKING: Stable/uncomplicated  EVALUATION COMPLEXITY: Low   GOALS: Goals reviewed with patient? No  LONG TERM GOALS: Target date:12/15/2021   Patient will be independent with his HEP.  Baseline:  Goal status: INITIAL  2.  Patient will be able to demonstrate at least 120 degrees of active right shoulder abduction for improved function reaching overhead.  Baseline:   Goal status: INITIAL  3.  Patient will be able to demonstrate sufficient right shoulder functional internal rotation to tuck the back of his shirt into his pants for improved function getting dressed.  Baseline:  Goal status: INITIAL  4.  Patient will be able to complete his daily activities without his familiar pain exceeding 5/10. Baseline:  Goal status: INITIAL  PLAN: PT FREQUENCY: 2x/week  PT DURATION: 4 weeks  PLANNED INTERVENTIONS: Therapeutic exercises, Therapeutic activity, Neuromuscular re-education, Patient/Family education, Self Care, Joint mobilization, Electrical stimulation, Cryotherapy, Moist heat, Taping, Vasopneumatic device, Manual therapy, and Re-evaluation  PLAN FOR NEXT SESSION: pulleys, resisted IR/ER/extension, and modalities as needed   Cranston Koors,CHRIS, PTA 12/07/2021, 10:02 AM

## 2021-12-08 DIAGNOSIS — Z23 Encounter for immunization: Secondary | ICD-10-CM | POA: Diagnosis not present

## 2021-12-09 ENCOUNTER — Encounter: Payer: Medicare Other | Admitting: Physical Therapy

## 2021-12-14 ENCOUNTER — Encounter: Payer: Medicare Other | Admitting: Physical Therapy

## 2021-12-15 ENCOUNTER — Ambulatory Visit: Payer: Medicare Other | Attending: Cardiovascular Disease | Admitting: Cardiovascular Disease

## 2021-12-15 ENCOUNTER — Encounter: Payer: Self-pay | Admitting: Cardiovascular Disease

## 2021-12-15 VITALS — BP 108/76 | HR 70 | Ht 74.0 in | Wt 217.6 lb

## 2021-12-15 DIAGNOSIS — G4733 Obstructive sleep apnea (adult) (pediatric): Secondary | ICD-10-CM | POA: Insufficient documentation

## 2021-12-15 DIAGNOSIS — Z8639 Personal history of other endocrine, nutritional and metabolic disease: Secondary | ICD-10-CM | POA: Insufficient documentation

## 2021-12-15 DIAGNOSIS — I700 Atherosclerosis of aorta: Secondary | ICD-10-CM | POA: Diagnosis not present

## 2021-12-15 DIAGNOSIS — I340 Nonrheumatic mitral (valve) insufficiency: Secondary | ICD-10-CM | POA: Insufficient documentation

## 2021-12-15 DIAGNOSIS — I2510 Atherosclerotic heart disease of native coronary artery without angina pectoris: Secondary | ICD-10-CM | POA: Diagnosis not present

## 2021-12-15 DIAGNOSIS — I493 Ventricular premature depolarization: Secondary | ICD-10-CM | POA: Insufficient documentation

## 2021-12-15 DIAGNOSIS — E663 Overweight: Secondary | ICD-10-CM | POA: Insufficient documentation

## 2021-12-15 DIAGNOSIS — J8410 Pulmonary fibrosis, unspecified: Secondary | ICD-10-CM | POA: Insufficient documentation

## 2021-12-15 DIAGNOSIS — E7800 Pure hypercholesterolemia, unspecified: Secondary | ICD-10-CM | POA: Diagnosis not present

## 2021-12-15 NOTE — Progress Notes (Signed)
Cardiology office note   Date:  12/15/2021   ID:  [name]., DOB 1945/04/15, MRN [id number]  PCP:  Lajean Manes, MD  Cardiologist:  Bianca Vester Electrophysiologist:  None   Evaluation Performed:  Follow-Up Visit  Chief Complaint: CAD, PVCs  History of Present Illness:    [name]. is a 75 y.o. male with known moderate CAD managed medically, mitral valve prolapse of the anterior leaflet with mild to moderate mitral insufficiency, diabetes mellitus, hyperlipidemia, symptomatic PVCs, pulmonary fibrosis, aortic atherosclerosis.  No longer on CPAP after weight loss.  Activities mostly limited by his orthopedic problems, especially back pain.  He has had bilateral hip and knee replacements and lumbar spine surgery.  He has managed to lose substantial weight (this was triggered when he took Effexor which caused marked anorexia, but he has managed to keep most of the weight off even after stopping the medication) and is now in overweight rather than obese range.  This has led to marked improvement in his sleep.  He wore a monitor that showed that his respiratory rate during sleep was around 16 respirations per minute without significant pauses and his oxygen saturation stayed around 95%.  He is no longer using CPAP.  He has a had any new syncopal or near syncopal events but continues to have some orthostatic dizziness, especially when he first gets out of bed in the morning.  He has occasional palpitations ("double beat") which are likely due to PVCs.  On physical exam and when wearing monitors he has many more PVCs than the ones that he acknowledges.  Multiple PVCs were seen on previous EKGs, although not today.  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  The patient specifically denies any chest pain at rest exertion, dyspnea at rest or with exertion, orthopnea, paroxysmal nocturnal dyspnea, syncope, focal neurological deficits,  intermittent claudication, lower extremity edema, unexplained weight gain, cough, hemoptysis or wheezing.  I do not have his most recent lipid profile, but his most recent hemoglobin A1c was borderline for diabetes mellitus again at 6.4%.  He thinks that his LDL is still in the 50s, per his recollection.  Cardiac catheterization in 2008 showed a 95% stenosis in a small third diagonal artery branch, with mild plaque elsewhere.  He had a low risk nuclear stress test in April 2017.  Echo in 2017 showed LVEF 65-70% with mild mitral regurgitation.  He does not have congestive heart failure.    Past Medical History:  Diagnosis Date   Arthritis    Asthma    anxiety, tree/grass pollen   BPH (benign prostatic hyperplasia)    Bronchitis    hx of   Bulge of cervical disc without myelopathy    Chest pain    a. 06/2015 MV: EF 56%, no ischemia/infarct.   Complication of anesthesia 09/04/7626   no complications but just says he typically needs "more than expected" to anesthetize; needs CPAP (setting 10) in recovery   Depression    ADD   Insomnia    Low blood pressure    Mitral valve prolapse    a. 06/2015 Echo: EF 65-70%, mild LVH, no rwma, mild MVP of ant leaflet and mild to mod posteriorly directed MR, mildly dil La.   Obesity    Pre-diabetes    PVC's (premature ventricular contractions)    Recurrent upper respiratory infection (URI)    states Dr Wynelle Link aware- no fever- states is improving  Sleep apnea    no longer uses cpap due to weight loss   Tremor    d/t lithium   Past Surgical History:  Procedure Laterality Date   CARDIAC CATHETERIZATION     2008 pre op gastric bypass   COLONOSCOPY W/ POLYPECTOMY     GASTRIC BYPASS  2008   Roux en y   JOINT REPLACEMENT     Left, Right Knee, Right hip   KNEE ARTHROSCOPY     right x 3-4, left x 2   KNEE ARTHROTOMY  ~1991   right   LUMBAR FUSION  04/03/2013   [name], L5-S1   POLYPECTOMY     vocal cords 1992   REVERSE SHOULDER ARTHROPLASTY  Right 09/27/2021   Procedure: REVERSE SHOULDER ARTHROPLASTY;  Surgeon: Netta Cedars, MD;  Location: [facility];  Service: Orthopedics;  Laterality: Right;  with ISB   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   Procedure: LEFT TOTAL HIP ARTHROPLASTY ANTERIOR APPROACH;  Surgeon: [name], MD;  Location: [facility];  Service: Orthopedics;  Laterality: Left;   TOTAL HIP REVISION  04/22/2011   Procedure: TOTAL HIP REVISION;  Surgeon: Gearlean Alf, MD;  Location: [facility];  Service: Orthopedics;  Laterality: Right;     Current Meds  Medication Sig   acetaminophen (TYLENOL) 325 MG tablet Take 650 mg by mouth every 6 (six) hours as needed.   alfuzosin (UROXATRAL) 10 MG 24 hr tablet Take 10 mg by mouth daily.   aspirin EC 81 MG tablet Take 81 mg by mouth daily.   finasteride (PROSCAR) 5 MG tablet Take 5 mg by mouth daily.   HYDROcodone-acetaminophen (NORCO) 10-325 MG per tablet Take 1-2 tablets by mouth every 6 (six) hours as needed for moderate pain. (Patient taking differently: Take 1 tablet by mouth every 4 (four) hours as needed for severe pain.)   LORazepam (ATIVAN) 0.5 MG tablet Take 0.5 mg by mouth in the morning and at bedtime.   metFORMIN (GLUCOPHAGE) 500 MG tablet Take 500 mg by mouth 2 (two) times daily with a meal.    methocarbamol (ROBAXIN) 750 MG tablet Take 750 mg by mouth every 6 (six) hours as needed for muscle spasms.   Multiple Vitamins-Minerals (CENTRUM SILVER ADULT 50+ PO) Take 1 tablet by mouth daily.   pravastatin (PRAVACHOL) 40 MG tablet Take 40 mg by mouth daily.   venlafaxine XR (EFFEXOR-XR) 75 MG 24 hr capsule Take 150 mg by mouth every morning.     Allergies:   Breo ellipta [fluticasone furoate-vilanterol], Demerol [meperidine], Lamotrigine, and Nsaids   Social History   Tobacco Use   Smoking status: Former    Packs/day: 1.50    Years: 20.00    Total pack years: 30.00    Types: Cigarettes    Quit date: 04/17/1990    Years since quitting: 31.6   Smokeless tobacco: Never   Vaping Use   Vaping Use: Never used  Substance Use Topics   Alcohol use: No   Drug use: No     Family Hx: The patient's family history includes Anxiety disorder in his mother; Asthma in his maternal grandmother; Heart disease in his father.  ROS:   Please see the history of present illness.    All other systems are reviewed and are negative.   Prior CV studies:   The following studies were reviewed today:  Echocardiogram 2017 - Left ventricle: The cavity size was normal. Wall thickness was    increased in a pattern of mild LVH. Systolic  function was    vigorous. The estimated ejection fraction was in the range of 65%    to 70%. Wall motion was normal; there were no regional wall    motion abnormalities. The study is not technically sufficient to    allow evaluation of LV diastolic function.  - Mitral valve: There was mild regurgitation.  - Left atrium: The atrium was mildly dilated.   High resolution chest CT November 05, 2019 1. 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 Stable mild patchy air trapping in both lungs. 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 Fibrotic NSIP is on the differential. Findings are suggestive of an alternative diagnosis (not UIP) per consensus guidelines: Diagnosis of Idiopathic Pulmonary Fibrosis: An Official ATS/ERS/JRS/ALAT Clinical Practice Guideline. [facility], Iss 5, [phone], Nov 12 2016. 2. Three-vessel coronary atherosclerosis. 3. Small pericardial effusion/thickening, slightly increased. 4. Aortic Atherosclerosis (ICD10-I70.0) and Emphysema (ICD10-J43.9).  Labs/Other Tests and Data Reviewed:    EKG: Ordered today and personally reviewed shows normal sinus rhythm with old RBBB.  No PVCs are seen.  There are no repolarization  abnormalities.  Recent Labs:  BMET    Component Value Date/Time   NA 139 09/28/2021 0400   K 5.4 (H) 09/28/2021 0400   CL 106 09/28/2021 0400   CO2 27 09/28/2021 0400   GLUCOSE 190 (H) 09/28/2021 0400   BUN 19 09/28/2021 0400   CREATININE 0.82 09/28/2021 0400   CREATININE 0.72 05/19/2020 1129   CALCIUM 9.0 09/28/2021 0400   GFRNONAA >60 09/28/2021 0400   GFRNONAA >60 05/19/2020 1129   GFRAA >60 07/25/2019 0440   GFRAA >60 02/04/2019 0854    LABS from 09/06/2018  Creatinine 0.86, hemoglobin A1c 6.4%, glucose 108, hemoglobin 11.9, potassium 4.7, ferritin 12, normal liver function tests  LABS from 02/02/2018 Total cholesterol 141, HDL 43, LDL 73, triglycerides 125  Labs from 09/25/2019 Hemoglobin A1c 5.6%, hemoglobin 11.5, creatinine 0.99, potassium 4.7, normal liver function tests  Labs from 09/28/2021 Hemoglobin A1c 6.4%, hemoglobin 10.8, creatinine 0.82, potassium 5.4  Recent Lipid Panel Lab Results  Component Value Date/Time   CHOL 218 (H) 06/29/2015 07:29 PM   TRIG 173 (H) 06/29/2015 07:29 PM   HDL 42 06/29/2015 07:29 PM   CHOLHDL 5.2 06/29/2015 07:29 PM   LDLCALC 141 (H) 06/29/2015 07:29 PM   Labs from 01/18/2019 Total cholesterol 126, HDL 54, LDL 53, triglycerides 92  Wt Readings from Last 3 Encounters:  12/15/21 217 lb 9.6 oz (98.7 kg)  09/27/21 220 lb (99.8 kg)  09/22/21 220 lb (99.8 kg)     Objective:    Vital Signs:  BP 108/76 (BP Location: Left Arm, Patient Position: Sitting, Cuff Size: Normal)   Pulse 70   Ht '6\' 2"'$  (1.88 m)   Wt 217 lb 9.6 oz (98.7 kg)   SpO2 96%   BMI 27.94 kg/m     General: Alert, oriented x3, no distress, overweight Head: no evidence of trauma, PERRL, EOMI, no exophtalmos or lid lag, no myxedema, no xanthelasma; normal ears, nose and oropharynx Neck: normal jugular venous pulsations and no hepatojugular reflux; brisk carotid pulses without delay and no carotid bruits Chest: clear to auscultation, no signs of consolidation  by percussion or palpation, normal fremitus, symmetrical and full respiratory excursions Cardiovascular: normal position and quality of the apical impulse, regular rhythm, normal first and second heart sounds, apical midsystolic  click and late systolic murmur radiating towards the left shoulder, no diastolic murmurs, rubs or gallops Abdomen: no tenderness or distention, no masses by palpation, no abnormal pulsatility or arterial bruits, normal bowel sounds, no hepatosplenomegaly Extremities: no clubbing, cyanosis or edema; 2+ radial, ulnar and brachial pulses bilaterally; 2+ right femoral, posterior tibial and dorsalis pedis pulses; 2+ left femoral, posterior tibial and dorsalis pedis pulses; no subclavian or femoral bruits Neurological: grossly nonfocal Psych: Normal mood and affect   ASSESSMENT & PLAN:    1. Coronary artery disease involving native coronary artery of native heart without angina pectoris         CAD: Asymptomatic at rest or with usual activity.  Unfortunately he has gained back some more weight and in particular his glycemic control has deteriorated.  Do not have his most recent lipid profile but he is still taking pravastatin.  Previous cardiac catheterization showed plaque in all major coronary arteries, but he only had a significant stenosis in a very small and distal diagonal branch.  Sedentary lifestyle is the biggest issue, mostly related to his back pain Aortic atherosclerosis: Noted on imaging studies.  Normal caliber aorta with no symptoms of PAD.  Pulmonary fibrosis: Substantial changes as seen on chest CT, but these appear to be stationary.   Note that in 2018 he had pulmonary spirometry which showed FVC of 97% of predicted and FEV1 greater than 100% of predicted.   MVP/MR: Unchanged murmur.  Mild mitral insufficiency on most recent echocardiogram.  Recheck echo if he develops worsening functional status/exertional dyspnea. PVCs: These have been present for many many  years.  Her exam today, but not captured on ECG.  Minimally symptomatic and did not require particular treatment. OSA: After a rate loss he feels that he no longer has sleep apnea and does not need CPAP.  Indeed he denies any daytime hypersomnolence and a previous monitor showed maintenance of normal oxygen saturation levels throughout sleep.  Cautioned him that if he gains weight back sleep apnea may recur. (Pre)DM type 2: Hemoglobin A1c is acceptable, but has increased since last year.  Advised him to avoid carbohydrate rich meals.  Try to lose some weight. HLP: Followed by PCP.  On pravastatin.  Target LDL less than 70. Overweight: At 1 point he was fully in obesity range.  He dropped down to 200 pounds when he was taking Effexor which markedly suppressed his appetite.  Has gained back some of that weight.    Medication Adjustments/Labs and Tests Ordered: Current medicines are reviewed at length with the patient today.  Concerns regarding medicines are outlined above.   Tests Ordered: Orders Placed This Encounter  Procedures   EKG 12-Lead     Medication Changes: No orders of the defined types were placed in this encounter.  Patient Instructions  Medication Instructions:  Your Physician recommend you continue on your current medication as directed.    *If you need a refill on your cardiac medications before your next appointment, please call your pharmacy*   Lab Work: None ordered today    Testing/Procedures: None ordered today   Follow-Up: At [facility], you and your health needs are our priority.  As part of our continuing mission to provide you with exceptional heart care, we have created designated Provider Care Teams.  These Care Teams include your primary Cardiologist (physician) and Advanced Practice Providers (APPs -  Physician Assistants and Nurse Practitioners) who all work together to provide you with the care you need, when  you need it.  We recommend  signing up for the patient portal called "MyChart".  Sign up information is provided on this After Visit Summary.  MyChart is used to connect with patients for Virtual Visits (Telemedicine).  Patients are able to view lab/test results, encounter notes, upcoming appointments, etc.  Non-urgent messages can be sent to your provider as well.   To learn more about what you can do with MyChart, go to [email or web address].    Your next appointment:   1 year(s)  The format for your next appointment:   In Person  Provider:   Sanda Klein, MD             Signed, Sanda Klein, MD  12/15/2021 1:16 PM    [place]

## 2021-12-15 NOTE — Patient Instructions (Signed)
Medication Instructions:  Your Physician recommend you continue on your current medication as directed.    *If you need a refill on your cardiac medications before your next appointment, please call your pharmacy*   Lab Work: None ordered today    Testing/Procedures: None ordered today   Follow-Up: At [facility], you and your health needs are our priority.  As part of our continuing mission to provide you with exceptional heart care, we have created designated Provider Care Teams.  These Care Teams include your primary Cardiologist (physician) and Advanced Practice Providers (APPs -  Physician Assistants and Nurse Practitioners) who all work together to provide you with the care you need, when you need it.  We recommend signing up for the patient portal called "MyChart".  Sign up information is provided on this After Visit Summary.  MyChart is used to connect with patients for Virtual Visits (Telemedicine).  Patients are able to view lab/test results, encounter notes, upcoming appointments, etc.  Non-urgent messages can be sent to your provider as well.   To learn more about what you can do with MyChart, go to [email or web address].    Your next appointment:   1 year(s)  The format for your next appointment:   In Person  Provider:   Sanda Klein, MD

## 2021-12-16 ENCOUNTER — Encounter: Payer: Self-pay | Admitting: Physical Therapy

## 2021-12-16 ENCOUNTER — Ambulatory Visit: Payer: Medicare Other | Attending: Orthopedic Surgery | Admitting: Physical Therapy

## 2021-12-16 DIAGNOSIS — M6281 Muscle weakness (generalized): Secondary | ICD-10-CM | POA: Diagnosis not present

## 2021-12-16 DIAGNOSIS — M25511 Pain in right shoulder: Secondary | ICD-10-CM | POA: Diagnosis not present

## 2021-12-16 DIAGNOSIS — M25611 Stiffness of right shoulder, not elsewhere classified: Secondary | ICD-10-CM | POA: Diagnosis not present

## 2021-12-16 NOTE — Therapy (Addendum)
OUTPATIENT PHYSICAL THERAPY SHOULDER TREATMENT   Patient Name: [name]. MRN: [id number] DOB:Oct 28, 1945, 76 y.o., male Today's Date: 12/16/2021   PT End of Session - 12/16/21 0959     Visit Number 8    Number of Visits 8    Date for PT Re-Evaluation 12/17/21    PT Start Time 0901    PT Stop Time 0946    PT Time Calculation (min) 45 min    Activity Tolerance Patient tolerated treatment well    Behavior During Therapy [facility] for tasks assessed/performed            Past Medical History:  Diagnosis Date   Arthritis    Asthma    anxiety, tree/grass pollen   BPH (benign prostatic hyperplasia)    Bronchitis    hx of   Bulge of cervical disc without myelopathy    Chest pain    a. 06/2015 MV: EF 56%, no ischemia/infarct.   Complication of anesthesia 01/60/1093   no complications but just says he typically needs "more than expected" to anesthetize; needs CPAP (setting 10) in recovery   Depression    ADD   Insomnia    Low blood pressure    Mitral valve prolapse    a. 06/2015 Echo: EF 65-70%, mild LVH, no rwma, mild MVP of ant leaflet and mild to mod posteriorly directed MR, mildly dil La.   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   JOINT REPLACEMENT     Left, Right Knee, Right hip   KNEE ARTHROSCOPY     right x 3-4, left x 2   KNEE ARTHROTOMY  ~1991   right   LUMBAR FUSION  04/03/2013   [name], L5-S1   POLYPECTOMY     vocal cords 1992   REVERSE SHOULDER ARTHROPLASTY Right 09/27/2021   Procedure: REVERSE SHOULDER ARTHROPLASTY;  Surgeon: Netta Cedars, MD;  Location: [facility]  ORS;  Service: Orthopedics;  Laterality: Right;  with ISB   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   Procedure: LEFT TOTAL HIP ARTHROPLASTY ANTERIOR APPROACH;  Surgeon: [name], MD;  Location: [facility] ORS;  Service: Orthopedics;  Laterality: Left;   TOTAL HIP REVISION  04/22/2011   Procedure: TOTAL HIP REVISION;  Surgeon: Gearlean Alf, MD;  Location: [facility] ORS;  Service: Orthopedics;  Laterality: Right;   Patient Active Problem List   Diagnosis Date Noted   S/P shoulder replacement, right 09/27/2021   Pre-operative respiratory examination 11/13/2019   Ambulatory dysfunction 07/24/2019   Closed nondisplaced fracture of condyle of right femur ([place]) 07/24/2019   Bulge of cervical disc without myelopathy    Falls 07/23/2019   Cough 02/20/2019   Absolute anemia 02/04/2019   Iron deficiency anemia 02/04/2019   Abnormal findings on diagnostic imaging of lung 11/27/2018   ILD (interstitial lung disease) ([place]) 11/27/2018   Mild obesity 08/05/2017   Type 2 diabetes mellitus treated without insulin ([place]) 08/05/2017   MVP (mitral valve prolapse) 08/05/2017   Orthostatic hypotension 01/18/2017  Upper airway cough syndrome 11/23/2016   Cough variant asthma 11/23/2016   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 11/12/2015   Left ventricular hypertrophy 10/03/2015   Mixed hyperlipidemia 10/03/2015   PVCs (premature ventricular contractions) 07/13/2015   Obesity (BMI 30.0-34.9) 07/13/2015   Chest pain 06/29/2015   Uncontrolled diabetes mellitus without complication, without long-term current use of insulin 06/29/2015   Depression 06/29/2015   BPH (benign prostatic hyperplasia) 06/29/2015   OSA on CPAP 12/26/2006   REFERRING PROVIDER: Netta Cedars, MD  REFERRING DIAG: Encounter for other orthopedic aftercare  THERAPY DIAG:  Stiffness of right shoulder, not elsewhere classified  Acute pain of right shoulder  Rationale for Evaluation and Treatment Rehabilitation  ONSET DATE: 09/27/21 RT shldr  surgery R-TSR  SUBJECTIVE:                                                                                                                                                                                      SUBJECTIVE STATEMENT: Reports more pain in posterior cuff of R shoulder. Was out of PT as he had constipation issues.   PERTINENT HISTORY: Orthostatic hypotension, DM, depression, history of falling  PAIN:  Are you having pain? Yes: NPRS scale: 7-8/10 Pain location: posterior right shoulder Pain description: sore, piercing pain with motion Aggravating factors: reaching behind him  Relieving factors: getting out of the painful position  PRECAUTIONS: Fall  WEIGHT BEARING RESTRICTIONS No   OBJECTIVE: all objective measures were performed at her initial evaluation on 11/17/21 unless otherwise noted  PATIENT SURVEYS:  FOTO 61.67  UPPER EXTREMITY ROM:   Active ROM Right eval Left eval  Shoulder flexion 124 135  Shoulder extension    Shoulder abduction 110 125  Shoulder adduction    Shoulder internal rotation To PSIS; limited by pain and stretch To inferior border of right scapula  Shoulder external rotation To T2 To T1   Elbow flexion    Elbow extension    Wrist flexion    Wrist extension    Wrist ulnar deviation    Wrist radial deviation    Wrist pronation    Wrist supination    (Blank rows = not tested)  UPPER EXTREMITY MMT:  MMT Right eval Left eval  Shoulder flexion 4-/5 3+/5  Shoulder extension    Shoulder abduction    Shoulder adduction    Shoulder internal rotation 4/5 4+/5  Shoulder external rotation 3+/5 with familiar pain 4-/5  Middle trapezius    Lower trapezius    Elbow flexion 4+/5 4+/5  Elbow extension 4/5 4+/5  Wrist flexion    Wrist extension    Wrist ulnar deviation    Wrist radial  deviation    Wrist pronation    Wrist supination    Grip strength (lbs)    (Blank rows = not tested)   TODAY'S TREATMENT:                                     10/5 EXERCISE LOG     RT shldr  Exercise Repetitions and Resistance Comments  Pulleys  5 minutes   Wall ladder X10 reps    Towel stretch X15 reps 3 sec holds   Resisted ER  Yellow x20 reps   Resisted IR Yellow x20 reps   Resisted shoulder extension RED x 10 hold 5 secs with cues for scap retraction   Horizontal abduction Yellow x20 reps   B shoulder row Red x20 reps   R shoulder scaption AROM x20 reps   Upper cut 2# > AROM due to fatigue and lack of ROM x20 reps    Blank cell = exercise not performed today   Modalities  Date: 10/5 Vaso: Shoulder, 34 degrees; low pressure, 15 mins, Pain  PATIENT EDUCATION: Education details: heat vs ice, TENS, POC, healing, prognosis, HEP Person educated: Patient Education method: Explanation Education comprehension: verbalized understanding   HOME EXERCISE PROGRAM: HJZWFLLF  ASSESSMENT:  CLINICAL IMPRESSION: Patient presented in clinic with reports of greater R shoulder pain. Patient discouraged by lack of his normal ROM prior to surgery. Patient limited to approximately L4-L3 region for behind the back IR. Scapular wringing also noted especially with R scapula with AROM antigravity exercises. Patient also fatigues fairly quickly with all therex. Mod multimodal cueing required throughout session to avoid compensatory strategies. Normal vasopneumatic response noted following removal of the modality. Patient states that his general pain is staying consistently high.  OBJECTIVE IMPAIRMENTS decreased activity tolerance, decreased ROM, decreased strength, impaired UE functional use, and pain.   ACTIVITY LIMITATIONS lifting, dressing, and reach over head  PARTICIPATION LIMITATIONS: community activity  PERSONAL FACTORS 3+ comorbidities: Orthostatic hypotension, DM, depression, history of falling  are also affecting patient's functional outcome.   REHAB POTENTIAL: Good  CLINICAL DECISION MAKING: Stable/uncomplicated  EVALUATION COMPLEXITY:  Low   GOALS: Goals reviewed with patient? No  LONG TERM GOALS: Target date:12/15/2021   Patient will be independent with his HEP.  Baseline:  Goal status: MET  2.  Patient will be able to demonstrate at least 120 degrees of active right shoulder abduction for improved function reaching overhead.  Baseline:  Goal status: On-going  3.  Patient will be able to demonstrate sufficient right shoulder functional internal rotation to tuck the back of his shirt into his pants for improved function getting dressed.  Baseline:  Goal status: On-going  4.  Patient will be able to complete his daily activities without his familiar pain exceeding 5/10. Baseline:  Goal status: On-going  PLAN: PT FREQUENCY: 2x/week  PT DURATION: 4 weeks  PLANNED INTERVENTIONS: Therapeutic exercises, Therapeutic activity, Neuromuscular re-education, Patient/Family education, Self Care, Joint mobilization, Electrical stimulation, Cryotherapy, Moist heat, Taping, Vasopneumatic device, Manual therapy, and Re-evaluation  PLAN FOR NEXT SESSION: pulleys, resisted IR/ER/extension, and modalities as needed   Standley Brooking, PTA 12/16/2021, 11:56 AM

## 2021-12-21 ENCOUNTER — Ambulatory Visit: Payer: Medicare Other

## 2021-12-21 DIAGNOSIS — M25611 Stiffness of right shoulder, not elsewhere classified: Secondary | ICD-10-CM

## 2021-12-21 DIAGNOSIS — M6281 Muscle weakness (generalized): Secondary | ICD-10-CM | POA: Diagnosis not present

## 2021-12-21 DIAGNOSIS — M25511 Pain in right shoulder: Secondary | ICD-10-CM | POA: Diagnosis not present

## 2021-12-21 NOTE — Therapy (Signed)
OUTPATIENT PHYSICAL THERAPY SHOULDER TREATMENT   Patient Name: [name]. MRN: [id number] DOB:05/20/1945, 76 y.o., male Today's Date: 12/21/2021   PT End of Session - 12/21/21 0906     Visit Number 9    Number of Visits 10    Date for PT Re-Evaluation 12/31/21    PT Start Time 0900    PT Stop Time 0950    PT Time Calculation (min) 50 min    Activity Tolerance Patient tolerated treatment well    Behavior During Therapy [facility] for tasks assessed/performed             Past Medical History:  Diagnosis Date   Arthritis    Asthma    anxiety, tree/grass pollen   BPH (benign prostatic hyperplasia)    Bronchitis    hx of   Bulge of cervical disc without myelopathy    Chest pain    a. 06/2015 MV: EF 56%, no ischemia/infarct.   Complication of anesthesia 91/79/1505   no complications but just says he typically needs "more than expected" to anesthetize; needs CPAP (setting 10) in recovery   Depression    ADD   Insomnia    Low blood pressure    Mitral valve prolapse    a. 06/2015 Echo: EF 65-70%, mild LVH, no rwma, mild MVP of ant leaflet and mild to mod posteriorly directed MR, mildly dil La.   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   JOINT REPLACEMENT     Left, Right Knee, Right hip   KNEE ARTHROSCOPY     right x 3-4, left x 2   KNEE ARTHROTOMY  ~1991   right   LUMBAR FUSION  04/03/2013   [name], L5-S1   POLYPECTOMY     vocal cords 1992   REVERSE SHOULDER ARTHROPLASTY Right 09/27/2021   Procedure: REVERSE SHOULDER ARTHROPLASTY;  Surgeon: Netta Cedars, MD;  Location:  [facility];  Service: Orthopedics;  Laterality: Right;  with ISB   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   Procedure: LEFT TOTAL HIP ARTHROPLASTY ANTERIOR APPROACH;  Surgeon: [name], MD;  Location: [facility];  Service: Orthopedics;  Laterality: Left;   TOTAL HIP REVISION  04/22/2011   Procedure: TOTAL HIP REVISION;  Surgeon: Gearlean Alf, MD;  Location: [facility];  Service: Orthopedics;  Laterality: Right;   Patient Active Problem List   Diagnosis Date Noted   S/P shoulder replacement, right 09/27/2021   Pre-operative respiratory examination 11/13/2019   Ambulatory dysfunction 07/24/2019   Closed nondisplaced fracture of condyle of right femur ([place]) 07/24/2019   Bulge of cervical disc without myelopathy    Falls 07/23/2019   Cough 02/20/2019   Absolute anemia 02/04/2019   Iron deficiency anemia 02/04/2019   Abnormal findings on diagnostic imaging of lung 11/27/2018   ILD (interstitial lung disease) ([place]) 11/27/2018   Mild obesity 08/05/2017   Type 2 diabetes mellitus treated without insulin ([place]) 08/05/2017   MVP (mitral valve prolapse) 08/05/2017   Orthostatic hypotension  01/18/2017   Upper airway cough syndrome 11/23/2016   Cough variant asthma 11/23/2016   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 11/12/2015   Left ventricular hypertrophy 10/03/2015   Mixed hyperlipidemia 10/03/2015   PVCs (premature ventricular contractions) 07/13/2015   Obesity (BMI 30.0-34.9) 07/13/2015   Chest pain 06/29/2015   Uncontrolled diabetes mellitus without complication, without long-term current use of insulin 06/29/2015   Depression 06/29/2015   BPH (benign prostatic hyperplasia) 06/29/2015   OSA on CPAP 12/26/2006   REFERRING PROVIDER: Netta Cedars, MD  REFERRING DIAG: Encounter for other orthopedic aftercare  THERAPY DIAG:  Stiffness of right shoulder, not elsewhere classified  Acute pain of right shoulder  Rationale for Evaluation and Treatment Rehabilitation  ONSET DATE: 09/27/21 RT shldr  surgery R-TSR  SUBJECTIVE:                                                                                                                                                                                      SUBJECTIVE STATEMENT: Patient reports that he put a glycerin patch on his shoulder last night and he thinks that helped it feel better today.   PERTINENT HISTORY: Orthostatic hypotension, DM, depression, history of falling  PAIN:  Are you having pain? Yes: NPRS scale: 2-3/10 Pain location: posterior right shoulder Pain description: sore, piercing pain with motion Aggravating factors: reaching behind him  Relieving factors: getting out of the painful position  PRECAUTIONS: Fall  WEIGHT BEARING RESTRICTIONS No   OBJECTIVE: all objective measures were performed at her initial evaluation on 11/17/21 unless otherwise noted  PATIENT SURVEYS:  FOTO 62.60  UPPER EXTREMITY ROM:   Active ROM Right eval Right 12/21/21 Left eval  Shoulder flexion 124 143 135  Shoulder extension     Shoulder abduction 110 126 125  Shoulder adduction     Shoulder internal rotation To PSIS; limited by pain and stretch To L3 To inferior border of right scapula  Shoulder external rotation To T2 To T2 To T1   Elbow flexion     Elbow extension     Wrist flexion     Wrist extension     Wrist ulnar deviation     Wrist radial deviation     Wrist pronation     Wrist supination     (Blank rows = not tested)  UPPER EXTREMITY MMT:  MMT Right eval Left eval  Shoulder flexion 4-/5 3+/5  Shoulder extension    Shoulder abduction    Shoulder adduction    Shoulder internal rotation 4/5 4+/5  Shoulder external rotation 3+/5 with familiar pain 4-/5  Middle trapezius    Lower trapezius    Elbow flexion 4+/5  4+/5  Elbow extension 4/5 4+/5  Wrist flexion    Wrist extension    Wrist ulnar deviation    Wrist radial deviation    Wrist pronation    Wrist supination    Grip strength (lbs)    (Blank rows  = not tested)   TODAY'S TREATMENT:                                    10/10 EXERCISE LOG  Exercise Repetitions and Resistance Comments  Pulley  5 minutes   Wall ladder  3 minutes   Resisted row Green t-band x 3 minutes   Resisted extension Green t-band x 3 minutes   Resisted IR  Yellow t-band x 2 minutes   Resisted horizontal ABD  Green t-band x 30 reps     Blank cell = exercise not performed today  Manual Therapy Soft Tissue Mobilization: right infraspinatus, for reduced pain Modalities  Date:  Vaso: Shoulder, 34 degrees; low pressure, 10 mins, Pain                                   10/5 EXERCISE LOG     RT shldr  Exercise Repetitions and Resistance Comments  Pulleys  5 minutes   Wall ladder X10 reps    Towel stretch X15 reps 3 sec holds   Resisted ER  Yellow x20 reps   Resisted IR Yellow x20 reps   Resisted shoulder extension RED x 10 hold 5 secs with cues for scap retraction   Horizontal abduction Yellow x20 reps   B shoulder row Red x20 reps   R shoulder scaption AROM x20 reps   Upper cut 2# > AROM due to fatigue and lack of ROM x20 reps    Blank cell = exercise not performed today   Modalities  Date: 10/5 Vaso: Shoulder, 34 degrees; low pressure, 15 mins, Pain  PATIENT EDUCATION: Education details: heat vs ice, TENS, POC, healing, prognosis, HEP Person educated: Patient Education method: Explanation Education comprehension: verbalized understanding   HOME EXERCISE PROGRAM: HJZWFLLF  ASSESSMENT:  CLINICAL IMPRESSION: Patient is making good progress with skilled physical therapy as evidenced by his improved right shoulder mobility and function with his daily activities. He was able to demonstrate a significant improvement in right shoulder AROM and functional mobility. However, his primary limitation at this time is his intermittent right shoulder pain. Treatment focused on familiar interventions for improved shoulder strength. He required moderate cueing with  today's interventions for proper biomechanics and pacing to promote proper musculature engagement. Manual therapy focused on soft tissue mobilization to his right infraspinatus for reduced pain with moderate effectiveness. He reported that his shoulder felt better upon the conclusion of treatment. Recommend that he be discharged from physical therapy with an updated HEP once cleared by his referring physician.   OBJECTIVE IMPAIRMENTS decreased activity tolerance, decreased ROM, decreased strength, impaired UE functional use, and pain.   ACTIVITY LIMITATIONS lifting, dressing, and reach over head  PARTICIPATION LIMITATIONS: community activity  PERSONAL FACTORS 3+ comorbidities: Orthostatic hypotension, DM, depression, history of falling  are also affecting patient's functional outcome.   REHAB POTENTIAL: Good  CLINICAL DECISION MAKING: Stable/uncomplicated  EVALUATION COMPLEXITY: Low   GOALS: Goals reviewed with patient? No  LONG TERM GOALS: Target date:12/15/2021   Patient will be independent with his HEP.  Baseline:  Goal status: MET  2.  Patient will be able to demonstrate at least 120 degrees of active right shoulder abduction for improved function reaching overhead.  Baseline:  Goal status: MET  3.  Patient will be able to demonstrate sufficient right shoulder functional internal rotation to tuck the back of his shirt into his pants for improved function getting dressed.  Baseline:  Goal status: MET  4.  Patient will be able to complete his daily activities without his familiar pain exceeding 5/10. Baseline: 2/3 on average, but can spike up to 7/10 Goal status: On-going  PLAN: PT FREQUENCY: 2x/week  PT DURATION: 4 weeks  PLANNED INTERVENTIONS: Therapeutic exercises, Therapeutic activity, Neuromuscular re-education, Patient/Family education, Self Care, Joint mobilization, Electrical stimulation, Cryotherapy, Moist heat, Taping, Vasopneumatic device, Manual therapy, and  Re-evaluation  PLAN FOR NEXT SESSION: pulleys, resisted IR/ER/extension, and modalities as needed   Darlin Coco, PT 12/21/2021, 12:15 PM

## 2021-12-23 ENCOUNTER — Encounter: Payer: Medicare Other | Admitting: *Deleted

## 2022-01-04 ENCOUNTER — Ambulatory Visit: Payer: Medicare Other | Admitting: Physical Therapy

## 2022-01-11 ENCOUNTER — Ambulatory Visit: Payer: Medicare Other | Admitting: Physical Therapy

## 2022-01-11 ENCOUNTER — Encounter: Payer: Self-pay | Admitting: Physical Therapy

## 2022-01-11 DIAGNOSIS — M6281 Muscle weakness (generalized): Secondary | ICD-10-CM

## 2022-01-11 DIAGNOSIS — M25611 Stiffness of right shoulder, not elsewhere classified: Secondary | ICD-10-CM

## 2022-01-11 DIAGNOSIS — M25511 Pain in right shoulder: Secondary | ICD-10-CM

## 2022-01-11 DIAGNOSIS — Z4789 Encounter for other orthopedic aftercare: Secondary | ICD-10-CM | POA: Diagnosis not present

## 2022-01-11 NOTE — Therapy (Addendum)
OUTPATIENT PHYSICAL THERAPY SHOULDER TREATMENT   Patient Name: [name]. MRN: [id number] DOB:Aug 26, 1945, 76 y.o., male Today's Date: 01/13/2022     Past Medical History:  Diagnosis Date   Arthritis    Asthma    anxiety, tree/grass pollen   BPH (benign prostatic hyperplasia)    Bronchitis    hx of   Bulge of cervical disc without myelopathy    Chest pain    a. 06/2015 MV: EF 56%, no ischemia/infarct.   Complication of anesthesia 50/35/4656   no complications but just says he typically needs "more than expected" to anesthetize; needs CPAP (setting 10) in recovery   Depression    ADD   Insomnia    Low blood pressure    Mitral valve prolapse    a. 06/2015 Echo: EF 65-70%, mild LVH, no rwma, mild MVP of ant leaflet and mild to mod posteriorly directed MR, mildly dil La.   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   JOINT REPLACEMENT     Left, Right Knee, Right hip   KNEE ARTHROSCOPY     right x 3-4, left x 2   KNEE ARTHROTOMY  ~1991   right   LUMBAR FUSION  04/03/2013   [name], L5-S1   POLYPECTOMY     vocal cords 1992   REVERSE SHOULDER ARTHROPLASTY Right 09/27/2021   Procedure: REVERSE SHOULDER ARTHROPLASTY;  Surgeon: Netta Cedars, MD;  Location: [facility];  Service: Orthopedics;  Laterality: Right;  with ISB   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   Procedure: LEFT TOTAL HIP ARTHROPLASTY ANTERIOR APPROACH;  Surgeon: [name], MD;  Location: [facility];  Service: Orthopedics;  Laterality: Left;   TOTAL HIP REVISION  04/22/2011   Procedure: TOTAL HIP REVISION;  Surgeon: Gearlean Alf, MD;  Location: [facility];  Service: Orthopedics;  Laterality: Right;   Patient Active Problem List   Diagnosis Date Noted   S/P shoulder replacement, right 09/27/2021   Pre-operative respiratory examination 11/13/2019   Ambulatory dysfunction 07/24/2019   Closed nondisplaced fracture of condyle of right femur ([place]) 07/24/2019   Bulge of cervical disc without myelopathy    Falls 07/23/2019   Cough 02/20/2019   Absolute anemia 02/04/2019   Iron deficiency anemia 02/04/2019   Abnormal findings on diagnostic imaging of lung 11/27/2018   ILD (interstitial lung disease) ([place]) 11/27/2018   Mild obesity 08/05/2017   Type 2 diabetes mellitus treated without insulin ([place]) 08/05/2017   MVP (mitral valve prolapse) 08/05/2017   Orthostatic hypotension 01/18/2017   Upper airway cough syndrome 11/23/2016   Cough variant asthma 11/23/2016   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 11/12/2015   Left ventricular hypertrophy 10/03/2015   Mixed hyperlipidemia 10/03/2015   PVCs (premature ventricular contractions) 07/13/2015   Obesity (BMI 30.0-34.9) 07/13/2015   Chest pain 06/29/2015   Uncontrolled diabetes mellitus without complication, without long-term current use of insulin 06/29/2015   Depression 06/29/2015   BPH (benign prostatic hyperplasia) 06/29/2015   OSA on  CPAP 12/26/2006   REFERRING PROVIDER: Netta Cedars, MD  REFERRING DIAG: Encounter for other orthopedic aftercare  THERAPY DIAG:  Stiffness of right shoulder, not elsewhere classified  Acute pain of right shoulder  Muscle weakness (generalized)  Rationale for Evaluation and Treatment Rehabilitation  ONSET DATE: 09/27/21 RT shldr surgery R-TSR  SUBJECTIVE:                                                                                                                                                                                      SUBJECTIVE STATEMENT: Patient presented with no desire to do exercises but has increased  tone in R deltoids. Has all the bands at home but still limited with shoulder extension.  PERTINENT HISTORY: Orthostatic hypotension, DM, depression, history of falling  PAIN:  Are you having pain? Always has pain  PRECAUTIONS: Fall  WEIGHT BEARING RESTRICTIONS No   OBJECTIVE: all objective measures were performed at her initial evaluation on 11/17/21 unless otherwise noted  PATIENT SURVEYS:  FOTO 68  10th visit  UPPER EXTREMITY ROM:   Active ROM Right eval Right 12/21/21 Left eval  Shoulder flexion 124 143 135  Shoulder extension     Shoulder abduction 110 126 125  Shoulder adduction     Shoulder internal rotation To PSIS; limited by pain and stretch To L3 To inferior border of right scapula  Shoulder external rotation To T2 To T2 To T1   Elbow flexion     Elbow extension     Wrist flexion     Wrist extension     Wrist ulnar deviation     Wrist radial deviation     Wrist pronation     Wrist supination     (Blank rows = not tested)  UPPER EXTREMITY MMT:  MMT Right eval Left eval  Shoulder flexion 4-/5 3+/5  Shoulder extension    Shoulder abduction    Shoulder adduction    Shoulder internal rotation 4/5 4+/5  Shoulder external rotation 3+/5 with familiar pain 4-/5  Middle trapezius    Lower trapezius    Elbow flexion 4+/5 4+/5  Elbow extension 4/5 4+/5  Wrist flexion    Wrist extension    Wrist ulnar deviation    Wrist radial deviation    Wrist pronation    Wrist supination    Grip strength (lbs)    (Blank rows = not tested)   TODAY'S TREATMENT:   PATIENT EDUCATION: Education details: heat vs ice, TENS, POC, healing, prognosis, HEP Person educated: Patient Education method: Explanation Education comprehension: verbalized understanding   HOME EXERCISE PROGRAM: [id number]  ASSESSMENT:  CLINICAL IMPRESSION: Patient presented in clinic with no interest in  exercises. Patient states that he understands he may have some limitations although with some  disappointment. Patient limited with reaching into extension such as to don a belt. Can now latch the seatbelt with RUE. Met all goals although pain goal may be scewed due to pain medications.  OBJECTIVE IMPAIRMENTS decreased activity tolerance, decreased ROM, decreased strength, impaired UE functional use, and pain.   ACTIVITY LIMITATIONS lifting, dressing, and reach over head  PARTICIPATION LIMITATIONS: community activity  PERSONAL FACTORS 3+ comorbidities: Orthostatic hypotension, DM, depression, history of falling  are also affecting patient's functional outcome.   REHAB POTENTIAL: Good  CLINICAL DECISION MAKING: Stable/uncomplicated  EVALUATION COMPLEXITY: Low   GOALS: Goals reviewed with patient? No  LONG TERM GOALS: Target date:12/15/2021   Patient will be independent with his HEP.  Baseline:  Goal status: MET  2.  Patient will be able to demonstrate at least 120 degrees of active right shoulder abduction for improved function reaching overhead.  Baseline:  Goal status: MET  3.  Patient will be able to demonstrate sufficient right shoulder functional internal rotation to tuck the back of his shirt into his pants for improved function getting dressed.  Baseline:  Goal status: MET  4.  Patient will be able to complete his daily activities without his familiar pain exceeding 5/10. Baseline: 2/3 on average, but can spike up to 7/10 Goal status: MET (also reminds of pain meds that he is prescribed routinely)  PLAN: PT FREQUENCY: 2x/week  PT DURATION: 4 weeks  PLANNED INTERVENTIONS: Therapeutic exercises, Therapeutic activity, Neuromuscular re-education, Patient/Family education, Self Care, Joint mobilization, Electrical stimulation, Cryotherapy, Moist heat, Taping, Vasopneumatic device, Manual therapy, and Re-evaluation  PLAN FOR NEXT SESSION: DC   Standley Brooking, PTA 01/13/2022, 10:00 AM   PHYSICAL THERAPY DISCHARGE SUMMARY  Visits from Start of Care:  10  Current functional level related to goals / functional outcomes: Patient was able to meet all of his goals for skilled physical therapy.    Remaining deficits: None    Education / Equipment: HEP    Patient agrees to discharge. Patient goals were met. Patient is being discharged due to meeting the stated rehab goals.  Jacqulynn Cadet, PT, DPT

## 2022-01-14 DIAGNOSIS — M4326 Fusion of spine, lumbar region: Secondary | ICD-10-CM | POA: Diagnosis not present

## 2022-01-14 DIAGNOSIS — G894 Chronic pain syndrome: Secondary | ICD-10-CM | POA: Diagnosis not present

## 2022-01-14 DIAGNOSIS — Z6828 Body mass index (BMI) 28.0-28.9, adult: Secondary | ICD-10-CM | POA: Diagnosis not present

## 2022-03-11 DIAGNOSIS — G894 Chronic pain syndrome: Secondary | ICD-10-CM | POA: Diagnosis not present

## 2022-03-11 DIAGNOSIS — Z6828 Body mass index (BMI) 28.0-28.9, adult: Secondary | ICD-10-CM | POA: Diagnosis not present

## 2022-03-11 DIAGNOSIS — M4326 Fusion of spine, lumbar region: Secondary | ICD-10-CM | POA: Diagnosis not present

## 2022-04-07 DIAGNOSIS — F3130 Bipolar disorder, current episode depressed, mild or moderate severity, unspecified: Secondary | ICD-10-CM | POA: Diagnosis not present

## 2022-04-08 DIAGNOSIS — Z79891 Long term (current) use of opiate analgesic: Secondary | ICD-10-CM | POA: Diagnosis not present

## 2022-04-08 DIAGNOSIS — G894 Chronic pain syndrome: Secondary | ICD-10-CM | POA: Diagnosis not present

## 2022-04-08 DIAGNOSIS — M4326 Fusion of spine, lumbar region: Secondary | ICD-10-CM | POA: Diagnosis not present

## 2022-04-08 DIAGNOSIS — Z79899 Other long term (current) drug therapy: Secondary | ICD-10-CM | POA: Diagnosis not present

## 2022-04-23 DIAGNOSIS — N390 Urinary tract infection, site not specified: Secondary | ICD-10-CM | POA: Diagnosis not present

## 2022-05-27 ENCOUNTER — Ambulatory Visit
Admission: RE | Admit: 2022-05-27 | Discharge: 2022-05-27 | Disposition: A | Discharge status: Home / Self Care | Payer: Medicare Other | Source: Ambulatory Visit | Attending: Internal Medicine | Admitting: Internal Medicine

## 2022-05-27 ENCOUNTER — Other Ambulatory Visit: Payer: Self-pay | Admitting: Internal Medicine

## 2022-05-27 DIAGNOSIS — I951 Orthostatic hypotension: Secondary | ICD-10-CM | POA: Diagnosis not present

## 2022-05-27 DIAGNOSIS — R410 Disorientation, unspecified: Secondary | ICD-10-CM | POA: Diagnosis not present

## 2022-05-27 DIAGNOSIS — R42 Dizziness and giddiness: Secondary | ICD-10-CM | POA: Diagnosis not present

## 2022-05-27 DIAGNOSIS — R531 Weakness: Secondary | ICD-10-CM | POA: Diagnosis not present

## 2022-05-27 DIAGNOSIS — R270 Ataxia, unspecified: Secondary | ICD-10-CM | POA: Diagnosis not present

## 2022-05-27 DIAGNOSIS — R55 Syncope and collapse: Secondary | ICD-10-CM | POA: Diagnosis not present

## 2022-06-13 DIAGNOSIS — M4326 Fusion of spine, lumbar region: Secondary | ICD-10-CM | POA: Diagnosis not present

## 2022-06-17 DIAGNOSIS — R42 Dizziness and giddiness: Secondary | ICD-10-CM | POA: Diagnosis not present

## 2022-06-17 DIAGNOSIS — N400 Enlarged prostate without lower urinary tract symptoms: Secondary | ICD-10-CM | POA: Diagnosis not present

## 2022-06-17 DIAGNOSIS — N390 Urinary tract infection, site not specified: Secondary | ICD-10-CM | POA: Diagnosis not present

## 2022-06-17 DIAGNOSIS — R32 Unspecified urinary incontinence: Secondary | ICD-10-CM | POA: Diagnosis not present

## 2022-06-17 DIAGNOSIS — I951 Orthostatic hypotension: Secondary | ICD-10-CM | POA: Diagnosis not present

## 2022-06-20 ENCOUNTER — Encounter: Payer: Self-pay | Admitting: Adult Health

## 2022-06-21 ENCOUNTER — Encounter: Payer: Self-pay | Admitting: Neurology

## 2022-06-28 ENCOUNTER — Emergency Department (HOSPITAL_COMMUNITY): Payer: Medicare Other

## 2022-06-28 ENCOUNTER — Emergency Department (HOSPITAL_COMMUNITY)
Admission: EM | Admit: 2022-06-28 | Discharge: 2022-06-28 | Disposition: A | Discharge status: Home / Self Care | Payer: Medicare Other | Attending: Emergency Medicine | Admitting: Emergency Medicine

## 2022-06-28 DIAGNOSIS — Z7984 Long term (current) use of oral hypoglycemic drugs: Secondary | ICD-10-CM | POA: Diagnosis not present

## 2022-06-28 DIAGNOSIS — J8410 Pulmonary fibrosis, unspecified: Secondary | ICD-10-CM | POA: Diagnosis not present

## 2022-06-28 DIAGNOSIS — N390 Urinary tract infection, site not specified: Secondary | ICD-10-CM | POA: Insufficient documentation

## 2022-06-28 DIAGNOSIS — I509 Heart failure, unspecified: Secondary | ICD-10-CM | POA: Diagnosis not present

## 2022-06-28 DIAGNOSIS — M2578 Osteophyte, vertebrae: Secondary | ICD-10-CM | POA: Diagnosis not present

## 2022-06-28 DIAGNOSIS — M5021 Other cervical disc displacement,  high cervical region: Secondary | ICD-10-CM | POA: Diagnosis not present

## 2022-06-28 DIAGNOSIS — E119 Type 2 diabetes mellitus without complications: Secondary | ICD-10-CM | POA: Diagnosis not present

## 2022-06-28 DIAGNOSIS — R4182 Altered mental status, unspecified: Secondary | ICD-10-CM | POA: Diagnosis not present

## 2022-06-28 DIAGNOSIS — Z7982 Long term (current) use of aspirin: Secondary | ICD-10-CM | POA: Insufficient documentation

## 2022-06-28 DIAGNOSIS — W19XXXA Unspecified fall, initial encounter: Secondary | ICD-10-CM | POA: Diagnosis not present

## 2022-06-28 DIAGNOSIS — R531 Weakness: Secondary | ICD-10-CM | POA: Diagnosis not present

## 2022-06-28 LAB — COMPREHENSIVE METABOLIC PANEL
ALT: 20 U/L (ref 0–44)
AST: 28 U/L (ref 15–41)
Albumin: 3.3 g/dL — ABNORMAL LOW (ref 3.5–5.0)
Alkaline Phosphatase: 65 U/L (ref 38–126)
Anion gap: 4 — ABNORMAL LOW (ref 5–15)
BUN: 17 mg/dL (ref 8–23)
CO2: 24 mmol/L (ref 22–32)
Calcium: 8.2 mg/dL — ABNORMAL LOW (ref 8.9–10.3)
Chloride: 102 mmol/L (ref 98–111)
Creatinine, Ser: 0.75 mg/dL (ref 0.61–1.24)
GFR, Estimated: >60 mL/min (ref >60)
Glucose, Bld: 107 mg/dL — ABNORMAL HIGH (ref 70–99)
Potassium: 3.9 mmol/L (ref 3.5–5.1)
Sodium: 130 mmol/L — ABNORMAL LOW (ref 135–145)
Total Bilirubin: 0.3 mg/dL (ref 0.3–1.2)
Total Protein: 7 g/dL (ref 6.5–8.1)

## 2022-06-28 LAB — URINALYSIS, ROUTINE W REFLEX MICROSCOPIC
Bilirubin Urine: NEGATIVE
Glucose, UA: NEGATIVE mg/dL
Ketones, ur: NEGATIVE mg/dL
Nitrite: NEGATIVE
Protein, ur: NEGATIVE mg/dL
Specific Gravity, Urine: 1.008 (ref 1.005–1.030)
WBC, UA: >50 WBC/hpf (ref 0–5)
pH: 5 (ref 5.0–8.0)

## 2022-06-28 LAB — CBC WITH DIFFERENTIAL/PLATELET
Abs Immature Granulocytes: 0.05 10*3/uL (ref 0.00–0.07)
Basophils Absolute: 0.1 10*3/uL (ref 0.0–0.1)
Basophils Relative: 1 %
Eosinophils Absolute: 0.7 10*3/uL — ABNORMAL HIGH (ref 0.0–0.5)
Eosinophils Relative: 7 %
HCT: 32.1 % — ABNORMAL LOW (ref 39.0–52.0)
Hemoglobin: 10.3 g/dL — ABNORMAL LOW (ref 13.0–17.0)
Immature Granulocytes: 1 %
Lymphocytes Relative: 6 %
Lymphs Abs: 0.6 10*3/uL — ABNORMAL LOW (ref 0.7–4.0)
MCH: 29.7 pg (ref 26.0–34.0)
MCHC: 32.1 g/dL (ref 30.0–36.0)
MCV: 92.5 fL (ref 80.0–100.0)
Monocytes Absolute: 0.5 10*3/uL (ref 0.1–1.0)
Monocytes Relative: 5 %
Neutro Abs: 7.9 10*3/uL — ABNORMAL HIGH (ref 1.7–7.7)
Neutrophils Relative %: 80 %
Platelets: 253 10*3/uL (ref 150–400)
RBC: 3.47 MIL/uL — ABNORMAL LOW (ref 4.22–5.81)
RDW: 13.9 % (ref 11.5–15.5)
WBC: 9.7 10*3/uL (ref 4.0–10.5)
nRBC: 0 % (ref 0.0–0.2)

## 2022-06-28 LAB — CK: Total CK: 341 U/L (ref 49–397)

## 2022-06-28 LAB — BRAIN NATRIURETIC PEPTIDE: B Natriuretic Peptide: 180 pg/mL — ABNORMAL HIGH (ref 0.0–100.0)

## 2022-06-28 MED ORDER — SODIUM CHLORIDE 0.9 % IV SOLN
1.0000 g | Freq: Once | INTRAVENOUS | Status: AC
  Administered 2022-06-28: 1 g via INTRAVENOUS
  Filled 2022-06-28: qty 10

## 2022-06-28 MED ORDER — CEPHALEXIN 500 MG PO CAPS: 500.0000 mg | ORAL_CAPSULE | Freq: Two times a day (BID) | ORAL | 0 refills | Status: AC

## 2022-06-28 MED ORDER — SODIUM CHLORIDE 0.9 % IV BOLUS
1000.0000 mL | Freq: Once | INTRAVENOUS | Status: AC
  Administered 2022-06-28: 1000 mL via INTRAVENOUS

## 2022-06-28 MED ORDER — OXYCODONE HCL 5 MG PO TABS
10.0000 mg | ORAL_TABLET | Freq: Once | ORAL | Status: AC
  Administered 2022-06-28: 10 mg via ORAL
  Filled 2022-06-28: qty 2

## 2022-06-28 MED ORDER — DEXAMETHASONE SODIUM PHOSPHATE 10 MG/ML IJ SOLN
10.0000 mg | Freq: Once | INTRAMUSCULAR | Status: AC
  Administered 2022-06-28: 10 mg via INTRAVENOUS
  Filled 2022-06-28: qty 1

## 2022-06-28 NOTE — ED Notes (Signed)
Patient transported to X-ray 

## 2022-06-28 NOTE — ED Notes (Signed)
Patient transported to MRI 

## 2022-06-28 NOTE — ED Triage Notes (Addendum)
[name] for weakness since yesterday. Fell while in the kitchen, did not hit head, no LOC, no blood thinners.

## 2022-06-28 NOTE — Discharge Instructions (Addendum)
With your illness consisting of weakness in your extremities and lower urinary tract infection it is importantly follow-up with your primary care physician for appropriate ongoing outpatient management.  Do not hesitate to return here for concerning changes in your condition.

## 2022-06-28 NOTE — ED Provider Notes (Signed)
[place] [facility] AT [facility] Provider Note   CSN: [id number] Arrival date & time: 06/28/22  1025     History  Chief Complaint  Patient presents with   Weakness    [name]. is a 77 y.o. male.  HPI Patient presents with weakness.  Patient has multiple medical problems, but states that he is generally well until 2 days ago.  Now over the past 2 days patient has had diffuse weakness, without myalgia, without focality without loss of sensation or coordination. Patient had a mild fall yesterday, no head trauma, no loss of consciousness. Wife notes the patient had difficulty getting himself up from the floor for several hours today. No recent change in medication, diet, activity.     Home Medications Prior to Admission medications   Medication Sig Start Date End Date Taking? Authorizing Provider  acetaminophen (TYLENOL) 325 MG tablet Take 650 mg by mouth every 6 (six) hours as needed.   Yes [provider]  alfuzosin (UROXATRAL) 10 MG 24 hr tablet Take 10 mg by mouth daily. 05/20/21  Yes [provider]  aspirin EC 81 MG tablet Take 81 mg by mouth 2 (two) times daily.   Yes [provider]  cephALEXin (KEFLEX) 500 MG capsule Take 1 capsule (500 mg total) by mouth 2 (two) times daily for 5 days. 06/28/22 07/03/22 Yes Gerhard Munch, MD  dutasteride (AVODART) 0.5 MG capsule Take 0.5 mg by mouth daily. 05/30/14  Yes [provider]  Eszopiclone 3 MG TABS Take 3 mg by mouth at bedtime. 09/02/21  Yes [provider]  finasteride (PROSCAR) 5 MG tablet Take 5 mg by mouth daily.   Yes [provider]  gabapentin (NEURONTIN) 400 MG capsule Take 300 mg by mouth 3 (three) times daily. 07/11/19  Yes [provider]  LORazepam (ATIVAN) 0.5 MG tablet Take 0.5 mg by mouth in the morning and at bedtime. 08/23/21  Yes [provider]  metFORMIN (GLUCOPHAGE) 500 MG tablet Take 500 mg by mouth 2 (two)  times daily with a meal.    Yes [provider]  methylphenidate 27 MG PO TB24 Take 27 mg by mouth daily.   Yes [provider]  midodrine (PROAMATINE) 2.5 MG tablet Take 2.5 mg by mouth 2 (two) times daily. 06/17/22  Yes [provider]  Multiple Vitamins-Minerals (CENTRUM SILVER ADULT 50+ PO) Take 1 tablet by mouth daily.   Yes [provider]  Oxycodone HCl 10 MG TABS Take 10 mg by mouth every 6 (six) hours as needed (pain). 06/13/22  Yes [provider]  pravastatin (PRAVACHOL) 40 MG tablet Take 40 mg by mouth daily.   Yes [provider]  tiZANidine (ZANAFLEX) 4 MG tablet Take 4 mg by mouth every 6 (six) hours as needed for muscle spasms. 08/22/21  Yes [provider]  venlafaxine XR (EFFEXOR-XR) 75 MG 24 hr capsule Take 150 mg by mouth every morning. 09/01/21  Yes [provider]  albuterol (VENTOLIN HFA) 108 (90 Base) MCG/ACT inhaler TAKE 2 PUFFS BY MOUTH EVERY 6 HOURS AS NEEDED FOR WHEEZE OR SHORTNESS OF BREATH Patient not taking: Reported on 12/15/2021 10/09/19   Nyoka Cowden, MD  MACROBID 100 MG capsule Take 100 mg by mouth 2 (two) times daily. Patient not taking: Reported on 06/28/2022 06/17/22   [provider]      Allergies    Breo ellipta [fluticasone furoate-vilanterol], Demerol [meperidine], Lamotrigine, and Nsaids    Review  of Systems   Review of Systems  All other systems reviewed and are negative.   Physical Exam Updated Vital Signs BP 128/69   Pulse 67   Temp 99.2 F (37.3 C) (Oral)   Resp 18   SpO2 96%  Physical Exam Vitals and nursing note reviewed.  Constitutional:      General: He is not in acute distress.    Appearance: He is well-developed.  HENT:     Head: Normocephalic and atraumatic.  Eyes:     Conjunctiva/sclera: Conjunctivae normal.  Cardiovascular:     Rate and Rhythm: Normal rate and regular rhythm.     Pulses: Normal pulses.  Pulmonary:     Effort: Pulmonary effort is  normal. No respiratory distress.     Breath sounds: No stridor.  Abdominal:     General: There is no distension.  Musculoskeletal:     Comments: No deformity, patient does move all extremities spontaneously, follows commands, minimally, states that he cannot walk. He lifts his head spontaneously from the bed, looks about the room.  Skin:    General: Skin is warm and dry.  Neurological:     Mental Status: He is alert and oriented to person, place, and time.     Comments: Though the patient describes weakness he does follow commands appropriately, does move his extremities appropriately, face is symmetric, speech is clear.  Psychiatric:        Mood and Affect: Mood normal.        Behavior: Behavior normal.     ED Results / Procedures / Treatments   Labs (all labs ordered are listed, but only abnormal results are displayed) Labs Reviewed  COMPREHENSIVE METABOLIC PANEL - Abnormal; Notable for the following components:      Result Value   Sodium 130 (*)    Glucose, Bld 107 (*)    Calcium 8.2 (*)    Albumin 3.3 (*)    Anion gap 4 (*)    All other components within normal limits  CBC WITH DIFFERENTIAL/PLATELET - Abnormal; Notable for the following components:   RBC 3.47 (*)    Hemoglobin 10.3 (*)    HCT 32.1 (*)    Neutro Abs 7.9 (*)    Lymphs Abs 0.6 (*)    Eosinophils Absolute 0.7 (*)    All other components within normal limits  BRAIN NATRIURETIC PEPTIDE - Abnormal; Notable for the following components:   B Natriuretic Peptide 180.0 (*)    All other components within normal limits  URINALYSIS, ROUTINE W REFLEX MICROSCOPIC - Abnormal; Notable for the following components:   APPearance HAZY (*)    Hgb urine dipstick SMALL (*)    Leukocytes,Ua LARGE (*)    Bacteria, UA FEW (*)    All other components within normal limits  CK    EKG EKG Interpretation  Date/Time:  Tuesday June 28 2022 10:39:22 EDT Ventricular Rate:  78 PR Interval:  182 QRS Duration: 154 QT  Interval:  421 QTC Calculation: 480 R Axis:   -41 Text Interpretation: Sinus rhythm RBBB and LAFB ST-t wave abnormality Abnormal ECG Confirmed by Gerhard Munch [phone]) on 06/28/2022 10:42:05 AM  Radiology CT Head Wo Contrast  Result Date: 06/28/2022 CLINICAL DATA:  Altered mental status EXAM: CT HEAD WITHOUT CONTRAST TECHNIQUE: Contiguous axial images were obtained from the base of the skull through the vertex without intravenous contrast. RADIATION DOSE REDUCTION: This exam was performed according to the departmental dose-optimization program which includes automated exposure control, adjustment of the  mA and/or kV according to patient size and/or use of iterative reconstruction technique. COMPARISON:  CT head 05/27/22 FINDINGS: Brain: No evidence of acute infarction, hemorrhage, hydrocephalus, extra-axial collection or mass lesion/mass effect. Sequela of mild chronic microvascular ischemic change. Vascular: No hyperdense vessel or unexpected calcification. Skull: Normal. Negative for fracture or focal lesion. Sinuses/Orbits: No middle ear or mastoid effusion. Paranasal sinuses are notable for mucosal thickening in the bilateral sphenoid and ethmoid sinuses. Orbits are unremarkable. Other: None. IMPRESSION: No acute intracranial abnormality. Electronically Signed   By: Lorenza Cambridge M.D.   On: 06/28/2022 14:22   MR CERVICAL SPINE WO CONTRAST  Result Date: 06/28/2022 CLINICAL DATA:  Ataxia, nontraumatic, with cervical pathology suspected. Acute cervical myelopathy. EXAM: MRI CERVICAL SPINE WITHOUT CONTRAST TECHNIQUE: Multiplanar, multisequence MR imaging of the cervical spine was performed. No intravenous contrast was administered. COMPARISON:  07/24/2019 FINDINGS: Alignment: Degenerative anterolisthesis at C2-3, C4-5, C5-6, and C7-T1. At C5-6 slip measures 4 mm. Vertebrae: Mild marrow edema at the right C5-6 facet. No fracture or aggressive bone lesion Cord: Degenerative cord deformity described below.  No visible cord edema. Posterior Fossa, vertebral arteries, paraspinal tissues: Negative for perispinal mass or inflammation Disc levels: C2-3: Mild facet spurring and disc bulging C3-4: Disc narrowing and bulging with uncovertebral and facet spurring eccentric to the left. Moderate left foraminal impingement C4-5: Degenerative facet spurring with anterolisthesis. Disc height loss and bulging with uncovertebral ridging. Mild spinal stenosis and cord indentation. Mild bilateral foraminal narrowing C5-6: Disc collapse with endplate and uncovertebral ridging. Degenerative facet spurring on both sides with anterolisthesis. Moderate spinal stenosis with cord flattening. Moderate biforaminal stenosis C6-7: Disc narrowing and bulging with left paracentral protrusion. Uncovertebral spurring on both sides. The canal and foramina are patent C7-T1:Degenerative facet spurring on both sides. Small left paracentral protrusion and an annular fissure. The canal and foramina are patent IMPRESSION: 1. No acute finding.  No visible cord injury. 2. Advanced and generalized cervical spine degeneration with multilevel listhesis that is progressed from 2021. 3. Up to moderate spinal stenosis with cord indentation at C5-6. 4. Foraminal narrowings described above. Electronically Signed   By: Tiburcio Pea M.D.   On: 06/28/2022 13:06   DG Chest 2 View  Result Date: 06/28/2022 CLINICAL DATA:  Weak EXAM: CHEST - 2 VIEW COMPARISON:  CXR 07/23/19 FINDINGS: No pleural effusion. No pneumothorax. There is a hazy opacity at the left lung base, could represent atelectasis or infection. Unchanged cardiac and mediastinal contours. There are prominent bilateral interstitial opacities, favored to represent architectural changes related to underlying fibrotic lung disease. No radiographically apparent displaced rib fracture. Visualized upper abdomen is notable for colonic gaseous distention. Right shoulder arthroplasty. Vertebral body heights are  maintained. IMPRESSION: 1. Hazy opacity at the left lung base could represent atelectasis or infection. 2. Findings of chronic interstitial/fibrotic lung disease. 3. Colonic gaseous distention in the visualized upper abdomen. Electronically Signed   By: Lorenza Cambridge M.D.   On: 06/28/2022 11:49    Procedures Procedures    Medications Ordered in ED Medications  sodium chloride 0.9 % bolus 1,000 mL (0 mLs Intravenous Stopped 06/28/22 1420)  cefTRIAXone (ROCEPHIN) 1 g in sodium chloride 0.9 % 100 mL IVPB (0 g Intravenous Stopped 06/28/22 1345)  oxyCODONE (Oxy IR/ROXICODONE) immediate release tablet 10 mg (10 mg Oral Given 06/28/22 1335)  dexamethasone (DECADRON) injection 10 mg (10 mg Intravenous Given 06/28/22 1417)    ED Course/ Medical Decision Making/ A&P  Medical Decision Making Adult male with multiple medical problems including cervical spine disease, diabetes, depression, presents with diffuse weakness.  Patient is awake, alert, hemodynamically unremarkable, but does have subjective weakness throughout on exam.  Broad differential including dehydration, electrolyte abnormalities, progression of cervical spine disease considered.  Patient placed on monitors, labs sent. Cardiac 80 sinus normal Pulse ox 100% room air normal   Amount and/or Complexity of Data Reviewed Independent Historian: EMS    Details: In addition, patient reportedly fell yesterday, per EMS, no head trauma External Data Reviewed: notes. Labs: ordered. Decision-making details documented in ED Course. Radiology: ordered and independent interpretation performed. Decision-making details documented in ED Course. ECG/medicine tests: ordered and independent interpretation performed. Decision-making details documented in ED Course.  Risk Prescription drug management.   2:59 PM I have reviewed the patient's MRI, CT, labs, and now he has been ambulatory with nursing staff, reportedly moving all  extremities spontaneously, without difficulty.  He and his wife are aware of all findings.  In essence this elderly gentleman with multiple medical problems including known cervical spine disease presents with description of new extremity weakness upper and lower, was found to have MR with evidence of persistent cervical spine disease, but also labs suggesting urinary tract infection likely contributing to his weakness. Patient has been on prophylactic antibiotics, some suspicion for breakthrough infection, but no evidence of bacteremia, sepsis and with no demonstration of specific extremity weakness, with reassuring head CT, neck MRI, patient is appropriate for initiation of antibiotics, outpatient follow-up.        Final Clinical Impression(s) / ED Diagnoses Final diagnoses:  Weakness  Lower urinary tract infection    Rx / DC Orders ED Discharge Orders          Ordered    cephALEXin (KEFLEX) 500 MG capsule  2 times daily        06/28/22 1459              Gerhard Munch, MD 06/28/22 1459

## 2022-06-28 NOTE — ED Notes (Signed)
Pt stood and amb in room with CN  reported did well

## 2022-06-28 NOTE — ED Notes (Signed)
ED Provider at bedside. 

## 2022-06-30 DIAGNOSIS — N390 Urinary tract infection, site not specified: Secondary | ICD-10-CM | POA: Diagnosis not present

## 2022-06-30 DIAGNOSIS — R296 Repeated falls: Secondary | ICD-10-CM | POA: Diagnosis not present

## 2022-06-30 DIAGNOSIS — R55 Syncope and collapse: Secondary | ICD-10-CM | POA: Diagnosis not present

## 2022-06-30 DIAGNOSIS — I951 Orthostatic hypotension: Secondary | ICD-10-CM | POA: Diagnosis not present

## 2022-07-07 NOTE — Progress Notes (Signed)
Initial neurology clinic note  SERVICE DATE: 07/15/22  Reason for Evaluation: Consultation requested by Georgann Housekeeper, MD for an opinion regarding imbalance, dizziness, and falls. My final recommendations will be communicated back to the requesting physician by way of shared medical record or letter to requesting physician via [place] mail.  HPI: This is [name]., a 77 y.o. right-handed male with a medical history of orthostatic hypotension, ILD, iron deficiency anemia, DM2 c/b neuropathy, HLD, depression, OA, OSA (on CPAP), lumbosacral spondylosis s/p surgery x2, former smoker who presents to neurology clinic with the chief complaint of imbalance, dizziness. The patient is accompanied by wife.  Patient first notice drifting side to side for about 2 years. He has noticed his feet points inward. Per patient, symptom onset appears to be after lumbar spine surgery (about 2 years ago). The first surgery was L4-L5-S1 and the second surgery was L3 and S1. He feels like the left quad has not been the same since surgery. He had surgery due to severe back pain and radiating pain to feet. Since surgery, his pain has greatly improved. He endorses numbness in the left upper quad, but otherwise none.   Patient has most trouble when he first gets up, feel lightheaded and dizzy. He has a history of orthostatic hypotension and is on midodrine. That will improve with a little time. When he walks for a longer time, he will have an increase in symptoms. Patient states that if he rests and puts his head below the heart, his symptoms will improve. Patient mentions that when he goes to the store, he will lean on a shopping cart and this will improve his symptoms.  Patient mentions urinary incontinence during sleep for the last 6 months. This happens early in the night and after he changes, it does recur. He denies any fecal incontinence. He denies saddle anesthesia. Of note, there was no ventriculomegaly on [facility]  (06/28/22). There was moderate spinal stenosis at C5-6 on MRI cervical spine from 06/28/22  Patient endorses falls. He has been falling for 3 years, with an increase more recently. He has had 5-6 falls since the beginning of 2024. Wife mentions he is often dehydrated, not drinking much water and instead drinking soda or tea. He has also had a few urinary tract infections. He has fallen 1 time in the last month for which he presented to ED on 06/28/22 with 2 days of diffuse weakness without myalgia or sensory changes. He had fallen and was unable to move well. Since recognizing the dehydration, this may have improved some of the falls.  Patient on is B12, B complex, and a multivitamin. Patient is also on gabapentin [phone] mg daily (400 mg pills, usually 3 times per day). He takes gabapentin for pain, which he feels works. He also takes oxycodone for pain. He also sometimes takes a muscle relaxor. He also takes ativan for anxiety PRN. He is also on effexor.  He does not report any constitutional symptoms like fever, night sweats, anorexia or unintentional weight loss.  EtOH use: No  Restrictive diet? No, but poor diet  He had an EMG many years ago, but doesn't remember.   MEDICATIONS:  Outpatient Encounter Medications as of 07/15/2022  Medication Sig Note   acetaminophen (TYLENOL) 325 MG tablet Take 650 mg by mouth every 6 (six) hours as needed.    albuterol (VENTOLIN HFA) 108 (90 Base) MCG/ACT inhaler TAKE 2 PUFFS BY MOUTH EVERY 6 HOURS AS NEEDED FOR WHEEZE OR  SHORTNESS OF BREATH (Patient not taking: Reported on 12/15/2021)    alfuzosin (UROXATRAL) 10 MG 24 hr tablet Take 10 mg by mouth daily.    aspirin EC 81 MG tablet Take 81 mg by mouth 2 (two) times daily.    dutasteride (AVODART) 0.5 MG capsule Take 0.5 mg by mouth daily.    Eszopiclone 3 MG TABS Take 3 mg by mouth at bedtime. 12/15/2021: Patient takes as needed   finasteride (PROSCAR) 5 MG tablet Take 5 mg by mouth daily.    gabapentin  (NEURONTIN) 400 MG capsule Take 300 mg by mouth 3 (three) times daily.    LORazepam (ATIVAN) 0.5 MG tablet Take 0.5 mg by mouth in the morning and at bedtime.    MACROBID 100 MG capsule Take 100 mg by mouth 2 (two) times daily. (Patient not taking: Reported on 06/28/2022)    metFORMIN (GLUCOPHAGE) 500 MG tablet Take 500 mg by mouth 2 (two) times daily with a meal.     methylphenidate 27 MG PO TB24 Take 27 mg by mouth daily.    midodrine (PROAMATINE) 2.5 MG tablet Take 2.5 mg by mouth 2 (two) times daily.    Multiple Vitamins-Minerals (CENTRUM SILVER ADULT 50+ PO) Take 1 tablet by mouth daily.    Oxycodone HCl 10 MG TABS Take 10 mg by mouth every 6 (six) hours as needed (pain).    pravastatin (PRAVACHOL) 40 MG tablet Take 40 mg by mouth daily.    tiZANidine (ZANAFLEX) 4 MG tablet Take 4 mg by mouth every 6 (six) hours as needed for muscle spasms.    venlafaxine XR (EFFEXOR-XR) 75 MG 24 hr capsule Take 150 mg by mouth every morning.    No facility-administered encounter medications on file as of 07/15/2022.    PAST MEDICAL HISTORY: Past Medical History:  Diagnosis Date   Arthritis    Asthma    anxiety, tree/grass pollen   BPH (benign prostatic hyperplasia)    Bronchitis    hx of   Bulge of cervical disc without myelopathy    Chest pain    a. 06/2015 MV: EF 56%, no ischemia/infarct.   Complication of anesthesia 04/01/2013   no complications but just says he typically needs "more than expected" to anesthetize; needs CPAP (setting 10) in recovery   Depression    ADD   Insomnia    Low blood pressure    Mitral valve prolapse    a. 06/2015 Echo: EF 65-70%, mild LVH, no rwma, mild MVP of ant leaflet and mild to mod posteriorly directed MR, mildly dil La.   Obesity    Pre-diabetes    PVC's (premature ventricular contractions)    Recurrent upper respiratory infection (URI)    states Dr Lequita Halt aware- no fever- states is improving   Sleep apnea    no longer uses cpap due to weight loss    Tremor    d/t lithium    PAST SURGICAL HISTORY: Past Surgical History:  Procedure Laterality Date   CARDIAC CATHETERIZATION     2008 pre op gastric bypass   COLONOSCOPY W/ POLYPECTOMY     GASTRIC BYPASS  2008   Roux en y   JOINT REPLACEMENT     Left, Right Knee, Right hip   KNEE ARTHROSCOPY     right x 3-4, left x 2   KNEE ARTHROTOMY  ~1991   right   LUMBAR FUSION  04/03/2013   Dr Yetta Barre, L5-S1   POLYPECTOMY     vocal cords 1992  REVERSE SHOULDER ARTHROPLASTY Right 09/27/2021   Procedure: REVERSE SHOULDER ARTHROPLASTY;  Surgeon: Beverely Low, MD;  Location: [facility];  Service: Orthopedics;  Laterality: Right;  with ISB   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   Procedure: LEFT TOTAL HIP ARTHROPLASTY ANTERIOR APPROACH;  Surgeon: Durene Romans, MD;  Location: [facility];  Service: Orthopedics;  Laterality: Left;   TOTAL HIP REVISION  04/22/2011   Procedure: TOTAL HIP REVISION;  Surgeon: Loanne Drilling, MD;  Location: [facility];  Service: Orthopedics;  Laterality: Right;    ALLERGIES: Allergies  Allergen Reactions   Breo Ellipta [Fluticasone Furoate-Vilanterol] Other (See Comments)    Thrush   Demerol [Meperidine] Itching   Lamotrigine Hives and Itching   Nsaids Nausea Only    Gastric bypass    FAMILY HISTORY: Family History  Problem Relation Age of Onset   Heart disease Father    Anxiety disorder Mother    Asthma Maternal Grandmother     SOCIAL HISTORY: Social History   Tobacco Use   Smoking status: Former    Packs/day: 1.50    Years: 20.00    Additional pack years: 0.00    Total pack years: 30.00    Types: Cigarettes    Quit date: 04/17/1990    Years since quitting: 32.2   Smokeless tobacco: Never  Vaping Use   Vaping Use: Never used  Substance Use Topics   Alcohol use: No   Drug use: No   Social History   Social History Narrative   Patient works for [employer]. Patient has masters degree. Patient is married.     OBJECTIVE: PHYSICAL EXAM: BP 111/72   Pulse 67    Ht 6\' 2"  (1.88 m)   Wt 201 lb (91.2 kg)   SpO2 97%   BMI 25.81 kg/m   General: General appearance: Awake and alert. No distress. Cooperative with exam.  Skin: No obvious rash or jaundice. HEENT: Atraumatic. Anicteric. Lungs: Non-labored breathing on room air  Extremities: No edema. Psych: Affect appropriate.  Neurological: Mental Status: Alert. Speech fluent. No pseudobulbar affect Cranial Nerves: CNII: No RAPD. Visual fields grossly intact. CNIII, IV, VI: PERRL. No nystagmus. EOMI. CN V: Facial sensation intact bilaterally to fine touch. Masseter clench strong. Jaw jerk negative. CN VII: Facial muscles symmetric and strong. No ptosis at rest. CN VIII: Hearing grossly intact bilaterally. CN IX: No hypophonia. CN X: Palate elevates symmetrically. CN XI: Full strength shoulder shrug bilaterally. CN XII: Tongue protrusion full and midline. No atrophy or fasciculations. Mild dysarthria (dry mouth per patient) Motor: Tone is mildly increased, left upper and lower extremities greater than right  Individual muscle group testing (MRC grade out of 5):  Movement     Neck flexion 5    Neck extension 5     Right Left   Shoulder abduction 5 4+   Shoulder adduction 5 5   Shoulder ext rotation 4+ 4+   Shoulder int rotation 4+ 4+   Elbow flexion 5 5   Elbow extension 5 5   Wrist extension 5 5   Wrist flexion 5 5   Finger abduction - FDI 4+ 4+   Finger abduction - ADM 4+ 4+   Finger extension 5 5   Finger distal flexion - 2/3 5 5    Finger distal flexion - 4/5 5 5    Thumb flexion - FPL 5 5   Thumb abduction - APB 5 5    Hip flexion 5 5   Hip extension 5 5   Hip  adduction 5 5   Hip abduction 5 5   Knee extension 5 5   Knee flexion 5 5   Dorsiflexion 5 4+   Plantarflexion 5 5   Inversion 5 5   Eversion 5 5     Reflexes:  Right Left   Bicep 2-3+ 2-3+   Tricep 2-3+ 2-3+   BrRad 2-3+ 2-3+   Knee 0 3+ Bilateral knee replacement  Ankle 2+ 2+    Pathological  Reflexes: Babinski: equivocal response Hoffman: absent bilaterally Troemner: absent bilaterally Pectoral: Positive on left, negative on right Palmomental: absent bilaterally Facial: absent bilaterally Midline tap: absent Sensation: Pinprick: Increased in left foot, otherwise intact Proprioception: Intact in bilateral great toes Coordination: Intact finger-to- nose-finger bilaterally. Romberg with mild sway. Finger tapping normal in bilateral upper extremities. Toe tapping abnormal in left foot, normal in right foot. Gait: Takes multiple attempts, but able to stand without assistance. Very narrow, scissor gait. Unsteady. No freezing. Unsteady turns, but no en bloc turns  Lab and Test Review: Internal labs: 06/28/22: CK: 341 (stable for 15 years) BNP: 180.0 (elevated) CBC: anemic to Hb of 10.3 CMP: Na 130, albumin mildly low at 3.3  HbA1c (09/22/21): 6.4 B12 (07/23/19): > 7500 TSH (07/23/19): 0.706  Imaging: CT head wo contrast (06/28/22): FINDINGS: Brain: No evidence of acute infarction, hemorrhage, hydrocephalus, extra-axial collection or mass lesion/mass effect. Sequela of mild chronic microvascular ischemic change.   Vascular: No hyperdense vessel or unexpected calcification.   Skull: Normal. Negative for fracture or focal lesion.   Sinuses/Orbits: No middle ear or mastoid effusion. Paranasal sinuses are notable for mucosal thickening in the bilateral sphenoid and ethmoid sinuses. Orbits are unremarkable.   Other: None.   IMPRESSION: No acute intracranial abnormality.  MRI cervical spine wo contrast (06/28/22): FINDINGS: Alignment: Degenerative anterolisthesis at C2-3, C4-5, C5-6, and C7-T1. At C5-6 slip measures 4 mm.   Vertebrae: Mild marrow edema at the right C5-6 facet. No fracture or aggressive bone lesion   Cord: Degenerative cord deformity described below. No visible cord edema.   Posterior Fossa, vertebral arteries, paraspinal tissues: Negative for  perispinal mass or inflammation   Disc levels:   C2-3: Mild facet spurring and disc bulging   C3-4: Disc narrowing and bulging with uncovertebral and facet spurring eccentric to the left. Moderate left foraminal impingement   C4-5: Degenerative facet spurring with anterolisthesis. Disc height loss and bulging with uncovertebral ridging. Mild spinal stenosis and cord indentation. Mild bilateral foraminal narrowing   C5-6: Disc collapse with endplate and uncovertebral ridging. Degenerative facet spurring on both sides with anterolisthesis. Moderate spinal stenosis with cord flattening. Moderate biforaminal stenosis   C6-7: Disc narrowing and bulging with left paracentral protrusion. Uncovertebral spurring on both sides. The canal and foramina are patent   C7-T1:Degenerative facet spurring on both sides. Small left paracentral protrusion and an annular fissure. The canal and foramina are patent   IMPRESSION: 1. No acute finding.  No visible cord injury. 2. Advanced and generalized cervical spine degeneration with multilevel listhesis that is progressed from 2021. 3. Up to moderate spinal stenosis with cord indentation at C5-6. 4. Foraminal narrowings described above.  MRI brain w/wo contrast (05/28/21): FINDINGS: Brain: No acute or subacute infarction, hemorrhage, hydrocephalus, extra-axial collection or mass lesion. Generalized cerebral volume loss. Small remote left cerebral infarct. Minor chronic small vessel ischemic change in the hemispheric white matter and pons. No abnormal enhancement.   Vascular: Preserved flow voids and vascular enhancements.   Skull and upper cervical spine: No  focal marrow lesion or visible fracture deformity. Benign in stable heterogeneity of marrow in the skull base and upper cervical spine.   Sinuses/Orbits: Chronic patchy sinus opacification including T1 hyperintense (proteinaceous type) material in the bilateral sinuses, especially  ethmoids.   IMPRESSION: 1. No acute or subacute insult. Stable senescent changes when compared to 2021. 2. Chronic ethmoid sinusitis.  MRI lumbar spine wo contrast (07/24/19): FINDINGS: Segmentation:  Standard   Alignment:  Grade 1 anterolisthesis at L5-S1, unchanged   Vertebrae: Diffusely heterogeneous bone marrow signal is unchanged since prior examination. No fracture. L5-S1 posterior instrument fusion.   Conus medullaris and cauda equina: Conus extends to the L1 level. Conus and cauda equina appear normal.   Paraspinal and other soft tissues: Negative   Disc levels:   T10-L1 levels are imaged only in the sagittal plane but are normal.   L1-2: Disc desiccation without herniation. No stenosis.   L2-3: Mild facet hypertrophy with small disc bulge. No spinal canal or neural foraminal stenosis.   L3-4: Disc desiccation and small central disc protrusion. Mild facet hypertrophy. No spinal canal or neural foraminal stenosis.   L4-5: Unchanged small central disc protrusion with narrowing of both lateral recesses. No central spinal canal stenosis. Unchanged severe bilateral neural foraminal stenosis.   L5-S1: Grade 1 anterolisthesis. Posterior fusion with decompression. Thecal sac is widely patent. Unchanged severe bilateral neural foraminal stenosis.   IMPRESSION: 1. L5-S1 posterior instrumented fusion and decompression with unchanged severe bilateral neural foraminal stenosis. 2. L4-L5 central disc protrusion with narrowing of both lateral recesses and unchanged severe bilateral neural foraminal stenosis. 3. Unchanged diffusely heterogeneous bone marrow signal.  MRI thoracic spine wo contrast (07/24/19): IMPRESSION: Motion degraded exam.   There is nonspecific diffuse heterogeneity of marrow signal throughout the thoracic spine.   No thoracic compression deformity. Moderate/severe motion degradation of the sagittal STIR sequence limits evaluation for marrow edema  or focal osseous lesions.   Progressive mild-to-moderate disc degeneration throughout the thoracic spine as compared to MRI 02/20/2013.   Thoracic spondylosis is otherwise similar to this prior exam. No more than mild spinal canal stenosis at any level. At T3-T4, a small right center disc protrusion may contact the ventral spinal cord. No significant foraminal narrowing at any level.   No spinal cord signal abnormality is identified within limitations of motion degradation.  ASSESSMENT: [name]. is a 77 y.o. male who presents for evaluation of imbalance and falls. He has a relevant medical history of orthostatic hypotension, ILD, iron deficiency anemia, DM2 c/b neuropathy, HLD, depression, OSA (on CPAP), lumbosacral spondylosis s/p surgery x2, former smoker, and OA. His neurological examination is pertinent for weakness in bilateral upper extremities (asymmetric), asymmetric sensory changes in bilateral lower extremities, asymmetric hyper-reflexia, and scissoring gait. Available diagnostic data is significant for MRI cervical spine showing moderate cervical spine stenosis at C5-6. MRI brain showed generalized atrophy, including of the cerebellum.   This is a complex case. His exam suggesting that his abnormal gait is of central origin (brain vs spinal cord). His imbalance is likely multifactorial with contributions from spinal stenosis (at least cervical), residuals of radiculopathy, neurogenic claudication, cerebellar atrophy, orthostatic hypotension, and polypharmacy. I discussed with patient that a lot of his medications could be contributing to imbalance and ataxia, including gabapentin, opioids, muscle relaxers, and benzos. I encouraged him to speak to physicians who prescribe these to review.   PLAN: -Blood work: Vit E, copper, B6, IFE -MRI thoracic and lumbar spine wo contrast -Physical therapy for  imbalance, radiculopathy, and weakness -Patient encouraged to speak to  physicians who prescribe medications that may be adding to symptoms to review.  -May consider spine surgery referral if not improving or worsening  -Return to clinic in about 3 months  The impression above as well as the plan as outlined below were extensively discussed with the patient (in the company of wife) who voiced understanding. All questions were answered to their satisfaction.  The patient was counseled on pertinent fall precautions per the printed material provided today, and as noted under the "Patient Instructions" section below.  When available, results of the above investigations and possible further recommendations will be communicated to the patient via telephone/MyChart. Patient to call office if not contacted after expected testing turnaround time.   Total time spent reviewing records, interview, history/exam, documentation, and coordination of care on day of encounter:  60 min   Thank you for allowing me to participate in patient's care.  If I can answer any additional questions, I would be pleased to do so.  Jacquelyne Balint, MD   CC: Georgann Housekeeper, MD [address] [place] [address]  CC: Referring provider: Georgann Housekeeper, MD [address] [place],  [place] [address]

## 2022-07-15 ENCOUNTER — Encounter: Payer: Self-pay | Admitting: Neurology

## 2022-07-15 ENCOUNTER — Ambulatory Visit (INDEPENDENT_AMBULATORY_CARE_PROVIDER_SITE_OTHER): Payer: Medicare Other | Admitting: Neurology

## 2022-07-15 ENCOUNTER — Other Ambulatory Visit (INDEPENDENT_AMBULATORY_CARE_PROVIDER_SITE_OTHER): Payer: Medicare Other

## 2022-07-15 VITALS — BP 111/72 | HR 67 | Ht 74.0 in | Wt 201.0 lb

## 2022-07-15 DIAGNOSIS — M4726 Other spondylosis with radiculopathy, lumbar region: Secondary | ICD-10-CM | POA: Diagnosis not present

## 2022-07-15 DIAGNOSIS — M4802 Spinal stenosis, cervical region: Secondary | ICD-10-CM

## 2022-07-15 DIAGNOSIS — I951 Orthostatic hypotension: Secondary | ICD-10-CM

## 2022-07-15 DIAGNOSIS — G319 Degenerative disease of nervous system, unspecified: Secondary | ICD-10-CM | POA: Diagnosis not present

## 2022-07-15 DIAGNOSIS — R29818 Other symptoms and signs involving the nervous system: Secondary | ICD-10-CM

## 2022-07-15 DIAGNOSIS — M489 Spondylopathy, unspecified: Secondary | ICD-10-CM | POA: Diagnosis not present

## 2022-07-15 DIAGNOSIS — R2689 Other abnormalities of gait and mobility: Secondary | ICD-10-CM

## 2022-07-15 LAB — VITAMIN B12: Vitamin B-12: 1495 pg/mL — ABNORMAL HIGH (ref 211–911)

## 2022-07-15 NOTE — Addendum Note (Signed)
Addended by: Lenise Herald on: 07/15/2022 09:59 AM   Modules accepted: Orders

## 2022-07-15 NOTE — Patient Instructions (Signed)
I want to evaluate your symptoms further with the following: -Blood work today -MRI of your thoracic and lumbar spine  I will be in touch when I have your results and any next steps.  I am referring you to physical therapy to work on strength and balance.  I would like to see you back in about 3 months to see how you are doing.  The physicians and staff at [facility] are committed to providing excellent care. You may receive a survey requesting feedback about your experience at our office. We strive to receive "very good" responses to the survey questions. If you feel that your experience would prevent you from giving the office a "very good " response, please contact our office to try to remedy the situation. We may be reached at [phone]. Thank you for taking the time out of your busy day to complete the survey.  Jacquelyne Balint, MD [facility]  Preventing Falls at [facility] are common, often dreaded events in the lives of older people. Aside from the obvious injuries and even death that may result, fall can cause wide-ranging consequences including loss of independence, mental decline, decreased activity and mobility. Younger people are also at risk of falling, especially those with chronic illnesses and fatigue.  Ways to reduce risk for falling Examine diet and medications. Warm foods and alcohol dilate blood vessels, which can lead to dizziness when standing. Sleep aids, antidepressants and pain medications can also increase the likelihood of a fall.  Get a vision exam. Poor vision, cataracts and glaucoma increase the chances of falling.  Check foot gear. Shoes should fit snugly and have a sturdy, nonskid sole and a broad, low heel  Participate in a physician-approved exercise program to build and maintain muscle strength and improve balance and coordination. Programs that use ankle weights or stretch bands are excellent for muscle-strengthening. Water aerobics programs  and low-impact Tai Chi programs have also been shown to improve balance and coordination.  Increase vitamin D intake. Vitamin D improves muscle strength and increases the amount of calcium the body is able to absorb and deposit in bones.  How to prevent falls from common hazards Floors - Remove all loose wires, cords, and throw rugs. Minimize clutter. Make sure rugs are anchored and smooth. Keep furniture in its usual place.  Chairs -- Use chairs with straight backs, armrests and firm seats. Add firm cushions to existing pieces to add height.  Bathroom - Install grab bars and non-skid tape in the tub or shower. Use a bathtub transfer bench or a shower chair with a back support Use an elevated toilet seat and/or safety rails to assist standing from a low surface. Do not use towel racks or bathroom tissue holders to help you stand.  Lighting - Make sure halls, stairways, and entrances are well-lit. Install a night light in your bathroom or hallway. Make sure there is a light switch at the top and bottom of the staircase. Turn lights on if you get up in the middle of the night. Make sure lamps or light switches are within reach of the bed if you have to get up during the night.  Kitchen - Install non-skid rubber mats near the sink and stove. Clean spills immediately. Store frequently used utensils, pots, pans between waist and eye level. This helps prevent reaching and bending. Sit when getting things out of lower cupboards.  Living room/ Bedrooms - Place furniture with wide spaces in between, giving enough room to  move around. Establish a route through the living room that gives you something to hold onto as you walk.  Stairs - Make sure treads, rails, and rugs are secure. Install a rail on both sides of the stairs. If stairs are a threat, it might be helpful to arrange most of your activities on the lower level to reduce the number of times you must climb the stairs.  Entrances and doorways  - Install metal handles on the walls adjacent to the doorknobs of all doors to make it more secure as you travel through the doorway.  Tips for maintaining balance Keep at least one hand free at all times. Try using a backpack or fanny pack to hold things rather than carrying them in your hands. Never carry objects in both hands when walking as this interferes with keeping your balance.  Attempt to swing both arms from front to back while walking. This might require a conscious effort if Parkinson's disease has diminished your movement. It will, however, help you to maintain balance and posture, and reduce fatigue.  Consciously lift your feet off of the ground when walking. Shuffling and dragging of the feet is a common culprit in losing your balance.  When trying to navigate turns, use a "U" technique of facing forward and making a wide turn, rather than pivoting sharply.  Try to stand with your feet shoulder-length apart. When your feet are close together for any length of time, you increase your risk of losing your balance and falling.  Do one thing at a time. Don't try to walk and accomplish another task, such as reading or looking around. The decrease in your automatic reflexes complicates motor function, so the less distraction, the better.  Do not wear rubber or gripping soled shoes, they might "catch" on the floor and cause tripping.  Move slowly when changing positions. Use deliberate, concentrated movements and, if needed, use a grab bar or walking aid. Count 15 seconds between each movement. For example, when rising from a seated position, wait 15 seconds after standing to begin walking.  If balance is a continuous problem, you might want to consider a walking aid such as a cane, walking stick, or walker. Once you've mastered walking with help, you might be ready to try it on your own again.

## 2022-07-22 ENCOUNTER — Telehealth: Payer: Self-pay | Admitting: Neurology

## 2022-07-22 LAB — IMMUNOFIXATION ELECTROPHORESIS
IgG (Immunoglobin G), Serum: 1388 mg/dL (ref 600–1540)
IgM, Serum: 83 mg/dL (ref 50–300)
Immunoglobulin A: 496 mg/dL — ABNORMAL HIGH (ref 70–320)

## 2022-07-22 LAB — VITAMIN E
Gamma-Tocopherol (Vit E): <1 mg/L (ref <4.4)
Vitamin E (Alpha Tocopherol): 9 mg/L (ref 5.7–19.9)

## 2022-07-22 LAB — COPPER, SERUM: Copper: 93 ug/dL (ref 70–175)

## 2022-07-22 NOTE — Telephone Encounter (Signed)
Pt's wife called in and left a message. She stated they have not heard back about the pt's lab work or the referrals for the MRI or PT.

## 2022-07-22 NOTE — Telephone Encounter (Signed)
Called Patient and [facility] have both tried to contact patient and left messages. Labs have not been posted or completed at this time and I helped reset the patients My Chart account

## 2022-07-27 ENCOUNTER — Ambulatory Visit
Admission: RE | Admit: 2022-07-27 | Discharge: 2022-07-27 | Disposition: A | Discharge status: Home / Self Care | Payer: Medicare Other | Source: Ambulatory Visit | Attending: Neurology | Admitting: Neurology

## 2022-07-27 DIAGNOSIS — I951 Orthostatic hypotension: Secondary | ICD-10-CM

## 2022-07-27 DIAGNOSIS — M489 Spondylopathy, unspecified: Secondary | ICD-10-CM

## 2022-07-27 DIAGNOSIS — R2681 Unsteadiness on feet: Secondary | ICD-10-CM | POA: Diagnosis not present

## 2022-07-27 DIAGNOSIS — R2689 Other abnormalities of gait and mobility: Secondary | ICD-10-CM

## 2022-07-27 DIAGNOSIS — M4726 Other spondylosis with radiculopathy, lumbar region: Secondary | ICD-10-CM

## 2022-07-27 DIAGNOSIS — M4802 Spinal stenosis, cervical region: Secondary | ICD-10-CM

## 2022-07-27 DIAGNOSIS — R29818 Other symptoms and signs involving the nervous system: Secondary | ICD-10-CM

## 2022-07-27 DIAGNOSIS — G319 Degenerative disease of nervous system, unspecified: Secondary | ICD-10-CM

## 2022-07-27 DIAGNOSIS — M549 Dorsalgia, unspecified: Secondary | ICD-10-CM | POA: Diagnosis not present

## 2022-07-29 DIAGNOSIS — N3020 Other chronic cystitis without hematuria: Secondary | ICD-10-CM | POA: Diagnosis not present

## 2022-07-29 DIAGNOSIS — I951 Orthostatic hypotension: Secondary | ICD-10-CM | POA: Diagnosis not present

## 2022-08-02 ENCOUNTER — Telehealth: Payer: Self-pay | Admitting: Neurology

## 2022-08-02 NOTE — Telephone Encounter (Signed)
Called patient to discuss the results of his MRI thoracic and lumbar spine. Thoracic spine was unremarkable. Lumbar spine did show some neural foraminal stenosis at L3 (right > left) which could explain the symptoms in his thighs. 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   Patient is calling PT today to get this set up. He will call if he has new or worsening symptoms.  He will follow up in 11/2022 as planned.  All questions were answered.  Jacquelyne Balint, MD [facility]

## 2022-08-10 DIAGNOSIS — N3020 Other chronic cystitis without hematuria: Secondary | ICD-10-CM | POA: Diagnosis not present

## 2022-08-10 DIAGNOSIS — N3941 Urge incontinence: Secondary | ICD-10-CM | POA: Diagnosis not present

## 2022-08-10 DIAGNOSIS — N401 Enlarged prostate with lower urinary tract symptoms: Secondary | ICD-10-CM | POA: Diagnosis not present

## 2022-08-10 DIAGNOSIS — N200 Calculus of kidney: Secondary | ICD-10-CM | POA: Diagnosis not present

## 2022-08-18 DIAGNOSIS — M5416 Radiculopathy, lumbar region: Secondary | ICD-10-CM | POA: Diagnosis not present

## 2022-08-18 DIAGNOSIS — M4326 Fusion of spine, lumbar region: Secondary | ICD-10-CM | POA: Diagnosis not present

## 2022-08-18 DIAGNOSIS — G894 Chronic pain syndrome: Secondary | ICD-10-CM | POA: Diagnosis not present

## 2022-08-31 DIAGNOSIS — R300 Dysuria: Secondary | ICD-10-CM | POA: Diagnosis not present

## 2022-08-31 DIAGNOSIS — N200 Calculus of kidney: Secondary | ICD-10-CM | POA: Diagnosis not present

## 2022-09-05 DIAGNOSIS — F3130 Bipolar disorder, current episode depressed, mild or moderate severity, unspecified: Secondary | ICD-10-CM | POA: Diagnosis not present

## 2022-09-14 DIAGNOSIS — R3914 Feeling of incomplete bladder emptying: Secondary | ICD-10-CM | POA: Diagnosis not present

## 2022-09-14 DIAGNOSIS — N3941 Urge incontinence: Secondary | ICD-10-CM | POA: Diagnosis not present

## 2022-09-20 DIAGNOSIS — Z0000 Encounter for general adult medical examination without abnormal findings: Secondary | ICD-10-CM | POA: Diagnosis not present

## 2022-09-20 DIAGNOSIS — J849 Interstitial pulmonary disease, unspecified: Secondary | ICD-10-CM | POA: Diagnosis not present

## 2022-09-20 DIAGNOSIS — F3342 Major depressive disorder, recurrent, in full remission: Secondary | ICD-10-CM | POA: Diagnosis not present

## 2022-09-20 DIAGNOSIS — I951 Orthostatic hypotension: Secondary | ICD-10-CM | POA: Diagnosis not present

## 2022-09-20 DIAGNOSIS — N401 Enlarged prostate with lower urinary tract symptoms: Secondary | ICD-10-CM | POA: Diagnosis not present

## 2022-09-20 DIAGNOSIS — N3020 Other chronic cystitis without hematuria: Secondary | ICD-10-CM | POA: Diagnosis not present

## 2022-09-20 DIAGNOSIS — I700 Atherosclerosis of aorta: Secondary | ICD-10-CM | POA: Diagnosis not present

## 2022-09-20 DIAGNOSIS — R269 Unspecified abnormalities of gait and mobility: Secondary | ICD-10-CM | POA: Diagnosis not present

## 2022-09-20 DIAGNOSIS — R32 Unspecified urinary incontinence: Secondary | ICD-10-CM | POA: Diagnosis not present

## 2022-09-20 DIAGNOSIS — E1169 Type 2 diabetes mellitus with other specified complication: Secondary | ICD-10-CM | POA: Diagnosis not present

## 2022-09-20 DIAGNOSIS — D509 Iron deficiency anemia, unspecified: Secondary | ICD-10-CM | POA: Diagnosis not present

## 2022-09-20 DIAGNOSIS — E7800 Pure hypercholesterolemia, unspecified: Secondary | ICD-10-CM | POA: Diagnosis not present

## 2022-09-21 DIAGNOSIS — N3020 Other chronic cystitis without hematuria: Secondary | ICD-10-CM | POA: Diagnosis not present

## 2022-09-21 DIAGNOSIS — N200 Calculus of kidney: Secondary | ICD-10-CM | POA: Diagnosis not present

## 2022-09-21 DIAGNOSIS — N3941 Urge incontinence: Secondary | ICD-10-CM | POA: Diagnosis not present

## 2022-09-21 DIAGNOSIS — N401 Enlarged prostate with lower urinary tract symptoms: Secondary | ICD-10-CM | POA: Diagnosis not present

## 2022-10-03 DIAGNOSIS — F3130 Bipolar disorder, current episode depressed, mild or moderate severity, unspecified: Secondary | ICD-10-CM | POA: Diagnosis not present

## 2022-10-13 DIAGNOSIS — Z96611 Presence of right artificial shoulder joint: Secondary | ICD-10-CM | POA: Diagnosis not present

## 2022-10-31 DIAGNOSIS — D473 Essential (hemorrhagic) thrombocythemia: Secondary | ICD-10-CM | POA: Diagnosis not present

## 2022-11-02 DIAGNOSIS — N3020 Other chronic cystitis without hematuria: Secondary | ICD-10-CM | POA: Diagnosis not present

## 2022-11-02 DIAGNOSIS — R351 Nocturia: Secondary | ICD-10-CM | POA: Diagnosis not present

## 2022-11-02 DIAGNOSIS — N401 Enlarged prostate with lower urinary tract symptoms: Secondary | ICD-10-CM | POA: Diagnosis not present

## 2022-11-02 DIAGNOSIS — R3912 Poor urinary stream: Secondary | ICD-10-CM | POA: Diagnosis not present

## 2022-11-02 DIAGNOSIS — N3941 Urge incontinence: Secondary | ICD-10-CM | POA: Diagnosis not present

## 2022-11-07 DIAGNOSIS — G894 Chronic pain syndrome: Secondary | ICD-10-CM | POA: Diagnosis not present

## 2022-11-07 DIAGNOSIS — M4326 Fusion of spine, lumbar region: Secondary | ICD-10-CM | POA: Diagnosis not present

## 2022-11-07 DIAGNOSIS — Z6828 Body mass index (BMI) 28.0-28.9, adult: Secondary | ICD-10-CM | POA: Diagnosis not present

## 2022-11-13 DIAGNOSIS — Z23 Encounter for immunization: Secondary | ICD-10-CM | POA: Diagnosis not present

## 2022-11-17 NOTE — Progress Notes (Signed)
I saw Ronald Robello. in neurology clinic on 11/24/22 in follow up for imbalance and falls.  HPI: [name]. is a 77 y.o. year old male with a history of orthostatic hypotension, ILD, iron deficiency anemia, DM2 c/b neuropathy, HLD, depression, OA, OSA (on CPAP), lumbosacral spondylosis s/p surgery x2, former smoker who we last saw on 07/15/22.  To briefly review: Patient first notice drifting side to side for about 2 years. He has noticed his feet points inward. Per patient, symptom onset appears to be after lumbar spine surgery (about 2 years ago). The first surgery was L4-L5-S1 and the second surgery was L3 and S1. He feels like the left quad has not been the same since surgery. He had surgery due to severe back pain and radiating pain to feet. Since surgery, his pain has greatly improved. He endorses numbness in the left upper quad, but otherwise none.    Patient has most trouble when he first gets up, feel lightheaded and dizzy. He has a history of orthostatic hypotension and is on midodrine. That will improve with a little time. When he walks for a longer time, he will have an increase in symptoms. Patient states that if he rests and puts his head below the heart, his symptoms will improve. Patient mentions that when he goes to the store, he will lean on a shopping cart and this will improve his symptoms.   Patient mentions urinary incontinence during sleep for the last 6 months. This happens early in the night and after he changes, it does recur. He denies any fecal incontinence. He denies saddle anesthesia. Of note, there was no ventriculomegaly on [facility] (06/28/22). There was moderate spinal stenosis at C5-6 on MRI cervical spine from 06/28/22   Patient endorses falls. He has been falling for 3 years, with an increase more recently. He has had 5-6 falls since the beginning of 2024. Wife mentions he is often dehydrated, not drinking much water and instead drinking soda or tea. He has  also had a few urinary tract infections. He has fallen 1 time in the last month for which he presented to ED on 06/28/22 with 2 days of diffuse weakness without myalgia or sensory changes. He had fallen and was unable to move well. Since recognizing the dehydration, this may have improved some of the falls.   Patient on is B12, B complex, and a multivitamin. Patient is also on gabapentin [phone] mg daily (400 mg pills, usually 3 times per day). He takes gabapentin for pain, which he feels works. He also takes oxycodone for pain. He also sometimes takes a muscle relaxor. He also takes ativan for anxiety PRN. He is also on effexor.   He does not report any constitutional symptoms like fever, night sweats, anorexia or unintentional weight loss.   EtOH use: No  Restrictive diet? No, but poor diet   He had an EMG many years ago, but doesn't remember.  Most recent Assessment and Plan (07/15/22): [name]. is a 77 y.o. male who presents for evaluation of imbalance and falls. He has a relevant medical history of orthostatic hypotension, ILD, iron deficiency anemia, DM2 c/b neuropathy, HLD, depression, OSA (on CPAP), lumbosacral spondylosis s/p surgery x2, former smoker, and OA. His neurological examination is pertinent for weakness in bilateral upper extremities (asymmetric), asymmetric sensory changes in bilateral lower extremities, asymmetric hyper-reflexia, and scissoring gait. Available diagnostic data is significant for MRI cervical spine showing moderate cervical spine stenosis at  C5-6. MRI brain showed generalized atrophy, including of the cerebellum.    This is a complex case. His exam suggesting that his abnormal gait is of central origin (brain vs spinal cord). His imbalance is likely multifactorial with contributions from spinal stenosis (at least cervical), residuals of radiculopathy, neurogenic claudication, cerebellar atrophy, orthostatic hypotension, and polypharmacy. I discussed with  patient that a lot of his medications could be contributing to imbalance and ataxia, including gabapentin, opioids, muscle relaxers, and benzos. I encouraged him to speak to physicians who prescribe these to review.    PLAN: -Blood work: Vit E, copper, B6, IFE -MRI thoracic and lumbar spine wo contrast -Physical therapy for imbalance, radiculopathy, and weakness -Patient encouraged to speak to physicians who prescribe medications that may be adding to symptoms to review.  -May consider spine surgery referral if not improving or worsening  Since their last visit: Labs were normal. Per my telephone note from 08/02/22: Called patient to discuss the results of his MRI thoracic and lumbar spine. Thoracic spine was unremarkable. Lumbar spine did show some neural foraminal stenosis at L3 (right > left) which could explain the symptoms in his thighs. 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    Patient is calling PT today to get this set up. He will call if he has new or worsening symptoms.  Patient's imbalance is about the same. He has fallen a couple of times, but he mentions this was when he was getting over a ramp. He is concerned about weight loss and thinks he is losing muscle mass. He has numbness in his toes.   Patient has been reluctant to go to PT. He is not active due to concerns of hurting himself (knees, hips, back). Patient has an AFO for left foot drop since back surgery, but has not been using it.   MEDICATIONS:  Outpatient Encounter Medications as of 11/24/2022  Medication Sig Note   acetaminophen (TYLENOL) 325 MG tablet Take 650 mg by mouth every 6 (six) hours as needed.    albuterol (VENTOLIN HFA) 108 (90 Base) MCG/ACT inhaler TAKE 2 PUFFS BY MOUTH EVERY 6 HOURS AS NEEDED FOR WHEEZE OR SHORTNESS OF BREATH    alfuzosin (UROXATRAL) 10 MG 24 hr  tablet Take 10 mg by mouth daily.    aspirin EC 81 MG tablet Take 81 mg by mouth 2 (two) times daily.    dutasteride (AVODART) 0.5 MG capsule Take 0.5 mg by mouth daily.    Eszopiclone 3 MG TABS Take 3 mg by mouth at bedtime. 12/15/2021: Patient takes as needed   finasteride (PROSCAR) 5 MG tablet Take 5 mg by mouth daily.    gabapentin (NEURONTIN) 400 MG capsule Take 300 mg by mouth 3 (three) times daily.    LORazepam (ATIVAN) 0.5 MG tablet Take 0.5 mg by mouth in the morning and at bedtime.    MACROBID 100 MG capsule Take 100 mg by mouth 2 (two) times daily.    metFORMIN (GLUCOPHAGE) 500 MG tablet Take 500 mg by mouth 2 (two) times daily with a meal.     midodrine (PROAMATINE) 2.5 MG tablet Take 2.5 mg by mouth 2 (two) times daily.    Multiple Vitamins-Minerals (CENTRUM SILVER ADULT 50+ PO) Take 1 tablet by mouth daily.    Oxycodone HCl 10 MG TABS Take 10 mg by mouth every 6 (six) hours as needed (pain).  pravastatin (PRAVACHOL) 40 MG tablet Take 40 mg by mouth daily.    tiZANidine (ZANAFLEX) 4 MG tablet Take 4 mg by mouth every 6 (six) hours as needed for muscle spasms.    venlafaxine XR (EFFEXOR-XR) 75 MG 24 hr capsule Take 150 mg by mouth every morning.    methylphenidate 27 MG PO TB24 Take 27 mg by mouth daily. (Patient not taking: Reported on 11/24/2022)    No facility-administered encounter medications on file as of 11/24/2022.    PAST MEDICAL HISTORY: Past Medical History:  Diagnosis Date   Arthritis    Asthma    anxiety, tree/grass pollen   BPH (benign prostatic hyperplasia)    Bronchitis    hx of   Bulge of cervical disc without myelopathy    Chest pain    a. 06/2015 MV: EF 56%, no ischemia/infarct.   Complication of anesthesia 04/01/2013   no complications but just says he typically needs "more than expected" to anesthetize; needs CPAP (setting 10) in recovery   Depression    ADD   Insomnia    Low blood pressure    Mitral valve prolapse    a. 06/2015 Echo: EF 65-70%,  mild LVH, no rwma, mild MVP of ant leaflet and mild to mod posteriorly directed MR, mildly dil La.   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   JOINT REPLACEMENT     Left, Right Knee, Right hip   KNEE ARTHROSCOPY     right x 3-4, left x 2   KNEE ARTHROTOMY  ~1991   right   LUMBAR FUSION  04/03/2013   Dr Yetta Barre, L5-S1   POLYPECTOMY     vocal cords 1992   REVERSE SHOULDER ARTHROPLASTY Right 09/27/2021   Procedure: REVERSE SHOULDER ARTHROPLASTY;  Surgeon: Beverely Low, MD;  Location: [facility];  Service: Orthopedics;  Laterality: Right;  with ISB   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   Procedure: LEFT TOTAL HIP ARTHROPLASTY ANTERIOR APPROACH;  Surgeon: Durene Romans, MD;  Location: [facility];  Service: Orthopedics;  Laterality: Left;   TOTAL HIP REVISION  04/22/2011   Procedure: TOTAL HIP REVISION;  Surgeon: Loanne Drilling, MD;  Location: [facility];  Service: Orthopedics;  Laterality: Right;    ALLERGIES: Allergies  Allergen Reactions   Breo Ellipta [Fluticasone Furoate-Vilanterol] Other (See Comments)    Thrush   Demerol [Meperidine] Itching   Lamotrigine Hives and Itching   Nsaids Nausea Only    Gastric bypass    FAMILY HISTORY: Family History  Problem Relation Age of Onset   Heart disease Father    Anxiety disorder Mother    Asthma Maternal Grandmother     SOCIAL HISTORY: Social History   Tobacco Use   Smoking status: Former    Current packs/day: 0.00    Average packs/day: 1.5 packs/day for 20.0 years (30.0 ttl pk-yrs)    Types: Cigarettes    Start date:  04/17/1970    Quit date: 04/17/1990  Years since quitting: 32.6   Smokeless tobacco: Never  Vaping Use   Vaping status: Never Used  Substance Use Topics   Alcohol use: No   Drug use: No   Social History   Social History Narrative   Patient works for [employer]. Patient has masters degree. Patient is married.   Are you right handed or left handed? right   Are you currently employed ?    What is your current occupation?retired   Do you live at home alone?   Who lives with you? wife   What type of home do you live in: 1 story or 2 story? one    Caffeine 1 cup 3 sodas    Objective:  Vital Signs:  BP (!) 149/87   Pulse 78   Ht 6\' 2"  (1.88 m)   Wt 197 lb (89.4 kg)   SpO2 97%   BMI 25.29 kg/m   General: General appearance: Awake and alert. No distress. Cooperative with exam.  Skin: No obvious rash or jaundice. HEENT: Atraumatic. Anicteric. Lungs: Non-labored breathing on room air  Extremities: No edema. Arthritic changes in bilateral hands. Musculoskeletal: No obvious joint swelling.  Neurological: Mental Status: Alert. Speech fluent. No pseudobulbar affect Cranial Nerves: CNII: No RAPD. Visual fields intact. CNIII, IV, VI: PERRL. No nystagmus. EOMI. CN V: Facial sensation intact bilaterally to fine touch. CN VII: Facial muscles symmetric and strong. No ptosis at rest. CN VIII: Hears finger rub well bilaterally. CN IX: No hypophonia. CN X: Palate elevates symmetrically. CN XI: Full strength shoulder shrug bilaterally. CN XII: Tongue protrusion full and midline. No atrophy or fasciculations. No significant dysarthria Motor: Tone is normal. No obvious muscle atrophy.  Individual muscle group testing (MRC grade out of 5):  Movement     Neck flexion 5    Neck extension 5     Right Left   Shoulder abduction 5 5   Shoulder adduction 5 5   Shoulder ext rotation 4 5 Hx of right shoulder surgery  Shoulder int rotation 5 5   Elbow flexion 5 5   Elbow extension 5 5    Wrist extension 5 5   Wrist flexion 5 5   Finger abduction - FDI 5 5   Finger abduction - ADM 5 5   Finger extension 5 5   Finger distal flexion - 2/3 5 5    Finger distal flexion - 4/5 5 5    Thumb flexion - FPL 5 5   Thumb abduction - APB 5 5    Hip flexion 5 4   Hip extension 5 5   Hip adduction 5 5   Hip abduction 5 5   Knee extension 5 5   Knee flexion 5 5   Dorsiflexion 5 4+   Plantarflexion 5 5   Inversion 5 5   Eversion 5 5-   Great toe extension 5- 5-   Great toe flexion 5- 5-     Reflexes:  Right Left  Bicep 2-3+ 2-3+  Tricep 2-3+ 2-3+  BrRad 2-3+ 2-3+  Knee 2+ 2+  Ankle Trace Trace   Pathological Reflexes: Babinski: mute response bilaterally Hoffman: absent bilaterally Troemner: absent bilaterally Sensation: Pinprick: Intact in all extremities Vibration: Intact except: Diminished in left great toe Coordination: Intact finger-to- nose-finger and heel-to-shin bilaterally. Romberg negative. Gait: Able to rise from chair with arms crossed unassisted. Narrow based. Left foot drop.   Lab and Test Review: New results: 07/15/22: Vit E wnl B12: 1495 Copper wnl IFE: no  M protein  MRI thoracic and lumbar spine wo contrast (07/27/22): FINDINGS: MRI THORACIC SPINE FINDINGS   Alignment:  Normal   Vertebrae: Very heterogeneous marrow signal but not significantly changed since 2021. The could be due to smoking, anemia, osteoporosis or chronic marrow disorder. No fractures or worrisome bone lesions. No abnormal STIR signal intensity.   Cord:  Normal cord signal intensity.  No cord lesions or syrinx.   Paraspinal and other soft tissues: No significant paraspinal findings.   Disc levels:   T1-2: Mild annular bulge but no significant spinal or foraminal stenosis.   T3-4: Small central disc protrusion with mild impression on the ventral thecal sac but no significant canal stenosis or foraminal stenosis.   The other intervertebral disc spaces are  unremarkable. No spinal or foraminal stenosis.   MRI LUMBAR SPINE FINDINGS   Segmentation: There are five lumbar type vertebral bodies. The last full intervertebral disc space is labeled L5-S1.   Alignment: Lumbar fusion hardware at L4-5 and L5-S1. The L4-5 fusion hardware is new since the prior study. Significant anterolisthesis of L4 measuring approximately 10 mm. This measured approximately 5 mm on the prior study. Stable anterolisthesis of L5 estimated at 7 mm.   Vertebrae: Very heterogeneous marrow signal as noted in the thoracic spine but unchanged.   Conus medullaris and cauda equina: Conus extends to the L1 level. Conus and cauda equina appear normal.   Paraspinal and other soft tissues: No significant paraspinal retroperitoneal findings.   Disc levels:   T12-L1: No significant findings.   L1-2: Diffuse annular bulge and very shallow paracentral disc protrusion and mild facet disease with mild bilateral lateral recess encroachment but no significant spinal or foraminal stenosis.   L2-3: Diffuse bulging and uncovered disc along with facet disease and ligamentum flavum thickening contributing to mild underlying spinal and bilateral lateral recess stenosis, right greater than left. There is also a shallow right paracentral disc osteophyte complex with mass effect the right L3 nerve root in the recess. This is a progressive finding.   L3-4: No disc protrusions, spinal or foraminal stenosis. Moderate facet disease. Wide decompressive laminectomy.   L4-5: Posterior and interbody fusion hardware. Wide decompressive laminectomy without obvious spinal foraminal stenosis.   L5-S1: Exam limited by artifact. Stable fusion hardware with anterolisthesis and severe facet disease. Wide decompressive laminectomy. No obvious spinal foraminal stenosis   IMPRESSION: 1. Small central disc protrusion at T3-4 with mild impression on the ventral thecal sac but no significant canal  or foraminal stenosis. 2. Very heterogeneous marrow signal but not significantly changed since 2021. The could be due to smoking, anemia, osteoporosis or chronic marrow disorder. 3. Wide decompressive laminectomies at L3-4, L4-5 and L5-S1. No obvious spinal foraminal stenosis. 4. Progressive degenerative disc disease and facet disease at L2-3. There is mild spinal and bilateral lateral recess stenosis, right greater than left. There is also a shallow right paracentral disc osteophyte complex with mass effect on the right L3 nerve root in the recess.  Previously reviewed results: 06/28/22: CK: 341 (stable for 15 years) BNP: 180.0 (elevated) CBC: anemic to Hb of 10.3 CMP: Na 130, albumin mildly low at 3.3   HbA1c (09/22/21): 6.4 B12 (07/23/19): > 7500 TSH (07/23/19): 0.706   Imaging: CT head wo contrast (06/28/22): FINDINGS: Brain: No evidence of acute infarction, hemorrhage, hydrocephalus, extra-axial collection or mass lesion/mass effect. Sequela of mild chronic microvascular ischemic change.   Vascular: No hyperdense vessel or unexpected calcification.   Skull: Normal. Negative for fracture or focal  lesion.   Sinuses/Orbits: No middle ear or mastoid effusion. Paranasal sinuses are notable for mucosal thickening in the bilateral sphenoid and ethmoid sinuses. Orbits are unremarkable.   Other: None.   IMPRESSION: No acute intracranial abnormality.   MRI cervical spine wo contrast (06/28/22): FINDINGS: Alignment: Degenerative anterolisthesis at C2-3, C4-5, C5-6, and C7-T1. At C5-6 slip measures 4 mm.   Vertebrae: Mild marrow edema at the right C5-6 facet. No fracture or aggressive bone lesion   Cord: Degenerative cord deformity described below. No visible cord edema.   Posterior Fossa, vertebral arteries, paraspinal tissues: Negative for perispinal mass or inflammation   Disc levels:   C2-3: Mild facet spurring and disc bulging   C3-4: Disc narrowing and bulging  with uncovertebral and facet spurring eccentric to the left. Moderate left foraminal impingement   C4-5: Degenerative facet spurring with anterolisthesis. Disc height loss and bulging with uncovertebral ridging. Mild spinal stenosis and cord indentation. Mild bilateral foraminal narrowing   C5-6: Disc collapse with endplate and uncovertebral ridging. Degenerative facet spurring on both sides with anterolisthesis. Moderate spinal stenosis with cord flattening. Moderate biforaminal stenosis   C6-7: Disc narrowing and bulging with left paracentral protrusion. Uncovertebral spurring on both sides. The canal and foramina are patent   C7-T1:Degenerative facet spurring on both sides. Small left paracentral protrusion and an annular fissure. The canal and foramina are patent   IMPRESSION: 1. No acute finding.  No visible cord injury. 2. Advanced and generalized cervical spine degeneration with multilevel listhesis that is progressed from 2021. 3. Up to moderate spinal stenosis with cord indentation at C5-6. 4. Foraminal narrowings described above.   MRI brain w/wo contrast (05/28/21): FINDINGS: Brain: No acute or subacute infarction, hemorrhage, hydrocephalus, extra-axial collection or mass lesion. Generalized cerebral volume loss. Small remote left cerebral infarct. Minor chronic small vessel ischemic change in the hemispheric white matter and pons. No abnormal enhancement.   Vascular: Preserved flow voids and vascular enhancements.   Skull and upper cervical spine: No focal marrow lesion or visible fracture deformity. Benign in stable heterogeneity of marrow in the skull base and upper cervical spine.   Sinuses/Orbits: Chronic patchy sinus opacification including T1 hyperintense (proteinaceous type) material in the bilateral sinuses, especially ethmoids.   IMPRESSION: 1. No acute or subacute insult. Stable senescent changes when compared to 2021. 2. Chronic ethmoid  sinusitis.   MRI lumbar spine wo contrast (07/24/19): FINDINGS: Segmentation:  Standard   Alignment:  Grade 1 anterolisthesis at L5-S1, unchanged   Vertebrae: Diffusely heterogeneous bone marrow signal is unchanged since prior examination. No fracture. L5-S1 posterior instrument fusion.   Conus medullaris and cauda equina: Conus extends to the L1 level. Conus and cauda equina appear normal.   Paraspinal and other soft tissues: Negative   Disc levels:   T10-L1 levels are imaged only in the sagittal plane but are normal.   L1-2: Disc desiccation without herniation. No stenosis.   L2-3: Mild facet hypertrophy with small disc bulge. No spinal canal or neural foraminal stenosis.   L3-4: Disc desiccation and small central disc protrusion. Mild facet hypertrophy. No spinal canal or neural foraminal stenosis.   L4-5: Unchanged small central disc protrusion with narrowing of both lateral recesses. No central spinal canal stenosis. Unchanged severe bilateral neural foraminal stenosis.   L5-S1: Grade 1 anterolisthesis. Posterior fusion with decompression. Thecal sac is widely patent. Unchanged severe bilateral neural foraminal stenosis.   IMPRESSION: 1. L5-S1 posterior instrumented fusion and decompression with unchanged severe bilateral neural foraminal stenosis. 2. L4-L5  central disc protrusion with narrowing of both lateral recesses and unchanged severe bilateral neural foraminal stenosis. 3. Unchanged diffusely heterogeneous bone marrow signal.   MRI thoracic spine wo contrast (07/24/19): IMPRESSION: Motion degraded exam.   There is nonspecific diffuse heterogeneity of marrow signal throughout the thoracic spine.   No thoracic compression deformity. Moderate/severe motion degradation of the sagittal STIR sequence limits evaluation for marrow edema or focal osseous lesions.   Progressive mild-to-moderate disc degeneration throughout the thoracic spine as compared to MRI  02/20/2013.   Thoracic spondylosis is otherwise similar to this prior exam. No more than mild spinal canal stenosis at any level. At T3-T4, a small right center disc protrusion may contact the ventral spinal cord. No significant foraminal narrowing at any level.   No spinal cord signal abnormality is identified within limitations of motion degradation.  ASSESSMENT: This is Ronald Ply., a 77 y.o. male with imbalance and left leg weakness (left foot drop). His symptoms are most consistent with know lumbar spine disease, with prior surgery at L4-5 and L5-S1 and possible mass effect at L3. Patient has been inactive and reluctant to go to PT for unclear reasons, with deconditioning likely causing his increase in weakness.  Plan: -Continue to follow with spine - sees Dr. Noel Gerold -Physical therapy for left leg weakness and imbalance -Fall precautions discussed -Patient would also benefit from wearing his AFO  Return to clinic in 6 months  Total time spent reviewing records, interview, history/exam, documentation, and coordination of care on day of encounter:  30 min  Jacquelyne Balint, MD

## 2022-11-24 ENCOUNTER — Encounter: Payer: Self-pay | Admitting: Neurology

## 2022-11-24 ENCOUNTER — Ambulatory Visit (INDEPENDENT_AMBULATORY_CARE_PROVIDER_SITE_OTHER): Payer: Medicare Other | Admitting: Neurology

## 2022-11-24 VITALS — BP 122/78 | HR 78 | Ht 74.0 in | Wt 197.0 lb

## 2022-11-24 DIAGNOSIS — M489 Spondylopathy, unspecified: Secondary | ICD-10-CM | POA: Diagnosis not present

## 2022-11-24 DIAGNOSIS — R2689 Other abnormalities of gait and mobility: Secondary | ICD-10-CM

## 2022-11-24 DIAGNOSIS — R29898 Other symptoms and signs involving the musculoskeletal system: Secondary | ICD-10-CM | POA: Diagnosis not present

## 2022-11-24 DIAGNOSIS — R29818 Other symptoms and signs involving the nervous system: Secondary | ICD-10-CM

## 2022-11-24 DIAGNOSIS — G319 Degenerative disease of nervous system, unspecified: Secondary | ICD-10-CM | POA: Diagnosis not present

## 2022-11-24 DIAGNOSIS — M4726 Other spondylosis with radiculopathy, lumbar region: Secondary | ICD-10-CM | POA: Diagnosis not present

## 2022-11-24 NOTE — Patient Instructions (Addendum)
I think the weakness in your left leg is because of your lumbar spine issues. You need physical therapy to help with the spine tightening, weakness, and imbalance. I am placing this referral again today.  Continue to stay in touch with your spine doctor, Dr. Noel Gerold, as well given the lumbar spine findings.  I will see you back in 6 months or sooner if needed.  The physicians and staff at [facility] are committed to providing excellent care. You may receive a survey requesting feedback about your experience at our office. We strive to receive "very good" responses to the survey questions. If you feel that your experience would prevent you from giving the office a "very good " response, please contact our office to try to remedy the situation. We may be reached at [phone]. Thank you for taking the time out of your busy day to complete the survey.  Jacquelyne Balint, MD [facility]  Preventing Falls at [facility] are common, often dreaded events in the lives of older people. Aside from the obvious injuries and even death that may result, fall can cause wide-ranging consequences including loss of independence, mental decline, decreased activity and mobility. Younger people are also at risk of falling, especially those with chronic illnesses and fatigue.  Ways to reduce risk for falling Examine diet and medications. Warm foods and alcohol dilate blood vessels, which can lead to dizziness when standing. Sleep aids, antidepressants and pain medications can also increase the likelihood of a fall.  Get a vision exam. Poor vision, cataracts and glaucoma increase the chances of falling.  Check foot gear. Shoes should fit snugly and have a sturdy, nonskid sole and a broad, low heel  Participate in a physician-approved exercise program to build and maintain muscle strength and improve balance and coordination. Programs that use ankle weights or stretch bands are excellent for  muscle-strengthening. Water aerobics programs and low-impact Tai Chi programs have also been shown to improve balance and coordination.  Increase vitamin D intake. Vitamin D improves muscle strength and increases the amount of calcium the body is able to absorb and deposit in bones.  How to prevent falls from common hazards Floors - Remove all loose wires, cords, and throw rugs. Minimize clutter. Make sure rugs are anchored and smooth. Keep furniture in its usual place.  Chairs -- Use chairs with straight backs, armrests and firm seats. Add firm cushions to existing pieces to add height.  Bathroom - Install grab bars and non-skid tape in the tub or shower. Use a bathtub transfer bench or a shower chair with a back support Use an elevated toilet seat and/or safety rails to assist standing from a low surface. Do not use towel racks or bathroom tissue holders to help you stand.  Lighting - Make sure halls, stairways, and entrances are well-lit. Install a night light in your bathroom or hallway. Make sure there is a light switch at the top and bottom of the staircase. Turn lights on if you get up in the middle of the night. Make sure lamps or light switches are within reach of the bed if you have to get up during the night.  Kitchen - Install non-skid rubber mats near the sink and stove. Clean spills immediately. Store frequently used utensils, pots, pans between waist and eye level. This helps prevent reaching and bending. Sit when getting things out of lower cupboards.  Living room/ Bedrooms - Place furniture with wide spaces in between, giving enough room to  move around. Establish a route through the living room that gives you something to hold onto as you walk.  Stairs - Make sure treads, rails, and rugs are secure. Install a rail on both sides of the stairs. If stairs are a threat, it might be helpful to arrange most of your activities on the lower level to reduce the number of times you must climb  the stairs.  Entrances and doorways - Install metal handles on the walls adjacent to the doorknobs of all doors to make it more secure as you travel through the doorway.  Tips for maintaining balance Keep at least one hand free at all times. Try using a backpack or fanny pack to hold things rather than carrying them in your hands. Never carry objects in both hands when walking as this interferes with keeping your balance.  Attempt to swing both arms from front to back while walking. This might require a conscious effort if Parkinson's disease has diminished your movement. It will, however, help you to maintain balance and posture, and reduce fatigue.  Consciously lift your feet off of the ground when walking. Shuffling and dragging of the feet is a common culprit in losing your balance.  When trying to navigate turns, use a "U" technique of facing forward and making a wide turn, rather than pivoting sharply.  Try to stand with your feet shoulder-length apart. When your feet are close together for any length of time, you increase your risk of losing your balance and falling.  Do one thing at a time. Don't try to walk and accomplish another task, such as reading or looking around. The decrease in your automatic reflexes complicates motor function, so the less distraction, the better.  Do not wear rubber or gripping soled shoes, they might "catch" on the floor and cause tripping.  Move slowly when changing positions. Use deliberate, concentrated movements and, if needed, use a grab bar or walking aid. Count 15 seconds between each movement. For example, when rising from a seated position, wait 15 seconds after standing to begin walking.  If balance is a continuous problem, you might want to consider a walking aid such as a cane, walking stick, or walker. Once you've mastered walking with help, you might be ready to try it on your own again.

## 2022-12-12 DIAGNOSIS — F3130 Bipolar disorder, current episode depressed, mild or moderate severity, unspecified: Secondary | ICD-10-CM | POA: Diagnosis not present

## 2022-12-13 ENCOUNTER — Ambulatory Visit: Payer: Medicare Other | Attending: Neurology

## 2022-12-13 ENCOUNTER — Other Ambulatory Visit: Payer: Self-pay

## 2022-12-13 DIAGNOSIS — M6281 Muscle weakness (generalized): Secondary | ICD-10-CM | POA: Insufficient documentation

## 2022-12-13 DIAGNOSIS — R29898 Other symptoms and signs involving the musculoskeletal system: Secondary | ICD-10-CM | POA: Diagnosis not present

## 2022-12-13 DIAGNOSIS — Z9181 History of falling: Secondary | ICD-10-CM | POA: Insufficient documentation

## 2022-12-13 DIAGNOSIS — R2689 Other abnormalities of gait and mobility: Secondary | ICD-10-CM | POA: Insufficient documentation

## 2022-12-13 DIAGNOSIS — M489 Spondylopathy, unspecified: Secondary | ICD-10-CM | POA: Diagnosis not present

## 2022-12-13 NOTE — Therapy (Signed)
OUTPATIENT PHYSICAL THERAPY LOWER EXTREMITY EVALUATION   Patient Name: [name]. MRN: [id number] DOB:1946/02/07, 77 y.o., male Today's Date: 12/13/2022  END OF SESSION:  PT End of Session - 12/13/22 1019     Visit Number 1    Number of Visits 12    Date for PT Re-Evaluation 03/10/23    PT Start Time 1019    PT Stop Time 1058    PT Time Calculation (min) 39 min    Activity Tolerance Patient tolerated treatment well    Behavior During Therapy WFL for tasks assessed/performed             Past Medical History:  Diagnosis Date   Arthritis    Asthma    anxiety, tree/grass pollen   BPH (benign prostatic hyperplasia)    Bronchitis    hx of   Bulge of cervical disc without myelopathy    Chest pain    a. 06/2015 MV: EF 56%, no ischemia/infarct.   Complication of anesthesia 04/01/2013   no complications but just says he typically needs "more than expected" to anesthetize; needs CPAP (setting 10) in recovery   Depression    ADD   Insomnia    Low blood pressure    Mitral valve prolapse    a. 06/2015 Echo: EF 65-70%, mild LVH, no rwma, mild MVP of ant leaflet and mild to mod posteriorly directed MR, mildly dil La.   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   JOINT REPLACEMENT     Left, Right Knee, Right hip   KNEE ARTHROSCOPY     right x 3-4, left x 2   KNEE ARTHROTOMY  ~1991   right   LUMBAR FUSION  04/03/2013   Dr Yetta Barre, L5-S1   POLYPECTOMY     vocal cords 1992   REVERSE SHOULDER ARTHROPLASTY Right 09/27/2021   Procedure: REVERSE SHOULDER ARTHROPLASTY;  Surgeon: Beverely Low, MD;  Location: [facility];  Service: Orthopedics;  Laterality: Right;  with ISB   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   Procedure: LEFT TOTAL HIP ARTHROPLASTY ANTERIOR APPROACH;  Surgeon: Durene Romans, MD;  Location: [facility];  Service: Orthopedics;  Laterality: Left;   TOTAL HIP REVISION  04/22/2011   Procedure: TOTAL HIP REVISION;  Surgeon: Loanne Drilling, MD;  Location: [facility];  Service: Orthopedics;  Laterality: Right;   Patient Active Problem List   Diagnosis Date Noted   S/P shoulder replacement, right 09/27/2021   Pre-operative respiratory examination 11/13/2019   Ambulatory dysfunction 07/24/2019   Closed nondisplaced fracture of condyle of right femur (HCC) 07/24/2019   Bulge of cervical disc without myelopathy    Falls 07/23/2019   Cough 02/20/2019   Absolute anemia 02/04/2019   Iron deficiency anemia 02/04/2019   Abnormal findings on diagnostic imaging of lung 11/27/2018   ILD (interstitial lung disease) (HCC) 11/27/2018   Mild obesity 08/05/2017   Type 2 diabetes mellitus treated without insulin (HCC) 08/05/2017   MVP (mitral valve prolapse) 08/05/2017  Orthostatic hypotension 01/18/2017   Upper airway cough syndrome 11/23/2016   Cough variant asthma 11/23/2016   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 11/12/2015   Left ventricular hypertrophy 10/03/2015   Mixed hyperlipidemia 10/03/2015   PVCs (premature ventricular contractions) 07/13/2015   Obesity (BMI 30.0-34.9) 07/13/2015   Chest pain 06/29/2015   Uncontrolled diabetes mellitus without complication, without long-term current use of insulin 06/29/2015   Depression 06/29/2015   BPH (benign prostatic hyperplasia) 06/29/2015   OSA on CPAP 12/26/2006   REFERRING PROVIDER: Antony Madura, MD   REFERRING DIAG: Imbalance; Cervical spine disease; Left leg weakness   THERAPY DIAG:  History of falling  Muscle weakness (generalized)  Rationale for Evaluation and Treatment: Rehabilitation  ONSET DATE: September  2022  SUBJECTIVE:   SUBJECTIVE STATEMENT: Patient reports that he has been having numbness down his left leg and pain in his low back and left hip. He feels that he is also losing his back. He feels that he has also been getting weaker since his surgery about 2 years ago. He notes that his balance has "gone to shit." He has fallen multiple times in the "couple weeks." However, he feels like his symptoms have been staying fairly steady. He has begun to notice that his left leg will cross over his right which can cause him to lose his balance.   PERTINENT HISTORY: Orthostatic hypotension, PVC's, asthma, diabetes type 2, depression, and arthritis PAIN:  Are you having pain? Yes: NPRS scale: 5-6/10 Pain location: low back and both hips (L>R) Pain description: intermittent sharp pain "like an electric shock" Aggravating factors: none known Relieving factors: "getting off my feet"   PRECAUTIONS: Fall  RED FLAGS: None   WEIGHT BEARING RESTRICTIONS: No  FALLS:  Has patient fallen in last 6 months? Yes. Number of falls 2; tripped going up a ramp and then the last fall he got his legs crossed up  LIVING ENVIRONMENT: Lives with: lives with their spouse Lives in: House/apartment Stairs: Yes: External: 1 steps; none Has following equipment at home: Single point cane and Walker - 2 wheeled  OCCUPATION: retired   PLOF: Independent  PATIENT GOALS: improved strength and balance so he can walk more to be able to take outdoor pictures, and be able to walk around his driveway safely (about 0.25 miles around)   NEXT MD VISIT: 05/24/23  OBJECTIVE:  Note: Objective measures were completed at Evaluation unless otherwise noted.  COGNITION: Overall cognitive status: Within functional limits for tasks assessed     SENSATION: Light touch: WFL and no significant deficits bilaterally Patient reports numbness radiating down his left leg into his toes.  EDEMA:  No edema observed  POSTURE: rounded  shoulders, forward head, and flexed trunk   LOWER EXTREMITY ROM: WFL for activities assessed  LOWER EXTREMITY MMT:  MMT Right eval Left eval  Hip flexion 4-/5 3+/5  Hip extension    Hip abduction    Hip adduction    Hip internal rotation    Hip external rotation    Knee flexion 4+/5 4-/5  Knee extension 5/5 4+/5  Ankle dorsiflexion 4-/5 3/5  Ankle plantarflexion    Ankle inversion    Ankle eversion     (Blank rows = not tested)  FUNCTIONAL TESTS:  5 times sit to stand: 17.30 seconds without UE support; required no assistance with first 4 repetitions, but required maxA for safety due to a LOB on the last repetition  Timed up and go (TUG): 11.44 seconds Stand to  sit: uncontrolled descent with poor eccentric quadriceps control  BALANCE:   Narrow BOS, eyes open, firm surface: 30 seconds   Narrow BOS, eyes closed, firm surface: 30 seconds   Tandem (tested with both feet leading), eyes open, firm surface: <1 second  GAIT: Assistive device utilized: None Level of assistance: SBA Comments: left hip IR with decreased foot clearance and stride length bilaterally   TODAY'S TREATMENT:                                                                                                                              DATE:     PATIENT EDUCATION:  Education details: Plan of care, healing, prognosis, using an assistive device for community ambulation for safety, objective findings, and goals for therapy Person educated: Patient Education method: Explanation Education comprehension: verbalized understanding  HOME EXERCISE PROGRAM:   ASSESSMENT:  CLINICAL IMPRESSION: Patient is a 77 y.o. male who was seen today for physical therapy evaluation and treatment for deconditioning and a history of falling. He is at a high risk of falling as evidenced by his gait mechanics, objective measures, and his history of falling. Recommend that he continue with skilled physical therapy to address his  remaining impairments to maximize his safety and functional mobility.    OBJECTIVE IMPAIRMENTS: Abnormal gait, decreased activity tolerance, decreased balance, decreased mobility, difficulty walking, decreased strength, postural dysfunction, and pain.   ACTIVITY LIMITATIONS: carrying, lifting, standing, squatting, stairs, transfers, and locomotion level  PARTICIPATION LIMITATIONS: meal prep, cleaning, laundry, shopping, community activity, and yard work  PERSONAL FACTORS: Age, Past/current experiences, Time since onset of injury/illness/exacerbation, and 3+ comorbidities: Orthostatic hypotension, PVC's, asthma, diabetes type 2, depression, and arthritis  are also affecting patient's functional outcome.   REHAB POTENTIAL: Fair    CLINICAL DECISION MAKING: Evolving/moderate complexity  EVALUATION COMPLEXITY: Moderate   GOALS: Goals reviewed with patient? Yes  SHORT TERM GOALS: Target date: 01/03/23 Patient will be independent with his initial HEP.  Baseline: Goal status: INITIAL  2.  Patient will be able to safely transfer from sitting to standing with no external support for improved independence.  Baseline:  Goal status: INITIAL  LONG TERM GOALS: Target date: 01/24/23  Patient will be independent with his advanced HEP.  Baseline:  Goal status: INITIAL  2.  Patient will improve his five time sit to stand time to 12 seconds or less to reduce his fall risk.  Baseline:  Goal status: INITIAL  3.  Patient will report being able to walk around his driveway with minimal to no difficulty.  Baseline:  Goal status: INITIAL  4.  Patient will improve his stability in tandem stance to five seconds or greater bilaterally for improve safety in a narrow base of support.  Baseline:  Goal status: INITIAL  PLAN:  PT FREQUENCY: 2x/week  PT DURATION: 6 weeks  PLANNED INTERVENTIONS: Therapeutic exercises, Therapeutic activity, Neuromuscular re-education, Balance training, Gait training,  Patient/Family education, Self Care, Joint mobilization, Stair  training, Cryotherapy, Moist heat, Manual therapy, and Re-evaluation  PLAN FOR NEXT SESSION: NuStep, lower extremity strengthening, and balance interventions   Granville Lewis, PT 12/13/2022, 12:42 PM

## 2022-12-20 ENCOUNTER — Encounter: Payer: Self-pay | Admitting: *Deleted

## 2022-12-20 ENCOUNTER — Ambulatory Visit: Payer: Medicare Other | Admitting: *Deleted

## 2022-12-20 DIAGNOSIS — Z9181 History of falling: Secondary | ICD-10-CM | POA: Diagnosis not present

## 2022-12-20 DIAGNOSIS — M6281 Muscle weakness (generalized): Secondary | ICD-10-CM | POA: Diagnosis not present

## 2022-12-20 DIAGNOSIS — M489 Spondylopathy, unspecified: Secondary | ICD-10-CM | POA: Diagnosis not present

## 2022-12-20 DIAGNOSIS — R29898 Other symptoms and signs involving the musculoskeletal system: Secondary | ICD-10-CM | POA: Diagnosis not present

## 2022-12-20 DIAGNOSIS — R2689 Other abnormalities of gait and mobility: Secondary | ICD-10-CM | POA: Diagnosis not present

## 2022-12-20 NOTE — Therapy (Signed)
OUTPATIENT PHYSICAL THERAPY LOWER EXTREMITY EVALUATION   Patient Name: [name]. MRN: [id number] DOB:11/25/1945, 77 y.o., male Today's Date: 12/20/2022  END OF SESSION:  PT End of Session - 12/20/22 1032     Visit Number 2    Number of Visits 12    Date for PT Re-Evaluation 03/10/23    PT Start Time 1015    PT Stop Time 1105    PT Time Calculation (min) 50 min             Past Medical History:  Diagnosis Date   Arthritis    Asthma    anxiety, tree/grass pollen   BPH (benign prostatic hyperplasia)    Bronchitis    hx of   Bulge of cervical disc without myelopathy    Chest pain    a. 06/2015 MV: EF 56%, no ischemia/infarct.   Complication of anesthesia 04/01/2013   no complications but just says he typically needs "more than expected" to anesthetize; needs CPAP (setting 10) in recovery   Depression    ADD   Insomnia    Low blood pressure    Mitral valve prolapse    a. 06/2015 Echo: EF 65-70%, mild LVH, no rwma, mild MVP of ant leaflet and mild to mod posteriorly directed MR, mildly dil La.   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   JOINT REPLACEMENT     Left, Right Knee, Right hip   KNEE ARTHROSCOPY     right x 3-4, left x 2   KNEE ARTHROTOMY  ~1991   right   LUMBAR FUSION  04/03/2013   Dr Yetta Barre, L5-S1   POLYPECTOMY     vocal cords 1992   REVERSE SHOULDER ARTHROPLASTY Right 09/27/2021   Procedure: REVERSE SHOULDER ARTHROPLASTY;  Surgeon: Beverely Low, MD;  Location: [facility];  Service: Orthopedics;  Laterality: Right;  with ISB   TOTAL HIP ARTHROPLASTY Left  10/21/2014   Procedure: LEFT TOTAL HIP ARTHROPLASTY ANTERIOR APPROACH;  Surgeon: Durene Romans, MD;  Location: [facility];  Service: Orthopedics;  Laterality: Left;   TOTAL HIP REVISION  04/22/2011   Procedure: TOTAL HIP REVISION;  Surgeon: Loanne Drilling, MD;  Location: [facility];  Service: Orthopedics;  Laterality: Right;   Patient Active Problem List   Diagnosis Date Noted   S/P shoulder replacement, right 09/27/2021   Pre-operative respiratory examination 11/13/2019   Ambulatory dysfunction 07/24/2019   Closed nondisplaced fracture of condyle of right femur (HCC) 07/24/2019   Bulge of cervical disc without myelopathy    Falls 07/23/2019   Cough 02/20/2019   Absolute anemia 02/04/2019   Iron deficiency anemia 02/04/2019   Abnormal findings on diagnostic imaging of lung 11/27/2018   ILD (interstitial lung disease) (HCC) 11/27/2018   Mild obesity 08/05/2017   Type 2 diabetes mellitus treated without insulin (HCC) 08/05/2017   MVP (mitral valve prolapse) 08/05/2017   Orthostatic hypotension 01/18/2017   Upper airway cough syndrome 11/23/2016   Cough variant asthma 11/23/2016  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 11/12/2015   Left ventricular hypertrophy 10/03/2015   Mixed hyperlipidemia 10/03/2015   PVCs (premature ventricular contractions) 07/13/2015   Obesity (BMI 30.0-34.9) 07/13/2015   Chest pain 06/29/2015   Uncontrolled diabetes mellitus without complication, without long-term current use of insulin 06/29/2015   Depression 06/29/2015   BPH (benign prostatic hyperplasia) 06/29/2015   OSA on CPAP 12/26/2006   REFERRING PROVIDER: Antony Madura, MD   REFERRING DIAG: Imbalance; Cervical spine disease; Left leg weakness   THERAPY DIAG:  History of falling  Muscle weakness (generalized)  Rationale for Evaluation and Treatment: Rehabilitation  ONSET DATE: September 2022  SUBJECTIVE:   SUBJECTIVE STATEMENT: Doing okay. Nerve damage and LT side weakness   PERTINENT  HISTORY: Orthostatic hypotension, PVC's, asthma, diabetes type 2, depression, and arthritis PAIN:  Are you having pain? Yes: NPRS scale: 5-6/10 Pain location: low back and both hips (L>R) Pain description: intermittent sharp pain "like an electric shock" Aggravating factors: none known Relieving factors: "getting off my feet"   PRECAUTIONS: Fall  RED FLAGS: None   WEIGHT BEARING RESTRICTIONS: No  FALLS:  Has patient fallen in last 6 months? Yes. Number of falls 2; tripped going up a ramp and then the last fall he got his legs crossed up  LIVING ENVIRONMENT: Lives with: lives with their spouse Lives in: House/apartment Stairs: Yes: External: 1 steps; none Has following equipment at home: Single point cane and Walker - 2 wheeled  OCCUPATION: retired   PLOF: Independent  PATIENT GOALS: improved strength and balance so he can walk more to be able to take outdoor pictures, and be able to walk around his driveway safely (about 0.25 miles around)   NEXT MD VISIT: 05/24/23  OBJECTIVE:  Note: Objective measures were completed at Evaluation unless otherwise noted.  COGNITION: Overall cognitive status: Within functional limits for tasks assessed     SENSATION: Light touch: WFL and no significant deficits bilaterally Patient reports numbness radiating down his left leg into his toes.  EDEMA:  No edema observed  POSTURE: rounded shoulders, forward head, and flexed trunk   LOWER EXTREMITY ROM: WFL for activities assessed  LOWER EXTREMITY MMT:  MMT Right eval Left eval  Hip flexion 4-/5 3+/5  Hip extension    Hip abduction    Hip adduction    Hip internal rotation    Hip external rotation    Knee flexion 4+/5 4-/5  Knee extension 5/5 4+/5  Ankle dorsiflexion 4-/5 3/5  Ankle plantarflexion    Ankle inversion    Ankle eversion     (Blank rows = not tested)  FUNCTIONAL TESTS:  5 times sit to stand: 17.30 seconds without UE support; required no assistance with first  4 repetitions, but required maxA for safety due to a LOB on the last repetition  Timed up and go (TUG): 11.44 seconds Stand to sit: uncontrolled descent with poor eccentric quadriceps control  BALANCE:   Narrow BOS, eyes open, firm surface: 30 seconds   Narrow BOS, eyes closed, firm surface: 30 seconds   Tandem (tested with both feet leading), eyes open, firm surface: <1 second  GAIT: Assistive device utilized: None Level of assistance: SBA Comments: left hip IR with decreased foot clearance and stride length bilaterally   TODAY'S TREATMENT:  DATE:                                                                 10-8- 2024                                    EXERCISE LOG  Exercise Repetitions and Resistance Comments  Nustep X 15 mins L3   Sit to stand 2x5 with UE assist    Knee ext 20# 3 x15-20      Knee flexion 40# 3x 15-20   Rocker board  PF/DF/ Balance   Airex pad tandem stance  X 4 with  each  foot in back     Blank cell = exercise not performed today Nustep  PATIENT EDUCATION:  Education details: Plan of care, healing, prognosis, using an assistive device for community ambulation for safety, objective findings, and goals for therapy Person educated: Patient Education method: Explanation Education comprehension: verbalized understanding  HOME EXERCISE PROGRAM:   ASSESSMENT:  CLINICAL IMPRESSION: Pt arrived doing fair, but with complaints of weakness and balance deficits. Rx focused on LE strengthening as well as balance act,'s. SBA with Pt in clinic and  CGA at times during balance act's    OBJECTIVE IMPAIRMENTS: Abnormal gait, decreased activity tolerance, decreased balance, decreased mobility, difficulty walking, decreased strength, postural dysfunction, and pain.   ACTIVITY LIMITATIONS: carrying, lifting, standing, squatting, stairs,  transfers, and locomotion level  PARTICIPATION LIMITATIONS: meal prep, cleaning, laundry, shopping, community activity, and yard work  PERSONAL FACTORS: Age, Past/current experiences, Time since onset of injury/illness/exacerbation, and 3+ comorbidities: Orthostatic hypotension, PVC's, asthma, diabetes type 2, depression, and arthritis  are also affecting patient's functional outcome.   REHAB POTENTIAL: Fair    CLINICAL DECISION MAKING: Evolving/moderate complexity  EVALUATION COMPLEXITY: Moderate   GOALS: Goals reviewed with patient? Yes  SHORT TERM GOALS: Target date: 01/03/23 Patient will be independent with his initial HEP.  Baseline: Goal status: INITIAL  2.  Patient will be able to safely transfer from sitting to standing with no external support for improved independence.  Baseline:  Goal status: INITIAL  LONG TERM GOALS: Target date: 01/24/23  Patient will be independent with his advanced HEP.  Baseline:  Goal status: INITIAL  2.  Patient will improve his five time sit to stand time to 12 seconds or less to reduce his fall risk.  Baseline:  Goal status: INITIAL  3.  Patient will report being able to walk around his driveway with minimal to no difficulty.  Baseline:  Goal status: INITIAL  4.  Patient will improve his stability in tandem stance to five seconds or greater bilaterally for improve safety in a narrow base of support.  Baseline:  Goal status: INITIAL  PLAN:  PT FREQUENCY: 2x/week  PT DURATION: 6 weeks  PLANNED INTERVENTIONS: Therapeutic exercises, Therapeutic activity, Neuromuscular re-education, Balance training, Gait training, Patient/Family education, Self Care, Joint mobilization, Stair training, Cryotherapy, Moist heat, Manual therapy, and Re-evaluation  PLAN FOR NEXT SESSION: NuStep, lower extremity strengthening, and balance interventions   Jayquon Theiler,CHRIS, PTA 12/20/2022, 4:36 PM

## 2022-12-22 ENCOUNTER — Ambulatory Visit: Payer: Medicare Other

## 2022-12-22 DIAGNOSIS — R29898 Other symptoms and signs involving the musculoskeletal system: Secondary | ICD-10-CM | POA: Diagnosis not present

## 2022-12-22 DIAGNOSIS — M489 Spondylopathy, unspecified: Secondary | ICD-10-CM | POA: Diagnosis not present

## 2022-12-22 DIAGNOSIS — Z9181 History of falling: Secondary | ICD-10-CM | POA: Diagnosis not present

## 2022-12-22 DIAGNOSIS — M6281 Muscle weakness (generalized): Secondary | ICD-10-CM | POA: Diagnosis not present

## 2022-12-22 DIAGNOSIS — R2689 Other abnormalities of gait and mobility: Secondary | ICD-10-CM | POA: Diagnosis not present

## 2022-12-22 NOTE — Therapy (Signed)
OUTPATIENT PHYSICAL THERAPY LOWER EXTREMITY TREATMENT   Patient Name: [name]. MRN: [id number] DOB:04/04/1945, 77 y.o., male Today's Date: 12/22/2022  END OF SESSION:  PT End of Session - 12/22/22 1021     Visit Number 3    Number of Visits 12    Date for PT Re-Evaluation 03/10/23    PT Start Time 1015    PT Stop Time 1057    PT Time Calculation (min) 42 min    Activity Tolerance Patient tolerated treatment well    Behavior During Therapy WFL for tasks assessed/performed              Past Medical History:  Diagnosis Date   Arthritis    Asthma    anxiety, tree/grass pollen   BPH (benign prostatic hyperplasia)    Bronchitis    hx of   Bulge of cervical disc without myelopathy    Chest pain    a. 06/2015 MV: EF 56%, no ischemia/infarct.   Complication of anesthesia 04/01/2013   no complications but just says he typically needs "more than expected" to anesthetize; needs CPAP (setting 10) in recovery   Depression    ADD   Insomnia    Low blood pressure    Mitral valve prolapse    a. 06/2015 Echo: EF 65-70%, mild LVH, no rwma, mild MVP of ant leaflet and mild to mod posteriorly directed MR, mildly dil La.   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   JOINT REPLACEMENT     Left, Right Knee, Right hip   KNEE ARTHROSCOPY     right x 3-4, left x 2   KNEE ARTHROTOMY  ~1991   right   LUMBAR FUSION  04/03/2013   Dr Yetta Barre, L5-S1   POLYPECTOMY     vocal cords 1992   REVERSE SHOULDER ARTHROPLASTY Right 09/27/2021   Procedure: REVERSE SHOULDER ARTHROPLASTY;  Surgeon:  Beverely Low, MD;  Location: [facility];  Service: Orthopedics;  Laterality: Right;  with ISB   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   Procedure: LEFT TOTAL HIP ARTHROPLASTY ANTERIOR APPROACH;  Surgeon: Durene Romans, MD;  Location: [facility];  Service: Orthopedics;  Laterality: Left;   TOTAL HIP REVISION  04/22/2011   Procedure: TOTAL HIP REVISION;  Surgeon: Loanne Drilling, MD;  Location: [facility];  Service: Orthopedics;  Laterality: Right;   Patient Active Problem List   Diagnosis Date Noted   S/P shoulder replacement, right 09/27/2021   Pre-operative respiratory examination 11/13/2019   Ambulatory dysfunction 07/24/2019   Closed nondisplaced fracture of condyle of right femur (HCC) 07/24/2019   Bulge of cervical disc without myelopathy    Falls 07/23/2019   Cough 02/20/2019   Absolute anemia 02/04/2019   Iron deficiency anemia 02/04/2019   Abnormal findings on diagnostic imaging of lung 11/27/2018   ILD (interstitial lung disease) (HCC) 11/27/2018   Mild obesity 08/05/2017   Type 2 diabetes mellitus treated without insulin (HCC) 08/05/2017   MVP (mitral valve prolapse)  08/05/2017   Orthostatic hypotension 01/18/2017   Upper airway cough syndrome 11/23/2016   Cough variant asthma 11/23/2016   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 11/12/2015   Left ventricular hypertrophy 10/03/2015   Mixed hyperlipidemia 10/03/2015   PVCs (premature ventricular contractions) 07/13/2015   Obesity (BMI 30.0-34.9) 07/13/2015   Chest pain 06/29/2015   Uncontrolled diabetes mellitus without complication, without long-term current use of insulin 06/29/2015   Depression 06/29/2015   BPH (benign prostatic hyperplasia) 06/29/2015   OSA on CPAP 12/26/2006   REFERRING PROVIDER: Antony Madura, MD   REFERRING DIAG: Imbalance; Cervical spine disease; Left leg weakness   THERAPY DIAG:  History of falling  Muscle weakness (generalized)  Rationale for Evaluation and Treatment: Rehabilitation  ONSET DATE:  September 2022  SUBJECTIVE:   SUBJECTIVE STATEMENT: Patient reports that he was sore after his last appointment.   PERTINENT HISTORY: Orthostatic hypotension, PVC's, asthma, diabetes type 2, depression, and arthritis PAIN:  Are you having pain? Yes: NPRS scale: no pain score provided/10 Pain location: low back and both hips (L>R) Pain description: intermittent sharp pain "like an electric shock" Aggravating factors: none known Relieving factors: "getting off my feet"   PRECAUTIONS: Fall  RED FLAGS: None   WEIGHT BEARING RESTRICTIONS: No  FALLS:  Has patient fallen in last 6 months? Yes. Number of falls 2; tripped going up a ramp and then the last fall he got his legs crossed up  LIVING ENVIRONMENT: Lives with: lives with their spouse Lives in: House/apartment Stairs: Yes: External: 1 steps; none Has following equipment at home: Single point cane and Walker - 2 wheeled  OCCUPATION: retired   PLOF: Independent  PATIENT GOALS: improved strength and balance so he can walk more to be able to take outdoor pictures, and be able to walk around his driveway safely (about 0.25 miles around)   NEXT MD VISIT: 05/24/23  OBJECTIVE:  Note: Objective measures were completed at Evaluation unless otherwise noted.  COGNITION: Overall cognitive status: Within functional limits for tasks assessed     SENSATION: Light touch: WFL and no significant deficits bilaterally Patient reports numbness radiating down his left leg into his toes.  EDEMA:  No edema observed  POSTURE: rounded shoulders, forward head, and flexed trunk   LOWER EXTREMITY ROM: WFL for activities assessed  LOWER EXTREMITY MMT:  MMT Right eval Left eval  Hip flexion 4-/5 3+/5  Hip extension    Hip abduction    Hip adduction    Hip internal rotation    Hip external rotation    Knee flexion 4+/5 4-/5  Knee extension 5/5 4+/5  Ankle dorsiflexion 4-/5 3/5  Ankle plantarflexion    Ankle inversion    Ankle  eversion     (Blank rows = not tested)  FUNCTIONAL TESTS:  5 times sit to stand: 17.30 seconds without UE support; required no assistance with first 4 repetitions, but required maxA for safety due to a LOB on the last repetition  Timed up and go (TUG): 11.44 seconds Stand to sit: uncontrolled descent with poor eccentric quadriceps control  BALANCE:   Narrow BOS, eyes open, firm surface: 30 seconds   Narrow BOS, eyes closed, firm surface: 30 seconds   Tandem (tested with both feet leading), eyes open, firm surface: <1 second  GAIT: Assistive device utilized: None Level of assistance: SBA Comments: left hip IR with decreased foot clearance and stride length bilaterally   TODAY'S TREATMENT:  DATE:                                                                                                   12/22/22 EXERCISE LOG  Exercise Repetitions and Resistance Comments  Nustep  L4 x 15 minutes   Cybex knee extension  20# x 3 x 20 reps    Cybex knee flexion  40# x 3 x 20 reps    Rocker board (static stance) 4 minutes  Intermittent UE support  Marching on foam  3 minutes  Intermittent UE support   Step up  6" step x 3 minutes  Intermittent UE support; alternating LE   Sit to stand  2 x 6 reps  Without UE support   Side stepping on foam  2 minutes  Intermittent UE support    Blank cell = exercise not performed today                                     10-8- 2024 EXERCISE LOG  Exercise Repetitions and Resistance Comments  Nustep X 15 mins L3   Sit to stand 2x5 with UE assist    Knee ext 20# 3 x15-20      Knee flexion 40# 3x 15-20   Rocker board  PF/DF/ Balance   Airex pad tandem stance  X 4 with  each  foot in back     Blank cell = exercise not performed today Nustep  PATIENT EDUCATION:  Education details: Plan of care, healing, prognosis, using an assistive  device for community ambulation for safety, objective findings, and goals for therapy Person educated: Patient Education method: Explanation Education comprehension: verbalized understanding  HOME EXERCISE PROGRAM:   ASSESSMENT:  CLINICAL IMPRESSION: Patient was progressed with multiple new interventions for improved lower extremity stability. He required minimal cueing with today's resisted interventions for proper pacing to facilitate improved lower extremity strengthening. Fatigue was his primary limitation as he required brief rest breaks throughout treatment.  He reported feeling tired upon the conclusion of treatment. He continues to require skilled physical therapy to address his remaining impairments to maximize his safety and functional mobility.   OBJECTIVE IMPAIRMENTS: Abnormal gait, decreased activity tolerance, decreased balance, decreased mobility, difficulty walking, decreased strength, postural dysfunction, and pain.   ACTIVITY LIMITATIONS: carrying, lifting, standing, squatting, stairs, transfers, and locomotion level  PARTICIPATION LIMITATIONS: meal prep, cleaning, laundry, shopping, community activity, and yard work  PERSONAL FACTORS: Age, Past/current experiences, Time since onset of injury/illness/exacerbation, and 3+ comorbidities: Orthostatic hypotension, PVC's, asthma, diabetes type 2, depression, and arthritis  are also affecting patient's functional outcome.   REHAB POTENTIAL: Fair    CLINICAL DECISION MAKING: Evolving/moderate complexity  EVALUATION COMPLEXITY: Moderate   GOALS: Goals reviewed with patient? Yes  SHORT TERM GOALS: Target date: 01/03/23 Patient will be independent with his initial HEP.  Baseline: Goal status: INITIAL  2.  Patient will be able to safely transfer from sitting to standing with no external support for improved independence.  Baseline:  Goal status: INITIAL  LONG TERM GOALS: Target date: 01/24/23  Patient will be  independent with his advanced HEP.  Baseline:  Goal status: INITIAL  2.  Patient will improve his five time sit to stand time to 12 seconds or less to reduce his fall risk.  Baseline:  Goal status: INITIAL  3.  Patient will report being able to walk around his driveway with minimal to no difficulty.  Baseline:  Goal status: INITIAL  4.  Patient will improve his stability in tandem stance to five seconds or greater bilaterally for improve safety in a narrow base of support.  Baseline:  Goal status: INITIAL  PLAN:  PT FREQUENCY: 2x/week  PT DURATION: 6 weeks  PLANNED INTERVENTIONS: Therapeutic exercises, Therapeutic activity, Neuromuscular re-education, Balance training, Gait training, Patient/Family education, Self Care, Joint mobilization, Stair training, Cryotherapy, Moist heat, Manual therapy, and Re-evaluation  PLAN FOR NEXT SESSION: NuStep, lower extremity strengthening, and balance interventions   Granville Lewis, PT 12/22/2022, 12:48 PM

## 2022-12-27 ENCOUNTER — Ambulatory Visit: Payer: Medicare Other | Admitting: *Deleted

## 2022-12-27 ENCOUNTER — Encounter: Payer: Self-pay | Admitting: *Deleted

## 2022-12-27 DIAGNOSIS — R2689 Other abnormalities of gait and mobility: Secondary | ICD-10-CM | POA: Diagnosis not present

## 2022-12-27 DIAGNOSIS — M6281 Muscle weakness (generalized): Secondary | ICD-10-CM

## 2022-12-27 DIAGNOSIS — Z9181 History of falling: Secondary | ICD-10-CM | POA: Diagnosis not present

## 2022-12-27 DIAGNOSIS — R29898 Other symptoms and signs involving the musculoskeletal system: Secondary | ICD-10-CM | POA: Diagnosis not present

## 2022-12-27 DIAGNOSIS — M489 Spondylopathy, unspecified: Secondary | ICD-10-CM | POA: Diagnosis not present

## 2022-12-27 NOTE — Therapy (Signed)
OUTPATIENT PHYSICAL THERAPY LOWER EXTREMITY TREATMENT   Patient Name: [name]. MRN: [id number] DOB:December 22, 1945, 77 y.o., male Today's Date: 12/27/2022  END OF SESSION:  PT End of Session - 12/27/22 1019     Visit Number 4    Number of Visits 12    Date for PT Re-Evaluation 03/10/23    PT Start Time 1018    PT Stop Time 1102    PT Time Calculation (min) 44 min              Past Medical History:  Diagnosis Date   Arthritis    Asthma    anxiety, tree/grass pollen   BPH (benign prostatic hyperplasia)    Bronchitis    hx of   Bulge of cervical disc without myelopathy    Chest pain    a. 06/2015 MV: EF 56%, no ischemia/infarct.   Complication of anesthesia 04/01/2013   no complications but just says he typically needs "more than expected" to anesthetize; needs CPAP (setting 10) in recovery   Depression    ADD   Insomnia    Low blood pressure    Mitral valve prolapse    a. 06/2015 Echo: EF 65-70%, mild LVH, no rwma, mild MVP of ant leaflet and mild to mod posteriorly directed MR, mildly dil La.   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   JOINT REPLACEMENT     Left, Right Knee, Right hip   KNEE ARTHROSCOPY     right x 3-4, left x 2   KNEE ARTHROTOMY  ~1991   right   LUMBAR FUSION  04/03/2013   Dr Yetta Barre, L5-S1   POLYPECTOMY     vocal cords 1992   REVERSE SHOULDER ARTHROPLASTY Right 09/27/2021   Procedure: REVERSE SHOULDER ARTHROPLASTY;  Surgeon: Beverely Low, MD;  Location: [facility];  Service: Orthopedics;  Laterality: Right;  with ISB   TOTAL HIP ARTHROPLASTY Left  10/21/2014   Procedure: LEFT TOTAL HIP ARTHROPLASTY ANTERIOR APPROACH;  Surgeon: Durene Romans, MD;  Location: [facility];  Service: Orthopedics;  Laterality: Left;   TOTAL HIP REVISION  04/22/2011   Procedure: TOTAL HIP REVISION;  Surgeon: Loanne Drilling, MD;  Location: [facility];  Service: Orthopedics;  Laterality: Right;   Patient Active Problem List   Diagnosis Date Noted   S/P shoulder replacement, right 09/27/2021   Pre-operative respiratory examination 11/13/2019   Ambulatory dysfunction 07/24/2019   Closed nondisplaced fracture of condyle of right femur (HCC) 07/24/2019   Bulge of cervical disc without myelopathy    Falls 07/23/2019   Cough 02/20/2019   Absolute anemia 02/04/2019   Iron deficiency anemia 02/04/2019   Abnormal findings on diagnostic imaging of lung 11/27/2018   ILD (interstitial lung disease) (HCC) 11/27/2018   Mild obesity 08/05/2017   Type 2 diabetes mellitus treated without insulin (HCC) 08/05/2017   MVP (mitral valve prolapse) 08/05/2017   Orthostatic hypotension 01/18/2017   Upper airway cough syndrome 11/23/2016   Cough variant asthma 11/23/2016  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 11/12/2015   Left ventricular hypertrophy 10/03/2015   Mixed hyperlipidemia 10/03/2015   PVCs (premature ventricular contractions) 07/13/2015   Obesity (BMI 30.0-34.9) 07/13/2015   Chest pain 06/29/2015   Uncontrolled diabetes mellitus without complication, without long-term current use of insulin 06/29/2015   Depression 06/29/2015   BPH (benign prostatic hyperplasia) 06/29/2015   OSA on CPAP 12/26/2006   REFERRING PROVIDER: Antony Madura, MD   REFERRING DIAG: Imbalance; Cervical spine disease; Left leg weakness   THERAPY DIAG:  History of falling  Muscle weakness (generalized)  Rationale for Evaluation and Treatment: Rehabilitation  ONSET DATE: September 2022  SUBJECTIVE:   SUBJECTIVE STATEMENT: Patient reports that he was sore after last visit and stiff in legs  today.  PERTINENT HISTORY: Orthostatic hypotension, PVC's, asthma, diabetes type 2, depression, and arthritis PAIN:  Are you having pain? Yes: NPRS scale: no pain score provided/10 Pain location: low back and both hips (L>R) Pain description: intermittent sharp pain "like an electric shock" Aggravating factors: none known Relieving factors: "getting off my feet"   PRECAUTIONS: Fall  RED FLAGS: None   WEIGHT BEARING RESTRICTIONS: No  FALLS:  Has patient fallen in last 6 months? Yes. Number of falls 2; tripped going up a ramp and then the last fall he got his legs crossed up  LIVING ENVIRONMENT: Lives with: lives with their spouse Lives in: House/apartment Stairs: Yes: External: 1 steps; none Has following equipment at home: Single point cane and Walker - 2 wheeled  OCCUPATION: retired   PLOF: Independent  PATIENT GOALS: improved strength and balance so he can walk more to be able to take outdoor pictures, and be able to walk around his driveway safely (about 0.25 miles around)   NEXT MD VISIT: 05/24/23  OBJECTIVE:  Note: Objective measures were completed at Evaluation unless otherwise noted.  COGNITION: Overall cognitive status: Within functional limits for tasks assessed     SENSATION: Light touch: WFL and no significant deficits bilaterally Patient reports numbness radiating down his left leg into his toes.  EDEMA:  No edema observed  POSTURE: rounded shoulders, forward head, and flexed trunk   LOWER EXTREMITY ROM: WFL for activities assessed  LOWER EXTREMITY MMT:  MMT Right eval Left eval  Hip flexion 4-/5 3+/5  Hip extension    Hip abduction    Hip adduction    Hip internal rotation    Hip external rotation    Knee flexion 4+/5 4-/5  Knee extension 5/5 4+/5  Ankle dorsiflexion 4-/5 3/5  Ankle plantarflexion    Ankle inversion    Ankle eversion     (Blank rows = not tested)  FUNCTIONAL TESTS:  5 times sit to stand: 17.30 seconds without UE  support; required no assistance with first 4 repetitions, but required maxA for safety due to a LOB on the last repetition  Timed up and go (TUG): 11.44 seconds Stand to sit: uncontrolled descent with poor eccentric quadriceps control  BALANCE:   Narrow BOS, eyes open, firm surface: 30 seconds   Narrow BOS, eyes closed, firm surface: 30 seconds   Tandem (tested with both feet leading), eyes open, firm surface: <1 second  GAIT: Assistive device utilized: None Level of assistance: SBA Comments: left hip IR with decreased foot clearance and stride length bilaterally   TODAY'S TREATMENT:  DATE:                                                                                                   12/27/22                                                       EXERCISE LOG  Exercise Repetitions and Resistance Comments  Nustep  L4 x 15 minutes   Cybex knee extension  20# x 3 mins   Cybex knee flexion  40# x   3 mins   Rocker board (static stance) 5 minutes  Intermittent UE support  Tandem stance on foam  5 minutes  Intermittent UE support   Step up   Intermittent UE support; alternating LE   Sit to stand  2 x 5 reps  from chair  UE support   Side stepping on foam  2 minutes  Intermittent UE support    Blank cell = exercise not performed today                                     10-8- 2024 EXERCISE LOG  Exercise Repetitions and Resistance Comments  Nustep X 15 mins L3   Sit to stand 2x5 with UE assist    Knee ext 20# 3 x15-20      Knee flexion 40# 3x 15-20   Rocker board  PF/DF/ Balance   Airex pad tandem stance  X 4 with  each  foot in back     Blank cell = exercise not performed today Nustep  PATIENT EDUCATION:  Education details: Plan of care, healing, prognosis, using an assistive device for community ambulation for safety, objective findings, and goals for  therapy Person educated: Patient Education method: Explanation Education comprehension: verbalized understanding  HOME EXERCISE PROGRAM:   ASSESSMENT:  CLINICAL IMPRESSION: Patient arrived today reporting Bil. LE pain/ stiffness. He was able to continue with LE strengthening exs as well as balance act.'s. Pt did better with balance act's today with decreased UE assist, but needed UE assist with sit to stand today.      OBJECTIVE IMPAIRMENTS: Abnormal gait, decreased activity tolerance, decreased balance, decreased mobility, difficulty walking, decreased strength, postural dysfunction, and pain.   ACTIVITY LIMITATIONS: carrying, lifting, standing, squatting, stairs, transfers, and locomotion level  PARTICIPATION LIMITATIONS: meal prep, cleaning, laundry, shopping, community activity, and yard work  PERSONAL FACTORS: Age, Past/current experiences, Time since onset of injury/illness/exacerbation, and 3+ comorbidities: Orthostatic hypotension, PVC's, asthma, diabetes type 2, depression, and arthritis  are also affecting patient's functional outcome.   REHAB POTENTIAL: Fair    CLINICAL DECISION MAKING: Evolving/moderate complexity  EVALUATION COMPLEXITY: Moderate   GOALS: Goals reviewed with patient? Yes  SHORT TERM GOALS: Target date: 01/03/23 Patient will be independent with his initial HEP.  Baseline: Goal status: INITIAL  2.  Patient will be  able to safely transfer from sitting to standing with no external support for improved independence.  Baseline:  Goal status: INITIAL  LONG TERM GOALS: Target date: 01/24/23  Patient will be independent with his advanced HEP.  Baseline:  Goal status: INITIAL  2.  Patient will improve his five time sit to stand time to 12 seconds or less to reduce his fall risk.  Baseline:  Goal status: INITIAL  3.  Patient will report being able to walk around his driveway with minimal to no difficulty.  Baseline:  Goal status: INITIAL  4.   Patient will improve his stability in tandem stance to five seconds or greater bilaterally for improve safety in a narrow base of support.  Baseline:  Goal status: INITIAL  PLAN:  PT FREQUENCY: 2x/week  PT DURATION: 6 weeks  PLANNED INTERVENTIONS: Therapeutic exercises, Therapeutic activity, Neuromuscular re-education, Balance training, Gait training, Patient/Family education, Self Care, Joint mobilization, Stair training, Cryotherapy, Moist heat, Manual therapy, and Re-evaluation  PLAN FOR NEXT SESSION: NuStep, lower extremity strengthening, and balance interventions   Taras Rask,CHRIS, PTA 12/27/2022, 12:03 PM

## 2023-01-01 DIAGNOSIS — Z23 Encounter for immunization: Secondary | ICD-10-CM | POA: Diagnosis not present

## 2023-01-03 ENCOUNTER — Ambulatory Visit: Payer: Medicare Other

## 2023-01-03 DIAGNOSIS — Z9181 History of falling: Secondary | ICD-10-CM

## 2023-01-03 DIAGNOSIS — M489 Spondylopathy, unspecified: Secondary | ICD-10-CM | POA: Diagnosis not present

## 2023-01-03 DIAGNOSIS — M6281 Muscle weakness (generalized): Secondary | ICD-10-CM

## 2023-01-03 DIAGNOSIS — R2689 Other abnormalities of gait and mobility: Secondary | ICD-10-CM | POA: Diagnosis not present

## 2023-01-03 DIAGNOSIS — R29898 Other symptoms and signs involving the musculoskeletal system: Secondary | ICD-10-CM | POA: Diagnosis not present

## 2023-01-03 NOTE — Therapy (Signed)
OUTPATIENT PHYSICAL THERAPY LOWER EXTREMITY TREATMENT   Patient Name: [name]. MRN: [id number] DOB:September 24, 1945, 77 y.o., male Today's Date: 01/03/2023  END OF SESSION:  PT End of Session - 01/03/23 1305     Visit Number 5    Number of Visits 12    Date for PT Re-Evaluation 03/10/23    PT Start Time 1303    PT Stop Time 1342    PT Time Calculation (min) 39 min    Activity Tolerance Patient tolerated treatment well    Behavior During Therapy WFL for tasks assessed/performed               Past Medical History:  Diagnosis Date   Arthritis    Asthma    anxiety, tree/grass pollen   BPH (benign prostatic hyperplasia)    Bronchitis    hx of   Bulge of cervical disc without myelopathy    Chest pain    a. 06/2015 MV: EF 56%, no ischemia/infarct.   Complication of anesthesia 04/01/2013   no complications but just says he typically needs "more than expected" to anesthetize; needs CPAP (setting 10) in recovery   Depression    ADD   Insomnia    Low blood pressure    Mitral valve prolapse    a. 06/2015 Echo: EF 65-70%, mild LVH, no rwma, mild MVP of ant leaflet and mild to mod posteriorly directed MR, mildly dil La.   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   JOINT REPLACEMENT     Left, Right Knee, Right hip   KNEE ARTHROSCOPY     right x 3-4, left x 2   KNEE ARTHROTOMY  ~1991   right   LUMBAR FUSION  04/03/2013   Dr Yetta Barre, L5-S1   POLYPECTOMY     vocal cords 1992   REVERSE SHOULDER ARTHROPLASTY Right 09/27/2021   Procedure: REVERSE SHOULDER ARTHROPLASTY;  Surgeon:  Beverely Low, MD;  Location: [facility];  Service: Orthopedics;  Laterality: Right;  with ISB   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   Procedure: LEFT TOTAL HIP ARTHROPLASTY ANTERIOR APPROACH;  Surgeon: Durene Romans, MD;  Location: [facility];  Service: Orthopedics;  Laterality: Left;   TOTAL HIP REVISION  04/22/2011   Procedure: TOTAL HIP REVISION;  Surgeon: Loanne Drilling, MD;  Location: [facility];  Service: Orthopedics;  Laterality: Right;   Patient Active Problem List   Diagnosis Date Noted   S/P shoulder replacement, right 09/27/2021   Pre-operative respiratory examination 11/13/2019   Ambulatory dysfunction 07/24/2019   Closed nondisplaced fracture of condyle of right femur (HCC) 07/24/2019   Bulge of cervical disc without myelopathy    Falls 07/23/2019   Cough 02/20/2019   Absolute anemia 02/04/2019   Iron deficiency anemia 02/04/2019   Abnormal findings on diagnostic imaging of lung 11/27/2018   ILD (interstitial lung disease) (HCC) 11/27/2018   Mild obesity 08/05/2017   Type 2 diabetes mellitus treated without insulin (HCC) 08/05/2017   MVP (mitral valve  prolapse) 08/05/2017   Orthostatic hypotension 01/18/2017   Upper airway cough syndrome 11/23/2016   Cough variant asthma 11/23/2016   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 11/12/2015   Left ventricular hypertrophy 10/03/2015   Mixed hyperlipidemia 10/03/2015   PVCs (premature ventricular contractions) 07/13/2015   Obesity (BMI 30.0-34.9) 07/13/2015   Chest pain 06/29/2015   Uncontrolled diabetes mellitus without complication, without long-term current use of insulin 06/29/2015   Depression 06/29/2015   BPH (benign prostatic hyperplasia) 06/29/2015   OSA on CPAP 12/26/2006   REFERRING PROVIDER: Antony Madura, MD   REFERRING DIAG: Imbalance; Cervical spine disease; Left leg weakness   THERAPY DIAG:  History of falling  Muscle weakness (generalized)  Rationale for Evaluation and Treatment: Rehabilitation  ONSET DATE:  September 2022  SUBJECTIVE:   SUBJECTIVE STATEMENT: Patient reports that he fell out of bed last week while he was sleeping. He notes that he fell on his right side, but he feels alright today.   PERTINENT HISTORY: Orthostatic hypotension, PVC's, asthma, diabetes type 2, depression, and arthritis PAIN:  Are you having pain? Yes: NPRS scale: no pain score provided/10 Pain location: low back and both hips (L>R) Pain description: intermittent sharp pain "like an electric shock" Aggravating factors: none known Relieving factors: "getting off my feet"   PRECAUTIONS: Fall  RED FLAGS: None   WEIGHT BEARING RESTRICTIONS: No  FALLS:  Has patient fallen in last 6 months? Yes. Number of falls 2; tripped going up a ramp and then the last fall he got his legs crossed up  LIVING ENVIRONMENT: Lives with: lives with their spouse Lives in: House/apartment Stairs: Yes: External: 1 steps; none Has following equipment at home: Single point cane and Walker - 2 wheeled  OCCUPATION: retired   PLOF: Independent  PATIENT GOALS: improved strength and balance so he can walk more to be able to take outdoor pictures, and be able to walk around his driveway safely (about 0.25 miles around)   NEXT MD VISIT: 05/24/23  OBJECTIVE:  Note: Objective measures were completed at Evaluation unless otherwise noted.  COGNITION: Overall cognitive status: Within functional limits for tasks assessed     SENSATION: Light touch: WFL and no significant deficits bilaterally Patient reports numbness radiating down his left leg into his toes.  EDEMA:  No edema observed  POSTURE: rounded shoulders, forward head, and flexed trunk   LOWER EXTREMITY ROM: WFL for activities assessed  LOWER EXTREMITY MMT:  MMT Right eval Left eval  Hip flexion 4-/5 3+/5  Hip extension    Hip abduction    Hip adduction    Hip internal rotation    Hip external rotation    Knee flexion 4+/5 4-/5  Knee extension 5/5 4+/5   Ankle dorsiflexion 4-/5 3/5  Ankle plantarflexion    Ankle inversion    Ankle eversion     (Blank rows = not tested)  FUNCTIONAL TESTS:  5 times sit to stand: 17.30 seconds without UE support; required no assistance with first 4 repetitions, but required maxA for safety due to a LOB on the last repetition  Timed up and go (TUG): 11.44 seconds Stand to sit: uncontrolled descent with poor eccentric quadriceps control  BALANCE:   Narrow BOS, eyes open, firm surface: 30 seconds   Narrow BOS, eyes closed, firm surface: 30 seconds   Tandem (tested with both feet leading), eyes open, firm surface: <1 second  GAIT: Assistive device utilized: None Level of assistance: SBA Comments: left hip IR with decreased foot clearance  and stride length bilaterally   TODAY'S TREATMENT:                                                                                                                              DATE:                                                                                                   01/03/23 EXERCISE LOG  Exercise Repetitions and Resistance Comments  NuStep  L3-5 x 15 minutes    Rocker board  5 minutes BUE support   Cybex knee flexion  40# x 3 minutes    Cybex knee extension 20# x 3 minutes    Side stepping on foam 2.5 minutes  Intermittent UE support   Sit to stand  2 x 6 reps  Without UE support    Blank cell = exercise not performed today                                    12/27/22    EXERCISE LOG  Exercise Repetitions and Resistance Comments  Nustep  L4 x 15 minutes   Cybex knee extension  20# x 3 mins   Cybex knee flexion  40# x   3 mins   Rocker board (static stance) 5 minutes  Intermittent UE support  Tandem stance on foam  5 minutes  Intermittent UE support   Step up   Intermittent UE support; alternating LE   Sit to stand  2 x 5 reps  from chair  UE support   Side stepping on foam  2 minutes  Intermittent UE support    Blank cell = exercise not  performed today                                     10-8- 2024 EXERCISE LOG  Exercise Repetitions and Resistance Comments  Nustep X 15 mins L3   Sit to stand 2x5 with UE assist    Knee ext 20# 3 x15-20      Knee flexion 40# 3x 15-20   Rocker board  PF/DF/ Balance   Airex pad tandem stance  X 4 with  each  foot in back     Blank cell = exercise not performed today Nustep  PATIENT EDUCATION:  Education details: Plan of care, healing, prognosis, using an assistive device for community ambulation for safety,  objective findings, and goals for therapy Person educated: Patient Education method: Explanation Education comprehension: verbalized understanding  HOME EXERCISE PROGRAM:   ASSESSMENT:  CLINICAL IMPRESSION: Today's treatment focused on familiar interventions for improved lower extremity strength and stability on unstable terrain for improved function with his daily activities. He required minimal cueing with proper pacing for proper exercise performance to avoid compensatory movement patterns. He experienced no increase in pain or discomfort with any of today's interventions. He reported feeling tired upon the conclusion of treatment. He continues to require skilled physical therapy to address his remaining impairments to maximize his safety and functional mobility.   OBJECTIVE IMPAIRMENTS: Abnormal gait, decreased activity tolerance, decreased balance, decreased mobility, difficulty walking, decreased strength, postural dysfunction, and pain.   ACTIVITY LIMITATIONS: carrying, lifting, standing, squatting, stairs, transfers, and locomotion level  PARTICIPATION LIMITATIONS: meal prep, cleaning, laundry, shopping, community activity, and yard work  PERSONAL FACTORS: Age, Past/current experiences, Time since onset of injury/illness/exacerbation, and 3+ comorbidities: Orthostatic hypotension, PVC's, asthma, diabetes type 2, depression, and arthritis  are also affecting patient's  functional outcome.   REHAB POTENTIAL: Fair    CLINICAL DECISION MAKING: Evolving/moderate complexity  EVALUATION COMPLEXITY: Moderate   GOALS: Goals reviewed with patient? Yes  SHORT TERM GOALS: Target date: 01/03/23 Patient will be independent with his initial HEP.  Baseline: Goal status: INITIAL  2.  Patient will be able to safely transfer from sitting to standing with no external support for improved independence.  Baseline:  Goal status: INITIAL  LONG TERM GOALS: Target date: 01/24/23  Patient will be independent with his advanced HEP.  Baseline:  Goal status: INITIAL  2.  Patient will improve his five time sit to stand time to 12 seconds or less to reduce his fall risk.  Baseline:  Goal status: INITIAL  3.  Patient will report being able to walk around his driveway with minimal to no difficulty.  Baseline:  Goal status: INITIAL  4.  Patient will improve his stability in tandem stance to five seconds or greater bilaterally for improve safety in a narrow base of support.  Baseline:  Goal status: INITIAL  PLAN:  PT FREQUENCY: 2x/week  PT DURATION: 6 weeks  PLANNED INTERVENTIONS: Therapeutic exercises, Therapeutic activity, Neuromuscular re-education, Balance training, Gait training, Patient/Family education, Self Care, Joint mobilization, Stair training, Cryotherapy, Moist heat, Manual therapy, and Re-evaluation  PLAN FOR NEXT SESSION: NuStep, lower extremity strengthening, and balance interventions   Granville Lewis, PT 01/03/2023, 1:45 PM

## 2023-01-10 ENCOUNTER — Encounter: Payer: Medicare Other | Admitting: *Deleted

## 2023-01-17 ENCOUNTER — Encounter: Payer: Medicare Other | Admitting: *Deleted

## 2023-01-25 DIAGNOSIS — Z79891 Long term (current) use of opiate analgesic: Secondary | ICD-10-CM | POA: Diagnosis not present

## 2023-01-25 DIAGNOSIS — Z79899 Other long term (current) drug therapy: Secondary | ICD-10-CM | POA: Diagnosis not present

## 2023-01-25 DIAGNOSIS — M4326 Fusion of spine, lumbar region: Secondary | ICD-10-CM | POA: Diagnosis not present

## 2023-01-25 DIAGNOSIS — Z6828 Body mass index (BMI) 28.0-28.9, adult: Secondary | ICD-10-CM | POA: Diagnosis not present

## 2023-01-25 DIAGNOSIS — G894 Chronic pain syndrome: Secondary | ICD-10-CM | POA: Diagnosis not present

## 2023-01-25 DIAGNOSIS — M21372 Foot drop, left foot: Secondary | ICD-10-CM | POA: Diagnosis not present

## 2023-02-08 DIAGNOSIS — N401 Enlarged prostate with lower urinary tract symptoms: Secondary | ICD-10-CM | POA: Diagnosis not present

## 2023-02-08 DIAGNOSIS — R351 Nocturia: Secondary | ICD-10-CM | POA: Diagnosis not present

## 2023-02-08 DIAGNOSIS — N3941 Urge incontinence: Secondary | ICD-10-CM | POA: Diagnosis not present

## 2023-02-08 DIAGNOSIS — N3020 Other chronic cystitis without hematuria: Secondary | ICD-10-CM | POA: Diagnosis not present

## 2023-02-14 DIAGNOSIS — F3130 Bipolar disorder, current episode depressed, mild or moderate severity, unspecified: Secondary | ICD-10-CM | POA: Diagnosis not present

## 2023-02-22 ENCOUNTER — Encounter: Payer: Self-pay | Admitting: Cardiovascular Disease

## 2023-02-22 ENCOUNTER — Ambulatory Visit: Payer: Medicare Other | Attending: Cardiovascular Disease | Admitting: Cardiovascular Disease

## 2023-02-22 VITALS — BP 110/76 | HR 66 | Ht 74.0 in | Wt 200.0 lb

## 2023-02-22 DIAGNOSIS — I700 Atherosclerosis of aorta: Secondary | ICD-10-CM | POA: Insufficient documentation

## 2023-02-22 DIAGNOSIS — E7800 Pure hypercholesterolemia, unspecified: Secondary | ICD-10-CM | POA: Diagnosis not present

## 2023-02-22 DIAGNOSIS — I517 Cardiomegaly: Secondary | ICD-10-CM

## 2023-02-22 DIAGNOSIS — I493 Ventricular premature depolarization: Secondary | ICD-10-CM | POA: Diagnosis not present

## 2023-02-22 DIAGNOSIS — I341 Nonrheumatic mitral (valve) prolapse: Secondary | ICD-10-CM | POA: Diagnosis not present

## 2023-02-22 DIAGNOSIS — I2510 Atherosclerotic heart disease of native coronary artery without angina pectoris: Secondary | ICD-10-CM | POA: Insufficient documentation

## 2023-02-22 DIAGNOSIS — D509 Iron deficiency anemia, unspecified: Secondary | ICD-10-CM | POA: Insufficient documentation

## 2023-02-22 NOTE — Progress Notes (Signed)
Cardiology office note   Date:  02/26/2023   ID:  Ronald Ply., DOB [birth year], MRN [id number]  PCP:  Georgann Housekeeper, MD  Cardiologist:  Malayla Granberry Electrophysiologist:  None   Evaluation Performed:  Follow-Up Visit  Chief Complaint: CAD, PVCs  History of Present Illness:    Ronald Romualdo. is a 77 y.o. male with known moderate CAD managed medically, mitral valve prolapse of the anterior leaflet with mild to moderate mitral insufficiency, diabetes mellitus, hyperlipidemia, symptomatic PVCs, pulmonary fibrosis, aortic atherosclerosis.  No longer on CPAP after weight loss.  He has mild cervical spine stenosis.  Started on midodrine for orthostatic hypotension by his neurologist Dr. Loleta Chance.  His activity is primarily limited by back pain but also by lack of desire to exercise.  He has a history of bilateral hip and bilateral knee replacements as well as previous lumbar spine surgery.  He has managed to keep off the excess weight and his BMI is around 25.  This has led to marked improvement in his sleep and he no longer uses CPAP.  He does not complain much of dizziness and has not had any syncope or near syncope.  Rarely has palpitations.  Denies orthopnea, PND or lower extremity edema.  Multiple PVCs were seen on previous EKGs, although not today.  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  The patient specifically denies any chest pain at rest exertion, dyspnea at rest or with exertion, orthopnea, paroxysmal nocturnal dyspnea, syncope, focal neurological deficits, intermittent claudication, lower extremity edema, unexplained weight gain, cough, hemoptysis or wheezing.  I do not have his most recent lipid profile, but his most recent hemoglobin A1c was borderline for diabetes mellitus again at 6.4%.  He thinks that his LDL is still in the 50s, per his recollection.  Cardiac catheterization in 2008 showed a 95% stenosis in a small  third diagonal artery branch, with mild plaque elsewhere.  He had a low risk nuclear stress test in April 2017.  Echo in 2017 showed LVEF 65-70% with mild mitral regurgitation.  He does not have congestive heart failure.    Past Medical History:  Diagnosis Date   Arthritis    Asthma    anxiety, tree/grass pollen   BPH (benign prostatic hyperplasia)    Bronchitis    hx of   Bulge of cervical disc without myelopathy    Chest pain    a. 06/2015 MV: EF 56%, no ischemia/infarct.   Complication of anesthesia 04/01/2013   no complications but just says he typically needs "more than expected" to anesthetize; needs CPAP (setting 10) in recovery   Depression    ADD   Insomnia    Low blood pressure    Mitral valve prolapse    a. 06/2015 Echo: EF 65-70%, mild LVH, no rwma, mild MVP of ant leaflet and mild to mod posteriorly directed MR, mildly dil La.   Obesity    Pre-diabetes    PVC's (premature ventricular contractions)    Recurrent upper respiratory infection (URI)    states Dr Lequita Halt aware- no fever- states is improving   Sleep apnea    no longer uses cpap due to weight loss   Tremor    d/t lithium   Past Surgical History:  Procedure Laterality Date   CARDIAC CATHETERIZATION     2008 pre op gastric bypass   COLONOSCOPY W/ POLYPECTOMY     GASTRIC BYPASS  2008   Roux en  y   JOINT REPLACEMENT     Left, Right Knee, Right hip   KNEE ARTHROSCOPY     right x 3-4, left x 2   KNEE ARTHROTOMY  ~1991   right   LUMBAR FUSION  04/03/2013   Dr Yetta Barre, L5-S1   POLYPECTOMY     vocal cords 1992   REVERSE SHOULDER ARTHROPLASTY Right 09/27/2021   Procedure: REVERSE SHOULDER ARTHROPLASTY;  Surgeon: Beverely Low, MD;  Location: [facility];  Service: Orthopedics;  Laterality: Right;  with ISB   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   Procedure: LEFT TOTAL HIP ARTHROPLASTY ANTERIOR APPROACH;  Surgeon: Durene Romans, MD;  Location: [facility];  Service: Orthopedics;  Laterality: Left;   TOTAL HIP REVISION   04/22/2011   Procedure: TOTAL HIP REVISION;  Surgeon: Loanne Drilling, MD;  Location: [facility];  Service: Orthopedics;  Laterality: Right;     Current Meds  Medication Sig   acetaminophen (TYLENOL) 325 MG tablet Take 650 mg by mouth every 6 (six) hours as needed.   albuterol (VENTOLIN HFA) 108 (90 Base) MCG/ACT inhaler TAKE 2 PUFFS BY MOUTH EVERY 6 HOURS AS NEEDED FOR WHEEZE OR SHORTNESS OF BREATH   alfuzosin (UROXATRAL) 10 MG 24 hr tablet Take 10 mg by mouth daily.   aspirin EC 81 MG tablet Take 81 mg by mouth 2 (two) times daily.   dutasteride (AVODART) 0.5 MG capsule Take 0.5 mg by mouth daily.   Eszopiclone 3 MG TABS Take 3 mg by mouth at bedtime.   finasteride (PROSCAR) 5 MG tablet Take 5 mg by mouth daily.   gabapentin (NEURONTIN) 400 MG capsule Take 300 mg by mouth 3 (three) times daily.   LORazepam (ATIVAN) 0.5 MG tablet Take 0.5 mg by mouth in the morning and at bedtime.   metFORMIN (GLUCOPHAGE) 500 MG tablet Take 500 mg by mouth 2 (two) times daily with a meal.    methocarbamol (ROBAXIN) 750 MG tablet Take 750 mg by mouth 3 (three) times daily.   methylphenidate 27 MG PO TB24 Take 27 mg by mouth daily.   midodrine (PROAMATINE) 2.5 MG tablet Take 2.5 mg by mouth 2 (two) times daily.   Multiple Vitamins-Minerals (CENTRUM SILVER ADULT 50+ PO) Take 1 tablet by mouth daily.   oxybutynin (DITROPAN) 5 MG tablet Take 5 mg by mouth at bedtime.   Oxycodone HCl 10 MG TABS Take 10 mg by mouth every 6 (six) hours as needed (pain).   pravastatin (PRAVACHOL) 40 MG tablet Take 40 mg by mouth daily.   sertraline (ZOLOFT) 100 MG tablet Take 100 mg by mouth every morning.   venlafaxine XR (EFFEXOR-XR) 75 MG 24 hr capsule Take 150 mg by mouth every morning.     Allergies:   Breo ellipta [fluticasone furoate-vilanterol], Demerol [meperidine], Lamotrigine, and Nsaids   Social History   Tobacco Use   Smoking status: Former    Current packs/day: 0.00    Average packs/day: 1.5 packs/day for 20.0 years  (30.0 ttl pk-yrs)    Types: Cigarettes    Start date: 04/17/1970    Quit date: 04/17/1990    Years since quitting: 32.8   Smokeless tobacco: Never  Vaping Use   Vaping status: Never Used  Substance Use Topics   Alcohol use: No   Drug use: No     Family Hx: The patient's family history includes Anxiety disorder in his mother; Asthma in his maternal grandmother; Heart disease in his father.  ROS:   Please see the history of  present illness.    All other systems are reviewed and are negative.   Prior CV studies:   The following studies were reviewed today:  Echocardiogram 2017 - Left ventricle: The cavity size was normal. Wall thickness was    increased in a pattern of mild LVH. Systolic function was    vigorous. The estimated ejection fraction was in the range of 65%    to 70%. Wall motion was normal; there were no regional wall    motion abnormalities. The study is not technically sufficient to    allow evaluation of LV diastolic function.  - Mitral valve: There was mild regurgitation.  - Left atrium: The atrium was mildly dilated.   High resolution chest CT November 05, 2019 1. 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 Stable mild patchy air trapping in both lungs. 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 Fibrotic NSIP is on the differential. Findings are suggestive of an alternative diagnosis (not UIP) per consensus guidelines: Diagnosis of Idiopathic Pulmonary Fibrosis: An Official ATS/ERS/JRS/ALAT Clinical Practice Guideline. Am Rosezetta Schlatter Crit Care Med Vol 198, Iss 5, [id number], Nov 12 2016. 2. Three-vessel coronary atherosclerosis. 3. Small pericardial effusion/thickening, slightly increased. 4. Aortic Atherosclerosis (ICD10-I70.0) and Emphysema (ICD10-J43.9).  Labs/Other Tests and Data Reviewed:     EKG: Personally reviewed the tracing from 06/28/2022 which shows sinus rhythm and bifascicular block (RBBB plus LAFB), borderline QTc 480 ms.  EKG Interpretation Date/Time:    Ventricular Rate:    PR Interval:    QRS Duration:    QT Interval:    QTC Calculation:   R Axis:      Text Interpretation:           Recent Labs:  BMET    Component Value Date/Time   NA 130 (L) 06/28/2022 1057   K 3.9 06/28/2022 1057   CL 102 06/28/2022 1057   CO2 24 06/28/2022 1057   GLUCOSE 107 (H) 06/28/2022 1057   BUN 17 06/28/2022 1057   CREATININE 0.75 06/28/2022 1057   CREATININE 0.72 05/19/2020 1129   CALCIUM 8.2 (L) 06/28/2022 1057   GFRNONAA >60 06/28/2022 1057   GFRNONAA >60 05/19/2020 1129   GFRAA >60 07/25/2019 0440   GFRAA >60 02/04/2019 0854    LABS from 09/06/2018  Creatinine 0.86, hemoglobin A1c 6.4%, glucose 108, hemoglobin 11.9, potassium 4.7, ferritin 12, normal liver function tests  LABS from 02/02/2018 Total cholesterol 141, HDL 43, LDL 73, triglycerides 125  Labs from 09/25/2019 Hemoglobin A1c 5.6%, hemoglobin 11.5, creatinine 0.99, potassium 4.7, normal liver function tests  Labs from 09/28/2021 Hemoglobin A1c 6.4%, hemoglobin 10.8, creatinine 0.82, potassium 5.4  09/20/2022 Cholesterol 137, HDL 48, LDL 69, triglycerides 107, hemoglobin A1c 5.4%  Recent Lipid Panel Lab Results  Component Value Date/Time   CHOL 218 (H) 06/29/2015 07:29 PM   TRIG 173 (H) 06/29/2015 07:29 PM   HDL 42 06/29/2015 07:29 PM   CHOLHDL 5.2 06/29/2015 07:29 PM   LDLCALC 141 (H) 06/29/2015 07:29 PM   Labs from 01/18/2019 Total cholesterol 126, HDL 54, LDL 53, triglycerides 92  Wt Readings from Last 3 Encounters:  02/22/23 200 lb (90.7 kg)  11/24/22 197 lb (89.4 kg)  07/15/22 201 lb (91.2 kg)     Objective:    Vital Signs:  BP 110/76 (BP Location: Left Arm, Patient Position: Sitting, Cuff Size: Normal)   Pulse 66   Ht 6\' 2"  (  1.88 m)   Wt 200 lb (90.7 kg)   SpO2 93%    BMI 25.68 kg/m     General: Alert, oriented x3, no distress, minimally overweight, he appears pale Head: no evidence of trauma, PERRL, EOMI, no exophtalmos or lid lag, no myxedema, no xanthelasma; normal ears, nose and oropharynx Neck: normal jugular venous pulsations and no hepatojugular reflux; brisk carotid pulses without delay and no carotid bruits Chest: clear to auscultation, no signs of consolidation by percussion or palpation, normal fremitus, symmetrical and full respiratory excursions Cardiovascular: normal position and quality of the apical impulse, regular rhythm, normal first and second heart sounds, unchanged apical midsystolic click and late systolic murmur radiating towards the left shoulder, no diastolic murmurs, rubs or gallops Abdomen: no tenderness or distention, no masses by palpation, no abnormal pulsatility or arterial bruits, normal bowel sounds, no hepatosplenomegaly Extremities: no clubbing, cyanosis or edema; 2+ radial, ulnar and brachial pulses bilaterally; 2+ right femoral, posterior tibial and dorsalis pedis pulses; 2+ left femoral, posterior tibial and dorsalis pedis pulses; no subclavian or femoral bruits Neurological: grossly nonfocal Psych: Normal mood and affect   ASSESSMENT & PLAN:    1. Coronary artery disease involving native coronary artery of native heart without angina pectoris   2. Aortic atherosclerosis (HCC)   3. MVP (mitral valve prolapse)   4. PVCs (premature ventricular contractions)   5. Hypercholesterolemia   6. Iron deficiency anemia, unspecified iron deficiency anemia type         CAD: He is completely asymptomatic but is also quite sedentary.  Previous cardiac catheterization showed plaque in all major coronary arteries, but he only had a significant stenosis in a very small and distal diagonal branch.  Sedentary lifestyle is the biggest issue, mostly related to his back pain Aortic atherosclerosis: Noted on imaging studies.  Normal  caliber aorta with no symptoms of PAD.  Pulmonary fibrosis: Seems to be a quiescent diagnosis.  Substantial changes as seen on chest CT, but these appear to be stationary.   Note that in 2018 he had pulmonary spirometry which showed FVC of 97% of predicted and FEV1 greater than 100% of predicted.   MVP/MR: Exam is unchanged.  He has mild mitral insufficiency by his most recent echo.  Will repeat this if he develops any symptoms PVCs: RVOT morphology in the past.  These have been present for many many years.  None were heard on exam today. OSA: Denies daytime hypersomnolence. (Pre)DM type 2: With additional weight loss is hemoglobin A1c is now within normal range. HLP: All lipid parameters are in normal range.  Continue pravastatin Orthostatic hypotension: Improved with low-dose midodrine.  Has a follow-up appointment with Dr. Loleta Chance in March Anemia: In the past he had problems with iron deficiency and required intravenous iron.  Hemoglobin seems to be drifting down again.  Will recheck.     Medication Adjustments/Labs and Tests Ordered: Current medicines are reviewed at length with the patient today.  Concerns regarding medicines are outlined above.   Tests Ordered: Orders Placed This Encounter  Procedures   CBC   Reticulocytes   Iron, TIBC and Ferritin Panel   EKG 12-Lead     Medication Changes: No orders of the defined types were placed in this encounter.  Patient Instructions  Medication Instructions:  No changes *If you need a refill on your cardiac medications before your next appointment, please call your pharmacy*   Lab Work: CBC, Reticulocytes, Iron Studies If you have labs (blood work) drawn today  and your tests are completely normal, you will receive your results only by: MyChart Message (if you have MyChart) OR A paper copy in the mail If you have any lab test that is abnormal or we need to change your treatment, we will call you to review the  results.   Follow-Up: At [facility], you and your health needs are our priority.  As part of our continuing mission to provide you with exceptional heart care, we have created designated Provider Care Teams.  These Care Teams include your primary Cardiologist (physician) and Advanced Practice Providers (APPs -  Physician Assistants and Nurse Practitioners) who all work together to provide you with the care you need, when you need it.  We recommend signing up for the patient portal called "MyChart".  Sign up information is provided on this After Visit Summary.  MyChart is used to connect with patients for Virtual Visits (Telemedicine).  Patients are able to view lab/test results, encounter notes, upcoming appointments, etc.  Non-urgent messages can be sent to your provider as well.   To learn more about what you can do with MyChart, go to [email or web address].    Your next appointment:   1 year(s)  Provider:   Dr [name]    Signed, Thurmon Fair, MD  02/26/2023 1:29 PM    [facility]

## 2023-02-22 NOTE — Patient Instructions (Signed)
Medication Instructions:  No changes *If you need a refill on your cardiac medications before your next appointment, please call your pharmacy*   Lab Work: CBC, Reticulocytes, Iron Studies If you have labs (blood work) drawn today and your tests are completely normal, you will receive your results only by: MyChart Message (if you have MyChart) OR A paper copy in the mail If you have any lab test that is abnormal or we need to change your treatment, we will call you to review the results.   Follow-Up: At [facility], you and your health needs are our priority.  As part of our continuing mission to provide you with exceptional heart care, we have created designated Provider Care Teams.  These Care Teams include your primary Cardiologist (physician) and Advanced Practice Providers (APPs -  Physician Assistants and Nurse Practitioners) who all work together to provide you with the care you need, when you need it.  We recommend signing up for the patient portal called "MyChart".  Sign up information is provided on this After Visit Summary.  MyChart is used to connect with patients for Virtual Visits (Telemedicine).  Patients are able to view lab/test results, encounter notes, upcoming appointments, etc.  Non-urgent messages can be sent to your provider as well.   To learn more about what you can do with MyChart, go to [email or web address].    Your next appointment:   1 year(s)  Provider:   Dr Royann Shivers

## 2023-02-23 LAB — CBC
Hematocrit: 33 % — ABNORMAL LOW (ref 37.5–51.0)
Hemoglobin: 10.5 g/dL — ABNORMAL LOW (ref 13.0–17.7)
MCH: 29.8 pg (ref 26.6–33.0)
MCHC: 31.8 g/dL (ref 31.5–35.7)
MCV: 94 fL (ref 79–97)
Platelets: 270 10*3/uL (ref 150–450)
RBC: 3.52 x10E6/uL — ABNORMAL LOW (ref 4.14–5.80)
RDW: 13.3 % (ref 11.6–15.4)
WBC: 6.7 10*3/uL (ref 3.4–10.8)

## 2023-02-23 LAB — IRON,TIBC AND FERRITIN PANEL
Ferritin: 52 ng/mL (ref 30–400)
Iron Saturation: 15 % (ref 15–55)
Iron: 43 ug/dL (ref 38–169)
Total Iron Binding Capacity: 287 ug/dL (ref 250–450)
UIBC: 244 ug/dL (ref 111–343)

## 2023-03-28 DIAGNOSIS — M4326 Fusion of spine, lumbar region: Secondary | ICD-10-CM | POA: Diagnosis not present

## 2023-03-28 DIAGNOSIS — G894 Chronic pain syndrome: Secondary | ICD-10-CM | POA: Diagnosis not present

## 2023-04-01 ENCOUNTER — Encounter (HOSPITAL_COMMUNITY): Payer: Self-pay

## 2023-04-01 ENCOUNTER — Emergency Department (HOSPITAL_COMMUNITY): Payer: Medicare Other

## 2023-04-01 ENCOUNTER — Emergency Department (HOSPITAL_COMMUNITY)
Admission: EM | Admit: 2023-04-01 | Discharge: 2023-04-01 | Disposition: A | Discharge status: Home / Self Care | Payer: Medicare Other | Attending: Emergency Medicine | Admitting: Emergency Medicine

## 2023-04-01 ENCOUNTER — Other Ambulatory Visit: Payer: Self-pay

## 2023-04-01 DIAGNOSIS — R519 Headache, unspecified: Secondary | ICD-10-CM | POA: Diagnosis not present

## 2023-04-01 DIAGNOSIS — W01198A Fall on same level from slipping, tripping and stumbling with subsequent striking against other object, initial encounter: Secondary | ICD-10-CM | POA: Diagnosis not present

## 2023-04-01 DIAGNOSIS — Z7982 Long term (current) use of aspirin: Secondary | ICD-10-CM | POA: Insufficient documentation

## 2023-04-01 DIAGNOSIS — Y92009 Unspecified place in unspecified non-institutional (private) residence as the place of occurrence of the external cause: Secondary | ICD-10-CM | POA: Diagnosis not present

## 2023-04-01 DIAGNOSIS — J45909 Unspecified asthma, uncomplicated: Secondary | ICD-10-CM | POA: Diagnosis not present

## 2023-04-01 DIAGNOSIS — S060X1A Concussion with loss of consciousness of 30 minutes or less, initial encounter: Secondary | ICD-10-CM

## 2023-04-01 DIAGNOSIS — S0990XA Unspecified injury of head, initial encounter: Secondary | ICD-10-CM | POA: Diagnosis not present

## 2023-04-01 DIAGNOSIS — W19XXXA Unspecified fall, initial encounter: Secondary | ICD-10-CM

## 2023-04-01 DIAGNOSIS — R9431 Abnormal electrocardiogram [ECG] [EKG]: Secondary | ICD-10-CM | POA: Diagnosis not present

## 2023-04-01 DIAGNOSIS — S199XXA Unspecified injury of neck, initial encounter: Secondary | ICD-10-CM | POA: Diagnosis not present

## 2023-04-01 LAB — CBC WITH DIFFERENTIAL/PLATELET
Abs Immature Granulocytes: 0.02 10*3/uL (ref 0.00–0.07)
Basophils Absolute: 0.1 10*3/uL (ref 0.0–0.1)
Basophils Relative: 1 %
Eosinophils Absolute: 0.5 10*3/uL (ref 0.0–0.5)
Eosinophils Relative: 8 %
HCT: 35.3 % — ABNORMAL LOW (ref 39.0–52.0)
Hemoglobin: 10.9 g/dL — ABNORMAL LOW (ref 13.0–17.0)
Immature Granulocytes: 0 %
Lymphocytes Relative: 26 %
Lymphs Abs: 1.6 10*3/uL (ref 0.7–4.0)
MCH: 29.6 pg (ref 26.0–34.0)
MCHC: 30.9 g/dL (ref 30.0–36.0)
MCV: 95.9 fL (ref 80.0–100.0)
Monocytes Absolute: 0.6 10*3/uL (ref 0.1–1.0)
Monocytes Relative: 10 %
Neutro Abs: 3.4 10*3/uL (ref 1.7–7.7)
Neutrophils Relative %: 55 %
Platelets: 273 10*3/uL (ref 150–400)
RBC: 3.68 MIL/uL — ABNORMAL LOW (ref 4.22–5.81)
RDW: 14 % (ref 11.5–15.5)
WBC: 6.3 10*3/uL (ref 4.0–10.5)
nRBC: 0 % (ref 0.0–0.2)

## 2023-04-01 LAB — COMPREHENSIVE METABOLIC PANEL
ALT: 24 U/L (ref 0–44)
AST: 29 U/L (ref 15–41)
Albumin: 3.5 g/dL (ref 3.5–5.0)
Alkaline Phosphatase: 65 U/L (ref 38–126)
Anion gap: 7 (ref 5–15)
BUN: 16 mg/dL (ref 8–23)
CO2: 24 mmol/L (ref 22–32)
Calcium: 9 mg/dL (ref 8.9–10.3)
Chloride: 107 mmol/L (ref 98–111)
Creatinine, Ser: 0.84 mg/dL (ref 0.61–1.24)
GFR, Estimated: >60 mL/min (ref >60)
Glucose, Bld: 89 mg/dL (ref 70–99)
Potassium: 4.9 mmol/L (ref 3.5–5.1)
Sodium: 138 mmol/L (ref 135–145)
Total Bilirubin: 0.3 mg/dL (ref 0.0–1.2)
Total Protein: 7 g/dL (ref 6.5–8.1)

## 2023-04-01 LAB — CBG MONITORING, ED: Glucose-Capillary: 111 mg/dL — ABNORMAL HIGH (ref 70–99)

## 2023-04-01 MED ORDER — ACETAMINOPHEN 500 MG PO TABS: 1000.0000 mg | ORAL_TABLET | Freq: Once | ORAL | Status: DC

## 2023-04-01 MED ORDER — MECLIZINE HCL 25 MG PO TABS: 25.0000 mg | ORAL_TABLET | Freq: Three times a day (TID) | ORAL | 0 refills | Status: AC | PRN

## 2023-04-01 MED ORDER — ONDANSETRON 4 MG PO TBDP: 4.0000 mg | ORAL_TABLET | Freq: Three times a day (TID) | ORAL | 0 refills | Status: AC | PRN

## 2023-04-01 NOTE — ED Triage Notes (Signed)
Pt had a fall Wednesday hitting his face on the concrete while getting out of his car. Pt has bruising to the left side of his face and is complaining of headaches, nausea, and dizziness.

## 2023-04-01 NOTE — ED Provider Notes (Signed)
[place] EMERGENCY DEPARTMENT AT [facility] Provider Note   CSN: [id number] Arrival date & time: 04/01/23  1021     History  Chief Complaint  Patient presents with   [name]. is a 78 y.o. male with PMH as listed below who presents with fall.  Pt had a mechanical fall Wednesday hitting his face on the concrete while getting out of his car.  Witnesses noted that he lost consciousness for 3 to 5 seconds and then woke up.  Had nausea but no vomiting afterward.  Pt has bruising to the left side of his face and had a hematoma there which is decreased in size but is still present, and is complaining of headaches, nausea, and dizziness.  He feels like his gait is off as well since that time.  Headache right now rated 4 out of 10 after his 10 mg p.o. Oxy this morning which she takes at baseline for his back pain. Doesn't take blood thinners.   Past Medical History:  Diagnosis Date   Arthritis    Asthma    anxiety, tree/grass pollen   BPH (benign prostatic hyperplasia)    Bronchitis    hx of   Bulge of cervical disc without myelopathy    Chest pain    a. 06/2015 MV: EF 56%, no ischemia/infarct.   Complication of anesthesia 04/01/2013   no complications but just says he typically needs "more than expected" to anesthetize; needs CPAP (setting 10) in recovery   Depression    ADD   Insomnia    Low blood pressure    Mitral valve prolapse    a. 06/2015 Echo: EF 65-70%, mild LVH, no rwma, mild MVP of ant leaflet and mild to mod posteriorly directed MR, mildly dil La.   Obesity    Pre-diabetes    PVC's (premature ventricular contractions)    Recurrent upper respiratory infection (URI)    states Dr Lequita Halt aware- no fever- states is improving   Sleep apnea    no longer uses cpap due to weight loss   Tremor    d/t lithium       Home Medications Prior to Admission medications   Medication Sig Start Date End Date Taking? Authorizing Provider  meclizine  (ANTIVERT) 25 MG tablet Take 1 tablet (25 mg total) by mouth 3 (three) times daily as needed for dizziness. 04/01/23  Yes Loetta Rough, MD  ondansetron (ZOFRAN-ODT) 4 MG disintegrating tablet Take 1 tablet (4 mg total) by mouth every 8 (eight) hours as needed. 04/01/23  Yes Loetta Rough, MD  acetaminophen (TYLENOL) 325 MG tablet Take 650 mg by mouth every 6 (six) hours as needed.    [provider]  albuterol (VENTOLIN HFA) 108 (90 Base) MCG/ACT inhaler TAKE 2 PUFFS BY MOUTH EVERY 6 HOURS AS NEEDED FOR WHEEZE OR SHORTNESS OF BREATH 10/09/19   Nyoka Cowden, MD  alfuzosin (UROXATRAL) 10 MG 24 hr tablet Take 10 mg by mouth daily. 05/20/21   [provider]  aspirin EC 81 MG tablet Take 81 mg by mouth 2 (two) times daily.    [provider]  dutasteride (AVODART) 0.5 MG capsule Take 0.5 mg by mouth daily. 05/30/14   [provider]  Eszopiclone 3 MG TABS Take 3 mg by mouth at bedtime. 09/02/21   [provider]  finasteride (PROSCAR) 5 MG tablet Take 5 mg by mouth daily.    [provider]  gabapentin (  NEURONTIN) 400 MG capsule Take 300 mg by mouth 3 (three) times daily. 07/11/19   [provider]  LORazepam (ATIVAN) 0.5 MG tablet Take 0.5 mg by mouth in the morning and at bedtime. 08/23/21   [provider]  MACROBID 100 MG capsule Take 100 mg by mouth 2 (two) times daily. Patient not taking: Reported on 02/22/2023 06/17/22   [provider]  metFORMIN (GLUCOPHAGE) 500 MG tablet Take 500 mg by mouth 2 (two) times daily with a meal.     [provider]  methocarbamol (ROBAXIN) 750 MG tablet Take 750 mg by mouth 3 (three) times daily. 01/14/23   [provider]  methylphenidate 27 MG PO TB24 Take 27 mg by mouth daily.    [provider]  midodrine (PROAMATINE) 2.5 MG tablet Take 2.5 mg by mouth 2 (two) times daily. 06/17/22   [provider]  Multiple Vitamins-Minerals (CENTRUM SILVER ADULT 50+  PO) Take 1 tablet by mouth daily.    [provider]  oxybutynin (DITROPAN) 5 MG tablet Take 5 mg by mouth at bedtime. 02/08/23   [provider]  Oxycodone HCl 10 MG TABS Take 10 mg by mouth every 6 (six) hours as needed (pain). 06/13/22   [provider]  pravastatin (PRAVACHOL) 40 MG tablet Take 40 mg by mouth daily.    [provider]  sertraline (ZOLOFT) 100 MG tablet Take 100 mg by mouth every morning.    [provider]  venlafaxine XR (EFFEXOR-XR) 75 MG 24 hr capsule Take 150 mg by mouth every morning. 09/01/21   [provider]      Allergies    Breo ellipta [fluticasone furoate-vilanterol], Demerol [meperidine], Lamotrigine, and Nsaids    Review of Systems   Review of Systems A 10 point review of systems was performed and is negative unless otherwise reported in HPI.  Physical Exam Updated Vital Signs BP (!) 140/86 (BP Location: Right Arm)   Pulse (!) 56   Temp 98 F (36.7 C) (Oral)   Resp 18   Ht 6\' 2"  (1.88 m)   Wt 90.7 kg   SpO2 99%   BMI 25.68 kg/m  Physical Exam  PRIMARY SURVEY  Airway Airway intact  Breathing Bilateral breath sounds  Circulation Carotid/femoral pulses 2+ intact bilaterally  GCS E =  4 V =  5 M =  6 Total = 15  Environment All clothes removed      SECONDARY SURVEY  Gen: -NAD  HEENT: -Head: Scalp is clear of lacerations or wounds. Skull is clear of deformities or depressions -Forehead: Ecchymoses with mild hematoma to lateral L eyebrow -Midface: Stable -Eyes: Periorbital ecchymoses on the left, healing, with mild edema. No instability of the orbit to palpation. No depressions/deformities noted to the skull. No visible injury to eyelids or eye, PERRL, EOMI -Nose: No gross deformities -Mouth: No injuries to lips, tongue or teeth. No trismus or malposition -Ears: No auricular hematoma -Neck: Trachea is midline, no distended neck veins  Chest: -No tenderness, deformities, bruising or crepitus  to clavicles or chest -Normal chest expansion -Normal heart sounds, S1/S2 normal, no m/r/g -No wheezes, rales, rhonchi  Abdomen: -No tenderness, bruising or penetrating injury  Pelvis: -Pelvis is stable and non-tender  Extremities: Right Upper Extremity: -No point tenderness, deformity or other signs of injury -Radial pulse intact RUE, cap refill good -Normal sensation -Normal ROM, good strength Left Upper Extremity: -No point tenderness, deformity or other signs of injury -Radial pulse intact LUE, cap  refill good -Normal sensation -Normal ROM, good strength Right Lower Extremity: -No point tenderness, deformity or other signs of injury -DP intact RLE -Normal sensation -Normal ROM, good strength Left Lower Extremity: -Mild abrasion to L knee -No point tenderness, deformity or other signs of injury -DP intact LLE -Normal sensation -Normal ROM, good strength  Back/Spine: -No midline C, T, or L spine tenderness or step-offs  Other: N/A     ED Results / Procedures / Treatments   Labs (all labs ordered are listed, but only abnormal results are displayed) Labs Reviewed  CBC WITH DIFFERENTIAL/PLATELET - Abnormal; Notable for the following components:      Result Value   RBC 3.68 (*)    Hemoglobin 10.9 (*)    HCT 35.3 (*)    All other components within normal limits  CBG MONITORING, ED - Abnormal; Notable for the following components:   Glucose-Capillary 111 (*)    All other components within normal limits  COMPREHENSIVE METABOLIC PANEL    EKG EKG Interpretation Date/Time:  Saturday April 01 2023 11:18:42 EST Ventricular Rate:  51 PR Interval:  197 QRS Duration:  152 QT Interval:  478 QTC Calculation: 441 R Axis:   -5  Text Interpretation: Sinus rhythm Right bundle branch block Confirmed by Zadie Rhine ([id number]) on 04/02/2023 11:40:12 AM  Radiology CT head:   1.  No evidence of an acute intracranial abnormality. 2. Small left periorbital hematoma. 3.  Generalized parenchymal atrophy. 4. Paranasal sinus disease at the imaged levels as described.   CT cervical spine:   1. No evidence of an acute cervical spine fracture. 2. Nonspecific straightening of the expected cervical lordosis. 3. Grade 1 anterolisthesis at C4-C5, C5-C6, C7-T1 and T1-T2. 4. Cervical spondylosis as described. Spinal canal stenosis is greatest at C4-C5 (at least moderate and possibly severe), C5-C6 (severe) and C6-C7 (moderate). Multilevel bony neural foraminal narrowing.     Procedures Procedures    Medications Ordered in ED Medications - No data to display  ED Course/ Medical Decision Making/ A&P                          Medical Decision Making Amount and/or Complexity of Data Reviewed Labs: ordered. Decision-making details documented in ED Course. Radiology: ordered. Decision-making details documented in ED Course.  Risk Prescription drug management.    This patient presents to the ED for concern of head injury, headache, nausea/dizziness, this involves an extensive number of treatment options, and is a complaint that carries with it a high risk of complications and morbidity.  I considered the following differential and admission for this acute, potentially life threatening condition.   MDM:    DDX for trauma includes but is not limited to:  -Head Injury such as skull fx or ICH -Vertebral injury -Has ecchymosis and mild periorbital edema over the left side but no instability to the orbit, extraocular movements are intact, no indication of entrapped extraocular muscle or unstable fracture. -Patient endorses that his fall was mechanical as he simply tripped out of the car.  Otherwise has been at his normal state of health.  Did report dizziness and fall and obtain EKG which demonstrated sinus bradycardia with right bundle branch block.  He is mildly bradycardic at this time but does not feel significantly dizzy or lightheaded now and I doubt that  these symptoms are from mild bradycardia as opposed to the head trauma he suffered in the last few days.  Patient also does  take midodrine at home for prior orthostatic hypotension but states he has not had any changes in that dose of medication and notes that this feels different than that.  Patient has had no chest pain dictate ACS.  I obtained labs also which demonstrated no electrolyte derangements or renal injury.  Does have mild anemia which seems to be baseline for him.  No significant hypo or hyperglycemia at this time -Patient CT head is reassuring against any traumatic intracranial injury.  I did discuss with the patient but that his symptoms would be consistent with mild TBI or concussion.  I discussed with the patient but concussion care is taking it easy and not doing things that exacerbate his symptoms such as reading or watching TV if that is the case.  For his symptoms I prescribed Zofran as well as meclizine and instructed him to stay well-hydrated at home.  Will instruct him to follow-up with his PCP as well.   Clinical Course as of 04/07/23 1907  Sat Apr 01, 2023  1152 CT Head Wo Contrast IMPRESSION: CT head:  1.  No evidence of an acute intracranial abnormality. 2. Small left periorbital hematoma. 3. Generalized parenchymal atrophy. 4. Paranasal sinus disease at the imaged levels as described.  CT cervical spine:  1. No evidence of an acute cervical spine fracture. 2. Nonspecific straightening of the expected cervical lordosis. 3. Grade 1 anterolisthesis at C4-C5, C5-C6, C7-T1 and T1-T2. 4. Cervical spondylosis as described. Spinal canal stenosis is greatest at C4-C5 (at least moderate and possibly severe), C5-C6 (severe) and C6-C7 (moderate). Multilevel bony neural foraminal narrowing.   [HN]  1303 Awaiting labwork [HN]  1353 CBC with Differential(!) .unremarkable. Anemia at baseline [HN]  1402 Pt ambulated and feeling mildly lightheaded but well. He does not have any  observable ataxia or weakness. No wide-based gait or shuffling gait. Patient feels well ambulating and is relieved his CTH is neg. Discussed concussion recommendations and patient states he will make an appt with his PCP on Monday.  [HN]  1408 Comprehensive metabolic panel wnl [HN]    Clinical Course User Index [HN] Loetta Rough, MD    Labs: I Ordered, and personally interpreted labs.  The pertinent results include:  those listed above  Imaging Studies ordered: I ordered imaging studies including CTH, CT C-spine I independently visualized and interpreted imaging. I agree with the radiologist interpretation  Additional history obtained from chart review, wife at bedside.  Reevaluation: After the interventions noted above, I reevaluated the patient and found that they have :improved  Social Determinants of Health: Lives independently  Disposition:  DC w/ discharge instructions/return precautions. All questions answered to patient's satisfaction.  Instructed to follow-up with PCP within 1 to 2 weeks  Co morbidities that complicate the patient evaluation  Past Medical History:  Diagnosis Date   Arthritis    Asthma    anxiety, tree/grass pollen   BPH (benign prostatic hyperplasia)    Bronchitis    hx of   Bulge of cervical disc without myelopathy    Chest pain    a. 06/2015 MV: EF 56%, no ischemia/infarct.   Complication of anesthesia 04/01/2013   no complications but just says he typically needs "more than expected" to anesthetize; needs CPAP (setting 10) in recovery   Depression    ADD   Insomnia    Low blood pressure    Mitral valve prolapse    a. 06/2015 Echo: EF 65-70%, mild LVH, no rwma, mild MVP of ant leaflet  and mild to mod posteriorly directed MR, mildly dil La.   Obesity    Pre-diabetes    PVC's (premature ventricular contractions)    Recurrent upper respiratory infection (URI)    states Dr Lequita Halt aware- no fever- states is improving   Sleep apnea    no  longer uses cpap due to weight loss   Tremor    d/t lithium     Medicines Meds ordered this encounter  Medications   DISCONTD: acetaminophen (TYLENOL) tablet 1,000 mg   ondansetron (ZOFRAN-ODT) 4 MG disintegrating tablet    Sig: Take 1 tablet (4 mg total) by mouth every 8 (eight) hours as needed.    Dispense:  20 tablet    Refill:  0   meclizine (ANTIVERT) 25 MG tablet    Sig: Take 1 tablet (25 mg total) by mouth 3 (three) times daily as needed for dizziness.    Dispense:  30 tablet    Refill:  0    I have reviewed the patients home medicines and have made adjustments as needed  Problem List / ED Course: Problem List Items Addressed This Visit   None Visit Diagnoses       Injury of head, initial encounter    -  Primary     Fall in home, initial encounter         Concussion with loss of consciousness of 30 minutes or less, initial encounter                       This note was created using dictation software, which may contain spelling or grammatical errors.    Loetta Rough, MD 04/07/23 [phone]

## 2023-04-01 NOTE — Discharge Instructions (Addendum)
Thank you for coming to [facility] Emergency Department. You were seen for head injury. We did an exam, labs, and imaging, and these showed no acute findings. Likely you have a concussion. We have prescribed zofran 4 mg under the tongue to use every 6-8 hours as needed for nausea/vomiting. We have also prescribed meclizine 25 mg every 8 hours as needed for dizziness. Please stay well hydrated at home.  Please follow up with your primary care provider within 1 week.   Do not hesitate to return to the ED or call 911 if you experience: -Worsening symptoms -Lightheadedness, passing out -Fevers/chills -Anything else that concerns you

## 2023-04-04 ENCOUNTER — Other Ambulatory Visit: Payer: Self-pay | Admitting: Orthopaedic Surgery

## 2023-04-04 DIAGNOSIS — M21372 Foot drop, left foot: Secondary | ICD-10-CM

## 2023-04-04 DIAGNOSIS — M4326 Fusion of spine, lumbar region: Secondary | ICD-10-CM

## 2023-04-04 DIAGNOSIS — M5416 Radiculopathy, lumbar region: Secondary | ICD-10-CM

## 2023-04-04 DIAGNOSIS — M961 Postlaminectomy syndrome, not elsewhere classified: Secondary | ICD-10-CM

## 2023-04-12 NOTE — Discharge Instructions (Signed)
Myelogram Discharge Instructions  Go home and rest quietly as needed. You may resume normal activities; however, do not exert yourself strongly or do any heavy lifting today and tomorrow.   DO NOT drive today.    You may resume your normal diet and medications unless otherwise indicated. Drink lots of extra fluids today and tomorrow.   The incidence of headache, nausea, or vomiting is about 5% (one in 20 patients).  If you develop a headache, lie flat for 24 hours and drink plenty of fluids until the headache goes away.  Caffeinated beverages may be helpful. If when you get up you still have a headache when standing, go back to bed and force fluids for another 24 hours.   If you develop severe nausea and vomiting or a headache that does not go away with the flat bedrest after 48 hours, please call [phone].   Call your physician for a follow-up appointment.  The results of your myelogram will be sent directly to your physician by the following day.  If you have any questions or if complications develop after you arrive home, please call [phone].  Discharge instructions have been explained to the patient.  The patient, or the person responsible for the patient, fully understands these instructions.   Thank you for visiting our office today.

## 2023-04-13 ENCOUNTER — Ambulatory Visit
Admission: RE | Admit: 2023-04-13 | Discharge: 2023-04-13 | Disposition: A | Discharge status: Home / Self Care | Payer: Medicare Other | Source: Ambulatory Visit | Attending: Orthopaedic Surgery | Admitting: Orthopaedic Surgery

## 2023-04-13 DIAGNOSIS — M4326 Fusion of spine, lumbar region: Secondary | ICD-10-CM

## 2023-04-13 DIAGNOSIS — M961 Postlaminectomy syndrome, not elsewhere classified: Secondary | ICD-10-CM

## 2023-04-13 DIAGNOSIS — M5416 Radiculopathy, lumbar region: Secondary | ICD-10-CM

## 2023-04-13 DIAGNOSIS — M21372 Foot drop, left foot: Secondary | ICD-10-CM

## 2023-04-13 DIAGNOSIS — M48061 Spinal stenosis, lumbar region without neurogenic claudication: Secondary | ICD-10-CM | POA: Diagnosis not present

## 2023-04-13 DIAGNOSIS — Z981 Arthrodesis status: Secondary | ICD-10-CM | POA: Diagnosis not present

## 2023-04-13 MED ORDER — DIAZEPAM 5 MG PO TABS: 5.0000 mg | ORAL_TABLET | Freq: Once | ORAL | Status: DC

## 2023-04-13 MED ORDER — ONDANSETRON HCL 4 MG/2ML IJ SOLN: 4.0000 mg | Freq: Once | INTRAMUSCULAR | Status: DC | PRN

## 2023-04-13 MED ORDER — IOPAMIDOL (ISOVUE-M 200) INJECTION 41%
20.0000 mL | Freq: Once | INTRAMUSCULAR | Status: AC
  Administered 2023-04-13: 20 mL via INTRATHECAL

## 2023-04-19 DIAGNOSIS — G894 Chronic pain syndrome: Secondary | ICD-10-CM | POA: Diagnosis not present

## 2023-04-19 DIAGNOSIS — M4326 Fusion of spine, lumbar region: Secondary | ICD-10-CM | POA: Diagnosis not present

## 2023-04-19 DIAGNOSIS — Z6828 Body mass index (BMI) 28.0-28.9, adult: Secondary | ICD-10-CM | POA: Diagnosis not present

## 2023-05-15 NOTE — Progress Notes (Deleted)
 I saw [name]. in neurology clinic on 05/24/23 in follow up for imbalance, falls, and left foot drop.  HPI: [name]. is a 78 y.o. year old male with a history of orthostatic hypotension, ILD, iron deficiency anemia, DM2 c/b neuropathy, HLD, depression, OA, OSA (on CPAP), lumbosacral spondylosis s/p surgery x2, former smoker who we last saw on 11/24/22.  To briefly review: 07/15/22: Patient first notice drifting side to side for about 2 years. He has noticed his feet points inward. Per patient, symptom onset appears to be after lumbar spine surgery (about 2 years ago). The first surgery was L4-L5-S1 and the second surgery was L3 and S1. He feels like the left quad has not been the same since surgery. He had surgery due to severe back pain and radiating pain to feet. Since surgery, his pain has greatly improved. He endorses numbness in the left upper quad, but otherwise none.    Patient has most trouble when he first gets up, feel lightheaded and dizzy. He has a history of orthostatic hypotension and is on midodrine. That will improve with a little time. When he walks for a longer time, he will have an increase in symptoms. Patient states that if he rests and puts his head below the heart, his symptoms will improve. Patient mentions that when he goes to the store, he will lean on a shopping cart and this will improve his symptoms.   Patient mentions urinary incontinence during sleep for the last 6 months. This happens early in the night and after he changes, it does recur. He denies any fecal incontinence. He denies saddle anesthesia. Of note, there was no ventriculomegaly on [facility] (06/28/22). There was moderate spinal stenosis at C5-6 on MRI cervical spine from 06/28/22   Patient endorses falls. He has been falling for 3 years, with an increase more recently. He has had 5-6 falls since the beginning of 2024. Wife mentions he is often dehydrated, not drinking much water and instead  drinking soda or tea. He has also had a few urinary tract infections. He has fallen 1 time in the last month for which he presented to ED on 06/28/22 with 2 days of diffuse weakness without myalgia or sensory changes. He had fallen and was unable to move well. Since recognizing the dehydration, this may have improved some of the falls.   Patient on is B12, B complex, and a multivitamin. Patient is also on gabapentin [phone] mg daily (400 mg pills, usually 3 times per day). He takes gabapentin for pain, which he feels works. He also takes oxycodone for pain. He also sometimes takes a muscle relaxor. He also takes ativan for anxiety PRN. He is also on effexor.   He does not report any constitutional symptoms like fever, night sweats, anorexia or unintentional weight loss.   EtOH use: No  Restrictive diet? No, but poor diet   He had an EMG many years ago, but doesn't remember.  11/24/22: Labs were normal. Per my telephone note from 08/02/22: Called patient to discuss the results of his MRI thoracic and lumbar spine. Thoracic spine was unremarkable. Lumbar spine did show some neural foraminal stenosis at L3 (right > left) which could explain the symptoms in his thighs. 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    Patient is calling PT today to get this set up.  He will call if he has new or worsening symptoms.   Patient's imbalance is about the same. He has fallen a couple of times, but he mentions this was when he was getting over a ramp. He is concerned about weight loss and thinks he is losing muscle mass. He has numbness in his toes.    Patient has been reluctant to go to PT. He is not active due to concerns of hurting himself (knees, hips, back). Patient has an AFO for left foot drop since back surgery, but has not been using it.  Most recent Assessment and  Plan (11/24/22): This is Ronald Ply., a 78 y.o. male with imbalance and left leg weakness (left foot drop). His symptoms are most consistent with know lumbar spine disease, with prior surgery at L4-5 and L5-S1 and possible mass effect at L3. Patient has been inactive and reluctant to go to PT for unclear reasons, with deconditioning likely causing his increase in weakness.   Plan: -Continue to follow with spine - sees Dr. Noel Gerold -Physical therapy for left leg weakness and imbalance -Fall precautions discussed -Patient would also benefit from wearing his AFO  Since their last visit: ***  He had a fall on 04/01/23 for which he went to the ED> He fell getting out of the car and hit his face. There was a few seconds of LOC. He had HA, nausea, and dizziness after. CT head showed no acute process. CT cervical spine showed at least moderate and maybe severe C4-5 canal stenosis, C5-6 severe canal stenosis, and C6-7 moderate canal stenosis.  Has patient discussed cervical spine findings with Dr. Noel Gerold?***  ROS: Pertinent positive and negative systems reviewed in HPI. ***   MEDICATIONS:  Outpatient Encounter Medications as of 05/24/2023  Medication Sig Note   acetaminophen (TYLENOL) 325 MG tablet Take 650 mg by mouth every 6 (six) hours as needed.    albuterol (VENTOLIN HFA) 108 (90 Base) MCG/ACT inhaler TAKE 2 PUFFS BY MOUTH EVERY 6 HOURS AS NEEDED FOR WHEEZE OR SHORTNESS OF BREATH    alfuzosin (UROXATRAL) 10 MG 24 hr tablet Take 10 mg by mouth daily.    aspirin EC 81 MG tablet Take 81 mg by mouth 2 (two) times daily.    dutasteride (AVODART) 0.5 MG capsule Take 0.5 mg by mouth daily.    Eszopiclone 3 MG TABS Take 3 mg by mouth at bedtime. 12/15/2021: Patient takes as needed   finasteride (PROSCAR) 5 MG tablet Take 5 mg by mouth daily.    gabapentin (NEURONTIN) 400 MG capsule Take 300 mg by mouth 3 (three) times daily.    LORazepam (ATIVAN) 0.5 MG tablet Take 0.5 mg by mouth in the morning  and at bedtime.    MACROBID 100 MG capsule Take 100 mg by mouth 2 (two) times daily. (Patient not taking: Reported on 02/22/2023)    meclizine (ANTIVERT) 25 MG tablet Take 1 tablet (25 mg total) by mouth 3 (three) times daily as needed for dizziness.    metFORMIN (GLUCOPHAGE) 500 MG tablet Take 500 mg by mouth 2 (two) times daily with a meal.     methocarbamol (ROBAXIN) 750 MG tablet Take 750 mg by mouth 3 (three) times daily.    methylphenidate 27 MG PO TB24 Take 27 mg by mouth daily.    midodrine (PROAMATINE) 2.5 MG tablet Take 2.5 mg by mouth 2 (two) times daily.    Multiple Vitamins-Minerals (CENTRUM SILVER ADULT 50+ PO) Take 1 tablet by mouth daily.  ondansetron (ZOFRAN-ODT) 4 MG disintegrating tablet Take 1 tablet (4 mg total) by mouth every 8 (eight) hours as needed.    oxybutynin (DITROPAN) 5 MG tablet Take 5 mg by mouth at bedtime.    Oxycodone HCl 10 MG TABS Take 10 mg by mouth every 6 (six) hours as needed (pain). 02/22/2023: Patient takes 5 times daily   pravastatin (PRAVACHOL) 40 MG tablet Take 40 mg by mouth daily.    sertraline (ZOLOFT) 100 MG tablet Take 100 mg by mouth every morning.    venlafaxine XR (EFFEXOR-XR) 75 MG 24 hr capsule Take 150 mg by mouth every morning.    No facility-administered encounter medications on file as of 05/24/2023.    PAST MEDICAL HISTORY: Past Medical History:  Diagnosis Date   Arthritis    Asthma    anxiety, tree/grass pollen   BPH (benign prostatic hyperplasia)    Bronchitis    hx of   Bulge of cervical disc without myelopathy    Chest pain    a. 06/2015 MV: EF 56%, no ischemia/infarct.   Complication of anesthesia 04/01/2013   no complications but just says he typically needs "more than expected" to anesthetize; needs CPAP (setting 10) in recovery   Depression    ADD   Insomnia    Low blood pressure    Mitral valve prolapse    a. 06/2015 Echo: EF 65-70%, mild LVH, no rwma, mild MVP of ant leaflet and mild to mod posteriorly  directed MR, mildly dil La.   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   JOINT REPLACEMENT     Left, Right Knee, Right hip   KNEE ARTHROSCOPY     right x 3-4, left x 2   KNEE ARTHROTOMY  ~1991   right   LUMBAR FUSION  04/03/2013   Dr Yetta Barre, L5-S1   POLYPECTOMY     vocal cords 1992   REVERSE SHOULDER ARTHROPLASTY Right 09/27/2021   Procedure: REVERSE SHOULDER ARTHROPLASTY;  Surgeon: Beverely Low, MD;  Location: [facility];  Service: Orthopedics;  Laterality: Right;  with ISB   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   Procedure: LEFT TOTAL HIP ARTHROPLASTY ANTERIOR APPROACH;  Surgeon: Durene Romans, MD;  Location: [facility];  Service: Orthopedics;  Laterality: Left;   TOTAL HIP REVISION  04/22/2011   Procedure: TOTAL HIP REVISION;  Surgeon: Loanne Drilling, MD;  Location: [facility];  Service: Orthopedics;  Laterality: Right;    ALLERGIES: Allergies  Allergen Reactions   Breo Ellipta [Fluticasone Furoate-Vilanterol] Other (See Comments)    Thrush   Demerol [Meperidine] Itching   Lamotrigine Hives and Itching   Nsaids Nausea Only    Gastric bypass    FAMILY HISTORY: Family History  Problem Relation Age of Onset   Heart disease Father    Anxiety disorder Mother    Asthma Maternal Grandmother     SOCIAL HISTORY: Social History   Tobacco Use   Smoking status: Former  Current packs/day: 0.00    Average packs/day: 1.5 packs/day for 20.0 years (30.0 ttl pk-yrs)    Types: Cigarettes    Start date: 04/17/1970    Quit date: 04/17/1990    Years since quitting: 33.0    Smokeless tobacco: Never  Vaping Use   Vaping status: Never Used  Substance Use Topics   Alcohol use: No   Drug use: No   Social History   Social History Narrative   Patient works for [employer]. Patient has masters degree. Patient is married.   Are you right handed or left handed? right   Are you currently employed ?    What is your current occupation?retired   Do you live at home alone?   Who lives with you? wife   What type of home do you live in: 1 story or 2 story? one    Caffeine 1 cup 3 sodas    Objective:  Vital Signs:  There were no vitals taken for this visit.  General:*** General appearance: Awake and alert. No distress. Cooperative with exam.  Skin: No obvious rash or jaundice. HEENT: Atraumatic. Anicteric. Lungs: Non-labored breathing on room air  Heart: Regular Abdomen: Soft, non tender. Extremities: No edema. No obvious deformity.  Musculoskeletal: No obvious joint swelling.  Neurological: Mental Status: Alert. Speech fluent. No pseudobulbar affect Cranial Nerves: CNII: No RAPD. Visual fields intact. CNIII, IV, VI: PERRL. No nystagmus. EOMI. CN V: Facial sensation intact bilaterally to fine touch. Masseter clench strong. Jaw jerk***. CN VII: Facial muscles symmetric and strong. No ptosis at rest or after sustained upgaze***. CN VIII: Hears finger rub well bilaterally. CN IX: No hypophonia. CN X: Palate elevates symmetrically. CN XI: Full strength shoulder shrug bilaterally. CN XII: Tongue protrusion full and midline. No atrophy or fasciculations. No significant dysarthria*** Motor: Tone is ***. *** fasciculations in *** extremities. *** atrophy. No grip or percussive myotonia.  Individual muscle group testing (MRC grade out of 5):  Movement     Neck flexion ***    Neck extension ***     Right Left   Shoulder abduction *** ***   Shoulder adduction *** ***   Shoulder ext rotation *** ***   Shoulder int rotation *** ***   Elbow flexion *** ***    Elbow extension *** ***   Wrist extension *** ***   Wrist flexion *** ***   Finger abduction - FDI *** ***   Finger abduction - ADM *** ***   Finger extension *** ***   Finger distal flexion - 2/3 *** ***   Finger distal flexion - 4/5 *** ***   Thumb flexion - FPL *** ***   Thumb abduction - APB *** ***    Hip flexion *** ***   Hip extension *** ***   Hip adduction *** ***   Hip abduction *** ***   Knee extension *** ***   Knee flexion *** ***   Dorsiflexion *** ***   Plantarflexion *** ***   Inversion *** ***   Eversion *** ***   Great toe extension *** ***   Great toe flexion *** ***     Reflexes:  Right Left  Bicep *** ***  Tricep *** ***  BrRad *** ***  Knee *** ***  Ankle *** ***   Pathological Reflexes: Babinski: *** response bilaterally*** Hoffman: *** Troemner: *** Pectoral: *** Palmomental: *** Facial: *** Midline tap: *** Sensation: Pinprick: *** Vibration: *** Temperature: *** Proprioception: *** Coordination: Intact finger-to- nose-finger and heel-to-shin bilaterally. Romberg  negative.*** Gait: Able to rise from chair with arms crossed unassisted. Normal, narrow-based gait. Able to tandem walk. Able to walk on toes and heels.***   Lab and Test Review: New results: 04/01/23: CMP unremarkable CBC w/ diff significant for Hb 10.9 (Chronic), MCV 95.9  CT head and cervical spine wo contrast (04/01/23): FINDINGS: CT HEAD FINDINGS   Brain:   Generalized parenchymal atrophy.   There is no acute intracranial hemorrhage.   No demarcated cortical infarct.   No extra-axial fluid collection.   No evidence of an intracranial mass.   No midline shift.   Vascular: No hyperdense vessel.  Atherosclerotic calcifications.   Skull: No calvarial fracture or aggressive osseous lesion.   Sinuses/Orbits: No mass or acute finding within the imaged orbits. Mild mucosal thickening scattered within bilateral ethmoid air cells. 12 mm mucous retention cyst  within the right sphenoid sinus.   Other: Small left periorbital hematoma.   CT CERVICAL SPINE FINDINGS   Alignment: Levocurvature of the cervical spine. Nonspecific straightening of the expected cervical lordosis. 2 mm C4-C5 grade 1 anterolisthesis. 3 mm C5-C6 grade 1 anterolisthesis. 3 mm C7-T1 grade 1 anterolisthesis. 2 mm T1-T2 grade 1 anterolisthesis.   Skull base and vertebrae: The basion-dental and atlanto-dental intervals are maintained.No evidence of acute fracture to the cervical spine.   Soft tissues and spinal canal: No prevertebral fluid or swelling. No visible canal hematoma.   Disc levels: Cervical spondylosis with multilevel disc space narrowing, disc bulges/central disc protrusions, posterior disc osteophyte complexes, uncovertebral hypertrophy and facet arthrosis. Disc space narrowing is greatest at C4-C5, C5-C6, C6-C7 and T2-T3 (advanced at these levels). Multilevel spinal canal stenosis most notably as follows. At C4-C5, a central disc protrusion contributes to multifactorial spinal canal stenosis which is at least moderate (and may be severe). At C5-C6, a disc protrusion contributes to multifactorial spinal canal stenosis which appears severe. At C6-C7, a posterior disc osteophyte complex contributes to multifactorial spinal canal stenosis which appears moderate. Multilevel bony neural foraminal narrowing.   Upper chest: No consolidation within the imaged lung apices. Biapical pleuroparenchymal scarring.   IMPRESSION: CT head:   1.  No evidence of an acute intracranial abnormality. 2. Small left periorbital hematoma. 3. Generalized parenchymal atrophy. 4. Paranasal sinus disease at the imaged levels as described.   CT cervical spine:   1. No evidence of an acute cervical spine fracture. 2. Nonspecific straightening of the expected cervical lordosis. 3. Grade 1 anterolisthesis at C4-C5, C5-C6, C7-T1 and T1-T2. 4. Cervical spondylosis as described.  Spinal canal stenosis is greatest at C4-C5 (at least moderate and possibly severe), C5-C6 (severe) and C6-C7 (moderate). Multilevel bony neural foraminal narrowing.  CT lumbar spine w/ contrast (04/13/23): FINDINGS: LUMBAR MYELOGRAM FINDINGS:   There are 5 non-rib-bearing lumbar type vertebrae. Anterolisthesis of L4 on L5 and L5 on S1 does not significantly change with flexion or extension, although range of motion appears limited. L4-S1 fusion is noted. Small ventral extradural defects are present at L2-3 and L3-4. There is no evidence of high-grade spinal stenosis. There is evidence of asymmetric right lateral recess narrowing at L2-3, partially effacing the right L3 nerve root.   CT LUMBAR MYELOGRAM FINDINGS:   Anterolisthesis of L4 on L5 and L5 on S1 measure 11-12 mm each. There is trace retrolisthesis of L2 on L3. Alignment is unchanged from the prior MRI. No acute fracture is identified.   Sequelae of L4-S1 posterior fusion are again identified. There is diffuse lucency along both L4 pedicle screws with the  left-sided screw breaching the cortex of the L4 superior endplate. There is solid osseous fusion across the disc space and posterior elements at L5-S1. At L4-5, there is at most a small amount of bridging bone across the posterior elements. There is subsidence of the interbody spacer at L4-5 without solid interbody osseous fusion.   The conus medullaris terminates at L1-2. There is abdominal aortic atherosclerosis without aneurysm. A focus of coarse calcification and associated scarring are noted in the posterior right kidney.   T12-L1: Negative.   L1-2: Small left paracentral disc extrusion with slight superior migration without stenosis, unchanged.   L2-3: Circumferential disc bulging, a small right subarticular disc protrusion, and mild facet and ligamentum flavum hypertrophy result in moderate right and mild left lateral recess stenosis and mild bilateral neural  foraminal stenosis without spinal stenosis, unchanged.   L3-4: Mild disc bulging and moderate facet and ligamentum flavum hypertrophy result in mild left greater than right neural foraminal stenosis without spinal stenosis, unchanged.   L4-5: Prior posterior decompression and fusion. Anterolisthesis with bulging of uncovered disc, severe posterior disc space height loss, endplate spurring, and facet arthrosis result in moderate to severe bilateral neural foraminal stenosis without significant spinal stenosis, likely unchanged.   L5-S1: Prior posterior decompression and fusion. Anterolisthesis with disc uncovering, endplate spurring, and disc height loss result in mild left greater than right neural foraminal stenosis. There is mild residual spinal stenosis superior to the disc level.   IMPRESSION: 1. L4-S1 fusion with solid osseous fusion at L5-S1. 2. Evidence of loosening of the L4 pedicle screws with absence of solid arthrodesis at L4-5 and moderate to severe bilateral neural foraminal stenosis. 3. Unchanged moderate right lateral recess stenosis at L2-3.  Previously reviewed results: 07/15/22: Vit E wnl B12: 1495 Copper wnl IFE: no M protein   06/28/22: CK: 341 (stable for 15 years) BNP: 180.0 (elevated) CBC: anemic to Hb of 10.3 CMP: Na 130, albumin mildly low at 3.3   HbA1c (09/22/21): 6.4 B12 (07/23/19): > 7500 TSH (07/23/19): 0.706   CT head wo contrast (06/28/22): FINDINGS: Brain: No evidence of acute infarction, hemorrhage, hydrocephalus, extra-axial collection or mass lesion/mass effect. Sequela of mild chronic microvascular ischemic change.   Vascular: No hyperdense vessel or unexpected calcification.   Skull: Normal. Negative for fracture or focal lesion.   Sinuses/Orbits: No middle ear or mastoid effusion. Paranasal sinuses are notable for mucosal thickening in the bilateral sphenoid and ethmoid sinuses. Orbits are unremarkable.   Other: None.    IMPRESSION: No acute intracranial abnormality.   MRI cervical spine wo contrast (06/28/22): FINDINGS: Alignment: Degenerative anterolisthesis at C2-3, C4-5, C5-6, and C7-T1. At C5-6 slip measures 4 mm.   Vertebrae: Mild marrow edema at the right C5-6 facet. No fracture or aggressive bone lesion   Cord: Degenerative cord deformity described below. No visible cord edema.   Posterior Fossa, vertebral arteries, paraspinal tissues: Negative for perispinal mass or inflammation   Disc levels:   C2-3: Mild facet spurring and disc bulging   C3-4: Disc narrowing and bulging with uncovertebral and facet spurring eccentric to the left. Moderate left foraminal impingement   C4-5: Degenerative facet spurring with anterolisthesis. Disc height loss and bulging with uncovertebral ridging. Mild spinal stenosis and cord indentation. Mild bilateral foraminal narrowing   C5-6: Disc collapse with endplate and uncovertebral ridging. Degenerative facet spurring on both sides with anterolisthesis. Moderate spinal stenosis with cord flattening. Moderate biforaminal stenosis   C6-7: Disc narrowing and bulging with left paracentral protrusion. Uncovertebral spurring  on both sides. The canal and foramina are patent   C7-T1:Degenerative facet spurring on both sides. Small left paracentral protrusion and an annular fissure. The canal and foramina are patent   IMPRESSION: 1. No acute finding.  No visible cord injury. 2. Advanced and generalized cervical spine degeneration with multilevel listhesis that is progressed from 2021. 3. Up to moderate spinal stenosis with cord indentation at C5-6. 4. Foraminal narrowings described above.   MRI brain w/wo contrast (05/28/21): FINDINGS: Brain: No acute or subacute infarction, hemorrhage, hydrocephalus, extra-axial collection or mass lesion. Generalized cerebral volume loss. Small remote left cerebral infarct. Minor chronic small vessel ischemic change in  the hemispheric white matter and pons. No abnormal enhancement.   Vascular: Preserved flow voids and vascular enhancements.   Skull and upper cervical spine: No focal marrow lesion or visible fracture deformity. Benign in stable heterogeneity of marrow in the skull base and upper cervical spine.   Sinuses/Orbits: Chronic patchy sinus opacification including T1 hyperintense (proteinaceous type) material in the bilateral sinuses, especially ethmoids.   IMPRESSION: 1. No acute or subacute insult. Stable senescent changes when compared to 2021. 2. Chronic ethmoid sinusitis.   MRI lumbar spine wo contrast (07/24/19): FINDINGS: Segmentation:  Standard   Alignment:  Grade 1 anterolisthesis at L5-S1, unchanged   Vertebrae: Diffusely heterogeneous bone marrow signal is unchanged since prior examination. No fracture. L5-S1 posterior instrument fusion.   Conus medullaris and cauda equina: Conus extends to the L1 level. Conus and cauda equina appear normal.   Paraspinal and other soft tissues: Negative   Disc levels:   T10-L1 levels are imaged only in the sagittal plane but are normal.   L1-2: Disc desiccation without herniation. No stenosis.   L2-3: Mild facet hypertrophy with small disc bulge. No spinal canal or neural foraminal stenosis.   L3-4: Disc desiccation and small central disc protrusion. Mild facet hypertrophy. No spinal canal or neural foraminal stenosis.   L4-5: Unchanged small central disc protrusion with narrowing of both lateral recesses. No central spinal canal stenosis. Unchanged severe bilateral neural foraminal stenosis.   L5-S1: Grade 1 anterolisthesis. Posterior fusion with decompression. Thecal sac is widely patent. Unchanged severe bilateral neural foraminal stenosis.   IMPRESSION: 1. L5-S1 posterior instrumented fusion and decompression with unchanged severe bilateral neural foraminal stenosis. 2. L4-L5 central disc protrusion with narrowing of  both lateral recesses and unchanged severe bilateral neural foraminal stenosis. 3. Unchanged diffusely heterogeneous bone marrow signal.   MRI thoracic spine wo contrast (07/24/19): IMPRESSION: Motion degraded exam.   There is nonspecific diffuse heterogeneity of marrow signal throughout the thoracic spine.   No thoracic compression deformity. Moderate/severe motion degradation of the sagittal STIR sequence limits evaluation for marrow edema or focal osseous lesions.   Progressive mild-to-moderate disc degeneration throughout the thoracic spine as compared to MRI 02/20/2013.   Thoracic spondylosis is otherwise similar to this prior exam. No more than mild spinal canal stenosis at any level. At T3-T4, a small right center disc protrusion may contact the ventral spinal cord. No significant foraminal narrowing at any level.   No spinal cord signal abnormality is identified within limitations of motion degradation.  MRI thoracic and lumbar spine wo contrast (07/27/22): FINDINGS: MRI THORACIC SPINE FINDINGS   Alignment:  Normal   Vertebrae: Very heterogeneous marrow signal but not significantly changed since 2021. The could be due to smoking, anemia, osteoporosis or chronic marrow disorder. No fractures or worrisome bone lesions. No abnormal STIR signal intensity.   Cord:  Normal cord  signal intensity.  No cord lesions or syrinx.   Paraspinal and other soft tissues: No significant paraspinal findings.   Disc levels:   T1-2: Mild annular bulge but no significant spinal or foraminal stenosis.   T3-4: Small central disc protrusion with mild impression on the ventral thecal sac but no significant canal stenosis or foraminal stenosis.   The other intervertebral disc spaces are unremarkable. No spinal or foraminal stenosis.   MRI LUMBAR SPINE FINDINGS   Segmentation: There are five lumbar type vertebral bodies. The last full intervertebral disc space is labeled L5-S1.    Alignment: Lumbar fusion hardware at L4-5 and L5-S1. The L4-5 fusion hardware is new since the prior study. Significant anterolisthesis of L4 measuring approximately 10 mm. This measured approximately 5 mm on the prior study. Stable anterolisthesis of L5 estimated at 7 mm.   Vertebrae: Very heterogeneous marrow signal as noted in the thoracic spine but unchanged.   Conus medullaris and cauda equina: Conus extends to the L1 level. Conus and cauda equina appear normal.   Paraspinal and other soft tissues: No significant paraspinal retroperitoneal findings.   Disc levels:   T12-L1: No significant findings.   L1-2: Diffuse annular bulge and very shallow paracentral disc protrusion and mild facet disease with mild bilateral lateral recess encroachment but no significant spinal or foraminal stenosis.   L2-3: Diffuse bulging and uncovered disc along with facet disease and ligamentum flavum thickening contributing to mild underlying spinal and bilateral lateral recess stenosis, right greater than left. There is also a shallow right paracentral disc osteophyte complex with mass effect the right L3 nerve root in the recess. This is a progressive finding.   L3-4: No disc protrusions, spinal or foraminal stenosis. Moderate facet disease. Wide decompressive laminectomy.   L4-5: Posterior and interbody fusion hardware. Wide decompressive laminectomy without obvious spinal foraminal stenosis.   L5-S1: Exam limited by artifact. Stable fusion hardware with anterolisthesis and severe facet disease. Wide decompressive laminectomy. No obvious spinal foraminal stenosis   IMPRESSION: 1. Small central disc protrusion at T3-4 with mild impression on the ventral thecal sac but no significant canal or foraminal stenosis. 2. Very heterogeneous marrow signal but not significantly changed since 2021. The could be due to smoking, anemia, osteoporosis or chronic marrow disorder. 3. Wide  decompressive laminectomies at L3-4, L4-5 and L5-S1. No obvious spinal foraminal stenosis. 4. Progressive degenerative disc disease and facet disease at L2-3. There is mild spinal and bilateral lateral recess stenosis, right greater than left. There is also a shallow right paracentral disc osteophyte complex with mass effect on the right L3 nerve root in the recess.  ASSESSMENT: This is Ronald Ply., a 78 y.o. male with:  ***  Plan: ***  Return to clinic in ***  Total time spent reviewing records, interview, history/exam, documentation, and coordination of care on day of encounter:  *** min  Jacquelyne Balint, MD

## 2023-05-17 DIAGNOSIS — M5416 Radiculopathy, lumbar region: Secondary | ICD-10-CM | POA: Diagnosis not present

## 2023-05-24 ENCOUNTER — Encounter: Payer: Self-pay | Admitting: Neurology

## 2023-05-24 ENCOUNTER — Ambulatory Visit: Payer: Medicare Other | Admitting: Neurology

## 2023-06-13 DIAGNOSIS — F3130 Bipolar disorder, current episode depressed, mild or moderate severity, unspecified: Secondary | ICD-10-CM | POA: Diagnosis not present

## 2023-06-14 DIAGNOSIS — M5416 Radiculopathy, lumbar region: Secondary | ICD-10-CM | POA: Diagnosis not present

## 2023-07-13 IMAGING — MR MR HEAD WO/W CM
12 series · 48 of 48 positions shown · IV contrast (multihance)
Comparison: Head CT from 10 days ago.  Brain MRI 07/24/2019

CLINICAL DATA: Fall with posterior head injury.

EXAM:
MRI HEAD WITHOUT AND WITH CONTRAST
TECHNIQUE: Multiplanar, multiecho pulse sequences of the brain and surrounding
structures were obtained without and with intravenous contrast.
CONTRAST:  20mL MULTIHANCE GADOBENATE DIMEGLUMINE 529 MG/ML IV SOLN

[Series 2: T1 · sagittal · 5.0mm · 0.49mm/px · 3 of 26 slices shown]
[im 1/26]
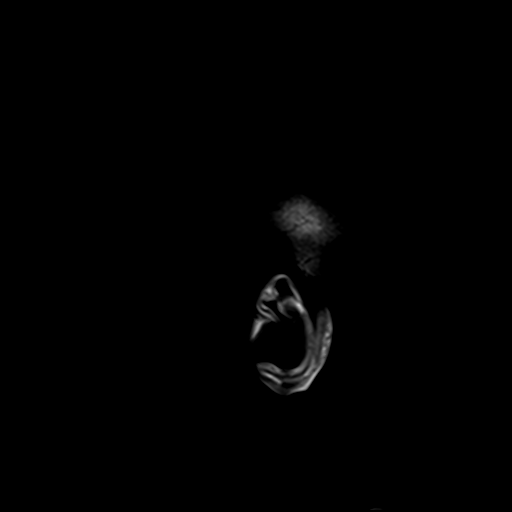
[im 13/26]
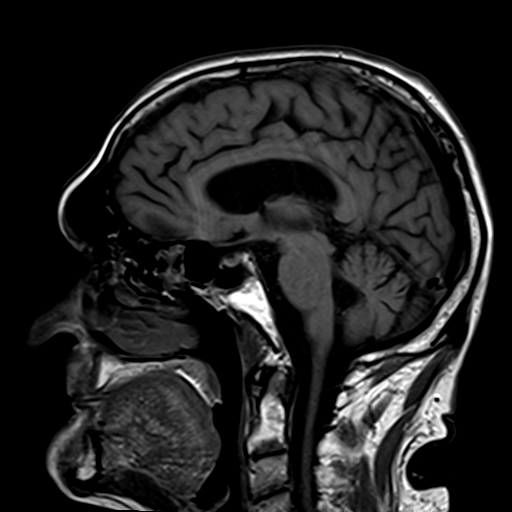
[im 26/26]
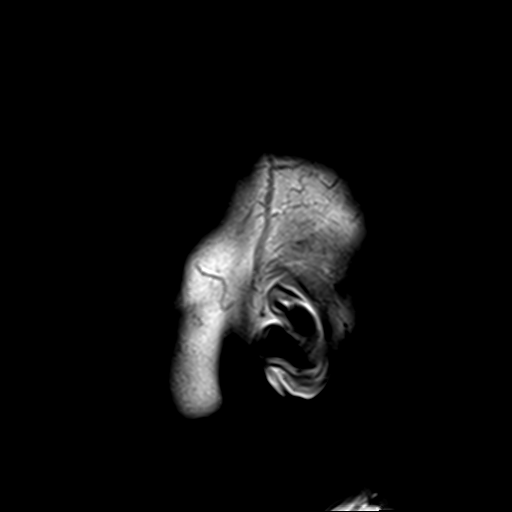

[Series 3: DWI · axial · 3.0mm · 1.95mm/px · z∈[-66,+90]mm · 6 of 106 slices shown (1 of 4)]
[im 1/106]
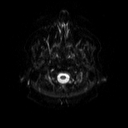
[im 22/106]
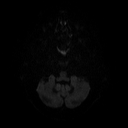
[im 43/106]
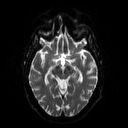
[im 64/106]
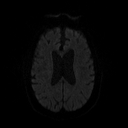
[im 85/106]
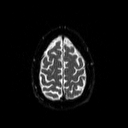
[im 106/106]
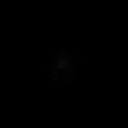

[Series 4: DWI · axial · 3.0mm · 1.95mm/px · z∈[-66,+90]mm · 3 of 51 slices shown (2 of 4)]
[im 1/51]
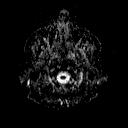
[im 26/51]
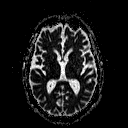
[im 51/51]
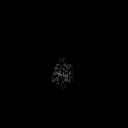

[Series 5: DWI · coronal · 5.0mm · 1.80mm/px · 5 of 82 slices shown (3 of 4)]
[im 1/82]
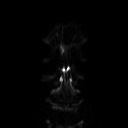
[im 21/82]
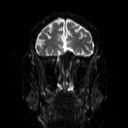
[im 41/82]
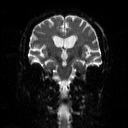
[im 61/82]
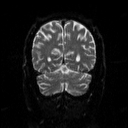
[im 82/82]
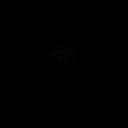

[Series 6: DWI · coronal · 5.0mm · 1.80mm/px · 2 of 41 slices shown (4 of 4)]
[im 1/41]
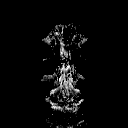
[im 41/41]
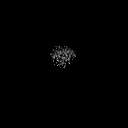

[Series 7: T2 · axial · 5.0mm · 0.78mm/px · 1 of 24 slices shown (1 of 2)]
[im 1/24]
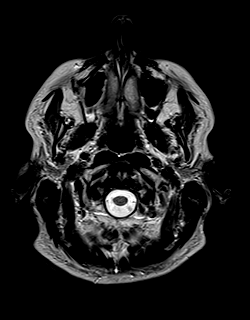

[Series 8: FLAIR · axial · 3.0mm · 0.49mm/px · z∈[-68,+89]mm · 2 of 35 slices shown]
[im 1/35]
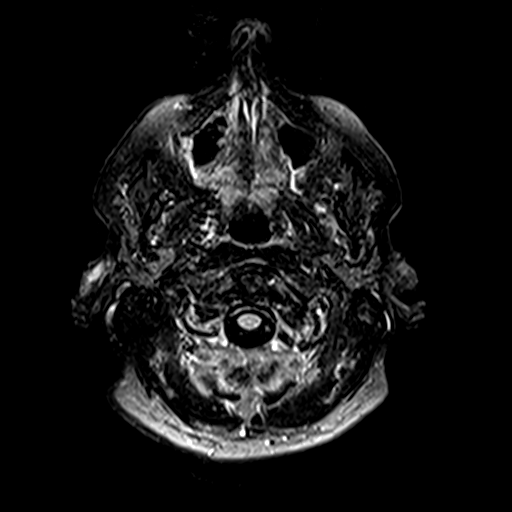
[im 35/35]
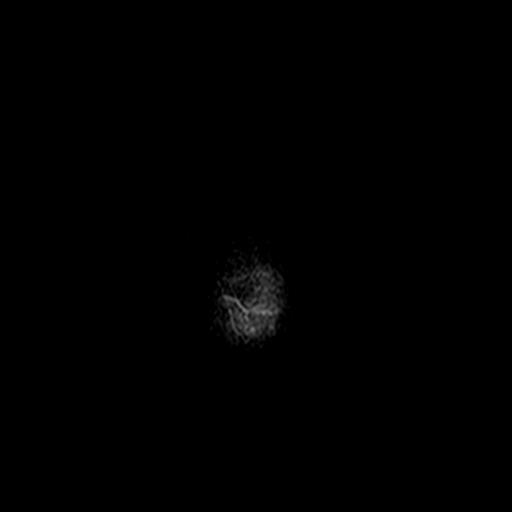

[Series 10: swi_images · axial · 4.0mm · 0.98mm/px · z∈[-67,+88]mm · 2 of 40 slices shown]
[im 1/40]
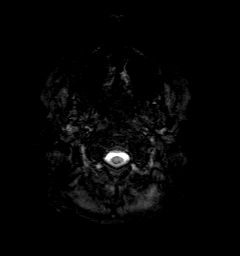
[im 40/40]
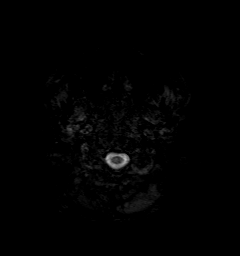

[Series 11: t1_mpr_tra · axial · 1.0mm · 0.78mm/px · z∈[-68,+90]mm · 10 of 160 slices shown]
[im 1/160]
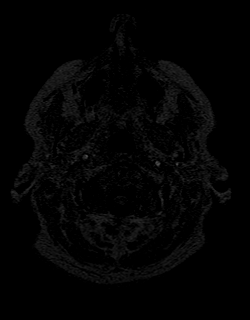
[im 18/160]
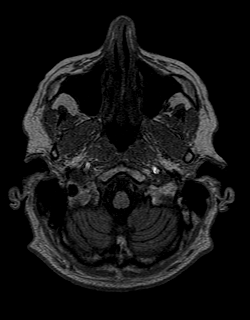
[im 36/160]
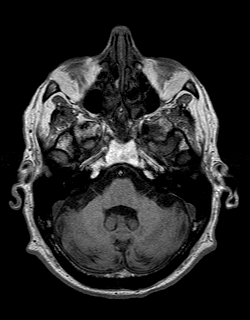
[im 54/160]
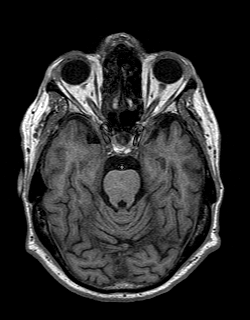
[im 71/160]
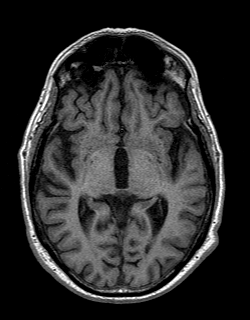
[im 89/160]
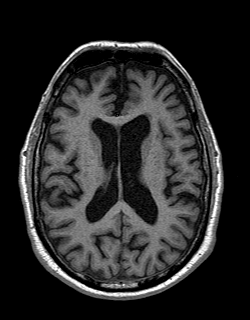
[im 107/160]
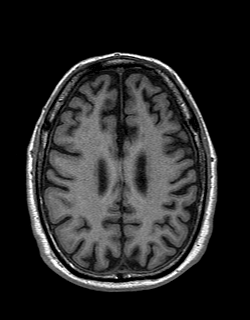
[im 124/160]
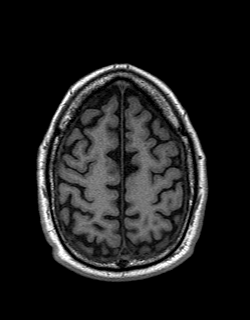
[im 142/160]
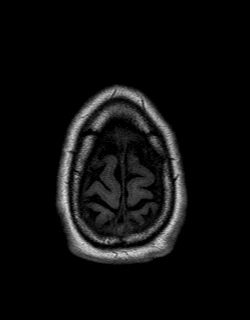
[im 160/160]
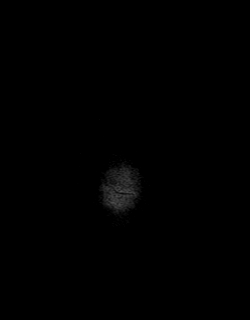

[Series 13: T2 · coronal · 5.0mm · 0.45mm/px · 2 of 33 slices shown (2 of 2)]
[im 1/33]
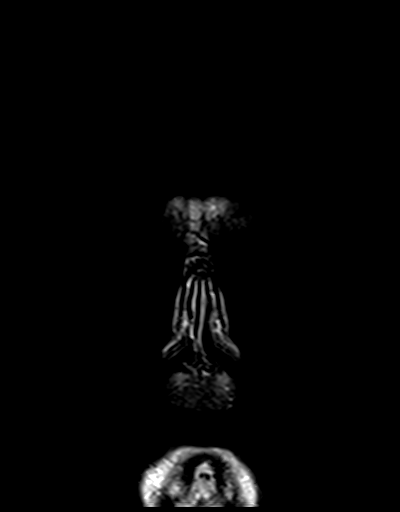
[im 33/33]
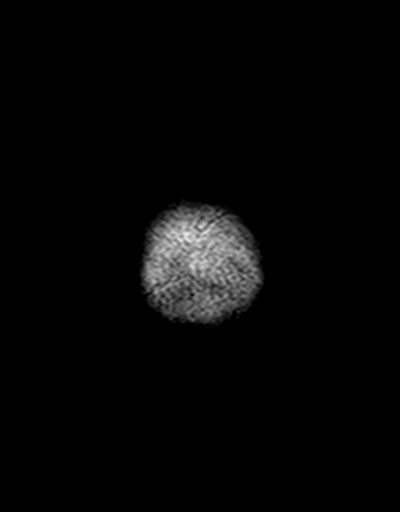

[Series 14: t1_mpr_tra post · axial · 1.0mm · 0.78mm/px · z∈[-59,+99]mm · 10 of 160 slices shown]
[im 1/160]
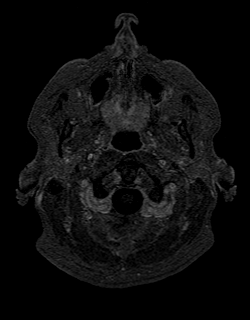
[im 18/160]
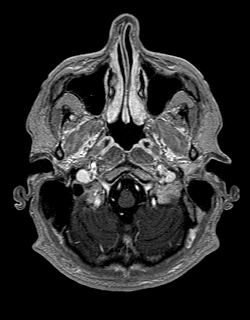
[im 36/160]
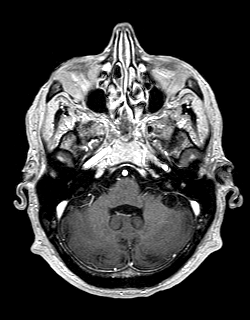
[im 54/160]
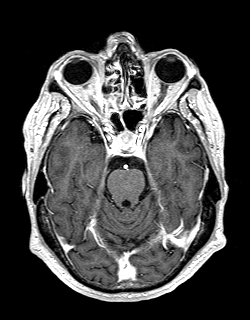
[im 71/160]
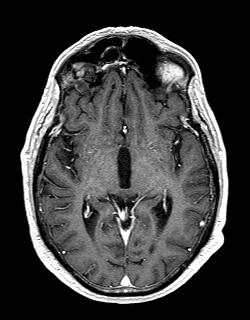
[im 89/160]
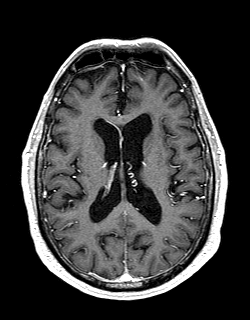
[im 107/160]
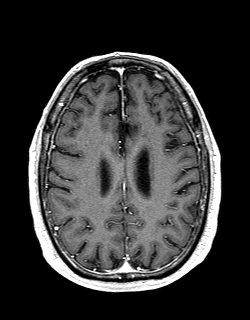
[im 124/160]
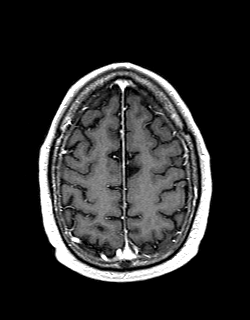
[im 142/160]
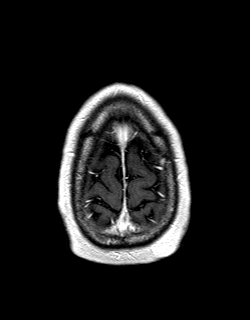
[im 160/160]
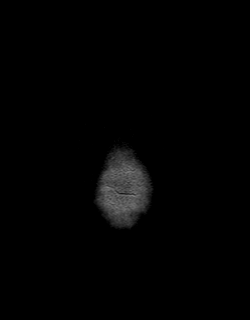

[Series 15: post cor · coronal · 5.0mm · 0.45mm/px · 2 of 33 slices shown]
[im 1/33]
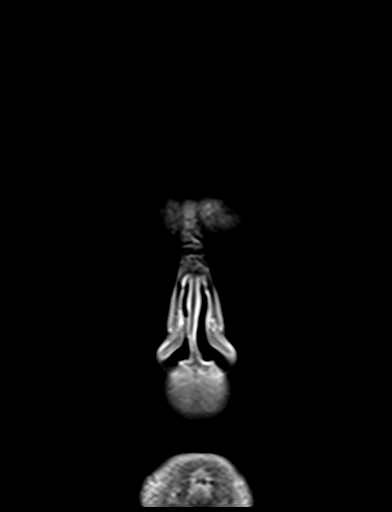
[im 33/33]
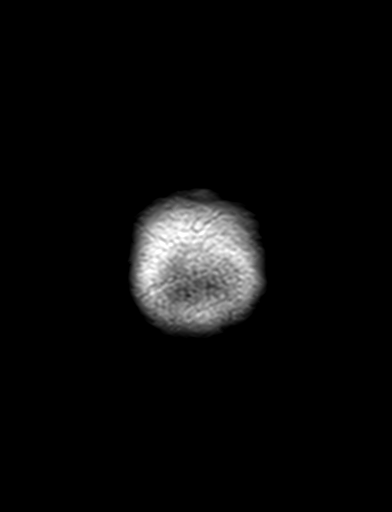

[48 of 48 positions shown; findings below may reference images not displayed]

FINDINGS: Brain: No acute or subacute infarction, hemorrhage, hydrocephalus,
extra-axial collection or mass lesion. Generalized cerebral volume
loss. Small remote left cerebral infarct. Minor chronic small vessel
ischemic change in the hemispheric white matter and pons. No
abnormal enhancement.

Vascular: Preserved flow voids and vascular enhancements.

Skull and upper cervical spine: No focal marrow lesion or visible
fracture deformity. Benign in stable heterogeneity of marrow in the
skull base and upper cervical spine.

Sinuses/Orbits: Chronic patchy sinus opacification including T1
hyperintense (proteinaceous type) material in the bilateral sinuses,
especially ethmoids.
IMPRESSION: 1. No acute or subacute insult. Stable senescent changes when
compared to 2621.
2. Chronic ethmoid sinusitis.

## 2023-08-24 DIAGNOSIS — G894 Chronic pain syndrome: Secondary | ICD-10-CM | POA: Diagnosis not present

## 2023-08-24 DIAGNOSIS — R269 Unspecified abnormalities of gait and mobility: Secondary | ICD-10-CM | POA: Diagnosis not present

## 2023-08-24 DIAGNOSIS — I951 Orthostatic hypotension: Secondary | ICD-10-CM | POA: Diagnosis not present

## 2023-08-24 DIAGNOSIS — E7800 Pure hypercholesterolemia, unspecified: Secondary | ICD-10-CM | POA: Diagnosis not present

## 2023-08-24 DIAGNOSIS — N3020 Other chronic cystitis without hematuria: Secondary | ICD-10-CM | POA: Diagnosis not present

## 2023-08-24 DIAGNOSIS — E1140 Type 2 diabetes mellitus with diabetic neuropathy, unspecified: Secondary | ICD-10-CM | POA: Diagnosis not present

## 2023-08-24 DIAGNOSIS — N401 Enlarged prostate with lower urinary tract symptoms: Secondary | ICD-10-CM | POA: Diagnosis not present

## 2023-08-24 DIAGNOSIS — I700 Atherosclerosis of aorta: Secondary | ICD-10-CM | POA: Diagnosis not present

## 2023-08-24 DIAGNOSIS — D509 Iron deficiency anemia, unspecified: Secondary | ICD-10-CM | POA: Diagnosis not present

## 2023-08-24 DIAGNOSIS — I2510 Atherosclerotic heart disease of native coronary artery without angina pectoris: Secondary | ICD-10-CM | POA: Diagnosis not present

## 2023-08-24 DIAGNOSIS — J849 Interstitial pulmonary disease, unspecified: Secondary | ICD-10-CM | POA: Diagnosis not present

## 2023-08-24 DIAGNOSIS — F3342 Major depressive disorder, recurrent, in full remission: Secondary | ICD-10-CM | POA: Diagnosis not present

## 2023-09-11 DIAGNOSIS — F3130 Bipolar disorder, current episode depressed, mild or moderate severity, unspecified: Secondary | ICD-10-CM | POA: Diagnosis not present

## 2023-09-14 DIAGNOSIS — R110 Nausea: Secondary | ICD-10-CM | POA: Diagnosis not present

## 2023-09-14 DIAGNOSIS — D509 Iron deficiency anemia, unspecified: Secondary | ICD-10-CM | POA: Diagnosis not present

## 2023-09-14 DIAGNOSIS — I2510 Atherosclerotic heart disease of native coronary artery without angina pectoris: Secondary | ICD-10-CM | POA: Diagnosis not present

## 2023-09-14 DIAGNOSIS — R634 Abnormal weight loss: Secondary | ICD-10-CM | POA: Diagnosis not present

## 2023-09-14 DIAGNOSIS — R531 Weakness: Secondary | ICD-10-CM | POA: Diagnosis not present

## 2023-09-14 DIAGNOSIS — E1140 Type 2 diabetes mellitus with diabetic neuropathy, unspecified: Secondary | ICD-10-CM | POA: Diagnosis not present

## 2023-09-18 ENCOUNTER — Encounter: Payer: Self-pay | Admitting: Internal Medicine

## 2023-09-18 ENCOUNTER — Telehealth: Payer: Self-pay | Admitting: Cardiovascular Disease

## 2023-09-18 DIAGNOSIS — D509 Iron deficiency anemia, unspecified: Secondary | ICD-10-CM | POA: Diagnosis not present

## 2023-09-18 DIAGNOSIS — R634 Abnormal weight loss: Secondary | ICD-10-CM | POA: Diagnosis not present

## 2023-09-18 NOTE — Telephone Encounter (Signed)
 Symptoms are nonspecific. Need to check for worsenng orthostatic hypotension, but may not be a cardiac issue

## 2023-09-18 NOTE — Telephone Encounter (Signed)
 Pt c/o Shortness Of Breath: STAT if SOB developed within the last 24 hours or pt is noticeably SOB on the phone  1. Are you currently SOB (can you hear that pt is SOB on the phone)? No   2. How long have you been experiencing SOB? one month  3. Are you SOB when sitting or when up moving around? Both   4. Are you currently experiencing any other symptoms?  Nausea, dizzy, fatigue

## 2023-09-18 NOTE — Telephone Encounter (Signed)
 Pt called to report that he has been having dizziness, fatigue and just does not feel well.... he saw his PCP and he sent him up with GI for a colonoscopy and EGD this Friday due to his recent anemia.   I made him an appt with an APP 09/29/23... his BP at his GI appt yesterday was 112/68 and HR 64... he has been eating and drinking well.   He will call if he develops any worsening symptoms.. I will send to Dr Francyne grout his review.

## 2023-09-19 NOTE — Telephone Encounter (Signed)
 Added orthostatics to appointment notes.

## 2023-09-22 ENCOUNTER — Other Ambulatory Visit: Payer: Self-pay | Admitting: Gastroenterology

## 2023-09-22 DIAGNOSIS — K319 Disease of stomach and duodenum, unspecified: Secondary | ICD-10-CM | POA: Diagnosis not present

## 2023-09-22 DIAGNOSIS — Z9884 Bariatric surgery status: Secondary | ICD-10-CM | POA: Diagnosis not present

## 2023-09-22 DIAGNOSIS — R634 Abnormal weight loss: Secondary | ICD-10-CM | POA: Diagnosis not present

## 2023-09-22 DIAGNOSIS — D509 Iron deficiency anemia, unspecified: Secondary | ICD-10-CM

## 2023-09-26 ENCOUNTER — Telehealth: Payer: Self-pay

## 2023-09-26 ENCOUNTER — Ambulatory Visit
Admission: RE | Admit: 2023-09-26 | Discharge: 2023-09-26 | Disposition: A | Discharge status: Home / Self Care | Source: Ambulatory Visit | Attending: Gastroenterology | Admitting: Gastroenterology

## 2023-09-26 DIAGNOSIS — D509 Iron deficiency anemia, unspecified: Secondary | ICD-10-CM

## 2023-09-26 DIAGNOSIS — K4090 Unilateral inguinal hernia, without obstruction or gangrene, not specified as recurrent: Secondary | ICD-10-CM | POA: Diagnosis not present

## 2023-09-26 DIAGNOSIS — K319 Disease of stomach and duodenum, unspecified: Secondary | ICD-10-CM | POA: Diagnosis not present

## 2023-09-26 DIAGNOSIS — N200 Calculus of kidney: Secondary | ICD-10-CM | POA: Diagnosis not present

## 2023-09-26 MED ORDER — IOPAMIDOL (ISOVUE-300) INJECTION 61%
100.0000 mL | Freq: Once | INTRAVENOUS | Status: AC | PRN
  Administered 2023-09-26: 100 mL via INTRAVENOUS

## 2023-09-26 NOTE — Telephone Encounter (Signed)
 Dagoberto RN from [facility] just reached out to have a IV Iron infusion set up at [facility] location for patient. Made aware that patient has not been to this location before. Dagoberto RN stated that this patient previously saw Dr. Federico at [facility]. Redirected to call [facility] to speak with providers nurse to further discuss. Understood, messaged sent to providers nurse to inform.

## 2023-09-29 ENCOUNTER — Ambulatory Visit: Admitting: Physician Assistant

## 2023-09-29 DIAGNOSIS — M791 Myalgia, unspecified site: Secondary | ICD-10-CM | POA: Diagnosis not present

## 2023-09-29 DIAGNOSIS — Z1330 Encounter for screening examination for mental health and behavioral disorders, unspecified: Secondary | ICD-10-CM | POA: Diagnosis not present

## 2023-09-29 DIAGNOSIS — M5416 Radiculopathy, lumbar region: Secondary | ICD-10-CM | POA: Diagnosis not present

## 2023-09-29 DIAGNOSIS — G894 Chronic pain syndrome: Secondary | ICD-10-CM | POA: Diagnosis not present

## 2023-10-02 NOTE — Progress Notes (Addendum)
 Cardiology Office Note   Date:  10/04/2023  ID:  Ronald Doty., DOB Jan 18, 1946, MRN [id number] PCP: Ransom Other, MD  [facility] Providers Cardiologist:  Jerel Balding, MD   History of Present Illness Ronald Boettcher. is a 78 y.o. male with known moderate CAD managed medically, mitral valve prolapse of the anterior leaflet with mild to moderate mitral insufficiency, diabetes mellitus, hyperlipidemia, symptomatic PVCs, pulmonary fibrosis, aortic atherosclerosis here for follow-up appointment.  History includes previously being on CPAP of but had not needed after weight loss.  Mild cervical spine stenosis.  Started on midodrine  for orthostatic hypotension by his neurologist Dr. Leigh.  Activity is limited by back pain but also by lack of desire to exercise.  History of bilateral hip and bilateral knee replacements as well as previous lumbar spine surgery.  Managed to keep off the excess weight and his BMI is around 25.  This is led to marked improvement in his sleep and he no longer uses CPAP.  He did not complain of much dizziness and has not had syncope or near syncope.  Rarely has palpitations.  Denies orthopnea, PND, and lower extremity edema.  Multiple PVCs were seen on previous EKG although not at his last office visit in December.  They have been monomorphic, left bundle branch block morphology and might have RVOT origin.  Patient denied any chest pain at rest or exertional, dyspnea, orthopnea, PND, syncope, focal neural defects, intermittent claudication, lower extremity edema, unexplained weight gain, cough, hemoptysis or wheezing.  Most recent hemoglobin A1c was 6.4%.  LDL is still in the 50s per his recollection.  Today, he presents with mitral valve prolapse and coronary artery disease with increased fatigue and dizziness. He is accompanied by his wife. He was referred by Dr. Husain for evaluation of his heart condition and persistent anemia.  He experiences  increased fatigue and dizziness, worsening over time, with episodes of nausea and disorientation. These episodes often include a sensation of involuntary walking to the left or right. He has more frequent premature ventricular contractions, describing his heart rhythm as 'da, Futures trader, boom, Futures trader, Futures trader, boom'.  He has a persistent anemia with ongoing fatigue, dizziness, and nausea. Despite multiple consultations, the anemia remains unresolved. 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  His sleep apnea resolved after significant weight loss following gastric bypass surgery. He no longer snores or experiences apneic episodes. His diabetes is well-controlled, with recent A1c levels between 5.5 and 5.8, and he was taken off metformin  on July 3rd, 2025.  He has a history of high cholesterol, with recent labs showing an LDL of 92. He takes 40 mg of pravastatin . In the past LDL has been at goal.   Reports no shortness of breath nor dyspnea on exertion. Reports no chest pain, pressure, or tightness. No edema, orthopnea, PND.   Discussed the use of AI scribe software for clinical note transcription with the patient, who gave verbal consent to proceed.   ROS: pertinent ROS in HPI  Studies Reviewed EKG Interpretation Date/Time:  Wednesday October 04 2023 14:30:14 EDT Ventricular Rate:  65 PR Interval:  178 QRS Duration:  144 QT Interval:  446 QTC Calculation: 463 R Axis:   -35  Text Interpretation: Sinus rhythm with occasional Premature ventricular complexes Left axis deviation Right bundle branch block Minimal voltage criteria for LVH,  may be normal variant ( R in aVL ) When compared with ECG of 01-Apr-2023 11:18, PREVIOUS ECG IS PRESENT Confirmed by Lucien Blanc [phone]) on 10/04/2023 4:55:58 PM    Echocardiogram 2017 - Left ventricle: The cavity size was  normal. Wall thickness was    increased in a pattern of mild LVH. Systolic function was    vigorous. The estimated ejection fraction was in the range of 65%    to 70%. Wall motion was normal; there were no regional wall    motion abnormalities. The study is not technically sufficient to    allow evaluation of LV diastolic function.  - Mitral valve: There was mild regurgitation.  - Left atrium: The atrium was mildly dilated.    High resolution chest CT November 05, 2019 1. 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 Stable mild patchy air trapping in both lungs. 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 Fibrotic NSIP is on the differential. Findings are suggestive of an alternative diagnosis (not UIP) per consensus guidelines: Diagnosis of Idiopathic Pulmonary Fibrosis: An Official ATS/ERS/JRS/ALAT Clinical Practice Guideline. Am JINNY [employer] Crit Care Med Vol 198, Iss 5, [phone], Nov 12 2016. 2. Three-vessel coronary atherosclerosis. 3. Small pericardial effusion/thickening, slightly increased. 4. Aortic Atherosclerosis (ICD10-I70.0) and Emphysema (ICD10-J43.9).   Physical Exam VS:  BP 125/74   Pulse (!) 58   Ht 6' 2 (1.88 m)   Wt 197 lb (89.4 kg)   SpO2 96%   BMI 25.29 kg/m   Orthostatic VS for the past 24 hrs (Last 3 readings):  BP- Lying Pulse- Lying BP- Sitting Pulse- Sitting BP- Standing at 0 minutes Pulse- Standing at 0 minutes BP- Standing at 3 minutes Pulse- Standing at 3 minutes  10/04/23 1413 126/72 58 112/69 61 114/74 71 118/72 73      Wt Readings from Last 3 Encounters:  10/04/23 197 lb (89.4 kg)  04/01/23 200 lb (90.7 kg)  02/22/23 200 lb (90.7 kg)    GEN: Well nourished, well developed in no acute distress NECK: No JVD; No carotid bruits CARDIAC: RRR, no murmurs, rubs, gallops RESPIRATORY:   Clear to auscultation without rales, wheezing or rhonchi  ABDOMEN: Soft, non-tender, non-distended EXTREMITIES:  No edema; No deformity   ASSESSMENT AND PLAN  Mitral valve prolapse/MR Mitral valve prolapse with potential worsening. Last echocardiogram in 2017 requires repeat to assess valve and heart function. Surgical intervention may be necessary if severe. - Order echocardiogram to assess mitral valve function and heart pump function.  Symptomatic premature ventricular contractions (PVCs) Increased frequency of symptomatic PVCs. Potential underlying issues include worsening valve function or coronary disease. - Order echocardiogram to assess heart pump function. - We decided on a PET/CT instead of coronary CT due to frequent PVCs on exam  Coronary artery disease/aortic atherosclerosis  Coronary artery disease with increased fatigue and decreased exercise tolerance. Last stress test in 2017. CT scan with contrast preferred for detailed coronary artery evaluation. - Order PET/CT for further evaluation  Anemia Chronic anemia with fatigue, dizziness, and disorientation. History of low iron and ferritin levels. Scheduled hematology evaluation for potential iron infusion.  Hyperlipidemia LDL cholesterol increased to 92 mg/dL from previously below 70 mg/dL. Recheck lipid panel before medication changes. Currently on pravastatin  40 mg daily. - Recheck lipid panel in a few months to confirm LDL level.  Orthostatic hypotension -not diagnostic values today -on midodrine , encouraged to continue  Informed Consent   Shared Decision Making/Informed Consent{  The risks [chest pain, shortness of breath, cardiac arrhythmias, dizziness, blood pressure fluctuations, myocardial infarction, stroke/transient ischemic attack, nausea, vomiting, allergic reaction, radiation exposure, metallic taste sensation and life-threatening complications (estimated to be 1 in 10,000)], benefits (risk stratification,  diagnosing coronary artery disease, treatment guidance) and alternatives of a cardiac PET/CT stress test were discussed in detail with [name] and he agrees to proceed.     Dispo: He can follow-up after testing  Signed, Orren LOISE Fabry, PA-C

## 2023-10-04 ENCOUNTER — Encounter: Payer: Self-pay | Admitting: Physician Assistant

## 2023-10-04 ENCOUNTER — Ambulatory Visit: Attending: Physician Assistant | Admitting: Physician Assistant

## 2023-10-04 VITALS — BP 125/74 | HR 58 | Ht 74.0 in | Wt 197.0 lb

## 2023-10-04 DIAGNOSIS — G4733 Obstructive sleep apnea (adult) (pediatric): Secondary | ICD-10-CM | POA: Diagnosis not present

## 2023-10-04 DIAGNOSIS — I2510 Atherosclerotic heart disease of native coronary artery without angina pectoris: Secondary | ICD-10-CM | POA: Insufficient documentation

## 2023-10-04 DIAGNOSIS — I341 Nonrheumatic mitral (valve) prolapse: Secondary | ICD-10-CM | POA: Insufficient documentation

## 2023-10-04 DIAGNOSIS — I493 Ventricular premature depolarization: Secondary | ICD-10-CM | POA: Insufficient documentation

## 2023-10-04 DIAGNOSIS — J8410 Pulmonary fibrosis, unspecified: Secondary | ICD-10-CM | POA: Insufficient documentation

## 2023-10-04 DIAGNOSIS — Z8639 Personal history of other endocrine, nutritional and metabolic disease: Secondary | ICD-10-CM | POA: Diagnosis not present

## 2023-10-04 DIAGNOSIS — E7800 Pure hypercholesterolemia, unspecified: Secondary | ICD-10-CM | POA: Diagnosis not present

## 2023-10-04 DIAGNOSIS — I700 Atherosclerosis of aorta: Secondary | ICD-10-CM | POA: Diagnosis not present

## 2023-10-04 DIAGNOSIS — D509 Iron deficiency anemia, unspecified: Secondary | ICD-10-CM | POA: Diagnosis not present

## 2023-10-04 DIAGNOSIS — I340 Nonrheumatic mitral (valve) insufficiency: Secondary | ICD-10-CM | POA: Insufficient documentation

## 2023-10-04 NOTE — Patient Instructions (Addendum)
 Medication Instructions:  Your physician recommends that you continue on your current medications as directed. Please refer to the Current Medication list given to you today.  *If you need a refill on your cardiac medications before your next appointment, please call your pharmacy*  Lab Work:  LIPIDS AND LFTS SOME TIME THIS WEEK, YOU CAN GO TO ANY LAB [facility], IF YOU CHOSE TO GO TO OURS, IT IS ON THE FIRST FLOOR, PLEASE COME FASTING. NOTHING TO EAT OR DRINK, EXCEPT WATER  OR BLACK COFFEE  If you have labs (blood work) drawn today and your tests are completely normal, you will receive your results only by: MyChart Message (if you have MyChart) OR A paper copy in the mail If you have any lab test that is abnormal or we need to change your treatment, we will call you to review the results.  Testing/Procedures: Your physician has requested that you have an echocardiogram. Echocardiography is a painless test that uses sound waves to create images of your heart. It provides your doctor with information about the size and shape of your heart and how well your heart's chambers and valves are working. This procedure takes approximately one hour. There are no restrictions for this procedure. Please do NOT wear cologne, perfume, aftershave, or lotions (deodorant is allowed). Please arrive 15 minutes prior to your appointment time.  Please note: We ask at that you not bring children with you during ultrasound (echo/ vascular) testing. Due to room size and safety concerns, children are not allowed in the ultrasound rooms during exams. Our front office staff cannot provide observation of children in our lobby area while testing is being conducted. An adult accompanying a patient to their appointment will only be allowed in the ultrasound room at the discretion of the ultrasound technician under special circumstances. We apologize for any inconvenience.       Please report to Radiology at the [facility] Main Entrance 30 minutes early for your test.  [address] [place], [place] [address]    How to Prepare for Your Cardiac PET/CT Stress Test:  Nothing to eat or drink, except water , 3 hours prior to arrival time.  NO caffeine/decaffeinated products, or chocolate 12 hours prior to arrival. (Please note decaffeinated beverages (teas/coffees) still contain caffeine).  If you have caffeine within 12 hours prior, the test will need to be rescheduled.  Medication instructions: Do not take erectile dysfunction medications for 72 hours prior to test (sildenafil, tadalafil) Do not take nitrates (isosorbide mononitrate, Ranexa) the day before or day of test   Diabetic Preparation: If able to eat breakfast prior to 3 hour fasting, you may take all medications, including your insulin . Do not worry if you miss your breakfast dose of insulin  - start at your next meal. If you do not eat prior to 3 hour fast-Hold all diabetes (oral and insulin ) medications. Patients who wear a continuous glucose monitor MUST remove the device prior to scanning.  You may take your remaining medications with water .  NO perfume, cologne or lotion on chest or abdomen area.   Total time is 1 to 2 hours; you may want to bring reading material for the waiting time.    In preparation for your appointment, medication and supplies will be purchased.  Appointment availability is limited, so if you need to cancel or reschedule, please call the Radiology Department Scheduler at [phone] 24 hours in advance to avoid a cancellation fee of $100.00  What to Expect When  you Arrive:  Once you arrive and check in for your appointment, you will be taken to a preparation room within the Radiology Department.  A technologist or Nurse will obtain your medical history, verify that you are correctly prepped for the exam, and explain the procedure.  Afterwards, an IV will be started in your arm and electrodes will be placed  on your skin for EKG monitoring during the stress portion of the exam. Then you will be escorted to the PET/CT scanner.  There, staff will get you positioned on the scanner and obtain a blood pressure and EKG.  During the exam, you will continue to be connected to the EKG and blood pressure machines.  A small, safe amount of a radioactive tracer will be injected in your IV to obtain a series of pictures of your heart along with an injection of a stress agent.    After your Exam:  It is recommended that you eat a meal and drink a caffeinated beverage to counter act any effects of the stress agent.  Drink plenty of fluids for the remainder of the day and urinate frequently for the first couple of hours after the exam.  Your doctor will inform you of your test results within 7-10 business days.  For more information and frequently asked questions, please visit our website: [email or web address]  For questions about your test or how to prepare for your test, please call: Cardiac Imaging Nurse Navigators Office: [phone]     Follow-Up:  3 MONTHS WITH DR. FRANCYNE

## 2023-10-06 ENCOUNTER — Inpatient Hospital Stay (HOSPITAL_BASED_OUTPATIENT_CLINIC_OR_DEPARTMENT_OTHER): Admitting: Hematology and Oncology

## 2023-10-06 ENCOUNTER — Inpatient Hospital Stay: Attending: Hematology and Oncology

## 2023-10-06 VITALS — BP 119/74 | HR 56 | Temp 97.4°F | Resp 14 | Wt 197.1 lb

## 2023-10-06 DIAGNOSIS — D649 Anemia, unspecified: Secondary | ICD-10-CM | POA: Insufficient documentation

## 2023-10-06 DIAGNOSIS — Z87891 Personal history of nicotine dependence: Secondary | ICD-10-CM | POA: Diagnosis not present

## 2023-10-06 DIAGNOSIS — Z9884 Bariatric surgery status: Secondary | ICD-10-CM

## 2023-10-06 LAB — CMP (CANCER CENTER ONLY)
ALT: 15 U/L (ref 0–44)
AST: 17 U/L (ref 15–41)
Albumin: 3.5 g/dL (ref 3.5–5.0)
Alkaline Phosphatase: 70 U/L (ref 38–126)
Anion gap: 4 — ABNORMAL LOW (ref 5–15)
BUN: 16 mg/dL (ref 8–23)
CO2: 27 mmol/L (ref 22–32)
Calcium: 8.4 mg/dL — ABNORMAL LOW (ref 8.9–10.3)
Chloride: 109 mmol/L (ref 98–111)
Creatinine: 0.73 mg/dL (ref 0.61–1.24)
GFR, Estimated: >60 mL/min (ref >60)
Glucose, Bld: 111 mg/dL — ABNORMAL HIGH (ref 70–99)
Potassium: 4.1 mmol/L (ref 3.5–5.1)
Sodium: 140 mmol/L (ref 135–145)
Total Bilirubin: 0.3 mg/dL (ref 0.0–1.2)
Total Protein: 6.7 g/dL (ref 6.5–8.1)

## 2023-10-06 LAB — CBC WITH DIFFERENTIAL (CANCER CENTER ONLY)
Abs Immature Granulocytes: 0.02 K/uL (ref 0.00–0.07)
Basophils Absolute: 0.1 K/uL (ref 0.0–0.1)
Basophils Relative: 2 %
Eosinophils Absolute: 0.6 K/uL — ABNORMAL HIGH (ref 0.0–0.5)
Eosinophils Relative: 8 %
HCT: 29.6 % — ABNORMAL LOW (ref 39.0–52.0)
Hemoglobin: 9.4 g/dL — ABNORMAL LOW (ref 13.0–17.0)
Immature Granulocytes: 0 %
Lymphocytes Relative: 16 %
Lymphs Abs: 1.2 K/uL (ref 0.7–4.0)
MCH: 29.7 pg (ref 26.0–34.0)
MCHC: 31.8 g/dL (ref 30.0–36.0)
MCV: 93.7 fL (ref 80.0–100.0)
Monocytes Absolute: 0.6 K/uL (ref 0.1–1.0)
Monocytes Relative: 8 %
Neutro Abs: 4.7 K/uL (ref 1.7–7.7)
Neutrophils Relative %: 66 %
Platelet Count: 267 K/uL (ref 150–400)
RBC: 3.16 MIL/uL — ABNORMAL LOW (ref 4.22–5.81)
RDW: 13.7 % (ref 11.5–15.5)
WBC Count: 7.2 K/uL (ref 4.0–10.5)
nRBC: 0 % (ref 0.0–0.2)

## 2023-10-06 LAB — IRON AND IRON BINDING CAPACITY (CC-WL,HP ONLY)
Iron: 37 ug/dL — ABNORMAL LOW (ref 45–182)
Saturation Ratios: 11 % — ABNORMAL LOW (ref 17.9–39.5)
TIBC: 339 ug/dL (ref 250–450)
UIBC: 302 ug/dL (ref 117–376)

## 2023-10-06 LAB — VITAMIN B12: Vitamin B-12: >7500 pg/mL — ABNORMAL HIGH (ref 180–914)

## 2023-10-06 LAB — RETIC PANEL
Immature Retic Fract: 18 % — ABNORMAL HIGH (ref 2.3–15.9)
RBC.: 3.17 MIL/uL — ABNORMAL LOW (ref 4.22–5.81)
Retic Count, Absolute: 44.4 K/uL (ref 19.0–186.0)
Retic Ct Pct: 1.4 % (ref 0.4–3.1)
Reticulocyte Hemoglobin: 28.2 pg (ref >27.9)

## 2023-10-06 LAB — SAMPLE TO BLOOD BANK

## 2023-10-06 LAB — LACTATE DEHYDROGENASE: LDH: 265 U/L — ABNORMAL HIGH (ref 98–192)

## 2023-10-06 LAB — FOLATE: Folate: 12.8 ng/mL (ref >5.9)

## 2023-10-06 LAB — FERRITIN: Ferritin: 25 ng/mL (ref 24–336)

## 2023-10-06 NOTE — Progress Notes (Signed)
 [facility] [phone]   [phone]  INITIAL CONSULT NOTE  Patient Care Team: Ransom Other, MD as PCP - General (Internal Medicine) Francyne Headland, MD as PCP - Cardiology (Cardiology)  Hematological/Oncological History # Normocytic Anemia  # Anemia in Setting of Gastric Bypass 04/01/2023: White blood cell 6.3, hemoglobin 10.9, MCV 95.9, platelets 273 10/06/2023: establish care with Dr. Federico   CHIEF COMPLAINTS/PURPOSE OF CONSULTATION:  Normocytic Anemia    HISTORY OF PRESENTING ILLNESS:  [name]. 78 y.o. male with medical history significant for asthma, BPH, pre-type 2 diabetes, and OSA improved with weight loss who presents for evaluation of a normocytic anemia in the setting of gastric bypass.  On review of the previous records [name] has a history of a Roux-en-Y gastric bypass performed in 2008.  He has had consistent anemia over the last 10 years with his last normal hemoglobin being in January 2015.  His most recent labs on 04/01/2023 showed White blood cell 6.3, hemoglobin 10.9, MCV 95.9, platelets 273.  Due to concern for his normocytic anemia he was referred to hematology for further evaluation and management.  On exam today [name] reports that he feels stable despite the fact that his hemoglobin has been consistently low.  He reports that he received IV iron therapy with Dr. Layla many years ago but has not received any iron therapy since.  He reports that he has been feeling fatigued with some occasional dizziness and has been pale.  He notes he is not having any trouble with bleeding, bruising, or dark stools.  He notes that he has not undergone colonoscopy as his [facility] prefer to do a CT scan.  He notes that everything looked good on that scan.  On further discussion he reports his mom and dad both passed away of an MI as that his maternal grandmother.  He has no children but does have 2 dogs.  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  He reports he otherwise has had no recent changes in his health.  He denies any fevers, chills, sweats, nausea, vomiting or diarrhea.  A full 10 point ROS is otherwise negative.  MEDICAL HISTORY:  Past Medical History:  Diagnosis Date   Arthritis    Asthma    anxiety, tree/grass pollen   BPH (benign prostatic hyperplasia)    Bronchitis    hx of   Bulge of cervical disc without myelopathy    Chest pain    a. 06/2015 MV: EF 56%, no ischemia/infarct.   Complication of anesthesia 04/01/2013   no complications but just says he typically needs more than expected to anesthetize; needs CPAP (setting 10) in recovery   Depression    ADD   Insomnia    Low blood pressure    Mitral valve prolapse    a. 06/2015 Echo: EF 65-70%, mild LVH, no rwma, mild MVP of ant leaflet and mild to mod posteriorly directed MR, mildly dil La.   Obesity    Pre-diabetes    PVC's (premature ventricular contractions)    Recurrent upper respiratory infection (URI)    states Dr Melodi aware- no fever- states is improving   Sleep apnea    no longer uses cpap due to weight loss   Tremor    d/t lithium     SURGICAL HISTORY: Past Surgical History:  Procedure Laterality Date  CARDIAC CATHETERIZATION     2008 pre op gastric bypass   COLONOSCOPY W/ POLYPECTOMY     GASTRIC BYPASS  2008   Roux en y   JOINT REPLACEMENT     Left, Right Knee, Right hip   KNEE ARTHROSCOPY     right x 3-4, left x 2   KNEE ARTHROTOMY  ~1991   right   LUMBAR FUSION  04/03/2013   Dr Joshua, L5-S1   POLYPECTOMY     vocal cords 1992   REVERSE SHOULDER ARTHROPLASTY Right 09/27/2021   Procedure: REVERSE SHOULDER ARTHROPLASTY;  Surgeon: Kay Kemps, MD;  Location: [facility];  Service: Orthopedics;  Laterality: Right;  with ISB   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   Procedure: LEFT TOTAL HIP ARTHROPLASTY  ANTERIOR APPROACH;  Surgeon: Donnice Car, MD;  Location: [facility];  Service: Orthopedics;  Laterality: Left;   TOTAL HIP REVISION  04/22/2011   Procedure: TOTAL HIP REVISION;  Surgeon: Dempsey LULLA Moan, MD;  Location: [facility];  Service: Orthopedics;  Laterality: Right;    SOCIAL HISTORY: Social History   Socioeconomic History   Marital status: Married    Spouse name: Not on file   Number of children: Not on file   Years of education: college   Highest education level: Not on file  Occupational History   Occupation: Retired Teacher, music: [employer]  Tobacco Use   Smoking status: Former    Current packs/day: 0.00    Average packs/day: 1.5 packs/day for 20.0 years (30.0 ttl pk-yrs)    Types: Cigarettes    Start date: 04/17/1970    Quit date: 04/17/1990    Years since quitting: 33.4   Smokeless tobacco: Never  Vaping Use   Vaping status: Never Used  Substance and Sexual Activity   Alcohol use: No   Drug use: No   Sexual activity: Not on file  Other Topics Concern   Not on file  Social History Narrative   Patient works for [employer]. Patient has masters degree. Patient is married.   Are you right handed or left handed? right   Are you currently employed ?    What is your current occupation?retired   Do you live at home alone?   Who lives with you? wife   What type of home do you live in: 1 story or 2 story? one    Caffeine 1 cup 3 sodas   Social Drivers of Corporate investment banker Strain: Low Risk  (09/29/2023)   [facility]   Overall Financial Resource Strain (CARDIA)    How hard is it for you to pay for the very basics like food, housing, medical care, and heating?: Not hard at all  Food Insecurity: No Food Insecurity (10/06/2023)   Hunger Vital Sign    Worried About Running Out of Food in the Last Year: Never true    Ran Out of Food in the Last Year: Never true  Transportation Needs: No Transportation Needs (10/06/2023)   PRAPARE - Doctor, general practice (Medical): No    Lack of Transportation (Non-Medical): No  Physical Activity: Not on file  Stress: Not on file  Social Connections: Unknown (07/27/2021)   [facility]   Social Network    Social Network: Not on file  Intimate Partner Violence: Not At Risk (10/06/2023)   Humiliation, Afraid, Rape, and Kick questionnaire    Fear of Current or Ex-Partner: No    Emotionally  Abused: No    Physically Abused: No    Sexually Abused: No    FAMILY HISTORY: Family History  Problem Relation Age of Onset   Heart disease Father    Anxiety disorder Mother    Asthma Maternal Grandmother     ALLERGIES:  is allergic to breo ellipta [fluticasone  furoate-vilanterol], demerol  [meperidine ], lamotrigine, and nsaids.  MEDICATIONS:  Current Outpatient Medications  Medication Sig Dispense Refill   trimethoprim (TRIMPEX) 100 MG tablet Take 100 mg by mouth daily.     acetaminophen  (TYLENOL ) 325 MG tablet Take 650 mg by mouth every 6 (six) hours as needed.     albuterol  (VENTOLIN  HFA) 108 (90 Base) MCG/ACT inhaler TAKE 2 PUFFS BY MOUTH EVERY 6 HOURS AS NEEDED FOR WHEEZE OR SHORTNESS OF BREATH 4 g 3   alfuzosin  (UROXATRAL ) 10 MG 24 hr tablet Take 10 mg by mouth daily.     aspirin  EC 81 MG tablet Take 81 mg by mouth 2 (two) times daily.     dutasteride  (AVODART ) 0.5 MG capsule Take 0.5 mg by mouth daily.     Eszopiclone 3 MG TABS Take 3 mg by mouth at bedtime.     finasteride  (PROSCAR ) 5 MG tablet Take 5 mg by mouth daily.     gabapentin  (NEURONTIN ) 400 MG capsule Take 300 mg by mouth 3 (three) times daily.     LORazepam  (ATIVAN ) 0.5 MG tablet Take 0.5 mg by mouth in the morning and at bedtime.     MACROBID 100 MG capsule Take 100 mg by mouth 2 (two) times daily. (Patient not taking: Reported on 10/04/2023)     meclizine  (ANTIVERT ) 25 MG tablet Take 1 tablet (25 mg total) by mouth 3 (three) times daily as needed for dizziness. 30 tablet 0   methocarbamol  (ROBAXIN ) 750 MG  tablet Take 750 mg by mouth 3 (three) times daily.     methylphenidate  27 MG PO TB24 Take 27 mg by mouth daily.     midodrine  (PROAMATINE ) 2.5 MG tablet Take 2.5 mg by mouth 2 (two) times daily.     Multiple Vitamins-Minerals (CENTRUM SILVER ADULT 50+ PO) Take 1 tablet by mouth daily.     ondansetron  (ZOFRAN -ODT) 4 MG disintegrating tablet Take 1 tablet (4 mg total) by mouth every 8 (eight) hours as needed. 20 tablet 0   oxybutynin (DITROPAN) 5 MG tablet Take 5 mg by mouth at bedtime.     Oxycodone  HCl 10 MG TABS Take 10 mg by mouth every 6 (six) hours as needed (pain).     pravastatin  (PRAVACHOL ) 40 MG tablet Take 40 mg by mouth daily.     sertraline  (ZOLOFT ) 100 MG tablet Take 100 mg by mouth every morning.     venlafaxine  XR (EFFEXOR -XR) 75 MG 24 hr capsule Take 150 mg by mouth every morning.     No current facility-administered medications for this visit.    REVIEW OF SYSTEMS:   Constitutional: ( - ) fevers, ( - )  chills , ( - ) night sweats Eyes: ( - ) blurriness of vision, ( - ) double vision, ( - ) watery eyes Ears, nose, mouth, throat, and face: ( - ) mucositis, ( - ) sore throat Respiratory: ( - ) cough, ( - ) dyspnea, ( - ) wheezes Cardiovascular: ( - ) palpitation, ( - ) chest discomfort, ( - ) lower extremity swelling Gastrointestinal:  ( - ) nausea, ( - ) heartburn, ( - ) change in bowel habits Skin: ( - )  abnormal skin rashes Lymphatics: ( - ) new lymphadenopathy, ( - ) easy bruising Neurological: ( - ) numbness, ( - ) tingling, ( - ) new weaknesses Behavioral/Psych: ( - ) mood change, ( - ) new changes  All other systems were reviewed with the patient and are negative.  PHYSICAL EXAMINATION:  Vitals:   10/06/23 0858  BP: 119/74  Pulse: (!) 56  Resp: 14  Temp: (!) 97.4 F (36.3 C)  SpO2: 97%   Filed Weights   10/06/23 0858  Weight: 197 lb 1.6 oz (89.4 kg)    GENERAL: well appearing elderly Caucasian male in NAD  SKIN: Pale skin color, but otherwise texture,  turgor are normal, no rashes or significant lesions EYES: conjunctiva are pink and non-injected, sclera clear LUNGS: clear to auscultation and percussion with normal breathing effort HEART: regular rate & rhythm and no murmurs and no lower extremity edema Musculoskeletal: no cyanosis of digits and no clubbing  PSYCH: alert & oriented x 3, fluent speech NEURO: no focal motor/sensory deficits  LABORATORY DATA:  I have reviewed the data as listed    Latest Ref Rng & Units 10/06/2023   10:00 AM 04/01/2023    1:00 PM 02/22/2023   11:21 AM  CBC  WBC 4.0 - 10.5 K/uL 7.2  6.3  6.7   Hemoglobin 13.0 - 17.0 g/dL 9.4  89.0  89.4   Hematocrit 39.0 - 52.0 % 29.6  35.3  33.0   Platelets 150 - 400 K/uL 267  273  270        Latest Ref Rng & Units 10/06/2023   10:00 AM 04/01/2023    1:00 PM 06/28/2022   10:57 AM  CMP  Glucose 70 - 99 mg/dL 888  89  892   BUN 8 - 23 mg/dL 16  16  17    Creatinine 0.61 - 1.24 mg/dL 9.26  9.15  9.24   Sodium 135 - 145 mmol/L 140  138  130   Potassium 3.5 - 5.1 mmol/L 4.1  4.9  3.9   Chloride 98 - 111 mmol/L 109  107  102   CO2 22 - 32 mmol/L 27  24  24    Calcium  8.9 - 10.3 mg/dL 8.4  9.0  8.2   Total Protein 6.5 - 8.1 g/dL 6.7  7.0  7.0   Total Bilirubin 0.0 - 1.2 mg/dL 0.3  0.3  0.3   Alkaline Phos 38 - 126 U/L 70  65  65   AST 15 - 41 U/L 17  29  28    ALT 0 - 44 U/L 15  24  20       ASSESSMENT & PLAN [name]. 78 y.o. male with medical history significant for asthma, BPH, pre-type 2 diabetes, and OSA improved with weight loss who presents for evaluation of a normocytic anemia in the setting of gastric bypass.  After review of the labs, review of the records, and discussion with the patient the patients findings are most consistent with an iron deficiency anemia in the setting of gastric bypass.  Additionally patient may have vitamin B12 deficiency as well.  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 iron  therapy.  Patient will require IV iron therapy. --We will confirm iron deficiency anemia by ordering iron panel and ferritin as well as reticulocytes, CBC, and CMP --Recommend holding further p.o. iron therapy. --We will plan to proceed with IV iron therapy in order to help bolster the patient's blood counts --Plan for return to clinic in 4 to 6 weeks time after last dose of IV iron   Orders Placed This Encounter  Procedures   CBC with Differential (Cancer Center Only)    Standing Status:   Future    Number of Occurrences:   1    Expiration Date:   10/05/2024   CMP (Cancer Center only)    Standing Status:   Future    Number of Occurrences:   1    Expiration Date:   10/05/2024   Ferritin    Standing Status:   Future    Number of Occurrences:   1    Expiration Date:   10/05/2024   Iron and Iron Binding Capacity ([facility] only)    Standing Status:   Future    Number of Occurrences:   1    Expiration Date:   10/05/2024   Retic Panel    Standing Status:   Future    Number of Occurrences:   1    Expiration Date:   10/05/2024   Lactate dehydrogenase (LDH)    Standing Status:   Future    Number of Occurrences:   1    Expiration Date:   10/05/2024   Vitamin B12    Standing Status:   Future    Number of Occurrences:   1    Expiration Date:   10/05/2024   Methylmalonic acid, serum    Standing Status:   Future    Number of Occurrences:   1    Expiration Date:   10/05/2024   Folate, Serum    Standing Status:   Future    Number of Occurrences:   1    Expiration Date:   10/05/2024   Multiple Myeloma Panel (SPEP&IFE w/QIG)    Standing Status:   Future    Number of Occurrences:   1     Expiration Date:   10/05/2024   Kappa/lambda light chains    Standing Status:   Future    Number of Occurrences:   1    Expiration Date:   10/05/2024   Sample to Blood Bank    Standing Status:   Future    Number of Occurrences:   1    Expiration Date:   10/05/2024    All questions were answered. The patient knows to call the clinic with any problems, questions or concerns.  A total of more than 60 minutes were spent on this encounter with face-to-face time and non-face-to-face time, including preparing to see the patient, ordering tests and/or medications, counseling the patient and coordination of care as outlined above.   Norleen IVAR Kidney, MD Department of Hematology/Oncology [facility] Phone: [phone] Pager: [phone] Email: [email or web address]  10/08/2023 8:14 PM

## 2023-10-08 ENCOUNTER — Encounter: Payer: Self-pay | Admitting: Adult Health

## 2023-10-08 ENCOUNTER — Encounter: Payer: Self-pay | Admitting: Hematology and Oncology

## 2023-10-09 ENCOUNTER — Encounter: Payer: Self-pay | Admitting: Hematology and Oncology

## 2023-10-09 ENCOUNTER — Other Ambulatory Visit (HOSPITAL_COMMUNITY): Payer: Self-pay | Admitting: Hematology and Oncology

## 2023-10-09 ENCOUNTER — Telehealth: Payer: Self-pay

## 2023-10-09 DIAGNOSIS — D508 Other iron deficiency anemias: Secondary | ICD-10-CM

## 2023-10-09 LAB — MULTIPLE MYELOMA PANEL, SERUM
Albumin SerPl Elph-Mcnc: 3.1 g/dL (ref 2.9–4.4)
Albumin/Glob SerPl: 1.1 (ref 0.7–1.7)
Alpha 1: 0.2 g/dL (ref 0.0–0.4)
Alpha2 Glob SerPl Elph-Mcnc: 0.7 g/dL (ref 0.4–1.0)
B-Globulin SerPl Elph-Mcnc: 1.1 g/dL (ref 0.7–1.3)
Gamma Glob SerPl Elph-Mcnc: 1 g/dL (ref 0.4–1.8)
Globulin, Total: 3.1 g/dL (ref 2.2–3.9)
IgA: 346 mg/dL (ref 61–437)
IgG (Immunoglobin G), Serum: 1193 mg/dL (ref 603–1613)
IgM (Immunoglobulin M), Srm: 54 mg/dL (ref 15–143)
Total Protein ELP: 6.2 g/dL (ref 6.0–8.5)

## 2023-10-09 LAB — METHYLMALONIC ACID, SERUM: Methylmalonic Acid, Quantitative: 101 nmol/L (ref 0–378)

## 2023-10-09 LAB — KAPPA/LAMBDA LIGHT CHAINS
Kappa free light chain: 31.7 mg/L — ABNORMAL HIGH (ref 3.3–19.4)
Kappa, lambda light chain ratio: 1.16 (ref 0.26–1.65)
Lambda free light chains: 27.4 mg/L — ABNORMAL HIGH (ref 5.7–26.3)

## 2023-10-09 NOTE — Addendum Note (Signed)
 Addended by: Ma Munoz D on: 10/09/2023 11:16 AM   Modules accepted: Orders

## 2023-10-09 NOTE — Telephone Encounter (Signed)
 Auth Submission: NO AUTH NEEDED Site of care: Site of care: [facility] Payer: Medicare A/B with [place] supplement plan G Medication & CPT/J Code(s) submitted: Monoferric (Ferrci derisomaltose) [phone] Diagnosis Code:  Route of submission (phone, fax, portal): phone  Phone # [phone] Fax # Auth type: Buy/Bill PB Units/visits requested: 1000mg  x 1 dose Reference number: [id number] Approval from: 10/09/23 to 04/13/24   I called [place] [employer] to confirm coverage dates 12/12/20 - no expiration date.

## 2023-10-12 DIAGNOSIS — G4733 Obstructive sleep apnea (adult) (pediatric): Secondary | ICD-10-CM | POA: Diagnosis not present

## 2023-10-12 DIAGNOSIS — Z8639 Personal history of other endocrine, nutritional and metabolic disease: Secondary | ICD-10-CM | POA: Diagnosis not present

## 2023-10-12 DIAGNOSIS — E7800 Pure hypercholesterolemia, unspecified: Secondary | ICD-10-CM | POA: Diagnosis not present

## 2023-10-12 DIAGNOSIS — D509 Iron deficiency anemia, unspecified: Secondary | ICD-10-CM | POA: Diagnosis not present

## 2023-10-12 DIAGNOSIS — J8410 Pulmonary fibrosis, unspecified: Secondary | ICD-10-CM | POA: Diagnosis not present

## 2023-10-12 DIAGNOSIS — I340 Nonrheumatic mitral (valve) insufficiency: Secondary | ICD-10-CM | POA: Diagnosis not present

## 2023-10-12 DIAGNOSIS — I341 Nonrheumatic mitral (valve) prolapse: Secondary | ICD-10-CM | POA: Diagnosis not present

## 2023-10-12 DIAGNOSIS — I2510 Atherosclerotic heart disease of native coronary artery without angina pectoris: Secondary | ICD-10-CM | POA: Diagnosis not present

## 2023-10-12 DIAGNOSIS — I700 Atherosclerosis of aorta: Secondary | ICD-10-CM | POA: Diagnosis not present

## 2023-10-12 DIAGNOSIS — I493 Ventricular premature depolarization: Secondary | ICD-10-CM | POA: Diagnosis not present

## 2023-10-13 ENCOUNTER — Ambulatory Visit: Payer: Self-pay | Admitting: Physician Assistant

## 2023-10-13 DIAGNOSIS — I2510 Atherosclerotic heart disease of native coronary artery without angina pectoris: Secondary | ICD-10-CM

## 2023-10-13 LAB — LIPID PANEL
Chol/HDL Ratio: 3 ratio (ref 0.0–5.0)
Cholesterol, Total: 163 mg/dL (ref 100–199)
HDL: 55 mg/dL (ref >39)
LDL Chol Calc (NIH): 90 mg/dL (ref 0–99)
Triglycerides: 98 mg/dL (ref 0–149)
VLDL Cholesterol Cal: 18 mg/dL (ref 5–40)

## 2023-10-13 LAB — HEPATIC FUNCTION PANEL
ALT: 16 IU/L (ref 0–44)
AST: 23 IU/L (ref 0–40)
Albumin: 3.7 g/dL — ABNORMAL LOW (ref 3.8–4.8)
Alkaline Phosphatase: 80 IU/L (ref 44–121)
Bilirubin Total: <0.2 mg/dL (ref 0.0–1.2)
Bilirubin, Direct: 0.09 mg/dL (ref 0.00–0.40)
Total Protein: 6.3 g/dL (ref 6.0–8.5)

## 2023-10-17 ENCOUNTER — Ambulatory Visit (HOSPITAL_COMMUNITY)
Admission: RE | Admit: 2023-10-17 | Discharge: 2023-10-17 | Disposition: A | Discharge status: Home / Self Care | Source: Ambulatory Visit | Attending: Hematology and Oncology | Admitting: Hematology and Oncology

## 2023-10-17 DIAGNOSIS — D508 Other iron deficiency anemias: Secondary | ICD-10-CM | POA: Insufficient documentation

## 2023-10-17 MED ORDER — SODIUM CHLORIDE 0.9 % IV SOLN
1000.0000 mg | Freq: Once | INTRAVENOUS | Status: AC
  Administered 2023-10-17: 1000 mg via INTRAVENOUS
  Filled 2023-10-17: qty 10

## 2023-10-18 ENCOUNTER — Ambulatory Visit: Payer: Self-pay | Admitting: *Deleted

## 2023-10-18 NOTE — Telephone Encounter (Signed)
-----   Message from Nurse Nena POUR sent at 10/17/2023  5:41 PM EDT ----- Regarding: Results TCT patient - voice mail not named. Left message for him to contact office for results and provided number. Sandi  ----- Message ----- From: Dwayne Almarie LABOR, RN Sent: 10/09/2023   5:30 PM EDT To: Nena CHRISTELLA Bunde, RN   ----- Message ----- From: Federico Norleen ONEIDA MADISON, MD Sent: 10/08/2023   8:16 PM EDT To: Almarie LABOR Dwayne, RN  Please let Mr. Firkus know that his labs are consistent with iron deficiency.  We will proceed with IV iron therapy.  Additionally his vitamin B12 levels are markedly high at over 7500.  I would  recommend that he drop his vitamin B12 dose down to 1000 mcg p.o. daily and we will continue to monitor his levels.  Please also request a clinic visit with us  in approximately 8 weeks time to  reevaluate. ----- Message ----- From: Interface, Lab In [place] Sent: 10/06/2023  10:30 AM EDT To: Norleen ONEIDA Federico MADISON, MD

## 2023-10-19 ENCOUNTER — Encounter: Payer: Self-pay | Admitting: Adult Health

## 2023-10-19 DIAGNOSIS — L853 Xerosis cutis: Secondary | ICD-10-CM | POA: Diagnosis not present

## 2023-10-19 DIAGNOSIS — D509 Iron deficiency anemia, unspecified: Secondary | ICD-10-CM | POA: Diagnosis not present

## 2023-10-19 DIAGNOSIS — R634 Abnormal weight loss: Secondary | ICD-10-CM | POA: Diagnosis not present

## 2023-10-19 MED ORDER — VITAMIN B-12 1000 MCG PO TABS: 1000.0000 ug | ORAL_TABLET | Freq: Every day | ORAL | 3 refills | Status: AC

## 2023-10-19 NOTE — Telephone Encounter (Signed)
 Received call back from pt's wife regarding recent lab results.  Advised that his labs are consistent with iron deficiency. Dr. Federico placed orders for IV iron therapy.  Additionally his vitamin B12 levels are markedly high at over 7500. Dr. Federico recommends that he drop his vitamin B12 dose down to 1000 mcg p.o. daily and we will continue to monitor his levels.  We will schedule a clinic visit and labs with us  in approximately 8 weeks time to  reevaluate. Wife voiced understanding. She requests the B12 1000 mcg to be sent in to his pharmacy in [place]

## 2023-10-19 NOTE — Telephone Encounter (Signed)
-----   Message from Nurse Nena POUR sent at 10/17/2023  5:41 PM EDT ----- Regarding: Results TCT patient - voice mail not named. Left message for him to contact office for results and provided number. Sandi  ----- Message ----- From: Dwayne Almarie LABOR, RN Sent: 10/09/2023   5:30 PM EDT To: Nena CHRISTELLA Bunde, RN   ----- Message ----- From: Federico Norleen ONEIDA MADISON, MD Sent: 10/08/2023   8:16 PM EDT To: Almarie LABOR Dwayne, RN  Please let [name] know that his labs are consistent with iron deficiency.  We will proceed with IV iron therapy.  Additionally his vitamin B12 levels are markedly high at over 7500.  I would  recommend that he drop his vitamin B12 dose down to 1000 mcg p.o. daily and we will continue to monitor his levels.  Please also request a clinic visit with us  in approximately 8 weeks time to  reevaluate. ----- Message ----- From: Interface, Lab In [place] Sent: 10/06/2023  10:30 AM EDT To: Norleen ONEIDA Federico MADISON, MD

## 2023-10-20 DIAGNOSIS — N3941 Urge incontinence: Secondary | ICD-10-CM | POA: Diagnosis not present

## 2023-10-20 DIAGNOSIS — N401 Enlarged prostate with lower urinary tract symptoms: Secondary | ICD-10-CM | POA: Diagnosis not present

## 2023-10-20 DIAGNOSIS — R351 Nocturia: Secondary | ICD-10-CM | POA: Diagnosis not present

## 2023-10-20 DIAGNOSIS — N3020 Other chronic cystitis without hematuria: Secondary | ICD-10-CM | POA: Diagnosis not present

## 2023-10-26 MED ORDER — PRAVASTATIN SODIUM 80 MG PO TABS
80.0000 mg | ORAL_TABLET | Freq: Every evening | ORAL | 3 refills | Status: AC
Start: 2023-10-26 — End: 2024-01-24

## 2023-10-26 NOTE — Telephone Encounter (Signed)
 Per Orren, I have place a new rx for increase pravastatin  80 mg daily

## 2023-10-31 ENCOUNTER — Encounter (HOSPITAL_COMMUNITY): Payer: Self-pay

## 2023-11-02 ENCOUNTER — Ambulatory Visit
Admission: RE | Admit: 2023-11-02 | Discharge: 2023-11-02 | Disposition: A | Discharge status: Home / Self Care | Source: Ambulatory Visit | Attending: Physician Assistant | Admitting: Physician Assistant

## 2023-11-02 DIAGNOSIS — J849 Interstitial pulmonary disease, unspecified: Secondary | ICD-10-CM | POA: Insufficient documentation

## 2023-11-02 DIAGNOSIS — I2510 Atherosclerotic heart disease of native coronary artery without angina pectoris: Secondary | ICD-10-CM | POA: Insufficient documentation

## 2023-11-02 DIAGNOSIS — I341 Nonrheumatic mitral (valve) prolapse: Secondary | ICD-10-CM | POA: Diagnosis not present

## 2023-11-02 DIAGNOSIS — I700 Atherosclerosis of aorta: Secondary | ICD-10-CM | POA: Diagnosis not present

## 2023-11-02 DIAGNOSIS — I340 Nonrheumatic mitral (valve) insufficiency: Secondary | ICD-10-CM | POA: Diagnosis not present

## 2023-11-02 DIAGNOSIS — Z8639 Personal history of other endocrine, nutritional and metabolic disease: Secondary | ICD-10-CM | POA: Diagnosis not present

## 2023-11-02 DIAGNOSIS — I493 Ventricular premature depolarization: Secondary | ICD-10-CM | POA: Insufficient documentation

## 2023-11-02 LAB — NM PET CT CARDIAC PERFUSION MULTI W/ABSOLUTE BLOODFLOW
LV dias vol: 175 mL (ref 62–150)
LV sys vol: 79 mL (ref 4.2–5.8)
MBFR: 1.63
Nuc Rest EF: 55 %
Nuc Stress EF: 55 %
Peak HR: 62 {beats}/min
Rest HR: 55 {beats}/min
Rest MBF: 0.73 ml/g/min
Rest Nuclear Isotope Dose: 23.2 mCi
SRS: 1
SSS: 1
ST Depression (mm): 0 mm
Stress MBF: 1.19 ml/g/min
Stress Nuclear Isotope Dose: 23.2 mCi
TID: 1.03

## 2023-11-02 MED ORDER — REGADENOSON 0.4 MG/5ML IV SOLN
0.4000 mg | Freq: Once | INTRAVENOUS | Status: AC
  Administered 2023-11-02: 0.4 mg via INTRAVENOUS
  Filled 2023-11-02: qty 5

## 2023-11-02 MED ORDER — RUBIDIUM RB82 GENERATOR (RUBYFILL)
25.0000 | PACK | Freq: Once | INTRAVENOUS | Status: AC
  Administered 2023-11-02: 23.23 via INTRAVENOUS

## 2023-11-02 MED ORDER — REGADENOSON 0.4 MG/5ML IV SOLN
INTRAVENOUS | Status: AC
Start: 2023-11-02 — End: 2023-11-02
  Filled 2023-11-02: qty 5

## 2023-11-02 MED ORDER — RUBIDIUM RB82 GENERATOR (RUBYFILL)
25.0000 | PACK | Freq: Once | INTRAVENOUS | Status: AC
  Administered 2023-11-02: 23.15 via INTRAVENOUS

## 2023-11-02 NOTE — Progress Notes (Signed)
 Patient presents for a cardiac PET stress test and tolerated procedure without incident. Patient maintained acceptable vital signs throughout the test and was offered caffeine  after test.  Patient ambulated out of department with a steady gait.

## 2023-11-03 NOTE — Addendum Note (Signed)
 Addended by: MANDA LYLE NOVAK on: 11/03/2023 05:38 PM   Modules accepted: Orders

## 2023-11-10 ENCOUNTER — Ambulatory Visit (HOSPITAL_COMMUNITY)
Admission: RE | Admit: 2023-11-10 | Discharge: 2023-11-10 | Disposition: A | Discharge status: Home / Self Care | Source: Ambulatory Visit | Attending: Physician Assistant | Admitting: Physician Assistant

## 2023-11-10 DIAGNOSIS — I341 Nonrheumatic mitral (valve) prolapse: Secondary | ICD-10-CM | POA: Diagnosis not present

## 2023-11-10 DIAGNOSIS — I340 Nonrheumatic mitral (valve) insufficiency: Secondary | ICD-10-CM | POA: Diagnosis not present

## 2023-11-10 LAB — ECHOCARDIOGRAM COMPLETE
AR max vel: 3.96 cm2
AV Area VTI: 4.39 cm2
AV Area mean vel: 3.83 cm2
AV Mean grad: 4 mmHg
AV Peak grad: 8.1 mmHg
Ao pk vel: 1.42 m/s
Area-P 1/2: 2.43 cm2
MV M vel: 5.54 m/s
MV Peak grad: 122.8 mmHg
Radius: 0.6 cm
S' Lateral: 3 cm

## 2023-11-14 DIAGNOSIS — H52223 Regular astigmatism, bilateral: Secondary | ICD-10-CM | POA: Diagnosis not present

## 2023-11-14 DIAGNOSIS — H2513 Age-related nuclear cataract, bilateral: Secondary | ICD-10-CM | POA: Diagnosis not present

## 2023-11-14 DIAGNOSIS — H524 Presbyopia: Secondary | ICD-10-CM | POA: Diagnosis not present

## 2023-11-14 DIAGNOSIS — R7303 Prediabetes: Secondary | ICD-10-CM | POA: Diagnosis not present

## 2023-11-14 DIAGNOSIS — H5203 Hypermetropia, bilateral: Secondary | ICD-10-CM | POA: Diagnosis not present

## 2023-11-21 DIAGNOSIS — M4727 Other spondylosis with radiculopathy, lumbosacral region: Secondary | ICD-10-CM | POA: Diagnosis not present

## 2023-11-21 DIAGNOSIS — M5416 Radiculopathy, lumbar region: Secondary | ICD-10-CM | POA: Diagnosis not present

## 2023-11-21 DIAGNOSIS — G894 Chronic pain syndrome: Secondary | ICD-10-CM | POA: Diagnosis not present

## 2023-11-24 DIAGNOSIS — R3915 Urgency of urination: Secondary | ICD-10-CM | POA: Diagnosis not present

## 2023-11-28 DIAGNOSIS — F3130 Bipolar disorder, current episode depressed, mild or moderate severity, unspecified: Secondary | ICD-10-CM | POA: Diagnosis not present

## 2023-11-30 DIAGNOSIS — R3915 Urgency of urination: Secondary | ICD-10-CM | POA: Diagnosis not present

## 2023-12-08 DIAGNOSIS — R3915 Urgency of urination: Secondary | ICD-10-CM | POA: Diagnosis not present

## 2023-12-15 DIAGNOSIS — Z23 Encounter for immunization: Secondary | ICD-10-CM | POA: Diagnosis not present

## 2023-12-21 DIAGNOSIS — R350 Frequency of micturition: Secondary | ICD-10-CM | POA: Diagnosis not present

## 2023-12-28 DIAGNOSIS — R350 Frequency of micturition: Secondary | ICD-10-CM | POA: Diagnosis not present

## 2024-01-04 DIAGNOSIS — R3915 Urgency of urination: Secondary | ICD-10-CM | POA: Diagnosis not present

## 2024-01-04 DIAGNOSIS — R350 Frequency of micturition: Secondary | ICD-10-CM | POA: Diagnosis not present

## 2024-01-04 DIAGNOSIS — R351 Nocturia: Secondary | ICD-10-CM | POA: Diagnosis not present

## 2024-01-04 DIAGNOSIS — N401 Enlarged prostate with lower urinary tract symptoms: Secondary | ICD-10-CM | POA: Diagnosis not present

## 2024-01-04 DIAGNOSIS — N3941 Urge incontinence: Secondary | ICD-10-CM | POA: Diagnosis not present

## 2024-01-05 ENCOUNTER — Ambulatory Visit: Attending: Cardiovascular Disease | Admitting: Cardiovascular Disease

## 2024-01-05 ENCOUNTER — Encounter: Payer: Self-pay | Admitting: Cardiovascular Disease

## 2024-01-05 VITALS — BP 100/68 | HR 67 | Ht 74.0 in | Wt 204.7 lb

## 2024-01-05 DIAGNOSIS — J8410 Pulmonary fibrosis, unspecified: Secondary | ICD-10-CM | POA: Insufficient documentation

## 2024-01-05 DIAGNOSIS — I340 Nonrheumatic mitral (valve) insufficiency: Secondary | ICD-10-CM | POA: Insufficient documentation

## 2024-01-05 DIAGNOSIS — D508 Other iron deficiency anemias: Secondary | ICD-10-CM | POA: Diagnosis not present

## 2024-01-05 DIAGNOSIS — I493 Ventricular premature depolarization: Secondary | ICD-10-CM | POA: Diagnosis not present

## 2024-01-05 DIAGNOSIS — I2510 Atherosclerotic heart disease of native coronary artery without angina pectoris: Secondary | ICD-10-CM | POA: Diagnosis not present

## 2024-01-05 DIAGNOSIS — R7303 Prediabetes: Secondary | ICD-10-CM | POA: Diagnosis not present

## 2024-01-05 DIAGNOSIS — I700 Atherosclerosis of aorta: Secondary | ICD-10-CM | POA: Diagnosis not present

## 2024-01-05 DIAGNOSIS — I951 Orthostatic hypotension: Secondary | ICD-10-CM | POA: Insufficient documentation

## 2024-01-05 DIAGNOSIS — E7800 Pure hypercholesterolemia, unspecified: Secondary | ICD-10-CM | POA: Diagnosis not present

## 2024-01-05 MED ORDER — MIDODRINE HCL 5 MG PO TABS: 5.0000 mg | ORAL_TABLET | Freq: Two times a day (BID) | ORAL | 3 refills | Status: AC

## 2024-01-05 NOTE — Progress Notes (Signed)
 Cardiology office note   Date:  01/07/2024   ID:  [name]., DOB 02/02/1946, MRN [id number]  PCP:  Ransom Other, MD  Cardiologist:  Shelley Pooley Electrophysiologist:  None   Evaluation Performed:  Follow-Up Visit  Chief Complaint: CAD, PVCs  History of Present Illness:    [name]. is a 78 y.o. male with known moderate CAD managed medically, mitral valve prolapse of the anterior leaflet with mild to moderate mitral insufficiency, diabetes mellitus, hyperlipidemia, symptomatic PVCs, pulmonary fibrosis, aortic atherosclerosis, history of gastric bypass.  He had a history of OSA, but is no longer on CPAP after weight loss.  He has mild cervical spine stenosis.  Started on midodrine  for orthostatic hypotension by his neurologist Dr. Leigh.  His blood pressure continues to run relatively low, sometimes in the 90s and he often has dizziness especially with changes in position.  His current dose of midodrine  is 2.5 mg twice daily.  He also remains limited by shortness of breath related to pulmonary fibrosis (fortunately this appears to be stable) and lumbar spine disease and pain.  He does not have orthopnea, PND or lower extremity edema.  He has not had near-syncope or syncope with his dizzy spells.  He is not troubled much by palpitations.  He has manage maintain a relatively healthy weight with a BMI of 26.  He has had an extensive cardiac reevaluation this year.  He has not had angina pectoralis, but has had worsening fatigue and dizziness.  He underwent an echocardiogram which showed normal left ventricular systolic function with EF 55 to 60%, but described the apical septal segment and apex as being hypokinetic.  There was only mild mitral insufficiency.  He also had a cardiac PET scan that showed 2 small perfusion abnormalities.  There was an apical defect that was fixed.  Another defect in the mid anterolateral wall was felt to most likely represent artifact.  Myocardial  blood flow reserve was slightly abnormal at 1.6.  Multiple PVCs were seen on previous EKGs, although not today.  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  The PVCs may also have been improved by his weight loss.  His most recent lipid profile 10/12/2023 showed LDL relatively high at 90 and hemoglobin A1c in prediabetes range at 5.8%.  He was unaware of the fact that he should take pravastatin  in the evenings and was taking it with his morning medications.  Other metabolic parameters were good including HDL 55, triglycerides 98 and creatinine 0.73.  He has normal thyroid  function tests.  He remains anemic with a hemoglobin of 9.4 (mostly has been in the 10-11 range this year) with normocytic normochromic indices and without clear explanation, albeit with low normal transference saturation levels.  Cardiac catheterization in 2008 showed a 95% stenosis in a small third diagonal artery branch, with mild plaque elsewhere.  He had a low risk nuclear stress test in April 2017.  Echo in 2017 showed LVEF 65-70% with mild mitral regurgitation.  He does not have congestive heart failure.    Past Medical History:  Diagnosis Date   Arthritis    Asthma    anxiety, tree/grass pollen   BPH (benign prostatic hyperplasia)    Bronchitis    hx of   Bulge of cervical disc without myelopathy    Chest pain    a. 06/2015 MV: EF 56%, no ischemia/infarct.   Complication of anesthesia 04/01/2013   no complications but  just says he typically needs more than expected to anesthetize; needs CPAP (setting 10) in recovery   Depression    ADD   Insomnia    Low blood pressure    Mitral valve prolapse    a. 06/2015 Echo: EF 65-70%, mild LVH, no rwma, mild MVP of ant leaflet and mild to mod posteriorly directed MR, mildly dil La.   Obesity    Pre-diabetes    PVC's (premature ventricular contractions)    Recurrent upper respiratory infection (URI)    states Dr Melodi  aware- no fever- states is improving   Sleep apnea    no longer uses cpap due to weight loss   Tremor    d/t lithium    Past Surgical History:  Procedure Laterality Date   CARDIAC CATHETERIZATION     2008 pre op gastric bypass   COLONOSCOPY W/ POLYPECTOMY     GASTRIC BYPASS  2008   Roux en y   JOINT REPLACEMENT     Left, Right Knee, Right hip   KNEE ARTHROSCOPY     right x 3-4, left x 2   KNEE ARTHROTOMY  ~1991   right   LUMBAR FUSION  04/03/2013   Dr Joshua, L5-S1   POLYPECTOMY     vocal cords 1992   REVERSE SHOULDER ARTHROPLASTY Right 09/27/2021   Procedure: REVERSE SHOULDER ARTHROPLASTY;  Surgeon: Kay Kemps, MD;  Location: [facility];  Service: Orthopedics;  Laterality: Right;  with ISB   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   Procedure: LEFT TOTAL HIP ARTHROPLASTY ANTERIOR APPROACH;  Surgeon: Donnice Car, MD;  Location: [facility];  Service: Orthopedics;  Laterality: Left;   TOTAL HIP REVISION  04/22/2011   Procedure: TOTAL HIP REVISION;  Surgeon: Dempsey LULLA Melodi, MD;  Location: [facility];  Service: Orthopedics;  Laterality: Right;     Current Meds  Medication Sig   acetaminophen  (TYLENOL ) 325 MG tablet Take 650 mg by mouth every 6 (six) hours as needed.   Eszopiclone 3 MG TABS Take 3 mg by mouth at bedtime.   finasteride  (PROSCAR ) 5 MG tablet Take 5 mg by mouth daily.   gabapentin  (NEURONTIN ) 400 MG capsule Take 300 mg by mouth 3 (three) times daily. (Patient taking differently: Take 400 mg by mouth 3 (three) times daily.)   LORazepam  (ATIVAN ) 0.5 MG tablet Take 0.5 mg by mouth in the morning and at bedtime.   meclizine  (ANTIVERT ) 25 MG tablet Take 1 tablet (25 mg total) by mouth 3 (three) times daily as needed for dizziness.   methocarbamol  (ROBAXIN ) 750 MG tablet Take 750 mg by mouth 3 (three) times daily.   methylphenidate  27 MG PO TB24 Take 27 mg by mouth daily.   Multiple Vitamins-Minerals (CENTRUM SILVER ADULT 50+ PO) Take 1 tablet by mouth daily.   omeprazole  (PRILOSEC) 20  MG capsule Take 20 mg by mouth every morning.   oxybutynin (DITROPAN) 5 MG tablet Take 5 mg by mouth at bedtime.   Oxycodone  HCl 10 MG TABS Take 10 mg by mouth every 6 (six) hours as needed (pain).   pravastatin  (PRAVACHOL ) 80 MG tablet Take 1 tablet (80 mg total) by mouth every evening.   sertraline  (ZOLOFT ) 100 MG tablet Take 100 mg by mouth every morning.   triamcinolone  cream (KENALOG ) 0.1 % Apply 1 Application topically daily as needed.   triamcinolone  lotion (KENALOG ) 0.1 % Apply 1 Application topically daily as needed.   trimethoprim (TRIMPEX) 100 MG tablet Take 100 mg by mouth daily.   venlafaxine   XR (EFFEXOR -XR) 75 MG 24 hr capsule Take 150 mg by mouth every morning.   [DISCONTINUED] midodrine  (PROAMATINE ) 2.5 MG tablet Take 2.5 mg by mouth 2 (two) times daily.     Allergies:   Breo ellipta [fluticasone  furoate-vilanterol], Demerol  [meperidine ], and Nsaids   Social History   Tobacco Use   Smoking status: Former    Current packs/day: 0.00    Average packs/day: 1.5 packs/day for 20.0 years (30.0 ttl pk-yrs)    Types: Cigarettes    Start date: 04/17/1970    Quit date: 04/17/1990    Years since quitting: 33.7   Smokeless tobacco: Never  Vaping Use   Vaping status: Never Used  Substance Use Topics   Alcohol use: No   Drug use: No     Family Hx: The patient's family history includes Anxiety disorder in his mother; Asthma in his maternal grandmother; Heart disease in his father.  ROS:   Please see the history of present illness.    All other systems are reviewed and are negative.   Prior CV studies:   The following studies were reviewed today:  Echocardiogram 11/10/2023 1. Left ventricular ejection fraction, by estimation, is 55 to 60%. The  left ventricle has normal function. The left ventricle demonstrates  regional wall motion abnormalities (see scoring diagram/findings for  description). Left ventricular diastolic  parameters are consistent with Grade I diastolic  dysfunction (impaired  relaxation).   2. Right ventricular systolic function is normal. The right ventricular  size is normal.   3. Left atrial size was severely dilated.   4. The mitral valve is normal in structure. Mild mitral valve  regurgitation. No evidence of mitral stenosis.   5. The aortic valve is normal in structure. Aortic valve regurgitation is  not visualized. No aortic stenosis is present.   6. The inferior vena cava is normal in size with greater than 50%  respiratory variability, suggesting right atrial pressure of 3 mmHg.   Cardiac PET scan 11/02/2023   LV perfusion is abnormal. Defect 1: There is a small defect with moderate reduction in uptake present in the mid anterolateral location(s) that is partially reversible. This is most consistent with artifact given normal wall motion but cannot rule out a small area of infarction and peri-infarct ischemia. Defect 2: There is a small defect with moderate reduction in uptake present in the apical inferior location(s) that is fixed and most lkely represents artifact. There is normal wall motion in the defect area.   Rest left ventricular function is normal. Rest EF: 55%. Stress left ventricular function is normal. Stress EF: 55%. End diastolic cavity size is mildly enlarged.   Myocardial blood flow was computed to be 0.3ml/g/min at rest and 1.32ml/g/min at stress. Global myocardial blood flow reserve was 1.63 and was abnormal.   Coronary calcium  was present on the attenuation correction CT images. Moderate coronary calcifications were present. Coronary calcifications were present in the left anterior descending artery and right coronary artery distribution(s).   Probably normal myocardial perfusion but cannot rule out small area of mid anterolateral scar with peri-infarct ischemia. The study is intermediate risk due to reduced global myocardial blood flow reserve and failure of LVEF to augment with stress, which can be seen with balanced  ischemia.  High resolution chest CT November 05, 2019 1. 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 Stable mild patchy air trapping in both lungs.  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 Fibrotic NSIP is on the differential. Findings are suggestive of an alternative diagnosis (not UIP) per consensus guidelines: Diagnosis of Idiopathic Pulmonary Fibrosis: An Official ATS/ERS/JRS/ALAT Clinical Practice Guideline. Am JINNY [employer] Crit Care Med Vol 198, Iss 5, [phone], Nov 12 2016. 2. Three-vessel coronary atherosclerosis. 3. Small pericardial effusion/thickening, slightly increased. 4. Aortic Atherosclerosis (ICD10-I70.0) and Emphysema (ICD10-J43.9).  Labs/Other Tests and Data Reviewed:    EKG: Personally reviewed the tracing from 06/28/2022 which shows sinus rhythm and bifascicular block (RBBB plus LAFB), borderline QTc 480 ms.  EKG Interpretation Date/Time:    Ventricular Rate:    PR Interval:    QRS Duration:    QT Interval:    QTC Calculation:   R Axis:      Text Interpretation:           Recent Labs:  BMET    Component Value Date/Time   NA 140 10/06/2023 1000   K 4.1 10/06/2023 1000   CL 109 10/06/2023 1000   CO2 27 10/06/2023 1000   GLUCOSE 111 (H) 10/06/2023 1000   BUN 16 10/06/2023 1000   CREATININE 0.73 10/06/2023 1000   CALCIUM  8.4 (L) 10/06/2023 1000   GFRNONAA >60 10/06/2023 1000   GFRAA >60 07/25/2019 0440   GFRAA >60 02/04/2019 0854    LABS from 09/06/2018  Creatinine 0.86, hemoglobin A1c 6.4%, glucose 108, hemoglobin 11.9, potassium 4.7, ferritin 12, normal liver function tests  LABS from 02/02/2018 Total cholesterol 141, HDL 43, LDL 73, triglycerides 125  Labs from 09/25/2019 Hemoglobin A1c 5.6%, hemoglobin 11.5, creatinine 0.99, potassium 4.7, normal liver function  tests  Labs from 09/28/2021 Hemoglobin A1c 6.4%, hemoglobin 10.8, creatinine 0.82, potassium 5.4  09/20/2022 Cholesterol 137, HDL 48, LDL 69, triglycerides 107, hemoglobin A1c 5.4%  10/12/2023 Cholesterol 163, HDL 55, LDL 90, triglycerides 98, hemoglobin A1c 5.8% Hemoglobin 9.4, creatinine 0.73, potassium 4.1, ALT 16  Recent Lipid Panel Lab Results  Component Value Date/Time   CHOL 163 10/12/2023 09:33 AM   TRIG 98 10/12/2023 09:33 AM   HDL 55 10/12/2023 09:33 AM   CHOLHDL 3.0 10/12/2023 09:33 AM   CHOLHDL 5.2 06/29/2015 07:29 PM   LDLCALC 90 10/12/2023 09:33 AM     Wt Readings from Last 3 Encounters:  01/05/24 204 lb 11.2 oz (92.9 kg)  10/06/23 197 lb 1.6 oz (89.4 kg)  10/04/23 197 lb (89.4 kg)     Objective:    Vital Signs:  BP 100/68 (BP Location: Left Arm, Patient Position: Sitting, Cuff Size: Normal)   Pulse 67   Ht 6' 2 (1.88 m)   Wt 204 lb 11.2 oz (92.9 kg)   SpO2 96%   BMI 26.28 kg/m      General: Alert, oriented x3, no distress, somewhat pale Head: no evidence of trauma, PERRL, EOMI, no exophtalmos or lid lag, no myxedema, no xanthelasma; normal ears, nose and oropharynx Neck: normal jugular venous pulsations and no hepatojugular reflux; brisk carotid pulses without delay and no carotid bruits Chest: Bibasilar Velcro-like rales Cardiovascular: normal position and quality of the apical impulse, regular rhythm, normal first and second heart sounds, 1/6 late systolic murmur at the apex only, no diastolic murmurs, rubs or gallops Abdomen: no tenderness or distention, no masses by palpation, no abnormal pulsatility or arterial bruits, normal bowel sounds, no hepatosplenomegaly Extremities: no clubbing, cyanosis or edema; 2+ radial, ulnar and brachial pulses bilaterally; 2+ right femoral, posterior tibial and dorsalis pedis pulses; 2+ left femoral, posterior tibial and dorsalis  pedis pulses; no subclavian or femoral bruits Neurological: grossly nonfocal Psych:  Normal mood and affect     ASSESSMENT & PLAN:    1. Coronary artery disease involving native coronary artery of native heart without angina pectoris   2. Aortic atherosclerosis   3. Nonrheumatic mitral valve regurgitation   4. PVCs (premature ventricular contractions)   5. Prediabetes   6. Hypercholesterolemia   7. Orthostatic hypotension   8. Other iron deficiency anemia   9. Pulmonary fibrosis (HCC)          CAD: He does not have angina pectoris.  He has preserved left ventricular systolic function.  His myocardial blood flow reserve is borderline low, but I do not think this can explain his complaints of fatigue.  I do not think cardiac catheterization is indicated, since he would not require revascularization in the absence of angina.  The focus remains on risk factor mitigation. Aortic atherosclerosis: This has been seen on multiple imaging studies.  Normal caliber aorta with no symptoms of PAD.   MVP/MR: Mild apical late systolic murmur is unchanged.  Mild MR on echo.  No specific therapy indicated. PVCs: RVOT morphology in the past.  These have been present for many many years.  None were heard on exam today.  They seem to have improved with weight loss and I wonder if there were OSA related. (Pre)DM type 2: After normalizing completely, his hemoglobin A1c has increased slightly, as he has gained back a little weight.  Focus on a healthy diet low in sweets and starches with high glycemic index. HLP: On the same medication in the past his LDL cholesterol is better.  He has been taking the pravastatin  in the morning.  Asked him to make sure he takes it with his evening meal or at bedtime. Orthostatic hypotension: He is once again symptomatic.  Advised increasing the dose of midodrine  to 5 mg to be taken once, twice or up to 3 times daily, with a caution that he should not lie horizontally for 4 hours after taking the medication, hence the last dose should be taken a minimum of 4  hours before bedtime. Anemia: In the past, his hemoglobin has improved after receiving intravenous iron.  He likely has malabsorption following gastric bypass.  I suspect the anemia is at least part of the reason for his fatigue. Pulmonary fibrosis: He has prominent Velcro-like rales on physical exam, but his pulmonary function tests are actually not too bad.  Fortunately, this seems to be a static disorder.     Medication Adjustments/Labs and Tests Ordered: Current medicines are reviewed at length with the patient today.  Concerns regarding medicines are outlined above.   Tests Ordered: No orders of the defined types were placed in this encounter.    Medication Changes: Meds ordered this encounter  Medications   midodrine  (PROAMATINE ) 5 MG tablet    Sig: Take 1 tablet (5 mg total) by mouth 2 (two) times daily.    Dispense:  180 tablet    Refill:  3   Patient Instructions  Medication Instructions:  Increase Midodrine  to 5 mg twice a day *Please take Pravastatin  in the evening* *If you need a refill on your cardiac medications before your next appointment, please call your pharmacy*  Lab Work: None ordered If you have labs (blood work) drawn today and your tests are completely normal, you will receive your results only by: MyChart Message (if you have MyChart) OR A paper copy in the mail If  you have any lab test that is abnormal or we need to change your treatment, we will call you to review the results.  Testing/Procedures: None ordered  Follow-Up: At [facility], you and your health needs are our priority.  As part of our continuing mission to provide you with exceptional heart care, our providers are all part of one team.  This team includes your primary Cardiologist (physician) and Advanced Practice Providers or APPs (Physician Assistants and Nurse Practitioners) who all work together to provide you with the care you need, when you need it.  Your next  appointment:   1 year(s)  Provider:   Jerel Balding, MD    We recommend signing up for the patient portal called MyChart.  Sign up information is provided on this After Visit Summary.  MyChart is used to connect with patients for Virtual Visits (Telemedicine).  Patients are able to view lab/test results, encounter notes, upcoming appointments, etc.  Non-urgent messages can be sent to your provider as well.   To learn more about what you can do with MyChart, go to [email or web address].      Signed, Jerel Balding, MD  01/07/2024 4:19 PM    [facility]

## 2024-01-05 NOTE — Patient Instructions (Signed)
 Medication Instructions:  Increase Midodrine  to 5 mg twice a day *Please take Pravastatin  in the evening* *If you need a refill on your cardiac medications before your next appointment, please call your pharmacy*  Lab Work: None ordered If you have labs (blood work) drawn today and your tests are completely normal, you will receive your results only by: MyChart Message (if you have MyChart) OR A paper copy in the mail If you have any lab test that is abnormal or we need to change your treatment, we will call you to review the results.  Testing/Procedures: None ordered  Follow-Up: At [facility], you and your health needs are our priority.  As part of our continuing mission to provide you with exceptional heart care, our providers are all part of one team.  This team includes your primary Cardiologist (physician) and Advanced Practice Providers or APPs (Physician Assistants and Nurse Practitioners) who all work together to provide you with the care you need, when you need it.  Your next appointment:   1 year(s)  Provider:   Jerel Balding, MD    We recommend signing up for the patient portal called MyChart.  Sign up information is provided on this After Visit Summary.  MyChart is used to connect with patients for Virtual Visits (Telemedicine).  Patients are able to view lab/test results, encounter notes, upcoming appointments, etc.  Non-urgent messages can be sent to your provider as well.   To learn more about what you can do with MyChart, go to [email or web address].

## 2024-01-11 DIAGNOSIS — R350 Frequency of micturition: Secondary | ICD-10-CM | POA: Diagnosis not present

## 2024-01-15 DIAGNOSIS — R351 Nocturia: Secondary | ICD-10-CM | POA: Diagnosis not present

## 2024-01-15 DIAGNOSIS — R3915 Urgency of urination: Secondary | ICD-10-CM | POA: Diagnosis not present

## 2024-01-15 DIAGNOSIS — N3944 Nocturnal enuresis: Secondary | ICD-10-CM | POA: Diagnosis not present

## 2024-01-15 DIAGNOSIS — N3020 Other chronic cystitis without hematuria: Secondary | ICD-10-CM | POA: Diagnosis not present

## 2024-01-15 DIAGNOSIS — N401 Enlarged prostate with lower urinary tract symptoms: Secondary | ICD-10-CM | POA: Diagnosis not present

## 2024-01-16 DIAGNOSIS — Z23 Encounter for immunization: Secondary | ICD-10-CM | POA: Diagnosis not present

## 2024-01-18 DIAGNOSIS — R350 Frequency of micturition: Secondary | ICD-10-CM | POA: Diagnosis not present

## 2024-01-18 DIAGNOSIS — M4727 Other spondylosis with radiculopathy, lumbosacral region: Secondary | ICD-10-CM | POA: Diagnosis not present

## 2024-01-18 DIAGNOSIS — M5416 Radiculopathy, lumbar region: Secondary | ICD-10-CM | POA: Diagnosis not present

## 2024-01-18 DIAGNOSIS — G894 Chronic pain syndrome: Secondary | ICD-10-CM | POA: Diagnosis not present

## 2024-01-23 ENCOUNTER — Other Ambulatory Visit: Payer: Self-pay | Admitting: Gastroenterology

## 2024-01-25 DIAGNOSIS — R350 Frequency of micturition: Secondary | ICD-10-CM | POA: Diagnosis not present

## 2024-02-01 DIAGNOSIS — R350 Frequency of micturition: Secondary | ICD-10-CM | POA: Diagnosis not present

## 2024-02-05 NOTE — Progress Notes (Signed)
 Attempted to obtain medical history for pre op call via telephone, unable to reach at this time. HIPAA compliant voicemail message left requesting return call to pre surgical testing department.

## 2024-02-06 DIAGNOSIS — M5416 Radiculopathy, lumbar region: Secondary | ICD-10-CM | POA: Diagnosis not present

## 2024-02-13 ENCOUNTER — Ambulatory Visit (HOSPITAL_COMMUNITY): Admission: RE | Admit: 2024-02-13 | Source: Home / Self Care | Admitting: Gastroenterology

## 2024-02-13 ENCOUNTER — Encounter (HOSPITAL_COMMUNITY): Admission: RE | Payer: Self-pay | Source: Home / Self Care

## 2024-02-13 SURGERY — COLONOSCOPY
Anesthesia: Monitor Anesthesia Care

## 2024-02-14 DIAGNOSIS — N3281 Overactive bladder: Secondary | ICD-10-CM | POA: Diagnosis not present

## 2024-02-19 DIAGNOSIS — F3130 Bipolar disorder, current episode depressed, mild or moderate severity, unspecified: Secondary | ICD-10-CM | POA: Diagnosis not present

## 2024-02-21 DIAGNOSIS — F331 Major depressive disorder, recurrent, moderate: Secondary | ICD-10-CM | POA: Diagnosis not present

## 2024-02-21 DIAGNOSIS — E1140 Type 2 diabetes mellitus with diabetic neuropathy, unspecified: Secondary | ICD-10-CM | POA: Diagnosis not present

## 2024-02-21 DIAGNOSIS — J849 Interstitial pulmonary disease, unspecified: Secondary | ICD-10-CM | POA: Diagnosis not present

## 2024-02-21 DIAGNOSIS — R269 Unspecified abnormalities of gait and mobility: Secondary | ICD-10-CM | POA: Diagnosis not present

## 2024-02-21 DIAGNOSIS — I2510 Atherosclerotic heart disease of native coronary artery without angina pectoris: Secondary | ICD-10-CM | POA: Diagnosis not present

## 2024-02-21 DIAGNOSIS — R059 Cough, unspecified: Secondary | ICD-10-CM | POA: Diagnosis not present

## 2024-02-21 DIAGNOSIS — D509 Iron deficiency anemia, unspecified: Secondary | ICD-10-CM | POA: Diagnosis not present

## 2024-02-21 DIAGNOSIS — I341 Nonrheumatic mitral (valve) prolapse: Secondary | ICD-10-CM | POA: Diagnosis not present

## 2024-02-21 DIAGNOSIS — G894 Chronic pain syndrome: Secondary | ICD-10-CM | POA: Diagnosis not present

## 2024-02-21 DIAGNOSIS — I951 Orthostatic hypotension: Secondary | ICD-10-CM | POA: Diagnosis not present

## 2024-02-21 DIAGNOSIS — N3020 Other chronic cystitis without hematuria: Secondary | ICD-10-CM | POA: Diagnosis not present

## 2024-02-22 DIAGNOSIS — R350 Frequency of micturition: Secondary | ICD-10-CM | POA: Diagnosis not present

## 2024-02-29 ENCOUNTER — Telehealth (HOSPITAL_BASED_OUTPATIENT_CLINIC_OR_DEPARTMENT_OTHER): Payer: Self-pay | Admitting: *Deleted

## 2024-02-29 NOTE — Telephone Encounter (Signed)
° °  Pre-operative Risk Assessment    Patient Name: [name].  DOB: [birth year] MRN: [id number]   Date of last office visit: 01/05/24 DR. CROITORU Date of next office visit: NONE  Request for Surgical Clearance    Procedure:  EXTENSIVE ORAL/FACIAL SURGERY, SMOOTHING OF JAW BONE AND DENTAL IMPLANT PLACEMENT; REMOVAL OF REMAINING TEETH  LEFT VERBAL MESSAGE WITH OFFICE TO CALL BACK WITH HOW MANY TEETH BEING REMOVED.   Date of Surgery:  Clearance TBD                                Surgeon:  DR. PRENTICE ASHWORTH Surgeon's Group or Practice Name:  [facility] Phone number:  [phone] OR [phone] Fax number:  [phone]   Type of Clearance Requested:   - Medical  - Pharmacy:  Hold Aspirin      Type of Anesthesia:  General    Additional requests/questions:    Bonney Niels Jest   02/29/2024, 4:38 PM

## 2024-03-01 NOTE — Telephone Encounter (Signed)
" ° °  Patient Name: [name].  DOB: March 06, 1946 MRN: [id number]  Primary Cardiologist: Jerel Balding, MD  Chart reviewed as part of pre-operative protocol coverage. Pre-op clearance already addressed by colleagues in earlier phone notes. To summarize recommendations:  - Regarding medical clearance: Would estimate that he is at low risk for any serious cardiovascular complications with those procedures and does not need any additional testing or change in treatment. - Dr. Balding - Regarding aspirin : Unless patient is having new cardiac symptoms, would prefer that it be continued through the perioperative period, but if bleeding risk is too high per surgeon, can hold 5-7 days prior to surgery day and restart as soon as possible after.  Will route this bundled recommendation to requesting provider via Epic fax function and remove from pre-op pool. Please call with questions.  Saddie GORMAN Cleaves, NP 03/01/2024, 12:03 PM  "

## 2024-03-01 NOTE — Telephone Encounter (Signed)
 I would estimate that he is at low risk for any serious cardiovascular complications with those procedures and does not need any additional testing or change in treatment (but seriously, ow!  That sounds like it is going to hurt).

## 2024-03-26 ENCOUNTER — Ambulatory Visit: Admitting: Internal Medicine

## 2024-03-26 DIAGNOSIS — J849 Interstitial pulmonary disease, unspecified: Secondary | ICD-10-CM

## 2024-03-26 NOTE — Progress Notes (Unsigned)
 "      OV 03/26/2024  Subjective:  Patient ID: Ronald JONELLE Rosette Mickey., male , DOB: 1945-06-13 , age 79 y.o. , MRN: [id number] , ADDRESS: [address] [place] [address] PCP Ransom Other, MD Patient Care Team: Ransom Other, MD as PCP - General (Internal Medicine) Francyne Headland, MD as PCP - Cardiology (Cardiology)  This Provider for this visit: Treatment Team:  Attending Provider: Geronimo Amel, MD    03/26/2024 -  No chief complaint on file.    HPI Daelon Dunivan. 79 y.o. -    CT Chest data from date: ****  - personally visualized and independently interpreted : *** - my findings are: ***   PFT     Latest Ref Rng & Units 02/06/2019   11:43 AM  PFT Results  FVC-Pre L 4.95  P  FVC-Predicted Pre % 100  P  FVC-Post L 5.00  P  FVC-Predicted Post % 101  P  Pre FEV1/FVC % % 84  P  Post FEV1/FCV % % 87  P  FEV1-Pre L 4.18  P  FEV1-Predicted Pre % 115  P  FEV1-Post L 4.34  P  DLCO uncorrected ml/min/mmHg 23.86  P  DLCO UNC% % 84  P  DLVA Predicted % 86  P  TLC L 7.04  P  TLC % Predicted % 92  P  RV % Predicted % 71  P    P Preliminary result       LAB RESULTS last 96 hours No results found.       has a past medical history of Arthritis, Asthma, BPH (benign prostatic hyperplasia), Bronchitis, Bulge of cervical disc without myelopathy, Chest pain, Complication of anesthesia (04/01/2013), Depression, Insomnia, Low blood pressure, Mitral valve prolapse, Obesity, Pre-diabetes, PVC's (premature ventricular contractions), Recurrent upper respiratory infection (URI), Sleep apnea, and Tremor.   reports that he quit smoking about 33 years ago. His smoking use included cigarettes. He started smoking about 53 years ago. He has a 30 pack-year smoking history. He has never used smokeless tobacco.  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   Procedure: LEFT TOTAL HIP ARTHROPLASTY ANTERIOR APPROACH;  Surgeon: Donnice Car, MD;  Location: [facility];  Service: Orthopedics;  Laterality: Left;   TOTAL HIP REVISION  04/22/2011   Procedure: TOTAL HIP REVISION;  Surgeon: Dempsey LULLA Moan, MD;  Location: [facility];  Service: Orthopedics;  Laterality: Right;    Allergies[1]  Immunization History  Administered Date(s) Administered   Fluzone Influenza virus vaccine,trivalent (IIV3), split virus 12/03/2018, 12/27/2019   INFLUENZA, HIGH DOSE SEASONAL PF 12/05/2016, 12/20/2017, 12/03/2018   Influenza Whole 12/13/2011   Influenza,inj,Quad PF,6+ Mos 12/20/2012, 12/01/2014   Influenza,inj,quad, With Preservative 12/12/2016   Moderna Sars-Covid-2 Vaccination 04/15/2019, 05/14/2019, 11/22/2019  Pneumococcal Conjugate-13 12/22/2014   Pneumococcal Polysaccharide-23 11/25/2008, 12/20/2017   Tdap 08/01/2017    Family History  Problem Relation Age of Onset   Heart disease Father    Anxiety disorder Mother    Asthma Maternal Grandmother     Current Medications[2]      Objective:   There were no vitals filed for this visit.  Estimated body mass index is 26.28 kg/m as calculated from the following:   Height as of 01/05/24: 6' 2 (1.88 m).   Weight as of 01/05/24: 204 lb 11.2 oz (92.9 kg).  @WEIGHTCHANGE @  There were no vitals filed for this visit.   Physical Exam   General: No distress. *** O2 at rest: *** Cane present: *** Sitting in wheel chair: *** Frail: *** Obese:  *** Neuro: Alert and Oriented x 3. GCS 15. Speech normal Psych: Pleasant Resp:  Barrel Chest - ***.  Wheeze - ***, Crackles - ***, No overt respiratory distress CVS: Normal heart sounds. Murmurs - *** Ext: Stigmata of Connective Tissue Disease - *** HEENT: Normal upper airway. PEERL +. No post nasal drip        Assessment/     Assessment & Plan ILD (interstitial lung disease) (HCC)    PLAN There are no Patient Instructions on file for this visit.    FOLLOWUP    No follow-ups on file.    SIGNATURE    Dr. Dorethia Cave, M.D., F.C.C.P,  Pulmonary and Critical Care Medicine Staff Physician, [facility] Director - Interstitial Lung Disease  Program  Pulmonary Fibrosis [facility] at [facility] [place], [place], [address]  Pager: [phone], If no answer or between  15:00h - 7:00h: call [phone] Telephone: [phone]  12:21 PM 03/26/2024   Moderate Complexity MDM OFFICE  2021 E/M guidelines, first released in 2021, with minor revisions added in 2023 and 2024 Must meet the requirements for 2 out of 3 dimensions to qualify.    Number and complexity of problems addressed Amount and/or complexity of data reviewed Risk of complications and/or morbidity  One or more chronic illness with mild exacerbation, OR progression, OR  side effects of treatment  Two or more stable chronic illnesses  One undiagnosed new problem with uncertain prognosis  One acute illness with systemic symptoms   One Acute complicated injury Must meet the requirements for 1 of 3 of the categories)  Category 1: Tests and documents, historian  Any combination of 3 of the following:  Assessment requiring an independent historian  Review of prior external note(s) from each unique source  Review of results of each unique test  Ordering of each unique test    Category 2: Interpretation of tests   Independent interpretation of a test  performed by another physician/other qualified health care professional (not separately reported)  Category 3: Discuss management/tests  Discussion of management or test interpretation with external physician/other qualified health care professional/appropriate source (not separately reported) Moderate risk of morbidity from additional diagnostic testing or treatment Examples only:  Prescription drug management  Decision regarding minor surgery with identfied patient or procedure risk factors  Decision regarding elective major surgery without identified patient or procedure risk factors  Diagnosis or treatment significantly limited by social determinants of health             HIGh Complexity  OFFICE   2021 E/M guidelines, first released in 2021, with minor revisions added in 2023. Must meet the requirements  for 2 out of 3 dimensions to qualify.    Number and complexity of problems addressed Amount and/or complexity of data reviewed Risk of complications and/or morbidity  Severe exacerbation of chronic illness  Acute or chronic illnesses that may pose a threat to life or bodily function, e.g., multiple trauma, acute MI, pulmonary embolus, severe respiratory distress, progressive rheumatoid arthritis, psychiatric illness with potential threat to self or others, peritonitis, acute renal failure, abrupt change in neurological status Must meet the requirements for 2 of 3 of the categories)  Category 1: Tests and documents, historian  Any combination of 3 of the following:  Assessment requiring an independent historian  Review of prior external note(s) from each unique source  Review of results of each unique test  Ordering of each unique test    Category 2: Interpretation of tests    Independent interpretation of a test performed by another physician/other qualified health care professional (not separately reported)  Category 3: Discuss management/tests  Discussion  of management or test interpretation with external physician/other qualified health care professional/appropriate source (not separately reported)  HIGH risk of morbidity from additional diagnostic testing or treatment Examples only:  Drug therapy requiring intensive monitoring for toxicity  Decision for elective major surgery with identified pateint or procedure risk factors  Decision regarding hospitalization or escalation of level of care  Decision for DNR or to de-escalate care   Parenteral controlled  substances            LEGEND - Independent interpretation involves the interpretation of a test for which there is a CPT code, and an interpretation or report is customary. When a review and interpretation of a test is performed and documented by the provider, but not separately reported (billed), then this would represent an independent interpretation. This report does not need to conform to the usual standards of a complete report of the test. This does not include interpretation of tests that do not have formal reports such as a complete blood count with differential and blood cultures. Examples would include reviewing a chest radiograph and documenting in the medical record an interpretation, but not separately reporting (billing) the interpretation of the chest radiograph.   An appropriate source includes professionals who are not health care professionals but may be involved in the management of the patient, such as a clinical research associate, upper officer, case manager or teacher, and does not include discussion with family or informal caregivers.    - SDOH: SDOH are the conditions in the environments where people are born, live, learn, work, play, worship, and age that affect a wide range of health, functioning, and quality-of-life outcomes and risks. (e.g., housing, food insecurity, transportation, etc.). SDOH-related Z codes ranging from Z55-Z65 are the ICD-10-CM diagnosis codes used to  document SDOH data Z55 - Problems related to education and literacy Z56 - Problems related to employment and unemployment Z57 - Occupational exposure to risk factors Z58 - Problems related to physical environment Z59 - Problems related to housing and economic circumstances [phone] - Problems related to social environment [phone] - Problems related to upbringing [phone] - Other problems related to primary support group, including family circumstances Z64 - Problems related to certain psychosocial circumstances Z65 - Problems related to other psychosocial circumstances    [1]  Allergies Allergen Reactions   Breo Ellipta [Fluticasone  Furoate-Vilanterol] Other (See Comments)    Thrush   Demerol  [Meperidine ] Itching   Nsaids Nausea Only    Gastric bypass  [2]  Current Outpatient Medications:  acetaminophen  (TYLENOL ) 325 MG tablet, Take 650 mg by mouth every 6 (six) hours as needed., Disp: , Rfl:    albuterol  (VENTOLIN  HFA) 108 (90 Base) MCG/ACT inhaler, TAKE 2 PUFFS BY MOUTH EVERY 6 HOURS AS NEEDED FOR WHEEZE OR SHORTNESS OF BREATH (Patient not taking: Reported on 01/05/2024), Disp: 4 g, Rfl: 3   alfuzosin  (UROXATRAL ) 10 MG 24 hr tablet, Take 10 mg by mouth daily., Disp: , Rfl:    aspirin  EC 81 MG tablet, Take 81 mg by mouth 2 (two) times daily., Disp: , Rfl:    cyanocobalamin  (VITAMIN B12) 1000 MCG tablet, Take 1 tablet (1,000 mcg total) by mouth daily., Disp: 90 tablet, Rfl: 3   dutasteride  (AVODART ) 0.5 MG capsule, Take 0.5 mg by mouth daily., Disp: , Rfl:    Eszopiclone 3 MG TABS, Take 3 mg by mouth at bedtime., Disp: , Rfl:    finasteride  (PROSCAR ) 5 MG tablet, Take 5 mg by mouth daily., Disp: , Rfl:    gabapentin  (NEURONTIN ) 400 MG capsule, Take 300 mg by mouth 3 (three) times daily. (Patient taking differently: Take 400 mg by mouth 3 (three) times daily.), Disp: , Rfl:    LORazepam  (ATIVAN ) 0.5 MG tablet, Take 0.5 mg by mouth in the morning and at bedtime., Disp: , Rfl:    meclizine   (ANTIVERT ) 25 MG tablet, Take 1 tablet (25 mg total) by mouth 3 (three) times daily as needed for dizziness., Disp: 30 tablet, Rfl: 0   methocarbamol  (ROBAXIN ) 750 MG tablet, Take 750 mg by mouth 3 (three) times daily., Disp: , Rfl:    methylphenidate  27 MG PO TB24, Take 27 mg by mouth daily., Disp: , Rfl:    midodrine  (PROAMATINE ) 5 MG tablet, Take 1 tablet (5 mg total) by mouth 2 (two) times daily., Disp: 180 tablet, Rfl: 3   Multiple Vitamins-Minerals (CENTRUM SILVER ADULT 50+ PO), Take 1 tablet by mouth daily., Disp: , Rfl:    omeprazole  (PRILOSEC) 20 MG capsule, Take 20 mg by mouth every morning., Disp: , Rfl:    ondansetron  (ZOFRAN -ODT) 4 MG disintegrating tablet, Take 1 tablet (4 mg total) by mouth every 8 (eight) hours as needed. (Patient not taking: Reported on 01/05/2024), Disp: 20 tablet, Rfl: 0   oxybutynin (DITROPAN) 5 MG tablet, Take 5 mg by mouth at bedtime., Disp: , Rfl:    Oxycodone  HCl 10 MG TABS, Take 10 mg by mouth every 6 (six) hours as needed (pain)., Disp: , Rfl:    pravastatin  (PRAVACHOL ) 80 MG tablet, Take 1 tablet (80 mg total) by mouth every evening., Disp: 90 tablet, Rfl: 3   sertraline  (ZOLOFT ) 100 MG tablet, Take 100 mg by mouth every morning., Disp: , Rfl:    triamcinolone  cream (KENALOG ) 0.1 %, Apply 1 Application topically daily as needed., Disp: , Rfl:    triamcinolone  lotion (KENALOG ) 0.1 %, Apply 1 Application topically daily as needed., Disp: , Rfl:    trimethoprim (TRIMPEX) 100 MG tablet, Take 100 mg by mouth daily., Disp: , Rfl:    venlafaxine  XR (EFFEXOR -XR) 75 MG 24 hr capsule, Take 150 mg by mouth every morning., Disp: , Rfl:   "

## 2024-03-31 ENCOUNTER — Other Ambulatory Visit: Payer: Self-pay | Admitting: Hematology and Oncology

## 2024-03-31 DIAGNOSIS — D649 Anemia, unspecified: Secondary | ICD-10-CM

## 2024-03-31 DIAGNOSIS — D500 Iron deficiency anemia secondary to blood loss (chronic): Secondary | ICD-10-CM

## 2024-03-31 NOTE — Progress Notes (Unsigned)
 Rescheduled to later in the day

## 2024-04-01 ENCOUNTER — Inpatient Hospital Stay

## 2024-04-01 ENCOUNTER — Inpatient Hospital Stay: Admitting: Hematology and Oncology

## 2024-04-01 ENCOUNTER — Inpatient Hospital Stay: Attending: Hematology and Oncology

## 2024-04-01 VITALS — BP 138/74 | HR 76 | Temp 97.1°F | Resp 14 | Wt 210.6 lb

## 2024-04-01 DIAGNOSIS — D649 Anemia, unspecified: Secondary | ICD-10-CM

## 2024-04-01 DIAGNOSIS — D500 Iron deficiency anemia secondary to blood loss (chronic): Secondary | ICD-10-CM

## 2024-04-01 DIAGNOSIS — Z87891 Personal history of nicotine dependence: Secondary | ICD-10-CM | POA: Insufficient documentation

## 2024-04-01 DIAGNOSIS — D509 Iron deficiency anemia, unspecified: Secondary | ICD-10-CM | POA: Diagnosis present

## 2024-04-01 LAB — CMP (CANCER CENTER ONLY)
ALT: 53 U/L — ABNORMAL HIGH (ref 0–44)
AST: 50 U/L — ABNORMAL HIGH (ref 15–41)
Albumin: 4 g/dL (ref 3.5–5.0)
Alkaline Phosphatase: 89 U/L (ref 38–126)
Anion gap: 11 (ref 5–15)
BUN: 16 mg/dL (ref 8–23)
CO2: 23 mmol/L (ref 22–32)
Calcium: 8.6 mg/dL — ABNORMAL LOW (ref 8.9–10.3)
Chloride: 107 mmol/L (ref 98–111)
Creatinine: 1 mg/dL (ref 0.61–1.24)
GFR, Estimated: >60 mL/min
Glucose, Bld: 88 mg/dL (ref 70–99)
Potassium: 4.2 mmol/L (ref 3.5–5.1)
Sodium: 141 mmol/L (ref 135–145)
Total Bilirubin: 0.3 mg/dL (ref 0.0–1.2)
Total Protein: 7.1 g/dL (ref 6.5–8.1)

## 2024-04-01 LAB — CBC WITH DIFFERENTIAL (CANCER CENTER ONLY)
Abs Immature Granulocytes: 0.1 K/uL — ABNORMAL HIGH (ref 0.00–0.07)
Basophils Absolute: 0.1 K/uL (ref 0.0–0.1)
Basophils Relative: 1 %
Eosinophils Absolute: 0.8 K/uL — ABNORMAL HIGH (ref 0.0–0.5)
Eosinophils Relative: 8 %
HCT: 33.1 % — ABNORMAL LOW (ref 39.0–52.0)
Hemoglobin: 10.6 g/dL — ABNORMAL LOW (ref 13.0–17.0)
Immature Granulocytes: 1 %
Lymphocytes Relative: 13 %
Lymphs Abs: 1.4 K/uL (ref 0.7–4.0)
MCH: 31.5 pg (ref 26.0–34.0)
MCHC: 32 g/dL (ref 30.0–36.0)
MCV: 98.5 fL (ref 80.0–100.0)
Monocytes Absolute: 0.9 K/uL (ref 0.1–1.0)
Monocytes Relative: 8 %
Neutro Abs: 7.4 K/uL (ref 1.7–7.7)
Neutrophils Relative %: 69 %
Platelet Count: 215 K/uL (ref 150–400)
RBC: 3.36 MIL/uL — ABNORMAL LOW (ref 4.22–5.81)
RDW: 12.5 % (ref 11.5–15.5)
WBC Count: 10.7 K/uL — ABNORMAL HIGH (ref 4.0–10.5)
nRBC: 0 % (ref 0.0–0.2)

## 2024-04-01 LAB — RETIC PANEL
Immature Retic Fract: 11.6 % (ref 2.3–15.9)
RBC.: 3.37 MIL/uL — ABNORMAL LOW (ref 4.22–5.81)
Retic Count, Absolute: 38.4 K/uL (ref 19.0–186.0)
Retic Ct Pct: 1.1 % (ref 0.4–3.1)
Reticulocyte Hemoglobin: 34.5 pg

## 2024-04-01 LAB — IRON AND IRON BINDING CAPACITY (CC-WL,HP ONLY)
Iron: 51 ug/dL (ref 45–182)
Saturation Ratios: 18 % (ref 17.9–39.5)
TIBC: 279 ug/dL (ref 250–450)
UIBC: 227 ug/dL

## 2024-04-01 LAB — VITAMIN B12: Vitamin B-12: >4000 pg/mL — ABNORMAL HIGH (ref 180–914)

## 2024-04-01 LAB — FERRITIN: Ferritin: 130 ng/mL (ref 24–336)

## 2024-04-01 NOTE — Progress Notes (Unsigned)
 " [facility] [phone]   [phone]  PROGRESS NOTE  Patient Care Team: Ransom Other, MD as PCP - General (Internal Medicine) Francyne Headland, MD as PCP - Cardiology (Cardiology)  Hematological/Oncological History # Normocytic Anemia # Iron Deficiency Anemia 10/06/2023: establish care with Dr. Federico   Interval History:  [name]. 79 y.o. male with medical history significant for IDA who presents for a follow up visit. The patient's last visit was on 10/06/2023. In the interim since the last visit he received a dose of monoferric  on 10/17/2023.   On exam today [name] reports he has been well overall with them since our last visit.  He reports he tolerated his Monoferric  well in August 2025.  He reports that he has gained 13 pounds after having discontinued his metformin .  He reports that he does currently have a sore throat and thinks that may be the source of his elevated white blood cell count.  His primary symptom continues to be fatigue.  His appetite remains poor.  He notes that he did have an EGD performed about 3 months ago but the colonoscopy had to be discontinued due to poor prep.  A 2-day prep was recommended but he did not wish to go through with that.  He reports has not had any trouble with bleeding, bruising, or dark stools.  He reports that otherwise he is at his baseline level of health and has no additional questions concerns or complaints today.  He denies any lightheadedness, dizziness, shortness of breath.  A full 10 point ROS is otherwise negative.  MEDICAL HISTORY:  Past Medical History:  Diagnosis Date   Arthritis    Asthma    anxiety, tree/grass pollen   BPH (benign prostatic hyperplasia)    Bronchitis    hx of   Bulge of cervical disc without myelopathy    Chest pain    a. 06/2015 MV: EF 56%, no ischemia/infarct.   Complication of anesthesia 04/01/2013   no complications but just says he typically needs more  than expected to anesthetize; needs CPAP (setting 10) in recovery   Depression    ADD   Insomnia    Low blood pressure    Mitral valve prolapse    a. 06/2015 Echo: EF 65-70%, mild LVH, no rwma, mild MVP of ant leaflet and mild to mod posteriorly directed MR, mildly dil La.   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   JOINT REPLACEMENT     Left, Right Knee, Right hip   KNEE ARTHROSCOPY     right x 3-4, left x 2   KNEE ARTHROTOMY  ~1991   right   LUMBAR FUSION  04/03/2013   Dr Joshua, L5-S1   POLYPECTOMY     vocal cords 1992   REVERSE SHOULDER ARTHROPLASTY Right 09/27/2021   Procedure: REVERSE SHOULDER ARTHROPLASTY;  Surgeon: Kay Kemps, MD;  Location: [facility];  Service: Orthopedics;  Laterality: Right;  with  ISB   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   Procedure: LEFT TOTAL HIP ARTHROPLASTY ANTERIOR APPROACH;  Surgeon: Donnice Car, MD;  Location: [facility];  Service: Orthopedics;  Laterality: Left;   TOTAL HIP REVISION  04/22/2011   Procedure: TOTAL HIP REVISION;  Surgeon: Dempsey LULLA Moan, MD;  Location: [facility];  Service: Orthopedics;  Laterality: Right;    SOCIAL HISTORY: Social History   Socioeconomic History   Marital status: Married    Spouse name: Not on file   Number of children: Not on file   Years of education: college   Highest education level: Not on file  Occupational History   Occupation: Retired Teacher, Music: [employer]  Tobacco Use   Smoking status: Former    Current packs/day: 0.00    Average packs/day: 1.5 packs/day for 20.0 years (30.0 ttl  pk-yrs)    Types: Cigarettes    Start date: 04/17/1970    Quit date: 04/17/1990    Years since quitting: 33.9   Smokeless tobacco: Never  Vaping Use   Vaping status: Never Used  Substance and Sexual Activity   Alcohol use: No   Drug use: No   Sexual activity: Not on file  Other Topics Concern   Not on file  Social History Narrative   Patient works for [employer]. Patient has masters degree. Patient is married.   Are you right handed or left handed? right   Are you currently employed ?    What is your current occupation?retired   Do you live at home alone?   Who lives with you? wife   What type of home do you live in: 1 story or 2 story? one    Caffeine 1 cup 3 sodas   Social Drivers of Health   Tobacco Use: Medium Risk (03/19/2024)   Received from [facility]   Patient History    Smoking Tobacco Use: Former    Smokeless Tobacco Use: Never    Passive Exposure: Not on file  Financial Resource Strain: Low Risk (09/29/2023)   Received from [facility]   Overall Financial Resource Strain (CARDIA)    How hard is it for you to pay for the very basics like food, housing, medical care, and heating?: Not hard at all  Food Insecurity: No Food Insecurity (10/06/2023)   Epic    Worried About Radiation Protection Practitioner of Food in the Last Year: Never true    Ran Out of Food in the Last Year: Never true  Transportation Needs: No Transportation Needs (10/06/2023)   Epic    Lack of Transportation (Medical): No    Lack of Transportation (Non-Medical): No  Physical Activity: Not on file  Stress: Not on file  Social Connections: Unknown (07/27/2021)   [facility]   Social Network    Social Network: Not on file  Intimate Partner Violence: Not At Risk (10/06/2023)   Epic    Fear of Current or Ex-Partner: No    Emotionally Abused: No    Physically Abused: No    Sexually Abused: No  Depression (PHQ2-9): Medium Risk (10/06/2023)   Depression (PHQ2-9)    PHQ-2 Score: 7  Alcohol Screen: Not on  file  Housing: Unknown (10/06/2023)   Epic    Unable to Pay for Housing in the Last Year: No    Number of Times Moved in the Last Year: Not on file    Homeless in the Last Year: No  Utilities: Not At Risk (10/06/2023)   Epic  Threatened with loss of utilities: No  Health Literacy: Not on file    FAMILY HISTORY: Family History  Problem Relation Age of Onset   Heart disease Father    Anxiety disorder Mother    Asthma Maternal Grandmother     ALLERGIES:  is allergic to breo ellipta [fluticasone  furoate-vilanterol], demerol  [meperidine ], and nsaids.  MEDICATIONS:  Current Outpatient Medications  Medication Sig Dispense Refill   acetaminophen  (TYLENOL ) 325 MG tablet Take 650 mg by mouth every 6 (six) hours as needed.     albuterol  (VENTOLIN  HFA) 108 (90 Base) MCG/ACT inhaler TAKE 2 PUFFS BY MOUTH EVERY 6 HOURS AS NEEDED FOR WHEEZE OR SHORTNESS OF BREATH (Patient not taking: Reported on 01/05/2024) 4 g 3   alfuzosin  (UROXATRAL ) 10 MG 24 hr tablet Take 10 mg by mouth daily.     aspirin  EC 81 MG tablet Take 81 mg by mouth 2 (two) times daily.     cyanocobalamin  (VITAMIN B12) 1000 MCG tablet Take 1 tablet (1,000 mcg total) by mouth daily. 90 tablet 3   dutasteride  (AVODART ) 0.5 MG capsule Take 0.5 mg by mouth daily.     Eszopiclone 3 MG TABS Take 3 mg by mouth at bedtime.     finasteride  (PROSCAR ) 5 MG tablet Take 5 mg by mouth daily.     gabapentin  (NEURONTIN ) 400 MG capsule Take 300 mg by mouth 3 (three) times daily. (Patient taking differently: Take 400 mg by mouth 3 (three) times daily.)     LORazepam  (ATIVAN ) 0.5 MG tablet Take 0.5 mg by mouth in the morning and at bedtime.     meclizine  (ANTIVERT ) 25 MG tablet Take 1 tablet (25 mg total) by mouth 3 (three) times daily as needed for dizziness. 30 tablet 0   methocarbamol  (ROBAXIN ) 750 MG tablet Take 750 mg by mouth 3 (three) times daily.     methylphenidate  27 MG PO TB24 Take 27 mg by mouth daily.     midodrine  (PROAMATINE ) 5 MG  tablet Take 1 tablet (5 mg total) by mouth 2 (two) times daily. 180 tablet 3   Multiple Vitamins-Minerals (CENTRUM SILVER ADULT 50+ PO) Take 1 tablet by mouth daily.     omeprazole  (PRILOSEC) 20 MG capsule Take 20 mg by mouth every morning.     ondansetron  (ZOFRAN -ODT) 4 MG disintegrating tablet Take 1 tablet (4 mg total) by mouth every 8 (eight) hours as needed. (Patient not taking: Reported on 01/05/2024) 20 tablet 0   oxybutynin (DITROPAN) 5 MG tablet Take 5 mg by mouth at bedtime.     Oxycodone  HCl 10 MG TABS Take 10 mg by mouth every 6 (six) hours as needed (pain).     pravastatin  (PRAVACHOL ) 80 MG tablet Take 1 tablet (80 mg total) by mouth every evening. 90 tablet 3   sertraline  (ZOLOFT ) 100 MG tablet Take 100 mg by mouth every morning.     triamcinolone  cream (KENALOG ) 0.1 % Apply 1 Application topically daily as needed.     triamcinolone  lotion (KENALOG ) 0.1 % Apply 1 Application topically daily as needed.     trimethoprim (TRIMPEX) 100 MG tablet Take 100 mg by mouth daily.     venlafaxine  XR (EFFEXOR -XR) 75 MG 24 hr capsule Take 150 mg by mouth every morning.     No current facility-administered medications for this visit.    REVIEW OF SYSTEMS:   Constitutional: ( - ) fevers, ( - )  chills , ( - ) night sweats Eyes: ( - ) blurriness of vision, ( - )  double vision, ( - ) watery eyes Ears, nose, mouth, throat, and face: ( - ) mucositis, ( - ) sore throat Respiratory: ( - ) cough, ( - ) dyspnea, ( - ) wheezes Cardiovascular: ( - ) palpitation, ( - ) chest discomfort, ( - ) lower extremity swelling Gastrointestinal:  ( - ) nausea, ( - ) heartburn, ( - ) change in bowel habits Skin: ( - ) abnormal skin rashes Lymphatics: ( - ) new lymphadenopathy, ( - ) easy bruising Neurological: ( - ) numbness, ( - ) tingling, ( - ) new weaknesses Behavioral/Psych: ( - ) mood change, ( - ) new changes  All other systems were reviewed with the patient and are negative.  PHYSICAL  EXAMINATION:  Vitals:   04/01/24 1144  BP: 138/74  Pulse: 76  Resp: 14  Temp: (!) 97.1 F (36.2 C)   Filed Weights   04/01/24 1144  Weight: 210 lb 9.6 oz (95.5 kg)    GENERAL: Well-appearing elderly Caucasian male, alert, no distress and comfortable SKIN: skin color, texture, turgor are normal, no rashes or significant lesions EYES: conjunctiva are pink and non-injected, sclera clear LUNGS: clear to auscultation and percussion with normal breathing effort HEART: regular rate & rhythm and no murmurs and no lower extremity edema Musculoskeletal: no cyanosis of digits and no clubbing  PSYCH: alert & oriented x 3, fluent speech NEURO: no focal motor/sensory deficits  LABORATORY DATA:  I have reviewed the data as listed    Latest Ref Rng & Units 04/01/2024   10:57 AM 10/06/2023   10:00 AM 04/01/2023    1:00 PM  CBC  WBC 4.0 - 10.5 K/uL 10.7  7.2  6.3   Hemoglobin 13.0 - 17.0 g/dL 89.3  9.4  89.0   Hematocrit 39.0 - 52.0 % 33.1  29.6  35.3   Platelets 150 - 400 K/uL 215  267  273        Latest Ref Rng & Units 04/01/2024   10:57 AM 10/12/2023    9:34 AM 10/06/2023   10:00 AM  CMP  Glucose 70 - 99 mg/dL 88   888   BUN 8 - 23 mg/dL 16   16   Creatinine 9.38 - 1.24 mg/dL 8.99   9.26   Sodium 864 - 145 mmol/L 141   140   Potassium 3.5 - 5.1 mmol/L 4.2   4.1   Chloride 98 - 111 mmol/L 107   109   CO2 22 - 32 mmol/L 23   27   Calcium  8.9 - 10.3 mg/dL 8.6   8.4   Total Protein 6.5 - 8.1 g/dL 7.1  6.3  6.7   Total Bilirubin 0.0 - 1.2 mg/dL 0.3  <9.7  0.3   Alkaline Phos 38 - 126 U/L 89  80  70   AST 15 - 41 U/L 50  23  17   ALT 0 - 44 U/L 53  16  15     Lab Results  Component Value Date   MPROTEIN Not Observed 10/06/2023   Lab Results  Component Value Date   KPAFRELGTCHN 31.7 (H) 10/06/2023   LAMBDASER 27.4 (H) 10/06/2023   KAPLAMBRATIO 1.16 10/06/2023    RADIOGRAPHIC STUDIES: No results found.  ASSESSMENT & PLAN [name]. 79 y.o. male with medical  history significant for IDA who presents for a follow up visit.  # Iron Deficiency Anemia 2/2 to GI Bleeding (Suspected) -- Findings are consistent with iron deficiency anemia secondary  to GI bleeding.  --Encouraged patient to follow-up with GI for continued monitoring/evaluation.  --patient last had colonoscopy in 2016 with Jerrell Sol.  Had EGD approximately 3 months ago and reportedly colonoscopy had to be canceled due to poor prep. --labs today show WBC 10.7, Hgb 10.6, MCV 98.5, Plt 215.  --Continue ferrous sulfate  325 mg daily with a source of vitamin C --We will plan to proceed with IV iron therapy in order to help bolster the patient's blood counts if they are persistently low  --Discussed the role of bone marrow biopsy.  Patient would like to hold on that at this time. --Plan for return to clinic in 3 months or sooner if IV iron is required.   No orders of the defined types were placed in this encounter.   All questions were answered. The patient knows to call the clinic with any problems, questions or concerns.  A total of more than 30 minutes were spent on this encounter with face-to-face time and non-face-to-face time, including preparing to see the patient, ordering tests and/or medications, counseling the patient and coordination of care as outlined above.   Norleen IVAR Kidney, MD Department of Hematology/Oncology [facility] Phone: [phone] Pager: [phone] Email: [email or web address]  04/02/2024 9:44 AM  "

## 2024-04-02 ENCOUNTER — Encounter: Payer: Self-pay | Admitting: Adult Health

## 2024-04-02 ENCOUNTER — Ambulatory Visit: Payer: Self-pay

## 2024-04-02 NOTE — Telephone Encounter (Signed)
-----   Message from Norleen Kidney, MD sent at 04/02/2024  9:45 AM EST ----- Please let Mr. Hallenbeck know that his iron levels were normal today.  His hemoglobin remains below target, at 10.7.  A perfect hemoglobin of his age would be 13.0.  Please let him know that there are 2  options.  We can plan to monitor and observe and see him back in 3 months time, or we could pursue a bone marrow biopsy as we discussed yesterday.  If he would like to pursue the bone marrow biopsy  we will schedule that promptly.  Otherwise we will see him back in 3 months to repeat his labs.

## 2024-04-02 NOTE — Telephone Encounter (Signed)
 TC to pt to forward the following message from Dr Federico  Please let [name] know that his iron levels were normal today.  His hemoglobin remains below target, at 10.7.  A perfect hemoglobin of his age would be 13.0.  Please let him know that there are 2 options.  We can plan to monitor and observe and see him back in 3 months time, or we could pursue a bone marrow biopsy as we discussed yesterday.  If he would like to pursue the bone marrow biopsy we will schedule that promptly.  Otherwise we will see him back in 3 months to repeat his labs. Left message for pt to return call to receive his above lab results.

## 2024-04-03 LAB — METHYLMALONIC ACID, SERUM: Methylmalonic Acid, Quantitative: 167 nmol/L (ref 0–378)

## 2024-04-09 ENCOUNTER — Ambulatory Visit: Admitting: Internal Medicine

## 2024-05-01 ENCOUNTER — Ambulatory Visit: Admitting: Pulmonary Disease

## 2024-07-01 ENCOUNTER — Inpatient Hospital Stay: Admitting: Hematology and Oncology

## 2024-07-01 ENCOUNTER — Inpatient Hospital Stay
